# Patient Record
Sex: Male | Born: 1935
Health system: Southern US, Community
[De-identification: ages and names within clinical notes are randomized; demographics above are authoritative.]

## PROBLEM LIST (undated history)

## (undated) DIAGNOSIS — D509 Iron deficiency anemia, unspecified: Secondary | ICD-10-CM

## (undated) DIAGNOSIS — G4730 Sleep apnea, unspecified: Secondary | ICD-10-CM

## (undated) DIAGNOSIS — N2889 Other specified disorders of kidney and ureter: Secondary | ICD-10-CM

## (undated) DIAGNOSIS — K219 Gastro-esophageal reflux disease without esophagitis: Secondary | ICD-10-CM

## (undated) DIAGNOSIS — I272 Pulmonary hypertension, unspecified: Secondary | ICD-10-CM

## (undated) DIAGNOSIS — I509 Heart failure, unspecified: Secondary | ICD-10-CM

## (undated) DIAGNOSIS — I4891 Unspecified atrial fibrillation: Secondary | ICD-10-CM

## (undated) DIAGNOSIS — I10 Essential (primary) hypertension: Secondary | ICD-10-CM

## (undated) DIAGNOSIS — I340 Nonrheumatic mitral (valve) insufficiency: Secondary | ICD-10-CM

## (undated) DIAGNOSIS — I499 Cardiac arrhythmia, unspecified: Secondary | ICD-10-CM

## (undated) DIAGNOSIS — Z5189 Encounter for other specified aftercare: Secondary | ICD-10-CM

## (undated) DIAGNOSIS — H269 Unspecified cataract: Secondary | ICD-10-CM

## (undated) HISTORY — PX: CHOLECYSTECTOMY: SHX55

## (undated) HISTORY — DX: Essential (primary) hypertension: I10

## (undated) HISTORY — DX: Encounter for other specified aftercare: Z51.89

## (undated) HISTORY — DX: Pulmonary hypertension, unspecified: I27.20

## (undated) HISTORY — DX: Unspecified cataract: H26.9

## (undated) HISTORY — PX: EYE SURGERY: SHX253

## (undated) HISTORY — DX: Nonrheumatic mitral (valve) insufficiency: I34.0

## (undated) HISTORY — PX: COLONOSCOPY: SHX174

## (undated) HISTORY — DX: Iron deficiency anemia, unspecified: D50.9

## (undated) HISTORY — PX: CARDIAC CATHETERIZATION: SHX172

## (undated) HISTORY — PX: TONSILLECTOMY: SUR1361

## (undated) HISTORY — DX: Gastro-esophageal reflux disease without esophagitis: K21.9

## (undated) HISTORY — DX: Unspecified atrial fibrillation: I48.91

## (undated) HISTORY — DX: Other specified disorders of kidney and ureter: N28.89

---

## 2000-05-17 HISTORY — PX: NEPHRECTOMY: SHX65

## 2000-06-12 ENCOUNTER — Emergency Department (HOSPITAL_COMMUNITY): Admission: EM | Admit: 2000-06-12 | Discharge: 2000-06-12 | Payer: Self-pay | Admitting: Emergency Medicine

## 2000-08-09 ENCOUNTER — Encounter: Admission: RE | Admit: 2000-08-09 | Discharge: 2000-08-09 | Payer: Self-pay | Admitting: Urology

## 2000-08-09 ENCOUNTER — Encounter: Payer: Self-pay | Admitting: Urology

## 2001-06-28 ENCOUNTER — Ambulatory Visit (HOSPITAL_COMMUNITY): Admission: RE | Admit: 2001-06-28 | Discharge: 2001-06-28 | Payer: Self-pay | Admitting: Gastroenterology

## 2012-01-10 ENCOUNTER — Other Ambulatory Visit (HOSPITAL_COMMUNITY): Payer: Self-pay | Admitting: Internal Medicine

## 2012-01-10 DIAGNOSIS — R062 Wheezing: Secondary | ICD-10-CM

## 2012-01-24 ENCOUNTER — Other Ambulatory Visit (HOSPITAL_COMMUNITY): Payer: Self-pay | Admitting: Internal Medicine

## 2012-01-24 DIAGNOSIS — R062 Wheezing: Secondary | ICD-10-CM

## 2012-01-27 ENCOUNTER — Ambulatory Visit (HOSPITAL_COMMUNITY)
Admission: RE | Admit: 2012-01-27 | Discharge: 2012-01-27 | Disposition: A | Discharge status: Home / Self Care | Payer: Medicare Other | Source: Ambulatory Visit | Attending: Internal Medicine | Admitting: Internal Medicine

## 2012-01-27 DIAGNOSIS — R062 Wheezing: Secondary | ICD-10-CM | POA: Insufficient documentation

## 2012-01-27 MED ORDER — ALBUTEROL SULFATE (5 MG/ML) 0.5% IN NEBU
2.5000 mg | INHALATION_SOLUTION | Freq: Once | RESPIRATORY_TRACT | Status: AC
  Administered 2012-01-27: 2.5 mg via RESPIRATORY_TRACT

## 2012-02-01 ENCOUNTER — Encounter (HOSPITAL_COMMUNITY): Payer: Self-pay

## 2012-03-01 ENCOUNTER — Other Ambulatory Visit: Payer: Self-pay

## 2014-03-06 ENCOUNTER — Other Ambulatory Visit: Payer: Self-pay | Admitting: Dermatology

## 2014-09-30 ENCOUNTER — Ambulatory Visit
Admission: RE | Admit: 2014-09-30 | Discharge: 2014-09-30 | Disposition: A | Discharge status: Home / Self Care | Payer: Medicare Other | Source: Ambulatory Visit | Attending: Family Medicine | Admitting: Family Medicine

## 2014-09-30 ENCOUNTER — Ambulatory Visit (INDEPENDENT_AMBULATORY_CARE_PROVIDER_SITE_OTHER): Payer: Medicare Other | Admitting: Family Medicine

## 2014-09-30 ENCOUNTER — Other Ambulatory Visit: Payer: Self-pay | Admitting: Family Medicine

## 2014-09-30 ENCOUNTER — Encounter: Payer: Self-pay | Admitting: Family Medicine

## 2014-09-30 DIAGNOSIS — M25562 Pain in left knee: Secondary | ICD-10-CM | POA: Diagnosis not present

## 2014-09-30 DIAGNOSIS — M25561 Pain in right knee: Secondary | ICD-10-CM

## 2014-09-30 MED ORDER — METHYLPREDNISOLONE ACETATE 40 MG/ML IJ SUSP
40.0000 mg | Freq: Once | INTRAMUSCULAR | Status: AC
Start: 2014-09-30 — End: 2014-09-30
  Administered 2014-09-30: 40 mg via INTRA_ARTICULAR

## 2014-09-30 MED ORDER — METHYLPREDNISOLONE ACETATE 40 MG/ML IJ SUSP
40.0000 mg | Freq: Once | INTRAMUSCULAR | Status: AC
  Administered 2014-09-30: 40 mg via INTRA_ARTICULAR

## 2014-09-30 NOTE — Progress Notes (Signed)
Patient ID: John Holder, male   DOB: 1931/08/27, 81 y.o.   MRN: [id number]  John Holder - 81 y.o. male MRN [id number]  Date of birth: [birth year]    SUBJECTIVE:     Right knee pain for the last week. Was playing tennis, turn to the right for an overhead and felt a sharp pain in his right knee. Was able to finish the game which was about another 10 minutes. Since then he's not been able to play tennis again secondary to pain. He is also not able to walk his dog. His knee feels stiff, aching, hurts on them medial side. He's had some chronic left knee pain for about a year, sort of a central aching. Does not keep him up at night. He plays tennis 3 or 4 times a week. Nose he cannot play 2 days in a row. Over the last year he feels like his knees are "getting out a little bit" ROS:     No swelling or erythema noted of his knees, no calf pain, fever, sweats, chills, unusual weight change. He has no other chronic arthralgias.  PERTINENT  PMH / PSH FH / / SH:  Past Medical, Surgical, Social, and Family History Reviewed & Updated in the EMR.  Pertinent findings include:  History of left patellar fracture with subsequent surgery about 40 years ago No personal history diabetes mellitus   OBJECTIVE: BP 149/79 mmHg  Pulse 56  Ht 6' (1.829 m)  Wt 175 lb (79.379 kg)  BMI 23.73 kg/m2  Physical Exam:  Vital signs are reviewed. GEN.: Well-developed male no acute distress KNEES: Left knee has a slightly deformed sharp superior border to the kneecap with a well-healed anterior scar. Neither knee has any effusion, no erythema, no warmth. He has full range of motion in flexion extension. He is ligaments intact to varus and valgus stress. Mild crepitus on extension bilaterally right greater than left. Bilaterally the calf is soft. VASCULAR: Dorsalis pedis and posterior tibialis pulses 2+ bilaterally equal NEURO: Intact sensation to soft touch bilateral lower extremities  Ultrasound: Small amount fluid in the  suprapatellar pouch and there is also a small amount of debris noted here. The lateral meniscus appears benign. The medial meniscus is slightly protruding and appears to have a midline tear. The quadricep and patellar tendons are intact.  INJECTION: Patient was given informed consent, signed copy in the chart. Appropriate time out was taken. Area prepped and draped in usual sterile fashion. 1 cc of methylprednisolone 40 mg/ml plus  4 cc of 1% lidocaine without epinephrine was injected into the bilateral knees using a(n) anterior medial approach. The patient tolerated the procedure well. There were no complications. Post procedure instructions were given.   ASSESSMENT & PLAN:  See problem based charting & AVS for pt instructions.

## 2014-09-30 NOTE — Assessment & Plan Note (Signed)
Sounds like he has some chronic OA in the left knee and potentially has meniscal tear or meniscal injury in the right knee. We discussed options today. He's not had any imaging. Given that he still playing tennis multiple times a week and I think he's probably not anywhere near the knee for any type of surgical intervention but I would like to know where we stand so he agreed to get some imaging studies. He also wanted to go ahead and get corticosteroid injections into both knees today. I will send him a note about his x-rays and he'll call week or so and let he know how he is doing from a pain standpoint. We also discussed serial corticosteroid injections into knees no more often than every 3 months.

## 2014-10-04 ENCOUNTER — Encounter: Payer: Self-pay | Admitting: Family Medicine

## 2014-10-09 ENCOUNTER — Telehealth: Payer: Self-pay | Admitting: *Deleted

## 2014-10-09 NOTE — Telephone Encounter (Signed)
Call and read the letter Dr Nori Riis mailed to him and pt was fine with the results of her plan

## 2015-12-08 NOTE — Progress Notes (Signed)
Cancelling order as lab not collected, no need to re-order 

## 2015-12-19 ENCOUNTER — Other Ambulatory Visit: Payer: Self-pay | Admitting: Internal Medicine

## 2015-12-19 DIAGNOSIS — N2889 Other specified disorders of kidney and ureter: Secondary | ICD-10-CM

## 2015-12-19 DIAGNOSIS — K7689 Other specified diseases of liver: Secondary | ICD-10-CM

## 2015-12-22 ENCOUNTER — Ambulatory Visit
Admission: RE | Admit: 2015-12-22 | Discharge: 2015-12-22 | Disposition: A | Discharge status: Home / Self Care | Payer: Medicare Other | Source: Ambulatory Visit | Attending: Internal Medicine | Admitting: Internal Medicine

## 2015-12-22 ENCOUNTER — Other Ambulatory Visit: Payer: Self-pay | Admitting: Internal Medicine

## 2015-12-22 ENCOUNTER — Inpatient Hospital Stay
Admission: RE | Admit: 2015-12-22 | Discharge: 2015-12-22 | Disposition: A | Discharge status: Home / Self Care | Payer: Self-pay | Source: Ambulatory Visit | Attending: Internal Medicine | Admitting: Internal Medicine

## 2015-12-22 DIAGNOSIS — K7689 Other specified diseases of liver: Secondary | ICD-10-CM

## 2015-12-22 DIAGNOSIS — N2889 Other specified disorders of kidney and ureter: Secondary | ICD-10-CM

## 2016-01-07 ENCOUNTER — Ambulatory Visit (HOSPITAL_COMMUNITY)
Admission: RE | Admit: 2016-01-07 | Discharge: 2016-01-07 | Disposition: A | Discharge status: Home / Self Care | Payer: Medicare Other | Source: Ambulatory Visit | Attending: Urology | Admitting: Urology

## 2016-01-07 ENCOUNTER — Other Ambulatory Visit: Payer: Self-pay | Admitting: Urology

## 2016-01-07 DIAGNOSIS — I700 Atherosclerosis of aorta: Secondary | ICD-10-CM | POA: Diagnosis not present

## 2016-01-07 DIAGNOSIS — I517 Cardiomegaly: Secondary | ICD-10-CM | POA: Insufficient documentation

## 2016-01-07 DIAGNOSIS — D49511 Neoplasm of unspecified behavior of right kidney: Secondary | ICD-10-CM

## 2016-01-07 DIAGNOSIS — R938 Abnormal findings on diagnostic imaging of other specified body structures: Secondary | ICD-10-CM | POA: Insufficient documentation

## 2016-01-22 ENCOUNTER — Ambulatory Visit (INDEPENDENT_AMBULATORY_CARE_PROVIDER_SITE_OTHER): Payer: Medicare Other | Admitting: Cardiovascular Disease

## 2016-01-22 ENCOUNTER — Encounter: Payer: Self-pay | Admitting: Cardiovascular Disease

## 2016-01-22 VITALS — BP 128/86 | HR 87 | Ht 72.0 in | Wt 168.0 lb

## 2016-01-22 DIAGNOSIS — I481 Persistent atrial fibrillation: Secondary | ICD-10-CM | POA: Diagnosis not present

## 2016-01-22 DIAGNOSIS — I4819 Other persistent atrial fibrillation: Secondary | ICD-10-CM

## 2016-01-22 NOTE — Progress Notes (Signed)
Cardiology Office Note   Date:  01/22/2016   ID:  John Holder, DOB 04-16-1932, MRN [id number]  PCP:  Geoffery Lyons, MD  Cardiologist:   Mertie Moores, MD   Chief Complaint  Patient presents with  . Atrial Fibrillation   Problem List 1. Atrial fib 2 hyperlipidemia 3. Right abdominal  mass    History of Present Illness: John Holder is a 82 y.o. male who presents for evaluation of his atrial fib. He  was diagnosed with atrial fib at his routine physical  He was found to have atrial fib.  Was started on eliquis and metoprolol XL. He feels much better now that his HR is better . He has been tired for the past 4 months ( has also been caring for his sick wife who has needed around the clock care for months )  Still plays tennis competitively .[name]    TSH was normal Still playing tennis .   former pipe smoker until age 82.    Past Medical History:  Diagnosis Date  . Atrial fibrillation ([place])     No past surgical history on file.   Current Outpatient Prescriptions  Medication Sig Dispense Refill  . ALPRAZolam (XANAX) 0.25 MG tablet Take 0.25 mg by mouth every 6 (six) hours as needed.  1  . ELIQUIS 5 MG TABS tablet Take 5 mg by mouth daily.    [name] glucosamine-chondroitin 500-400 MG tablet Take 1 tablet by mouth 3 (three) times daily.    . metoprolol succinate (TOPROL-XL) 50 MG 24 hr tablet Take 50 mg by mouth daily.    . Multiple Vitamin (MULTIVITAMIN) capsule Take 1 capsule by mouth daily.    [name] omega-3 acid ethyl esters (LOVAZA) 1 G capsule Take 1 g by mouth 2 (two) times daily.    [name] omeprazole (PRILOSEC) 20 MG capsule Take 20 mg by mouth daily.  2  . saw palmetto 160 MG capsule Take 160 mg by mouth 2 (two) times daily.     No current facility-administered medications for this visit.     Allergies:   Review of patient's allergies indicates no known allergies.    Social History:  The patient  reports that he has never smoked. He does not have any smokeless tobacco  history on file. He reports that he has current or past drug history.   Family History:  The patient's family history is not on file.    ROS:  Please see the history of present illness.    Review of Systems: Constitutional:  denies fever, chills, diaphoresis, appetite change and fatigue.  HEENT: denies photophobia, eye pain, redness, hearing loss, ear pain, congestion, sore throat, rhinorrhea, sneezing, neck pain, neck stiffness and tinnitus.  Respiratory: denies SOB, DOE, cough, chest tightness, and wheezing.  Cardiovascular: denies chest pain, palpitations and leg swelling.  Gastrointestinal: denies nausea, vomiting, abdominal pain, diarrhea, constipation, blood in stool.  Genitourinary: denies dysuria, urgency, frequency, hematuria, flank pain and difficulty urinating.  Musculoskeletal: denies  myalgias, back pain, joint swelling, arthralgias and gait problem.   Skin: denies pallor, rash and wound.  Neurological: denies dizziness, seizures, syncope, weakness, light-headedness, numbness and headaches.   Hematological: denies adenopathy, easy bruising, personal or family bleeding history.  Psychiatric/ Behavioral: denies suicidal ideation, mood changes, confusion, nervousness, sleep disturbance and agitation.       All other systems are reviewed and negative.    PHYSICAL EXAM: VS:  BP 128/86 (BP Location: Left Arm, Patient Position: Sitting, Cuff Size: Normal)  Pulse 87   Ht 6' (1.829 m)   Wt 168 lb (76.2 kg)   BMI 22.78 kg/m  , BMI Body mass index is 22.78 kg/m. GEN: Well nourished, well developed, in no acute distress  HEENT: normal  Neck: no JVD, carotid bruits, or masses Cardiac: irreg. Irreg. soft murmurs, rubs, or gallops,no edema  Respiratory:  clear to auscultation bilaterally, normal work of breathing GI: soft, nontender, nondistended, + BS MS: no deformity or atrophy  Skin: warm and dry, no rash Neuro:  Strength and sensation are intact Psych: normal  EKG:   EKG is ordered today. The ekg ordered today demonstrates  Atrial fib with HR of 87.   INc. RBBB.    Recent Labs: No results found for requested labs within last 8760 hours.    Lipid Panel No results found for: CHOL, TRIG, HDL, CHOLHDL, VLDL, LDLCALC, LDLDIRECT    Wt Readings from Last 3 Encounters:  01/22/16 168 lb (76.2 kg)  09/30/14 175 lb (79.4 kg)      Other studies Reviewed: Additional studies/ records that were reviewed today include:  Records from Dr. Reynaldo Minium. Pt was found to have atrial fib diagnosed in [facility] Aug, 2017. Echo from 2012 Rollene Fare ) normal LV function EF 55%. LA was mildly dilated , mod. MR , mild TR Mild pulm. HTN with PA pressures between 30-40.    ASSESSMENT AND PLAN:  1.atrial fibrillation: John Holder presents today for further evaluation and management of his atrial fibrillation. This was recently diagnosed 1 month ago. He is basically asymptomatic. He thinks he might be a little fatigued over the past 4 months but he also has been the primary caregiver for his wife who has been quite ill.    discussed various treatment options including rate control and anticoagulation and also discussed a more aggressive approach. He is not interested in multiple antiarrhythmics with multiple cardioversions.   Current medicines are reviewed at length with the patient today.  The patient does not have concerns regarding medicines.  Labs/ tests ordered today include:  No orders of the defined types were placed in this encounter.   Disposition:   FU with me in 6 months     Mertie Moores, MD  01/22/2016 12:08 PM    [address], [place], [place]  [address] Phone: [phone]; Fax: [phone]   [facility]  [address] [place], [place]  [address] [phone]   Fax [phone]

## 2016-01-22 NOTE — Patient Instructions (Addendum)
Medication Instructions:  Your physician recommends that you continue on your current medications as directed. Please refer to the Current Medication list given to you today.   Labwork: Your physician recommends that you return for lab work in: 2 weeks on Thursday Sept. 14   Testing/Procedures: Your physician has requested that you have an echocardiogram. Echocardiography is a painless test that uses sound waves to create images of your heart. It provides your doctor with information about the size and shape of your heart and how well your heart's chambers and valves are working. This procedure takes approximately one hour. There are no restrictions for this procedure.  Your physician has recommended that you have a Cardioversion (DCCV). Electrical Cardioversion uses a jolt of electricity to your heart either through paddles or wired patches attached to your chest. This is a controlled, usually prescheduled, procedure. Defibrillation is done under light anesthesia in the hospital, and you usually go home the day of the procedure. This is done to get your heart back into a normal rhythm. You are not awake for the procedure. Please see the instruction sheet given to you today.    Follow-Up: Your physician wants you to follow-up in: 6 months with Dr. Acie Fredrickson.  You will receive a reminder letter in the mail two months in advance. If you don't receive a letter, please call our office to schedule the follow-up appointment.   If you need a refill on your cardiac medications before your next appointment, please call your pharmacy.   Thank you for choosing [facility]! Christen Bame, RN [phone]

## 2016-01-29 ENCOUNTER — Other Ambulatory Visit: Payer: Medicare Other | Admitting: *Deleted

## 2016-01-29 ENCOUNTER — Other Ambulatory Visit: Payer: Self-pay

## 2016-01-29 DIAGNOSIS — I4819 Other persistent atrial fibrillation: Secondary | ICD-10-CM

## 2016-01-29 LAB — BASIC METABOLIC PANEL
BUN: 19 mg/dL (ref 7–25)
CHLORIDE: 106 mmol/L (ref 98–110)
CO2: 25 mmol/L (ref 20–31)
Calcium: 8.8 mg/dL (ref 8.6–10.3)
Creat: 1.1 mg/dL (ref 0.70–1.11)
GLUCOSE: 119 mg/dL — AB (ref 65–99)
POTASSIUM: 4.3 mmol/L (ref 3.5–5.3)
SODIUM: 140 mmol/L (ref 135–146)

## 2016-01-29 LAB — CBC WITH DIFFERENTIAL/PLATELET
BASOS PCT: 0 %
Basophils Absolute: 0 cells/uL (ref 0–200)
EOS ABS: 93 {cells}/uL (ref 15–500)
Eosinophils Relative: 3 %
HEMATOCRIT: 42.3 % (ref 38.5–50.0)
Hemoglobin: 14.1 g/dL (ref 13.2–17.1)
LYMPHS PCT: 30 %
Lymphs Abs: 930 cells/uL (ref 850–3900)
MCH: 28.6 pg (ref 27.0–33.0)
MCHC: 33.3 g/dL (ref 32.0–36.0)
MCV: 85.8 fL (ref 80.0–100.0)
MONO ABS: 341 {cells}/uL (ref 200–950)
MPV: 9.4 fL (ref 7.5–12.5)
Monocytes Relative: 11 %
NEUTROS ABS: 1736 {cells}/uL (ref 1500–7800)
Neutrophils Relative %: 56 %
Platelets: 143 10*3/uL (ref 140–400)
RBC: 4.93 MIL/uL (ref 4.20–5.80)
RDW: 13.8 % (ref 11.0–15.0)
WBC: 3.1 10*3/uL — ABNORMAL LOW (ref 3.8–10.8)

## 2016-01-29 LAB — PROTIME-INR
INR: 1.1
Prothrombin Time: 12 s — ABNORMAL HIGH (ref 9.0–11.5)

## 2016-02-02 ENCOUNTER — Other Ambulatory Visit: Payer: Medicare Other | Admitting: *Deleted

## 2016-02-02 ENCOUNTER — Telehealth: Payer: Self-pay | Admitting: Nurse Practitioner

## 2016-02-02 ENCOUNTER — Encounter: Payer: Self-pay | Admitting: Nurse Practitioner

## 2016-02-02 DIAGNOSIS — I4819 Other persistent atrial fibrillation: Secondary | ICD-10-CM

## 2016-02-02 LAB — CBC WITH DIFFERENTIAL/PLATELET
BASOS PCT: 0 %
Basophils Absolute: 0 cells/uL (ref 0–200)
EOS ABS: 82 {cells}/uL (ref 15–500)
Eosinophils Relative: 2 %
HEMATOCRIT: 41.4 % (ref 38.5–50.0)
HEMOGLOBIN: 13.7 g/dL (ref 13.2–17.1)
Lymphocytes Relative: 27 %
Lymphs Abs: 1107 cells/uL (ref 850–3900)
MCH: 28.7 pg (ref 27.0–33.0)
MCHC: 33.1 g/dL (ref 32.0–36.0)
MCV: 86.6 fL (ref 80.0–100.0)
MONO ABS: 533 {cells}/uL (ref 200–950)
MPV: 9.8 fL (ref 7.5–12.5)
Monocytes Relative: 13 %
NEUTROS ABS: 2378 {cells}/uL (ref 1500–7800)
Neutrophils Relative %: 58 %
Platelets: 141 10*3/uL (ref 140–400)
RBC: 4.78 MIL/uL (ref 4.20–5.80)
RDW: 14.3 % (ref 11.0–15.0)
WBC: 4.1 10*3/uL (ref 3.8–10.8)

## 2016-02-02 NOTE — Telephone Encounter (Signed)
Called patient to verify date and time of DCCV with Dr. Acie Fredrickson.  Patient is scheduled for DCCV on Wed. 9/20 at 2:00 pm with Dr. Acie Fredrickson.  I reviewed pre-procedure instructions with him and advised that I will place a copy in the lab to pick up when he comes in for lab work scheduled today.  He verbalized understanding and agreement with plan of care and thanked me for the call.

## 2016-02-03 ENCOUNTER — Other Ambulatory Visit: Payer: Self-pay | Admitting: Cardiovascular Disease

## 2016-02-03 LAB — BASIC METABOLIC PANEL
BUN: 21 mg/dL (ref 7–25)
CHLORIDE: 106 mmol/L (ref 98–110)
CO2: 24 mmol/L (ref 20–31)
CREATININE: 1.01 mg/dL (ref 0.70–1.11)
Calcium: 8.8 mg/dL (ref 8.6–10.3)
GLUCOSE: 96 mg/dL (ref 65–99)
Potassium: 4.5 mmol/L (ref 3.5–5.3)
Sodium: 140 mmol/L (ref 135–146)

## 2016-02-03 LAB — PROTIME-INR
INR: 1.1
Prothrombin Time: 12 s — ABNORMAL HIGH (ref 9.0–11.5)

## 2016-02-04 ENCOUNTER — Ambulatory Visit (HOSPITAL_COMMUNITY)
Admission: RE | Admit: 2016-02-04 | Discharge: 2016-02-04 | Disposition: A | Discharge status: Home / Self Care | Payer: Medicare Other | Source: Ambulatory Visit | Attending: Cardiovascular Disease | Admitting: Cardiovascular Disease

## 2016-02-04 ENCOUNTER — Ambulatory Visit (HOSPITAL_COMMUNITY): Payer: Medicare Other | Admitting: Certified Registered Nurse Anesthetist

## 2016-02-04 ENCOUNTER — Encounter (HOSPITAL_COMMUNITY)
Admission: RE | Disposition: A | Discharge status: Home / Self Care | Payer: Self-pay | Source: Ambulatory Visit | Attending: Cardiovascular Disease

## 2016-02-04 ENCOUNTER — Encounter: Payer: Self-pay | Admitting: Cardiovascular Disease

## 2016-02-04 DIAGNOSIS — Z87891 Personal history of nicotine dependence: Secondary | ICD-10-CM | POA: Insufficient documentation

## 2016-02-04 DIAGNOSIS — E785 Hyperlipidemia, unspecified: Secondary | ICD-10-CM | POA: Diagnosis not present

## 2016-02-04 DIAGNOSIS — Z7901 Long term (current) use of anticoagulants: Secondary | ICD-10-CM | POA: Diagnosis not present

## 2016-02-04 DIAGNOSIS — I4891 Unspecified atrial fibrillation: Secondary | ICD-10-CM | POA: Diagnosis not present

## 2016-02-04 HISTORY — PX: CARDIOVERSION: SHX1299

## 2016-02-04 SURGERY — CARDIOVERSION
Anesthesia: Monitor Anesthesia Care

## 2016-02-04 MED ORDER — PROPOFOL 10 MG/ML IV BOLUS
INTRAVENOUS | Status: DC | PRN
  Administered 2016-02-04: 80 mg via INTRAVENOUS

## 2016-02-04 MED ORDER — SODIUM CHLORIDE 0.9 % IV SOLN
INTRAVENOUS | Status: DC
  Administered 2016-02-04 (×2): via INTRAVENOUS

## 2016-02-04 MED ORDER — LIDOCAINE HCL (CARDIAC) 20 MG/ML IV SOLN
INTRAVENOUS | Status: DC | PRN
  Administered 2016-02-04: 40 mg via INTRATRACHEAL

## 2016-02-04 NOTE — CV Procedure (Signed)
    Cardioversion Note  John Holder [id number] [birth year]  Procedure: DC Cardioversion Indications: Atrial fib   Procedure Details Consent: Obtained Time Out: Verified patient identification, verified procedure, site/side was marked, verified correct patient position, special equipment/implants available, Radiology Safety Procedures followed,  medications/allergies/relevent history reviewed, required imaging and test results available.  Performed  The patient has been on adequate anticoagulation.  The patient received IV Lidocaine 40 mg followed by Propofol 80 mg IV  for sedation.  Synchronous cardioversion was performed at 120,200, 200  joules.  The cardioversion was unsuccessful     Complications: No apparent complications Patient did tolerate procedure well.   Thayer Headings, Brooke Bonito., MD, [facility] 02/04/2016, 2:02 PM

## 2016-02-04 NOTE — Transfer of Care (Signed)
Immediate Anesthesia Transfer of Care Note  Patient: John Holder  Procedure(s) Performed: Procedure(s): CARDIOVERSION (N/A)  Patient Location: Endoscopy Unit  Anesthesia Type:MAC  Level of Consciousness: awake, alert  and oriented  Airway & Oxygen Therapy: Patient Spontanous Breathing and Patient connected to nasal cannula oxygen  Post-op Assessment: Report given to RN, Post -op Vital signs reviewed and stable and Patient moving all extremities  Post vital signs: Reviewed and stable  Last Vitals:  Vitals:   02/04/16 1327  BP: (!) 140/97  Pulse: 78  Resp: 17  Temp: 36.8 C    Last Pain:  Vitals:   02/04/16 1327  TempSrc: Oral         Complications: No apparent anesthesia complications

## 2016-02-04 NOTE — Anesthesia Postprocedure Evaluation (Signed)
Anesthesia Post Note  Patient: John Holder  Procedure(s) Performed: Procedure(s) (LRB): CARDIOVERSION (N/A)  Patient location during evaluation: PACU Anesthesia Type: General Level of consciousness: awake and alert Pain management: pain level controlled Vital Signs Assessment: post-procedure vital signs reviewed and stable Respiratory status: spontaneous breathing, nonlabored ventilation, respiratory function stable and patient connected to nasal cannula oxygen Cardiovascular status: blood pressure returned to baseline and stable Postop Assessment: no signs of nausea or vomiting Anesthetic complications: no    Last Vitals:  Vitals:   02/04/16 1415 02/04/16 1420  BP: 126/74 (!) 142/80  Pulse: (!) 49   Resp: 16   Temp:      Last Pain:  Vitals:   02/04/16 1420  TempSrc:   PainSc: (P) 5                  Tiajuana Amass

## 2016-02-04 NOTE — Anesthesia Preprocedure Evaluation (Signed)
Anesthesia Evaluation  Patient identified by MRN, date of birth, ID band Patient awake    Reviewed: Allergy & Precautions, NPO status , Patient's Chart, lab work & pertinent test results  Airway Mallampati: I  TM Distance: >3 FB Neck ROM: Full    Dental no notable dental hx.    Pulmonary    Pulmonary exam normal        Cardiovascular + dysrhythmias Atrial Fibrillation  Rhythm:Irregular     Neuro/Psych    GI/Hepatic   Endo/Other    Renal/GU      Musculoskeletal   Abdominal Normal abdominal exam  (+)   Peds  Hematology   Anesthesia Other Findings   Reproductive/Obstetrics                             Anesthesia Physical Anesthesia Plan  ASA: III  Anesthesia Plan: MAC   Post-op Pain Management:    Induction: Intravenous  Airway Management Planned: Simple Face Mask  Additional Equipment:   Intra-op Plan:   Post-operative Plan:   Informed Consent: I have reviewed the patients History and Physical, chart, labs and discussed the procedure including the risks, benefits and alternatives for the proposed anesthesia with the patient or authorized representative who has indicated his/her understanding and acceptance.   Dental advisory given  Plan Discussed with: Anesthesiologist and CRNA  Anesthesia Plan Comments:         Anesthesia Quick Evaluation

## 2016-02-04 NOTE — Discharge Instructions (Signed)
Monitored Anesthesia Care °Monitored anesthesia care is an anesthesia service for a medical procedure. Anesthesia is the loss of the ability to feel pain. It is produced by medicines called anesthetics. It may affect a small area of your body (local anesthesia), a large area of your body (regional anesthesia), or your entire body (general anesthesia). The need for monitored anesthesia care depends your procedure, your condition, and the potential need for regional or general anesthesia. It is often provided during procedures where:  °· General anesthesia may be needed if there are complications. This is because you need special care when you are under general anesthesia.   °· You will be under local or regional anesthesia. This is so that you are able to have higher levels of anesthesia if needed.   °· You will receive calming medicines (sedatives). This is especially the case if sedatives are given to put you in a semi-conscious state of relaxation (deep sedation). This is because the amount of sedative needed to produce this state can be hard to predict. Too much of a sedative can produce general anesthesia. °Monitored anesthesia care is performed by one or more health care providers who have special training in all types of anesthesia. You will need to meet with these health care providers before your procedure. During this meeting, they will ask you about your medical history. They will also give you instructions to follow. (For example, you will need to stop eating and drinking before your procedure. You may also need to stop or change medicines you are taking.) During your procedure, your health care providers will stay with you. They will:  °· Watch your condition. This includes watching your blood pressure, breathing, and level of pain.   °· Diagnose and treat problems that occur.   °· Give medicines if they are needed. These may include calming medicines (sedatives) and anesthetics.   °· Make sure you are  comfortable.   °Having monitored anesthesia care does not necessarily mean that you will be under anesthesia. It does mean that your health care providers will be able to manage anesthesia if you need it or if it occurs. It also means that you will be able to have a different type of anesthesia than you are having if you need it. When your procedure is complete, your health care providers will continue to watch your condition. They will make sure any medicines wear off before you are allowed to go home.  °  °This information is not intended to replace advice given to you by your health care provider. Make sure you discuss any questions you have with your health care provider. °  °Document Released: 01/27/2005 Document Revised: 05/24/2014 Document Reviewed: 06/14/2012 °Elsevier Interactive Patient Education ©2016 [employer] °Electrical Cardioversion, Care After °Refer to this sheet in the next few weeks. These instructions provide you with information on caring for yourself after your procedure. Your health care provider may also give you more specific instructions. Your treatment has been planned according to current medical practices, but problems sometimes occur. Call your health care provider if you have any problems or questions after your procedure. °WHAT TO EXPECT AFTER THE PROCEDURE °After your procedure, it is typical to have the following sensations: °· Some redness on the skin where the shocks were delivered. If this is tender, a sunburn lotion or hydrocortisone cream may help. °· Possible return of an abnormal heart rhythm within hours or days after the procedure. °HOME CARE INSTRUCTIONS °· Take medicines only as directed by your health care provider.   Be sure you understand how and when to take your medicine. °· Learn how to feel your pulse and check it often. °· Limit your activity for 48 hours after the procedure or as directed by your health care provider. °· Avoid or minimize caffeine and other  stimulants as directed by your health care provider. °SEEK MEDICAL CARE IF: °· You feel like your heart is beating too fast or your pulse is not regular. °· You have any questions about your medicines. °· You have bleeding that will not stop. °SEEK IMMEDIATE MEDICAL CARE IF: °· You are dizzy or feel faint. °· It is hard to breathe or you feel short of breath. °· There is a change in discomfort in your chest. °· Your speech is slurred or you have trouble moving an arm or leg on one side of your body. °· You get a serious muscle cramp that does not go away. °· Your fingers or toes turn cold or blue. °  °This information is not intended to replace advice given to you by your health care provider. Make sure you discuss any questions you have with your health care provider. °  °Document Released: 02/21/2013 Document Revised: 05/24/2014 Document Reviewed: 02/21/2013 °Elsevier Interactive Patient Education ©2016 [employer] ° °

## 2016-02-04 NOTE — Anesthesia Procedure Notes (Signed)
Procedure Name: MAC Date/Time: 02/04/2016 1:46 PM Performed by: Trixie Deis A Pre-anesthesia Checklist: Patient identified, Emergency Drugs available, Suction available, Patient being monitored and Timeout performed Patient Re-evaluated:Patient Re-evaluated prior to inductionOxygen Delivery Method: Ambu bag Preoxygenation: Pre-oxygenation with 100% oxygen

## 2016-02-05 ENCOUNTER — Other Ambulatory Visit (HOSPITAL_COMMUNITY): Payer: Self-pay

## 2016-02-05 NOTE — H&P (View-Only) (Signed)
Cardiology Office Note   Date:  01/22/2016   ID:  John Holder, DOB Jul 11, 1931, MRN [id number]  PCP:  Geoffery Lyons, MD  Cardiologist:   Mertie Moores, MD   Chief Complaint  Patient presents with  . Atrial Fibrillation   Problem List 1. Atrial fib 2 hyperlipidemia 3. Right abdominal  mass    History of Present Illness: John Holder is a 82 y.o. male who presents for evaluation of his atrial fib. He  was diagnosed with atrial fib at his routine physical  He was found to have atrial fib.  Was started on eliquis and metoprolol XL. He feels much better now that his HR is better . He has been tired for the past 4 months ( has also been caring for his sick wife who has needed around the clock care for months )  Still plays tennis competitively .[name]    TSH was normal Still playing tennis .   former pipe smoker until age 31.    Past Medical History:  Diagnosis Date  . Atrial fibrillation ([place])     No past surgical history on file.   Current Outpatient Prescriptions  Medication Sig Dispense Refill  . ALPRAZolam (XANAX) 0.25 MG tablet Take 0.25 mg by mouth every 6 (six) hours as needed.  1  . ELIQUIS 5 MG TABS tablet Take 5 mg by mouth daily.    [name] glucosamine-chondroitin 500-400 MG tablet Take 1 tablet by mouth 3 (three) times daily.    . metoprolol succinate (TOPROL-XL) 50 MG 24 hr tablet Take 50 mg by mouth daily.    . Multiple Vitamin (MULTIVITAMIN) capsule Take 1 capsule by mouth daily.    [name] omega-3 acid ethyl esters (LOVAZA) 1 G capsule Take 1 g by mouth 2 (two) times daily.    [name] omeprazole (PRILOSEC) 20 MG capsule Take 20 mg by mouth daily.  2  . saw palmetto 160 MG capsule Take 160 mg by mouth 2 (two) times daily.     No current facility-administered medications for this visit.     Allergies:   Review of patient's allergies indicates no known allergies.    Social History:  The patient  reports that he has never smoked. He does not have any smokeless tobacco  history on file. He reports that he has current or past drug history.   Family History:  The patient's family history is not on file.    ROS:  Please see the history of present illness.    Review of Systems: Constitutional:  denies fever, chills, diaphoresis, appetite change and fatigue.  HEENT: denies photophobia, eye pain, redness, hearing loss, ear pain, congestion, sore throat, rhinorrhea, sneezing, neck pain, neck stiffness and tinnitus.  Respiratory: denies SOB, DOE, cough, chest tightness, and wheezing.  Cardiovascular: denies chest pain, palpitations and leg swelling.  Gastrointestinal: denies nausea, vomiting, abdominal pain, diarrhea, constipation, blood in stool.  Genitourinary: denies dysuria, urgency, frequency, hematuria, flank pain and difficulty urinating.  Musculoskeletal: denies  myalgias, back pain, joint swelling, arthralgias and gait problem.   Skin: denies pallor, rash and wound.  Neurological: denies dizziness, seizures, syncope, weakness, light-headedness, numbness and headaches.   Hematological: denies adenopathy, easy bruising, personal or family bleeding history.  Psychiatric/ Behavioral: denies suicidal ideation, mood changes, confusion, nervousness, sleep disturbance and agitation.       All other systems are reviewed and negative.    PHYSICAL EXAM: VS:  BP 128/86 (BP Location: Left Arm, Patient Position: Sitting, Cuff Size: Normal)  Pulse 87   Ht 6' (1.829 m)   Wt 168 lb (76.2 kg)   BMI 22.78 kg/m  , BMI Body mass index is 22.78 kg/m. GEN: Well nourished, well developed, in no acute distress  HEENT: normal  Neck: no JVD, carotid bruits, or masses Cardiac: irreg. Irreg. soft murmurs, rubs, or gallops,no edema  Respiratory:  clear to auscultation bilaterally, normal work of breathing GI: soft, nontender, nondistended, + BS MS: no deformity or atrophy  Skin: warm and dry, no rash Neuro:  Strength and sensation are intact Psych: normal  EKG:   EKG is ordered today. The ekg ordered today demonstrates  Atrial fib with HR of 87.   INc. RBBB.    Recent Labs: No results found for requested labs within last 8760 hours.    Lipid Panel No results found for: CHOL, TRIG, HDL, CHOLHDL, VLDL, LDLCALC, LDLDIRECT    Wt Readings from Last 3 Encounters:  01/22/16 168 lb (76.2 kg)  09/30/14 175 lb (79.4 kg)      Other studies Reviewed: Additional studies/ records that were reviewed today include:  Records from Dr. Reynaldo Minium. Pt was found to have atrial fib diagnosed in [facility] Aug, 2017. Echo from 2012 Rollene Fare ) normal LV function EF 55%. LA was mildly dilated , mod. MR , mild TR Mild pulm. HTN with PA pressures between 30-40.    ASSESSMENT AND PLAN:  1.atrial fibrillation: John Holder presents today for further evaluation and management of his atrial fibrillation. This was recently diagnosed 1 month ago. He is basically asymptomatic. He thinks he might be a little fatigued over the past 4 months but he also has been the primary caregiver for his wife who has been quite ill.    discussed various treatment options including rate control and anticoagulation and also discussed a more aggressive approach. He is not interested in multiple antiarrhythmics with multiple cardioversions.   Current medicines are reviewed at length with the patient today.  The patient does not have concerns regarding medicines.  Labs/ tests ordered today include:  No orders of the defined types were placed in this encounter.   Disposition:   FU with me in 6 months     Mertie Moores, MD  01/22/2016 12:08 PM    [address], [place], [place]  [address] Phone: [phone]; Fax: [phone]   [facility]  [address] [place], [place]  [address] [phone]   Fax [phone]

## 2016-02-05 NOTE — Interval H&P Note (Signed)
History and Physical Interval Note:  02/05/2016 5:55 PM  John Holder  has presented today for surgery, with the diagnosis of afib  The various methods of treatment have been discussed with the patient and family. After consideration of risks, benefits and other options for treatment, the patient has consented to  Procedure(s): CARDIOVERSION (N/A) as a surgical intervention .  The patient's history has been reviewed, patient examined, no change in status, stable for surgery.  I have reviewed the patient's chart and labs.  Questions were answered to the patient's satisfaction.     Mertie Moores

## 2016-02-18 ENCOUNTER — Ambulatory Visit: Payer: Self-pay | Admitting: Cardiovascular Disease

## 2016-02-18 ENCOUNTER — Other Ambulatory Visit: Payer: Self-pay

## 2016-02-18 ENCOUNTER — Ambulatory Visit (HOSPITAL_COMMUNITY): Payer: Medicare Other | Attending: Cardiovascular Disease

## 2016-02-18 DIAGNOSIS — I4891 Unspecified atrial fibrillation: Secondary | ICD-10-CM | POA: Diagnosis present

## 2016-02-18 DIAGNOSIS — I071 Rheumatic tricuspid insufficiency: Secondary | ICD-10-CM | POA: Insufficient documentation

## 2016-02-18 DIAGNOSIS — E785 Hyperlipidemia, unspecified: Secondary | ICD-10-CM | POA: Diagnosis not present

## 2016-02-18 DIAGNOSIS — Z87891 Personal history of nicotine dependence: Secondary | ICD-10-CM | POA: Insufficient documentation

## 2016-02-18 DIAGNOSIS — I4819 Other persistent atrial fibrillation: Secondary | ICD-10-CM

## 2016-02-18 DIAGNOSIS — I481 Persistent atrial fibrillation: Secondary | ICD-10-CM | POA: Diagnosis not present

## 2016-02-18 DIAGNOSIS — I340 Nonrheumatic mitral (valve) insufficiency: Secondary | ICD-10-CM | POA: Diagnosis not present

## 2016-02-18 DIAGNOSIS — I517 Cardiomegaly: Secondary | ICD-10-CM | POA: Diagnosis not present

## 2016-02-19 ENCOUNTER — Telehealth: Payer: Self-pay | Admitting: Cardiovascular Disease

## 2016-02-19 NOTE — Telephone Encounter (Signed)
Follow Up:; ° ° °Returning your call. °

## 2016-02-19 NOTE — Telephone Encounter (Signed)
Results of echocardiogram reviewed with patient who verbalized understanding and thanked me for the call

## 2016-03-08 ENCOUNTER — Other Ambulatory Visit (HOSPITAL_COMMUNITY): Payer: Self-pay | Admitting: Urology

## 2016-03-08 DIAGNOSIS — D4101 Neoplasm of uncertain behavior of right kidney: Secondary | ICD-10-CM

## 2016-03-08 DIAGNOSIS — D49511 Neoplasm of unspecified behavior of right kidney: Secondary | ICD-10-CM

## 2016-03-29 ENCOUNTER — Ambulatory Visit (HOSPITAL_COMMUNITY)
Admission: RE | Admit: 2016-03-29 | Discharge: 2016-03-29 | Disposition: A | Discharge status: Home / Self Care | Payer: Medicare Other | Source: Ambulatory Visit | Attending: Urology | Admitting: Urology

## 2016-03-29 DIAGNOSIS — K769 Liver disease, unspecified: Secondary | ICD-10-CM | POA: Insufficient documentation

## 2016-03-29 DIAGNOSIS — D49511 Neoplasm of unspecified behavior of right kidney: Secondary | ICD-10-CM | POA: Diagnosis present

## 2016-03-29 DIAGNOSIS — D4101 Neoplasm of uncertain behavior of right kidney: Secondary | ICD-10-CM

## 2016-03-29 LAB — POCT I-STAT CREATININE: Creatinine, Ser: 1.2 mg/dL (ref 0.61–1.24)

## 2016-03-29 MED ORDER — GADOBENATE DIMEGLUMINE 529 MG/ML IV SOLN
15.0000 mL | Freq: Once | INTRAVENOUS | Status: AC | PRN
  Administered 2016-03-29: 15 mL via INTRAVENOUS

## 2016-04-19 ENCOUNTER — Telehealth: Payer: Self-pay | Admitting: Cardiovascular Disease

## 2016-04-19 NOTE — Telephone Encounter (Signed)
John Holder is calling because he has a small blood vessel burst in his  eye and is on Eliquis 5mg  twice a day and is wanting to know should he stop taking the medication , because of this bleeding in his eye . Please call   Thanks

## 2016-04-19 NOTE — Telephone Encounter (Signed)
I think his eye will heal up even if he stays on the Eliquis. He may hold it 2 days and see if that helps

## 2016-04-19 NOTE — Telephone Encounter (Signed)
Left detailed message on patient's personal voice mail with Dr. Elmarie Shiley advice and advised him to call back with any additional questions or concerns.

## 2016-04-19 NOTE — Telephone Encounter (Signed)
Spoke with patient who states a couple of times per year he will have capillaries in his eye to burst.  He has seen an optometrist for this concern in the past and was told that this would dissipate on its own.  He states this is the first occurrence since starting Eliquis 5 mg bid.  He states it is not affecting his vision but wanted to ask if he should d/c eliquis for a while until bleeding dissipates.  I advised I will forward to Dr. Acie Fredrickson for advice and call him back.  He verbalized understanding and agreement.

## 2016-07-16 ENCOUNTER — Encounter: Payer: Self-pay | Admitting: Cardiovascular Disease

## 2016-07-25 NOTE — Progress Notes (Deleted)
Cardiology Office Note   Date:  07/25/2016   ID:  John Holder, DOB 1932-05-01, MRN [id number]  PCP:  Geoffery Lyons, MD  Cardiologist:   Mertie Moores, MD   No chief complaint on file.  Problem List 1. Atrial fib 2 hyperlipidemia 3. Right abdominal  mass    History of Present Illness: John Holder is a 83 y.o. male who presents for evaluation of his atrial fib. He  was diagnosed with atrial fib at his routine physical  He was found to have atrial fib.  Was started on eliquis and metoprolol XL. He feels much better now that his HR is better . He has been tired for the past 4 months ( has also been caring for his sick wife who has needed around the clock care for months )  Still plays tennis competitively .[name]    TSH was normal Still playing tennis .   former pipe smoker until age 56.   July 26, 2016: John Holder is seen today for follow up visit  For his atrial fib.  His wife John Holder passed away recently .    Past Medical History:  Diagnosis Date  . Atrial fibrillation [facility])     Past Surgical History:  Procedure Laterality Date  . CARDIOVERSION N/A 02/04/2016   Procedure: CARDIOVERSION;  Surgeon: Thayer Headings, MD;  Location: [place];  Service: Cardiovascular;  Laterality: N/A;     Current Outpatient Prescriptions  Medication Sig Dispense Refill  . ALPRAZolam (XANAX) 0.25 MG tablet Take 0.25 mg by mouth every 6 (six) hours as needed.  1  . ELIQUIS 5 MG TABS tablet Take 5 mg by mouth daily.    [name] glucosamine-chondroitin 500-400 MG tablet Take 1 tablet by mouth 3 (three) times daily.    . metoprolol succinate (TOPROL-XL) 50 MG 24 hr tablet Take 50 mg by mouth daily.    . Multiple Vitamin (MULTIVITAMIN) capsule Take 1 capsule by mouth daily.    [name] omega-3 acid ethyl esters (LOVAZA) 1 G capsule Take 1 g by mouth 2 (two) times daily.    [name] omeprazole (PRILOSEC) 20 MG capsule Take 20 mg by mouth daily.  2  . saw palmetto 160 MG capsule Take 160 mg by mouth 2 (two)  times daily.     No current facility-administered medications for this visit.     Allergies:   Patient has no known allergies.    Social History:  The patient  reports that he has never smoked. He has never used smokeless tobacco. He reports that he does not drink alcohol or use drugs.   Family History:  The patient's family history is not on file.    ROS:  Please see the history of present illness.    Review of Systems: Constitutional:  denies fever, chills, diaphoresis, appetite change and fatigue.  HEENT: denies photophobia, eye pain, redness, hearing loss, ear pain, congestion, sore throat, rhinorrhea, sneezing, neck pain, neck stiffness and tinnitus.  Respiratory: denies SOB, DOE, cough, chest tightness, and wheezing.  Cardiovascular: denies chest pain, palpitations and leg swelling.  Gastrointestinal: denies nausea, vomiting, abdominal pain, diarrhea, constipation, blood in stool.  Genitourinary: denies dysuria, urgency, frequency, hematuria, flank pain and difficulty urinating.  Musculoskeletal: denies  myalgias, back pain, joint swelling, arthralgias and gait problem.   Skin: denies pallor, rash and wound.  Neurological: denies dizziness, seizures, syncope, weakness, light-headedness, numbness and headaches.   Hematological: denies adenopathy, easy bruising, personal or family bleeding history.  Psychiatric/ Behavioral: denies  suicidal ideation, mood changes, confusion, nervousness, sleep disturbance and agitation.       All other systems are reviewed and negative.    PHYSICAL EXAM: VS:  There were no vitals taken for this visit. , BMI There is no height or weight on file to calculate BMI. GEN: Well nourished, well developed, in no acute distress  HEENT: normal  Neck: no JVD, carotid bruits, or masses Cardiac: irreg. Irreg. soft murmurs, rubs, or gallops,no edema  Respiratory:  clear to auscultation bilaterally, normal work of breathing GI: soft, nontender,  nondistended, + BS MS: no deformity or atrophy  Skin: warm and dry, no rash Neuro:  Strength and sensation are intact Psych: normal  EKG:  EKG is ordered today. The ekg ordered today demonstrates  Atrial fib with HR of 87.   INc. RBBB.    Recent Labs: 02/02/2016: BUN 21; Hemoglobin 13.7; Platelets 141; Potassium 4.5; Sodium 140 03/29/2016: Creatinine, Ser 1.20    Lipid Panel No results found for: CHOL, TRIG, HDL, CHOLHDL, VLDL, LDLCALC, LDLDIRECT    Wt Readings from Last 3 Encounters:  02/04/16 168 lb (76.2 kg)  01/22/16 168 lb (76.2 kg)  09/30/14 175 lb (79.4 kg)      Other studies Reviewed: Additional studies/ records that were reviewed today include:  Records from Dr. Reynaldo Minium. Pt was found to have atrial fib diagnosed in [facility] Aug, 2017. Echo from 2012 Rollene Fare ) normal LV function EF 55%. LA was mildly dilated , mod. MR , mild TR Mild pulm. HTN with PA pressures between 30-40.    ASSESSMENT AND PLAN:  1.atrial fibrillation: John Holder presents today for further evaluation and management of his atrial fibrillation. This was recently diagnosed 1 month ago. He is basically asymptomatic. He thinks he might be a little fatigued over the past 4 months but he also has been the primary caregiver for his wife who has been quite ill.    discussed various treatment options including rate control and anticoagulation and also discussed a more aggressive approach. He is not interested in multiple antiarrhythmics with multiple cardioversions.   Current medicines are reviewed at length with the patient today.  The patient does not have concerns regarding medicines.  Labs/ tests ordered today include:  No orders of the defined types were placed in this encounter.   Disposition:   FU with me in 6 months     Mertie Moores, MD  07/25/2016 6:50 PM    [facility] [address], [place], [place]  [address] Phone: [phone]; Fax: [phone]

## 2016-07-26 ENCOUNTER — Ambulatory Visit: Payer: Medicare Other | Admitting: Cardiovascular Disease

## 2016-07-28 ENCOUNTER — Emergency Department (HOSPITAL_COMMUNITY): Payer: Medicare Other

## 2016-07-28 ENCOUNTER — Encounter (HOSPITAL_COMMUNITY): Payer: Self-pay | Admitting: Emergency Medicine

## 2016-07-28 ENCOUNTER — Emergency Department (HOSPITAL_COMMUNITY)
Admission: EM | Admit: 2016-07-28 | Discharge: 2016-07-28 | Disposition: A | Discharge status: Home / Self Care | Payer: Medicare Other | Attending: Emergency Medicine | Admitting: Emergency Medicine

## 2016-07-28 DIAGNOSIS — Z79899 Other long term (current) drug therapy: Secondary | ICD-10-CM | POA: Diagnosis not present

## 2016-07-28 DIAGNOSIS — M545 Low back pain: Secondary | ICD-10-CM | POA: Diagnosis not present

## 2016-07-28 DIAGNOSIS — Z7901 Long term (current) use of anticoagulants: Secondary | ICD-10-CM | POA: Insufficient documentation

## 2016-07-28 DIAGNOSIS — W050XXA Fall from non-moving wheelchair, initial encounter: Secondary | ICD-10-CM | POA: Diagnosis not present

## 2016-07-28 DIAGNOSIS — Y999 Unspecified external cause status: Secondary | ICD-10-CM | POA: Diagnosis not present

## 2016-07-28 DIAGNOSIS — R0781 Pleurodynia: Secondary | ICD-10-CM | POA: Diagnosis not present

## 2016-07-28 DIAGNOSIS — Y929 Unspecified place or not applicable: Secondary | ICD-10-CM | POA: Diagnosis not present

## 2016-07-28 DIAGNOSIS — Y939 Activity, unspecified: Secondary | ICD-10-CM | POA: Diagnosis not present

## 2016-07-28 MED ORDER — HYDROCODONE-ACETAMINOPHEN 5-325 MG PO TABS
1.0000 | ORAL_TABLET | Freq: Once | ORAL | Status: AC
  Administered 2016-07-28: 1 via ORAL
  Filled 2016-07-28: qty 1

## 2016-07-28 MED ORDER — HYDROCODONE-ACETAMINOPHEN 5-325 MG PO TABS: 1.0000 | ORAL_TABLET | Freq: Four times a day (QID) | ORAL | 0 refills | Status: DC | PRN

## 2016-07-28 NOTE — ED Notes (Signed)
Pt assisted in calling cab

## 2016-07-28 NOTE — ED Triage Notes (Addendum)
Pt BIB EMS from home; pt slipped this yesterday AM on wet wooden ramp and fell backwards, hitting head and back; no LOC, no swelling noted to head; pt on Eliquis; no symptoms following fall; then about 45 minutes ago, pt twisted to the left in bed and felt a pop in his back; now c/o left, lower back pain that pt describes as feeling like a muscle spasm

## 2016-07-28 NOTE — ED Notes (Signed)
Bed: AY04 Expected date:  Expected time:  Means of arrival:  Comments: Fall, head injury, back pain

## 2016-07-28 NOTE — ED Notes (Signed)
MD at bedside. 

## 2016-07-28 NOTE — ED Provider Notes (Signed)
[facility] DEPT Provider Note: Georgena Spurling, MD, FACEP  CSN: [id number] MRN: [id number] ARRIVAL: 07/28/16 at [place]: [id number]  By signing my name below, I, Collene Leyden, attest that this documentation has been prepared under the direction and in the presence of Shanon Rosser, MD. Electronically Signed: Collene Leyden, Scribe. 07/28/16. 2:00 AM.  CHIEF COMPLAINT  Back Pain   HISTORY OF PRESENT ILLNESS  John Holder is a 83 y.o. male who presents to the emergency department by ambulance, complaining of left para-lumbar pain that began 45 minutes prior to arrival. Patient states he slipped on a wet wooden ramp and fell backwards yesterday, hitting his head and back. No symptoms following fall. Patient reports twisting his back to the left 45 minutes ago, in which he felt a "pop" in his back, when his back pain suddenly began. Pain is worse with coughing or movement. Pain is sharp and moderate to severe. It is located in his left posterolateral lower back. Patient denies loss of consciousness, loss of bowel/bladder control, lower extremity numbness or weakness or neck pain.   Past Medical History:  Diagnosis Date  . Atrial fibrillation [facility])     Past Surgical History:  Procedure Laterality Date  . CARDIOVERSION N/A 02/04/2016   Procedure: CARDIOVERSION;  Surgeon: Thayer Headings, MD;  Location: [facility] ENDOSCOPY;  Service: Cardiovascular;  Laterality: N/A;    No family history on file.  Social History  Substance Use Topics  . Smoking status: Never Smoker  . Smokeless tobacco: Never Used  . Alcohol use 0.0 oz/week     Comment: occasional    Prior to Admission medications   Medication Sig Start Date End Date Taking? Authorizing Provider  ALPRAZolam (XANAX) 0.25 MG tablet Take 0.25 mg by mouth every 6 (six) hours as needed. 07/22/14   Historical Provider, MD  ELIQUIS 5 MG TABS tablet Take 5 mg by mouth daily. 12/29/15   Historical Provider, MD  glucosamine-chondroitin 500-400 MG  tablet Take 1 tablet by mouth 3 (three) times daily.    Historical Provider, MD  metoprolol succinate (TOPROL-XL) 50 MG 24 hr tablet Take 50 mg by mouth daily. 12/29/15   Historical Provider, MD  Multiple Vitamin (MULTIVITAMIN) capsule Take 1 capsule by mouth daily.    Historical Provider, MD  omega-3 acid ethyl esters (LOVAZA) 1 G capsule Take 1 g by mouth 2 (two) times daily.    Historical Provider, MD  omeprazole (PRILOSEC) 20 MG capsule Take 20 mg by mouth daily. 07/27/14   Historical Provider, MD  saw palmetto 160 MG capsule Take 160 mg by mouth 2 (two) times daily.    Historical Provider, MD    Allergies Patient has no known allergies.   REVIEW OF SYSTEMS  Negative except as noted here or in the History of Present Illness.   PHYSICAL EXAMINATION  Initial Vital Signs Blood pressure (!) 157/114, pulse 61, temperature 97.9 F (36.6 C), temperature source Oral, resp. rate 16, SpO2 96 %.  Examination General: Well-developed, well-nourished male in no acute distress; appearance consistent with age of record HENT: normocephalic; atraumatic Eyes: pupils equal, round and reactive to light;  extraocular muscles intact; lens implants Neck: supple Heart: regular rate and rhythm Lungs: clear to auscultation bilaterally Chest: left posterolateral lower rib tenderness without deformity or crepitus Abdomen: soft; nondistended; nontender; no masses or hepatosplenomegaly; bowel sounds present Extremities: ; pulses normal Neurologic: Awake, alert and oriented; motor function intact in all extremities and symmetric; no facial droop Skin: Warm and dry Psychiatric:  Normal mood and affect   RESULTS  Summary of this visit's results, reviewed by myself:   EKG Interpretation  Date/Time:    Ventricular Rate:    PR Interval:    QRS Duration:   QT Interval:    QTC Calculation:   R Axis:     Text Interpretation:        Laboratory Studies: No results found for this or any previous visit  (from the past 24 hour(s)). Imaging Studies: Dg Ribs Unilateral W/chest Left  Result Date: 07/28/2016 CLINICAL DATA:  [place] from wheelchair ramp March 12, LEFT mid posterior rib pain. EXAM: LEFT RIBS AND CHEST - 3+ VIEW COMPARISON:  Chest radiograph January 07, 2016 FINDINGS: The cardiac silhouette is mild to moderately enlarged, similar. Mildly calcified aortic knob. Strandy densities LEFT lung base. No pleural effusion or focal consolidation. LEFT apical granuloma. No pneumothorax. Osteopenia. No rib fracture deformity. No acute rib fracture deformity. IMPRESSION: Severe similar cardiomegaly with LEFT lung base atelectasis/ scarring. Electronically Signed   By: Elon Alas M.D.   On: 07/28/2016 03:18   Dg Lumbar Spine Complete  Result Date: 07/28/2016 CLINICAL DATA:  Left side and low back pain radiating down the left leg after a fall. EXAM: LUMBAR SPINE - COMPLETE 4+ VIEW COMPARISON:  CT abdomen and pelvis 12/10/2015 FINDINGS: Normal alignment of the lumbar spine. Chronic anterior compression of L1 vertebra, unchanged since prior study. Diffuse degenerative changes throughout the lumbar spine with narrowed interspaces and endplate hypertrophic changes. Degenerative changes in the lumbar facet joints. No focal bone lesion or bone destruction. Bone cortex appears intact. Diffuse bone demineralization. Surgical clips in the abdomen. Visualized sacrum appears intact. IMPRESSION: No acute bony abnormalities. Degenerative changes throughout the lumbar spine. Chronic compression of L1 is unchanged. Electronically Signed   By: Lucienne Capers M.D.   On: 07/28/2016 02:10    ED COURSE  Nursing notes and initial vitals signs, including pulse oximetry, reviewed.  Vitals:   07/28/16 0128 07/28/16 0200 07/28/16 0300 07/28/16 0354  BP: (!) 157/114 (!) 156/109 146/100 (!) 140/101  Pulse: 61 (!) 30 83 69  Resp: 16   20  Temp: 97.9 F (36.6 C)     TempSrc: Oral     SpO2: 96% 97% 97% 93%   4:09  AM Patient advised of reassuring radiographs. I suspect he may have an occult rib fracture. We will treat with analgesics and have him follow-up with his primary care physician. I do not believe his rib pain is directly related to his fall.  PROCEDURES    ED DIAGNOSES     ICD-9-CM ICD-10-CM   1. Rib pain on left side 786.50 R07.81    I personally performed the services described in this documentation, which was scribed in my presence. The recorded information has been reviewed and is accurate.     Shanon Rosser, MD 07/28/16 [phone]

## 2016-07-28 NOTE — ED Notes (Signed)
Pt transported to XRay 

## 2016-08-09 ENCOUNTER — Ambulatory Visit (INDEPENDENT_AMBULATORY_CARE_PROVIDER_SITE_OTHER): Payer: Medicare Other | Admitting: Cardiovascular Disease

## 2016-08-09 ENCOUNTER — Encounter: Payer: Self-pay | Admitting: Cardiovascular Disease

## 2016-08-09 VITALS — BP 162/90 | HR 84 | Ht 72.0 in | Wt 176.8 lb

## 2016-08-09 DIAGNOSIS — I482 Chronic atrial fibrillation, unspecified: Secondary | ICD-10-CM

## 2016-08-09 DIAGNOSIS — I10 Essential (primary) hypertension: Secondary | ICD-10-CM | POA: Diagnosis not present

## 2016-08-09 MED ORDER — LOSARTAN POTASSIUM 50 MG PO TABS: 50.0000 mg | ORAL_TABLET | Freq: Every day | ORAL | 11 refills | Status: DC

## 2016-08-09 NOTE — Patient Instructions (Signed)
Medication Instructions:  START Losartan (Cozaar) 50 mg once daily   Labwork: Your physician recommends that you return for lab work in: 3 weeks for basic metabolic panel   Testing/Procedures: None Ordered   Follow-Up: Your physician recommends that you schedule a follow-up appointment in: 3 months with Dr. Acie Fredrickson   If you need a refill on your cardiac medications before your next appointment, please call your pharmacy.   Thank you for choosing [facility]! Christen Bame, RN [phone]
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
## 2016-08-30 ENCOUNTER — Other Ambulatory Visit: Payer: Medicare Other | Admitting: *Deleted

## 2016-08-30 DIAGNOSIS — I482 Chronic atrial fibrillation, unspecified: Secondary | ICD-10-CM

## 2016-08-30 DIAGNOSIS — I10 Essential (primary) hypertension: Secondary | ICD-10-CM

## 2016-08-30 LAB — BASIC METABOLIC PANEL
BUN / CREAT RATIO: 15 (ref 10–24)
BUN: 19 mg/dL (ref 8–27)
CALCIUM: 9.3 mg/dL (ref 8.6–10.2)
CO2: 23 mmol/L (ref 18–29)
Chloride: 100 mmol/L (ref 96–106)
Creatinine, Ser: 1.24 mg/dL (ref 0.76–1.27)
GFR calc Af Amer: 62 mL/min/{1.73_m2} (ref >59)
GFR calc non Af Amer: 54 mL/min/{1.73_m2} — ABNORMAL LOW (ref >59)
GLUCOSE: 90 mg/dL (ref 65–99)
Potassium: 4.4 mmol/L (ref 3.5–5.2)
Sodium: 139 mmol/L (ref 134–144)

## 2016-09-13 ENCOUNTER — Other Ambulatory Visit: Payer: Self-pay | Admitting: Urology

## 2016-09-13 DIAGNOSIS — D49511 Neoplasm of unspecified behavior of right kidney: Secondary | ICD-10-CM

## 2016-09-17 ENCOUNTER — Ambulatory Visit (HOSPITAL_COMMUNITY): Payer: Medicare Other

## 2016-09-17 ENCOUNTER — Ambulatory Visit (HOSPITAL_COMMUNITY)
Admission: RE | Admit: 2016-09-17 | Discharge: 2016-09-17 | Disposition: A | Discharge status: Home / Self Care | Payer: Medicare Other | Source: Ambulatory Visit | Attending: Urology | Admitting: Urology

## 2016-09-20 ENCOUNTER — Ambulatory Visit (HOSPITAL_COMMUNITY)
Admission: RE | Admit: 2016-09-20 | Discharge: 2016-09-20 | Disposition: A | Discharge status: Home / Self Care | Payer: Medicare Other | Source: Ambulatory Visit | Attending: Urology | Admitting: Urology

## 2016-09-20 DIAGNOSIS — K862 Cyst of pancreas: Secondary | ICD-10-CM | POA: Diagnosis not present

## 2016-09-20 DIAGNOSIS — I700 Atherosclerosis of aorta: Secondary | ICD-10-CM | POA: Insufficient documentation

## 2016-09-20 DIAGNOSIS — D49511 Neoplasm of unspecified behavior of right kidney: Secondary | ICD-10-CM | POA: Insufficient documentation

## 2016-09-20 MED ORDER — GADOBENATE DIMEGLUMINE 529 MG/ML IV SOLN
15.0000 mL | Freq: Once | INTRAVENOUS | Status: AC
  Administered 2016-09-20: 15 mL via INTRAVENOUS

## 2016-10-21 ENCOUNTER — Encounter: Payer: Self-pay | Admitting: Cardiovascular Disease

## 2016-10-21 ENCOUNTER — Ambulatory Visit (INDEPENDENT_AMBULATORY_CARE_PROVIDER_SITE_OTHER): Payer: Medicare Other | Admitting: Cardiovascular Disease

## 2016-10-21 VITALS — BP 128/78 | HR 98 | Ht 71.0 in | Wt 178.5 lb

## 2016-10-21 DIAGNOSIS — I4819 Other persistent atrial fibrillation: Secondary | ICD-10-CM

## 2016-10-21 DIAGNOSIS — I481 Persistent atrial fibrillation: Secondary | ICD-10-CM

## 2016-10-21 DIAGNOSIS — I10 Essential (primary) hypertension: Secondary | ICD-10-CM
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
## 2016-10-21 NOTE — Patient Instructions (Signed)
Medication Instructions:  Your physician recommends that you continue on your current medications as directed. Please refer to the Current Medication list given to you today.   Labwork: TODAY - basic metabolic panel   Testing/Procedures: None Ordered   Follow-Up: Your physician wants you to follow-up in: 6 months with Dr. Nahser. You will receive a reminder letter in the mail two months in advance. If you don't receive a letter, please call our office to schedule the follow-up appointment.   If you need a refill on your cardiac medications before your next appointment, please call your pharmacy.   Thank you for choosing [facility]! Maryl Blalock, RN [phone]    

## 2016-10-22 LAB — BASIC METABOLIC PANEL
BUN / CREAT RATIO: 12 (ref 10–24)
BUN: 13 mg/dL (ref 8–27)
CALCIUM: 9.1 mg/dL (ref 8.6–10.2)
CHLORIDE: 100 mmol/L (ref 96–106)
CO2: 24 mmol/L (ref 18–29)
Creatinine, Ser: 1.12 mg/dL (ref 0.76–1.27)
GFR calc Af Amer: 70 mL/min/{1.73_m2} (ref >59)
GFR calc non Af Amer: 61 mL/min/{1.73_m2} (ref >59)
Glucose: 90 mg/dL (ref 65–99)
POTASSIUM: 4.9 mmol/L (ref 3.5–5.2)
Sodium: 138 mmol/L (ref 134–144)

## 2017-02-24 ENCOUNTER — Other Ambulatory Visit: Payer: Self-pay | Admitting: Urology

## 2017-02-24 DIAGNOSIS — D4101 Neoplasm of uncertain behavior of right kidney: Secondary | ICD-10-CM

## 2017-02-24 DIAGNOSIS — D49511 Neoplasm of unspecified behavior of right kidney: Secondary | ICD-10-CM

## 2017-03-10 ENCOUNTER — Telehealth: Payer: Self-pay | Admitting: Cardiovascular Disease

## 2017-03-10 NOTE — Telephone Encounter (Signed)
°  New Prob  Patient c/o Palpitations:  High priority if patient c/o lightheadedness, shortness of breath, or chest pain  1) How long have you had palpitations/irregular HR/ Afib? Are you having the symptoms now? Pt states this intermittent but has been noticing it more in the last few weeks.  2) Are you currently experiencing lightheadedness, SOB or CP? No  3) Do you have a history of afib (atrial fibrillation) or irregular heart rhythm? Yes, hx of A-Fib.  4) Have you checked your BP or HR? (document readings if available): Yes, states HR readings have been "all over the place". Has not physically documented them.  5) Are you experiencing any other symptoms? Somewhat lethargic

## 2017-03-10 NOTE — Telephone Encounter (Signed)
Spoke with patient who states his HR has been varying greatly and he wants reassurance. He states he thought his medications were supposed to have his HR under control. He states he has a pulse oximeter and BP cuff that he uses for measurement. He states pulse rate varies from 50 bpm to >100 bpm and BP has been mostly 741'O mmHg systolic and 87'O mmHg diastolic but it was lower the last time he went to a doctor's office. He uses a BP machine at the pharmacy that is linked to his phone and he will bring those readings. He states he has been a little more fatigued lately and wonders if these variations are the cause. He would like to come in for evaluation. I advised that Dr. Acie Fredrickson will be rounding in the hospital all next week and scheduled him to see Truitt Merle, NP on Monday 10/29. He verbalized understanding and agreement and thanked me for the call.

## 2017-03-14 ENCOUNTER — Ambulatory Visit (INDEPENDENT_AMBULATORY_CARE_PROVIDER_SITE_OTHER): Payer: Medicare Other | Admitting: Nurse Practitioner

## 2017-03-14 ENCOUNTER — Encounter: Payer: Self-pay | Admitting: Nurse Practitioner

## 2017-03-14 VITALS — BP 148/96 | HR 71 | Ht 71.0 in | Wt 178.0 lb

## 2017-03-14 DIAGNOSIS — I481 Persistent atrial fibrillation: Secondary | ICD-10-CM

## 2017-03-14 DIAGNOSIS — I482 Chronic atrial fibrillation, unspecified: Secondary | ICD-10-CM

## 2017-03-14 DIAGNOSIS — I4819 Other persistent atrial fibrillation: Secondary | ICD-10-CM

## 2017-03-14 DIAGNOSIS — I10 Essential (primary) hypertension: Secondary | ICD-10-CM

## 2017-03-14 NOTE — Progress Notes (Signed)
CARDIOLOGY OFFICE NOTE  Date:  03/14/2017    John Holder Date of Birth: 1931-05-26 Medical Record #[id number]  PCP:  Burnard Bunting, MD  Cardiologist:  Nahser   Chief Complaint  Patient presents with  . Atrial Fibrillation    Work in visit - seen for Dr. Acie Fredrickson    History of Present Illness: John Holder is a 83 y.o. male who presents today for a work in visit. Seen for Dr. Acie Fredrickson.   He has a history of persistent AF, HLD, prior compression fracture and abdominal mass/right renal neoplasm with prior left nephrectomy.   Last seen here back in June - felt to be doing ok.   Phone call last week - "Spoke with patient who states his HR has been varying greatly and he wants reassurance. He states he thought his medications were supposed to have his HR under control. He states he has a pulse oximeter and BP cuff that he uses for measurement. He states pulse rate varies from 50 bpm to >100 bpm and BP has been mostly 614'E mmHg systolic and 31'V mmHg diastolic but it was lower the last time he went to a doctor's office. He uses a BP machine at the pharmacy that is linked to his phone and he will bring those readings. He states he has been a little more fatigued lately and wonders if these variations are the cause. He would like to come in for evaluation. I advised that Dr. Acie Fredrickson will be rounding in the hospital all next week and scheduled him to see Truitt Merle, NP on Monday 10/29. He verbalized understanding and agreement and thanked me for the call."  Thus added to my schedule for today.    Comes in today. Here alone. He notes several concerns. He is using a pulse ox - had bought for his wife when she was real sick last year - he has started using the pulse ox to check his HR - very erratic with lots of lability. He feels fine. No chest pain. Breathing is good. Not dizzy or lightheaded. No syncope. No sensation of palpitations. BP diary from home is ok for the most part.   Past  Medical History:  Diagnosis Date  . Atrial fibrillation [facility])     Past Surgical History:  Procedure Laterality Date  . CARDIOVERSION N/A 02/04/2016   Procedure: CARDIOVERSION;  Surgeon: Thayer Headings, MD;  [facility]: [facility] ENDOSCOPY;  Service: Cardiovascular;  Laterality: N/A;     Medications: Current Meds  Medication Sig  . ALPRAZolam (XANAX) 0.25 MG tablet Take 0.25 mg by mouth every 6 (six) hours as needed.  [name] ELIQUIS 5 MG TABS tablet Take 5 mg by mouth 2 (two) times daily.   [name] glucosamine-chondroitin 500-400 MG tablet Take 1 tablet by mouth 2 (two) times daily.   . metoprolol succinate (TOPROL-XL) 50 MG 24 hr tablet Take 50 mg by mouth daily.  . Multiple Vitamin (MULTIVITAMIN) capsule Take 1 capsule by mouth daily.  [name] omega-3 acid ethyl esters (LOVAZA) 1 G capsule Take 1 g by mouth 2 (two) times daily.  [name] omeprazole (PRILOSEC) 20 MG capsule Take 20 mg by mouth daily.  . saw palmetto 160 MG capsule Take 160 mg by mouth 2 (two) times daily.     Allergies: No Known Allergies  Social History: The patient  reports that he has never smoked. He has never used smokeless tobacco. He reports that he drinks alcohol. He reports that he does not  use drugs.   Family History: The patient's family history is not on file.   Review of Systems: Please see the history of present illness.   Otherwise, the review of systems is positive for none.   All other systems are reviewed and negative.   Physical Exam: VS:  BP (!) 148/96   Pulse 71   Ht 5\' 11"  (1.803 m)   Wt 178 lb (80.7 kg)   BMI 24.83 kg/m  .  BMI Body mass index is 24.83 kg/m.  Wt Readings from Last 3 Encounters:  03/14/17 178 lb (80.7 kg)  10/21/16 178 lb 8 oz (81 kg)  08/09/16 176 lb 12.8 oz (80.2 kg)   Repeat BP by me is 130/80. His HR is 72 by my count.   General: Pleasant. Well developed, well nourished and in no acute distress.   HEENT: Normal.  Neck: Supple, no JVD, carotid bruits, or masses noted.  Cardiac:  Irregular irregular rhythm. Rate is ok. HR is 72 by my count.  No murmurs, rubs, or gallops. No edema. His pulse ox reads 40's to 70's to 100's - all within just a minute or so.  Respiratory:  Lungs are clear to auscultation bilaterally with normal work of breathing.  GI: Soft and nontender.  MS: No deformity or atrophy. Gait and ROM intact.  Skin: Warm and dry. Color is normal.  Neuro:  Strength and sensation are intact and no gross focal deficits noted.  Psych: Alert, appropriate and with normal affect.   LABORATORY DATA:  EKG:  EKG is ordered today. This demonstrates AF with a controlled VR of 71 today.  Lab Results  Component Value Date   WBC 4.1 02/02/2016   HGB 13.7 02/02/2016   HCT 41.4 02/02/2016   PLT 141 02/02/2016   GLUCOSE 90 10/21/2016   NA 138 10/21/2016   K 4.9 10/21/2016   CL 100 10/21/2016   CREATININE 1.12 10/21/2016   BUN 13 10/21/2016   CO2 24 10/21/2016   INR 1.1 02/02/2016     BNP (last 3 results) No results for input(s): BNP in the last 8760 hours.  ProBNP (last 3 results) No results for input(s): PROBNP in the last 8760 hours.   Other Studies Reviewed Today:  Echo Study Conclusions 02/2016  - Left ventricle: The cavity size was normal. There was mild   concentric hypertrophy. Systolic function was normal. The   estimated ejection fraction was in the range of 55% to 60%. - Mitral valve: There was mild to moderate regurgitation directed   centrally. - Left atrium: The atrium was moderately dilated. - Right atrium: The atrium was moderately to severely dilated. - Atrial septum: No defect or patent foramen ovale was identified. - Tricuspid valve: There was moderate regurgitation. - Pulmonary arteries: Systolic pressure was mildly increased. PA   peak pressure: 41 mm Hg (S).  Assessment/Plan:  1. Persistent AF - I think his rate is fine and well controlled. I think the device has trouble counting actual HR due to his AF. It does not correlate  to my readings at all. I offered him a holter - he declined. I advised to be on the lookout for symptoms.   2. HTN - recheck by me is fine - no changes made today.   3. Chronic anticoagulation - no problems noted. I do not know if he has had follow up CBC - needs repeat on his return visit.   4. Right renal mass - followed by GU - not  addressed.   Current medicines are reviewed with the patient today.  The patient does not have concerns regarding medicines other than what has been noted above.  The following changes have been made:  See above.  Labs/ tests ordered today include:    Orders Placed This Encounter  Procedures  . EKG 12-Lead     Disposition:   See Dr. Acie Fredrickson as planned in January.   Patient is agreeable to this plan and will call if any problems develop in the interim.   SignedTruitt Merle, NP  03/14/2017 10:39 AM  [facility] [address] [place], [place]  [address] Phone: [phone] Fax: [phone]

## 2017-03-14 NOTE — Patient Instructions (Addendum)
We will be checking the following labs today - NONE   Medication Instructions:    Continue with your current medicines.     Testing/Procedures To Be Arranged:  N/A  Follow-Up:   See Dr. Acie Fredrickson in January as planned.     Other Special Instructions:   Let us know if you have any dizzy spells, passing out, fast heart beating, etc.     If you need a refill on your cardiac medications before your next appointment, please call your pharmacy.   Call the [place] office at [phone] if you have any questions, problems or concerns.

## 2017-03-28 ENCOUNTER — Ambulatory Visit: Payer: Medicare Other | Admitting: Physician Assistant

## 2017-03-29 ENCOUNTER — Ambulatory Visit: Payer: Medicare Other | Admitting: Physician Assistant

## 2017-04-27 ENCOUNTER — Ambulatory Visit (HOSPITAL_COMMUNITY)
Admission: RE | Admit: 2017-04-27 | Discharge: 2017-04-27 | Disposition: A | Discharge status: Home / Self Care | Payer: Medicare Other | Source: Ambulatory Visit | Attending: Urology | Admitting: Urology

## 2017-04-27 DIAGNOSIS — D4101 Neoplasm of uncertain behavior of right kidney: Secondary | ICD-10-CM

## 2017-04-27 DIAGNOSIS — M5136 Other intervertebral disc degeneration, lumbar region: Secondary | ICD-10-CM | POA: Insufficient documentation

## 2017-04-27 DIAGNOSIS — K429 Umbilical hernia without obstruction or gangrene: Secondary | ICD-10-CM | POA: Diagnosis not present

## 2017-04-27 DIAGNOSIS — D49511 Neoplasm of unspecified behavior of right kidney: Secondary | ICD-10-CM | POA: Diagnosis not present

## 2017-04-27 DIAGNOSIS — M47896 Other spondylosis, lumbar region: Secondary | ICD-10-CM | POA: Diagnosis not present

## 2017-04-27 DIAGNOSIS — I517 Cardiomegaly: Secondary | ICD-10-CM | POA: Diagnosis not present

## 2017-04-27 LAB — POCT I-STAT CREATININE: CREATININE: 1.2 mg/dL (ref 0.61–1.24)

## 2017-04-27 MED ORDER — GADOBENATE DIMEGLUMINE 529 MG/ML IV SOLN
20.0000 mL | Freq: Once | INTRAVENOUS | Status: AC | PRN
  Administered 2017-04-27: 20 mL via INTRAVENOUS

## 2017-05-26 ENCOUNTER — Encounter: Payer: Self-pay | Admitting: Cardiovascular Disease

## 2017-05-26 ENCOUNTER — Ambulatory Visit: Payer: Medicare Other | Admitting: Cardiovascular Disease

## 2017-05-26 VITALS — BP 122/82 | HR 77 | Ht 71.0 in | Wt 178.0 lb

## 2017-05-26 DIAGNOSIS — I482 Chronic atrial fibrillation, unspecified: Secondary | ICD-10-CM

## 2017-05-26 DIAGNOSIS — I10 Essential (primary) hypertension: Secondary | ICD-10-CM

## 2017-05-26 LAB — CBC
HEMOGLOBIN: 15.1 g/dL (ref 13.0–17.7)
Hematocrit: 43.7 % (ref 37.5–51.0)
MCH: 30.4 pg (ref 26.6–33.0)
MCHC: 34.6 g/dL (ref 31.5–35.7)
MCV: 88 fL (ref 79–97)
PLATELETS: 136 10*3/uL — AB (ref 150–379)
RBC: 4.97 x10E6/uL (ref 4.14–5.80)
RDW: 14.1 % (ref 12.3–15.4)
WBC: 3.6 10*3/uL (ref 3.4–10.8)

## 2017-05-26 LAB — BASIC METABOLIC PANEL
BUN/Creatinine Ratio: 11 (ref 10–24)
BUN: 14 mg/dL (ref 8–27)
CHLORIDE: 101 mmol/L (ref 96–106)
CO2: 26 mmol/L (ref 20–29)
CREATININE: 1.25 mg/dL (ref 0.76–1.27)
Calcium: 9.3 mg/dL (ref 8.6–10.2)
GFR calc Af Amer: 61 mL/min/{1.73_m2} (ref >59)
GFR calc non Af Amer: 53 mL/min/{1.73_m2} — ABNORMAL LOW (ref >59)
GLUCOSE: 59 mg/dL — AB (ref 65–99)
Potassium: 4.8 mmol/L (ref 3.5–5.2)
Sodium: 138 mmol/L (ref 134–144)

## 2017-05-26 NOTE — Patient Instructions (Signed)
Medication Instructions:  Your physician recommends that you continue on your current medications as directed. Please refer to the Current Medication list given to you today.   Labwork: TODAY - CBC, BMET   Testing/Procedures: None Ordered   Follow-Up: Your physician wants you to follow-up in: 6 months with Dr. Nahser.  You will receive a reminder letter in the mail two months in advance. If you don't receive a letter, please call our office to schedule the follow-up appointment.   If you need a refill on your cardiac medications before your next appointment, please call your pharmacy.   Thank you for choosing [facility]! Ripley Bogosian, RN [phone]    
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
## 2017-07-26 ENCOUNTER — Other Ambulatory Visit: Payer: Self-pay | Admitting: Cardiovascular Disease

## 2017-08-29 ENCOUNTER — Other Ambulatory Visit: Payer: Self-pay | Admitting: Cardiovascular Disease

## 2017-08-30 ENCOUNTER — Other Ambulatory Visit: Payer: Self-pay | Admitting: Cardiovascular Disease

## 2017-08-30 NOTE — Telephone Encounter (Signed)
Outpatient Medication Detail    Disp Refills Start End   losartan (COZAAR) 50 MG tablet 30 tablet 9 07/27/2017    Sig: TAKE 1 TABLET BY MOUTH ONCE DAILY   Sent to pharmacy as: losartan (COZAAR) 50 MG tablet   E-Prescribing Status: Receipt confirmed by pharmacy (07/27/2017 11:14 AM EDT)   Pharmacy   [facility] [place], [place]

## 2018-04-15 ENCOUNTER — Other Ambulatory Visit (HOSPITAL_BASED_OUTPATIENT_CLINIC_OR_DEPARTMENT_OTHER): Payer: Self-pay | Admitting: Nurse Practitioner

## 2018-04-15 ENCOUNTER — Ambulatory Visit (HOSPITAL_BASED_OUTPATIENT_CLINIC_OR_DEPARTMENT_OTHER)
Admission: RE | Admit: 2018-04-15 | Discharge: 2018-04-15 | Disposition: A | Discharge status: Home / Self Care | Payer: Medicare Other | Source: Ambulatory Visit | Attending: Nurse Practitioner | Admitting: Nurse Practitioner

## 2018-04-15 DIAGNOSIS — M7989 Other specified soft tissue disorders: Secondary | ICD-10-CM

## 2018-08-04 ENCOUNTER — Telehealth: Payer: Self-pay | Admitting: Cardiovascular Disease

## 2018-08-04 ENCOUNTER — Encounter (HOSPITAL_COMMUNITY): Payer: Self-pay

## 2018-08-04 NOTE — Telephone Encounter (Signed)
Pt c/o Shortness Of Breath: STAT if SOB developed within the last 24 hours or pt is noticeably SOB on the phone  1. Are you currently SOB (can you hear that pt is SOB on the phone)? No 2. How long have you been experiencing SOB? A couple of weeks 3. Are you SOB when sitting or when up moving around?when moving around 4.  Are you currently experiencing any other symptoms? Heart racing, last night it was 130 and oxygen level was in the high 80's-pt wants to be seen today

## 2018-08-04 NOTE — Telephone Encounter (Signed)
Called pt back regarding SOB Pt c/o SOB with exertion No SOB when resting Pt reported last nights HR 130 and O2sat high 80's Pt has not experienced any weight gain Minimal swelling in ankles that is controlled with compression socks  PT has tele visit with Afib clinic on Monday 3/23  Asked Dr. Curt Bears DOD for advisement MD recommended pt go to the ER with those symptoms  Advised pt of [place] recommendation to visit ER and pt was disagreeable and said he would not be going to the ER

## 2018-08-07 ENCOUNTER — Ambulatory Visit (HOSPITAL_COMMUNITY): Admission: RE | Admit: 2018-08-07 | Payer: Medicare Other | Source: Ambulatory Visit | Admitting: Physician Assistant

## 2018-08-07 ENCOUNTER — Ambulatory Visit (HOSPITAL_COMMUNITY)
Admission: RE | Admit: 2018-08-07 | Discharge: 2018-08-07 | Disposition: A | Discharge status: Home / Self Care | Payer: Medicare Other | Source: Ambulatory Visit | Attending: Nurse Practitioner | Admitting: Nurse Practitioner

## 2018-08-07 ENCOUNTER — Other Ambulatory Visit: Payer: Self-pay

## 2018-08-07 ENCOUNTER — Encounter (HOSPITAL_COMMUNITY): Payer: Self-pay

## 2018-08-07 VITALS — BP 130/70 | HR 78

## 2018-08-07 DIAGNOSIS — I4819 Other persistent atrial fibrillation: Secondary | ICD-10-CM

## 2018-08-08 ENCOUNTER — Encounter (HOSPITAL_COMMUNITY): Payer: Self-pay | Admitting: Physician Assistant

## 2018-08-08 NOTE — Progress Notes (Signed)
Electrophysiology TeleHealth Note   Due to national recommendations of social distancing due to [place] 19, Audio/video telehealth visit is felt to be most appropriate for this patient at this time.  See MyChart message from today for patient consent regarding telehealth for the Atrial Fibrillation Clinic.    Date:  08/08/2018   ID:  John Holder, DOB 29-Dec-1931, MRN [id number]  Location: home  Provider location: [address] [place], [place] [address] Evaluation Performed: New patient consult  PCP:  Burnard Bunting, MD  Primary Cardiologist:  Dr Acie Fredrickson   CC: Consult for atrial fibrillaiton   History of Present Illness: John Holder is a 85 y.o. male who presents via audio/video conferencing for a telehealth visit today.   The patient is referred for new consultation regarding atrial fibrillation by Dr Reynaldo Minium. Patient reports that he has had atrial fibrillation for several years and has been asymptomatic for the most part. He has still been able to be very active by playing tennis. This past month, he reports that he feels much more fatigued with even mild activity such as walking up a small incline or going up stairs. Noted in the past he has opted for rate control only. He denies snoring or significant alcohol use.  Today, he denies symptoms of palpitations, chest pain, orthopnea, PND, lower extremity edema, claudication, dizziness, presyncope, syncope, bleeding, or neurologic sequela. The patient is tolerating medications without difficulties and is otherwise without complaint today.   he denies symptoms of cough, fevers, chills, or new SOB worrisome for COVID 19.     Atrial Fibrillation Risk Factors:  he does not have symptoms or diagnosis of sleep apnea. he does not have a history of rheumatic fever. he does not have a history of alcohol use. The patient does not have a history of early familial atrial fibrillation or other arrhythmias.  he has a BMI of There is no height or  weight on file to calculate BMI..  BP 130/70 Pulse 78  Past Medical History:  Diagnosis Date  . Atrial fibrillation [facility])    Past Surgical History:  Procedure Laterality Date  . CARDIOVERSION N/A 02/04/2016   Procedure: CARDIOVERSION;  Surgeon: Thayer Headings, MD;  Location: [facility] ENDOSCOPY;  Service: Cardiovascular;  Laterality: N/A;     Current Outpatient Medications  Medication Sig Dispense Refill  . ALPRAZolam (XANAX) 0.25 MG tablet Take 0.25 mg by mouth every 6 (six) hours as needed for anxiety.   1  . ELIQUIS 5 MG TABS tablet Take 5 mg by mouth 2 (two) times daily.     [name] glucosamine-chondroitin 500-400 MG tablet Take 1 tablet by mouth 2 (two) times daily.     [name] losartan (COZAAR) 50 MG tablet TAKE 1 TABLET BY MOUTH ONCE DAILY 30 tablet 9  . metoprolol succinate (TOPROL-XL) 50 MG 24 hr tablet Take 50 mg by mouth daily.    . Multiple Vitamin (MULTIVITAMIN) capsule Take 1 capsule by mouth daily.    [name] omega-3 acid ethyl esters (LOVAZA) 1 G capsule Take 1 g by mouth 2 (two) times daily.    [name] omeprazole (PRILOSEC) 20 MG capsule Take 20 mg by mouth daily.  2  . saw palmetto 160 MG capsule Take 160 mg by mouth 2 (two) times daily.     No current facility-administered medications for this encounter.     Allergies:   Patient has no known allergies.   Social History:  The patient  reports that he has never  smoked. He has never used smokeless tobacco. He reports current alcohol use. He reports that he does not use drugs.   Family History:  The patient's family history is not on file.    ROS:  Please see the history of present illness.   All other systems are personally reviewed and negative.   Exam: Well appearing, alert and conversant, regular work of breathing,  good skin color  Recent Labs: No results found for requested labs within last 8760 hours.  personally reviewed    Other studies personally reviewed: Additional studies/ records that were reviewed today include: Epic  notes. Echocardiogram. Review of the above records today demonstrates:   Echo 02/2016 Left ventricle: The cavity size was normal. There was mild   concentric hypertrophy. Systolic function was normal. The   estimated ejection fraction was in the range of 55% to 60%. - Mitral valve: There was mild to moderate regurgitation directed   centrally. - Left atrium: The atrium was moderately dilated. - Right atrium: The atrium was moderately to severely dilated. - Atrial septum: No defect or patent foramen ovale was identified. - Tricuspid valve: There was moderate regurgitation. - Pulmonary arteries: Systolic pressure was mildly increased. PA   peak pressure: 41 mm Hg (S).   ASSESSMENT AND PLAN:  1.  Persistent atrial fibrillation Patient has had asymptomatic persistent atrial fibrillation. He now reports symptoms of SOB and fatigue with activity.  We will order Zio patch to evaluate his heart rates to be sure he is adequately rate controlled. Note, biatrial enlargement in 2017, likely worse now with persistent afib.  Continue Eliquis 5 mg BID Continue Toprol 50 mg daily Encouraged lifestyle modifications including regular physical activity.  This patients CHA2DS2-VASc Score and unadjusted Ischemic Stroke Rate (% per year) is equal to 3.2 % stroke rate/year from a score of 3  Above score calculated as 1 point each if present [CHF, HTN, DM, Vascular=MI/PAD/Aortic Plaque, Age if 65-74, or Male] Above score calculated as 2 points each if present [Age > 75, or Stroke/TIA/TE]  2. HTN Stable, no changes today.  COVID screen The patient does not have any symptoms that suggest any further testing/ screening at this time.  Social distancing reinforced today.    Follow-up: With afib clinic in 3-4 weeks once Zio patch results are back.  Current medicines are reviewed at length with the patient today.   The patient does not have concerns regarding his medicines.  The following changes were  made today:  none  Labs/ tests ordered today include:  No orders of the defined types were placed in this encounter.   Patient Risk:  after full review of this patients clinical status, I feel that they are at moderate risk at this time.   Today, I have spent 22 minutes with the patient with telehealth technology discussing atrial fibrillation, stroke risk, and lifestyle modifications.    Gwenlyn Perking PA-C 08/08/2018 9:35 AM  Afib [facility] [address] [place], [place] [address] [phone]

## 2018-08-11 ENCOUNTER — Other Ambulatory Visit: Payer: Self-pay | Admitting: Cardiovascular Disease

## 2018-08-11 MED ORDER — LOSARTAN POTASSIUM 50 MG PO TABS: 50.0000 mg | ORAL_TABLET | Freq: Every day | ORAL | 1 refills | Status: DC

## 2018-08-17 ENCOUNTER — Other Ambulatory Visit: Payer: Self-pay

## 2018-08-17 ENCOUNTER — Ambulatory Visit (HOSPITAL_COMMUNITY)
Admission: RE | Admit: 2018-08-17 | Discharge: 2018-08-17 | Disposition: A | Discharge status: Home / Self Care | Payer: Medicare Other | Source: Ambulatory Visit | Attending: Physician Assistant | Admitting: Physician Assistant

## 2018-08-17 ENCOUNTER — Other Ambulatory Visit (HOSPITAL_COMMUNITY): Payer: Self-pay | Admitting: *Deleted

## 2018-08-17 DIAGNOSIS — I482 Chronic atrial fibrillation, unspecified: Secondary | ICD-10-CM

## 2018-08-31 ENCOUNTER — Telehealth: Payer: Self-pay | Admitting: *Deleted

## 2018-08-31 NOTE — Telephone Encounter (Signed)
SPOKE WITH PT ABOUT TELEHEALTH VISIT OPTIONS PT HAS HAD AN VISIT ALREADY .  PT SAY HE HAD [employer] AND INSTRUCTIONS WAS SENT THROUGH  MYCHART PT AND I DISCUSSED USING THE MYCHART OPTION OR DOXIMITY   PT WILL GET A CALL BACK TOMORROW FOR SURETY OF STEPS  OF [place]

## 2018-09-01 ENCOUNTER — Other Ambulatory Visit: Payer: Self-pay

## 2018-09-01 ENCOUNTER — Encounter (HOSPITAL_COMMUNITY): Payer: Self-pay | Admitting: Physician Assistant

## 2018-09-01 ENCOUNTER — Ambulatory Visit (HOSPITAL_COMMUNITY)
Admission: RE | Admit: 2018-09-01 | Discharge: 2018-09-01 | Disposition: A | Discharge status: Home / Self Care | Payer: Medicare Other | Source: Ambulatory Visit | Attending: Physician Assistant | Admitting: Physician Assistant

## 2018-09-01 ENCOUNTER — Encounter (HOSPITAL_COMMUNITY): Payer: Self-pay

## 2018-09-01 ENCOUNTER — Other Ambulatory Visit (HOSPITAL_COMMUNITY): Payer: Self-pay | Admitting: *Deleted

## 2018-09-01 VITALS — BP 127/77 | HR 68

## 2018-09-01 DIAGNOSIS — I4819 Other persistent atrial fibrillation: Secondary | ICD-10-CM

## 2018-09-01 MED ORDER — METOPROLOL SUCCINATE ER 50 MG PO TB24: ORAL_TABLET | ORAL | 6 refills | Status: DC

## 2018-09-01 NOTE — Progress Notes (Signed)
Electrophysiology TeleHealth Note   Due to national recommendations of social distancing due to [place] 19, Audio/video telehealth visit is felt to be most appropriate for this patient at this time.  See MyChart message from today for patient consent regarding telehealth for the Atrial Fibrillation Clinic.    Date:  09/01/2018   ID:  John Holder, DOB 1931-08-17, MRN [id number]  Location: home  Provider location: [address] [place], [place] [address] Evaluation Performed: New patient consult  PCP:  Burnard Bunting, MD  Primary Cardiologist:  Dr Acie Fredrickson   CC: Follow up for atrial fibrillaiton   History of Present Illness: John Holder is a 85 y.o. male who presents via audio/video conferencing for a telehealth visit today. Patient reports that his fatigue and SOB have improved since his last visit. Denies any chest discomfort. Zio patch results reviewed at it seems that his fatigue correlated with elevated heart rates during exertion. His overall rate was controlled. He continues to be very active. He denies snoring or significant alcohol use.  Today, he denies symptoms of palpitations, chest pain, orthopnea, PND, lower extremity edema, claudication, dizziness, presyncope, syncope, bleeding, or neurologic sequela. The patient is tolerating medications without difficulties and is otherwise without complaint today.   he denies symptoms of cough, fevers, chills, or new SOB worrisome for COVID 19.     Atrial Fibrillation Risk Factors:  he does not have symptoms or diagnosis of sleep apnea. he does not have a history of rheumatic fever. he does not have a history of alcohol use. The patient does not have a history of early familial atrial fibrillation or other arrhythmias.  he has a BMI of There is no height or weight on file to calculate BMI..  BP 127/77 Pulse 68  Past Medical History:  Diagnosis Date  . Atrial fibrillation [facility])    Past Surgical History:  Procedure  Laterality Date  . CARDIOVERSION N/A 02/04/2016   Procedure: CARDIOVERSION;  Surgeon: Thayer Headings, MD;  Location: [facility] ENDOSCOPY;  Service: Cardiovascular;  Laterality: N/A;     Current Outpatient Medications  Medication Sig Dispense Refill  . ALPRAZolam (XANAX) 0.25 MG tablet Take 0.25 mg by mouth every 6 (six) hours as needed for anxiety.   1  . ELIQUIS 5 MG TABS tablet Take 5 mg by mouth 2 (two) times daily.     [name] glucosamine-chondroitin 500-400 MG tablet Take 1 tablet by mouth 2 (two) times daily.     [name] losartan (COZAAR) 50 MG tablet Take 1 tablet (50 mg total) by mouth daily. 90 tablet 1  . metoprolol succinate (TOPROL-XL) 50 MG 24 hr tablet Take 50 mg by mouth daily.    . Multiple Vitamin (MULTIVITAMIN) capsule Take 1 capsule by mouth daily.    [name] omega-3 acid ethyl esters (LOVAZA) 1 G capsule Take 1 g by mouth 2 (two) times daily.    [name] omeprazole (PRILOSEC) 20 MG capsule Take 20 mg by mouth daily.  2  . saw palmetto 160 MG capsule Take 160 mg by mouth 2 (two) times daily.     No current facility-administered medications for this encounter.     Allergies:   Patient has no known allergies.   Social History:  The patient  reports that he has never smoked. He has never used smokeless tobacco. He reports current alcohol use. He reports that he does not use drugs.   Family History:  The patient's family history is not on file.  ROS:  Please see the history of present illness.   All other systems are personally reviewed and negative.   Exam: Well appearing, alert and conversant, regular work of breathing, good skin color.   Recent Labs: No results found for requested labs within last 8760 hours.   Other studies personally reviewed: Additional studies/ records that were reviewed today include: Epic notes. Echocardiogram. Review of the above records today demonstrates:   Echo 02/2016 Left ventricle: The cavity size was normal. There was mild   concentric hypertrophy.  Systolic function was normal. The   estimated ejection fraction was in the range of 55% to 60%. - Mitral valve: There was mild to moderate regurgitation directed   centrally. - Left atrium: The atrium was moderately dilated. - Right atrium: The atrium was moderately to severely dilated. - Atrial septum: No defect or patent foramen ovale was identified. - Tricuspid valve: There was moderate regurgitation. - Pulmonary arteries: Systolic pressure was mildly increased. PA   peak pressure: 41 mm Hg (S).   ASSESSMENT AND PLAN:  1.  Persistent atrial fibrillation Patient has persistent atrial fibrillation.  Note, biatrial enlargement in 2017, likely worse now with persistent afib.  Likely his symptoms of fatigue, although improved, are 2/2 elevated heart rate.  Increase Toprol to 25 mg AM and 50 mg PM for better rate control. Could also consider changing to diltiazem if symptoms persist or worsen. Continue Eliquis 5 mg BID Encouraged to continue lifestyle modifications including regular physical activity.  This patients CHA2DS2-VASc Score and unadjusted Ischemic Stroke Rate (% per year) is equal to 3.2 % stroke rate/year from a score of 3  Above score calculated as 1 point each if present [CHF, HTN, DM, Vascular=MI/PAD/Aortic Plaque, Age if 65-74, or Male] Above score calculated as 2 points each if present [Age > 75, or Stroke/TIA/TE]  2. HTN Stable, med changes as above.   COVID screen The patient does not have any symptoms that suggest any further testing/ screening at this time.  Social distancing reinforced today.    Follow-up with Richardson Dopp and Dr Acie Fredrickson as scheduled. AF clinic in 6 months.  Current medicines are reviewed at length with the patient today.   The patient does not have concerns regarding his medicines.  The following changes were made today: increase metoprolol  Labs/ tests ordered today include:  No orders of the defined types were placed in this encounter.    Patient Risk:  after full review of this patients clinical status, I feel that they are at moderate risk at this time.   Today, I have spent 24 minutes with the patient with telehealth technology discussing atrial fibrillation, Zio patch results, lifestyle modifications, and COVID-19 precautions.   Gwenlyn Perking PA-C 09/01/2018 1:54 PM  Afib [facility] [address] [place], [place] [address] [phone]

## 2018-09-05 NOTE — Progress Notes (Signed)
Virtual Visit via Video Note   This visit type was conducted due to national recommendations for restrictions regarding the COVID-19 Pandemic (e.g. social distancing) in an effort to limit this patient's exposure and mitigate transmission in our community.  Due to his co-morbid illnesses, this patient is at least at moderate risk for complications without adequate follow up.  This format is felt to be most appropriate for this patient at this time.  All issues noted in this document were discussed and addressed.  A limited physical exam was performed with this format.  Please refer to the patient's chart for his consent to telehealth for [facility].   Evaluation Performed:  Follow-up visit  Date:  09/06/2018   ID:  John Holder, John Holder 1931-08-11, MRN [id number]  Patient Location: Home Provider Location: Home  PCP:  Burnard Bunting, MD  Cardiologist:  Mertie Moores, MD   Electrophysiologist:  None   Chief Complaint:  Shortness of breath   History of Present Illness:    John Holder is a 85 y.o. male with atrial fibrillation, hypertension, prior L nephrectomy due to renal mass.  He was last seen by Dr. Acie Fredrickson in 05/2017.  He was recently evaluated in the [facility] Clinic due to fatigue.  A LT-Cardiac monitor demonstrated periodic uncontrolled HRs and his beta-blocker was adjusted.    He notes that he has had difficulty with shortness of breath with exertion over the past several weeks.  He typically plays tennis twice a week.  He has had to stop because he gets winded easily.  His teammates notice he is pale when he feels out of breath.  He sometimes gets out of breath just getting up from a chair.  He has not had chest discomfort, arm discomfort, jaw discomfort.  He has not had orthopnea or paroxysmal nocturnal dyspnea.  He has had some mild pedal edema.  He denies syncope.  His primary care provider thought he saw some pulmonary edema on chest x-ray.  The official read was apparently normal.   However, the patient was placed on Lasix 40 mg daily.  He felt like this was decreasing his energy and had not really noticed much of a difference in his breathing.  Therefore, he stopped taking this yesterday.  Since increasing his metoprolol, he is not sure if his symptoms have improved.  He was out of town this past weekend and walking up some hills without difficulty.  However, over the past couple of days, he has noted dyspnea with exertion again.   The patient does not have symptoms concerning for COVID-19 infection (fever, chills, cough, or new shortness of breath).    Past Medical History:  Diagnosis Date   Atrial fibrillation [facility])    Past Surgical History:  Procedure Laterality Date   CARDIOVERSION N/A 02/04/2016   Procedure: CARDIOVERSION;  Surgeon: Thayer Headings, MD;  Location: [facility] ENDOSCOPY;  Service: Cardiovascular;  Laterality: N/A;     Holder Meds  Medication Sig   ALPRAZolam (XANAX) 0.25 MG tablet Take 0.25 mg by mouth every 6 (six) hours as needed for anxiety.    Barberry-Oreg Grape-Goldenseal (BERBERINE COMPLEX PO) Take 600 mg by mouth 2 (two) times a day.   ELIQUIS 5 MG TABS tablet Take 5 mg by mouth 2 (two) times daily.    glucosamine-chondroitin 500-400 MG tablet Take 1 tablet by mouth 2 (two) times daily.    losartan (COZAAR) 50 MG tablet Take 1 tablet (50 mg total) by mouth daily.  Multiple Vitamin (MULTIVITAMIN) capsule Take 1 capsule by mouth daily.   omega-3 acid ethyl esters (LOVAZA) 1 G capsule Take 1 g by mouth 2 (two) times daily.   omeprazole (PRILOSEC) 20 MG capsule Take 20 mg by mouth daily.   saw palmetto 160 MG capsule Take 160 mg by mouth 2 (two) times daily.   [DISCONTINUED] losartan (COZAAR) 25 MG tablet TK 2 TS PO QD   [DISCONTINUED] metoprolol succinate (TOPROL-XL) 50 MG 24 hr tablet Take 1/2 tablet (25mg ) in the AM and 1 tablet (50mg ) in the PM     Allergies:   Patient has no known allergies.   Social History   Tobacco Use    Smoking status: Never Smoker   Smokeless tobacco: Never Used  Substance Use Topics   Alcohol use: Yes    Alcohol/week: 0.0 standard drinks    Comment: occasional   Drug use: No     Family Hx: The patient's family history is not on file.  ROS:   Please see the history of present illness.    He has not had any bleeding issues, vomiting, diarrhea, fever. All other systems reviewed and are negative.   Prior CV studies:   The following studies were reviewed today:  Echo 02/18/16 Mild conc LVH, EF 55-60, mild to mod MR, mod LAE, mod to severe RAE, mod TR, PASP 41   Labs/Other Tests and Data Reviewed:    EKG:  No ECG reviewed.  Recent Labs: No results found for requested labs within last 8760 hours.   Recent Lipid Panel No results found for: CHOL, TRIG, HDL, CHOLHDL, LDLCALC, LDLDIRECT  From [employer] Tool:    Wt Readings from Last 3 Encounters:  09/06/18 165 lb (74.8 kg)  05/26/17 178 lb (80.7 kg)  03/14/17 178 lb (80.7 kg)     Objective:    Vital Signs:  BP 133/73    Pulse 81    Ht 5\' 11"  (1.803 m)    Wt 165 lb (74.8 kg)    SpO2 99%    BMI 23.01 kg/m    VITAL SIGNS:  reviewed GEN:  no acute distress RESPIRATORY:  normal respiratory effort, symmetric expansion CARDIOVASCULAR:  lower extremity edema noted NEURO:  alert and oriented x 3, no obvious focal deficit PSYCH:  normal affect  ASSESSMENT & PLAN:    Shortness of breath  Etiology not entirely clear.  It is difficult to form a differential dx given the limitations of audio/video technology.  In any event, his symptoms may be related to significantly elevated HRs.  His Zio monitor did show HRs in the 200s at times.  However, his average HR was in the 90s. He may have developed CHF.  However, Lasix did not improve his symptoms.  This may be an anginal equivalent or his mitral regurgitation (noted on prior echo) may have gotten worse.  As he is on chronic anticoagulation, we should rule out anemia as well.  I think  he should have an echocardiogram to better evaluate his symptoms.  I discussed this with Dr. Acie Fredrickson who agreed.    -Arrange 2D echo  -Arrange BMET, CBC, BNP  -FU in 2 weeks.   Persistent atrial fibrillation Question if his HR elevations are still contributing to his shortness of breath.  Increase Toprol to 50 mg twice daily.  Continue Apixaban.    Essential hypertension The patient's blood pressure is controlled on his Holder regimen.  Continue Holder therapy.    Mitral valve insufficiency, unspecified etiology Mild  to mod by echo in 2017.  Obtain echo as noted.    Time:   Today, I have spent 19 minutes with the patient with telehealth technology discussing the above problems.     Medication Adjustments/Labs and Tests Ordered: Holder medicines are reviewed at length with the patient today.  Concerns regarding medicines are outlined above.   Tests Ordered: Orders Placed This Encounter  Procedures   Basic metabolic panel   CBC with Differential/Platelet   Pro b natriuretic peptide (BNP)   ECHOCARDIOGRAM COMPLETE    Medication Changes: Meds ordered this encounter  Medications   metoprolol succinate (TOPROL-XL) 50 MG 24 hr tablet    Sig: Take 1 tablet (50 mg total) by mouth 2 (two) times a day. Take with or immediately following a meal.    Dispense:  180 tablet    Refill:  3    Dose increase.  Patient will call for refill.    Order Specific Question:   Supervising Provider    Answer:   Lelon Perla [[id number]]    Disposition:  Follow up in 2 week(s)  Signed, Richardson Dopp, PA-C  09/06/2018 5:54 PM    [place]

## 2018-09-06 ENCOUNTER — Other Ambulatory Visit: Payer: Self-pay | Admitting: *Deleted

## 2018-09-06 ENCOUNTER — Telehealth (INDEPENDENT_AMBULATORY_CARE_PROVIDER_SITE_OTHER): Payer: Medicare Other | Admitting: Physician Assistant

## 2018-09-06 ENCOUNTER — Telehealth: Payer: Self-pay | Admitting: Physician Assistant

## 2018-09-06 ENCOUNTER — Encounter: Payer: Self-pay | Admitting: Physician Assistant

## 2018-09-06 ENCOUNTER — Telehealth: Payer: Self-pay | Admitting: *Deleted

## 2018-09-06 VITALS — BP 133/73 | HR 81 | Ht 71.0 in | Wt 165.0 lb

## 2018-09-06 DIAGNOSIS — I4819 Other persistent atrial fibrillation: Secondary | ICD-10-CM | POA: Diagnosis not present

## 2018-09-06 DIAGNOSIS — R0602 Shortness of breath: Secondary | ICD-10-CM

## 2018-09-06 DIAGNOSIS — I10 Essential (primary) hypertension: Secondary | ICD-10-CM | POA: Diagnosis not present

## 2018-09-06 DIAGNOSIS — I340 Nonrheumatic mitral (valve) insufficiency: Secondary | ICD-10-CM

## 2018-09-06 MED ORDER — METOPROLOL SUCCINATE ER 50 MG PO TB24: 50.0000 mg | ORAL_TABLET | Freq: Two times a day (BID) | ORAL | 3 refills | Status: DC

## 2018-09-06 NOTE — Patient Instructions (Addendum)
Medication Instructions:  Increase Toprol (Metoprolol Succinate) to 50 mg twice daily  A new prescription was sent to your pharmacy  If you need a refill on your cardiac medications before your next appointment, please call your pharmacy.   Lab work: This week - BMET, CBC, BNP  If you have labs (blood work) drawn today and your tests are completely normal, you will receive your results only by: [name] MyChart Message (if you have MyChart) OR . A paper copy in the mail If you have any lab test that is abnormal or we need to change your treatment, we will call you to review the results.  Testing/Procedures: This week - We will try to schedule an Echocardiogram.  Your physician has requested that you have an echocardiogram. Echocardiography is a painless test that uses sound waves to create images of your heart. It provides your doctor with information about the size and shape of your heart and how well your heart's chambers and valves are working. This procedure takes approximately one hour. There are no restrictions for this procedure.  Follow-Up: At [facility], you and your health needs are our priority.  As part of our continuing mission to provide you with exceptional heart care, we have created designated Provider Care Teams.  These Care Teams include your primary Cardiologist (physician) and Advanced Practice Providers (APPs -  Physician Assistants and Nurse Practitioners) who all work together to provide you with the care you need, when you need it. . Dr. Acie Fredrickson or Richardson Dopp, PA-C in the next 2 weeks  Any Other Special Instructions Will Be Listed Below (If Applicable). Weigh daily If your weight goes up 3 lbs or more in 1 day or you notice your legs are more swollen, take a dose of Lasix 40 mg Call if you feel your shortness of breath is worsening

## 2018-09-06 NOTE — Telephone Encounter (Signed)
SPOKE WITH PT AND PT AWARE OF LAB WORK AND EKG   PT EXPLAINED ECHOCARDIOGRAM TO BE DONE ALSO ON 4-27  STILL HAS TO BE SCHEDULED  AND WILL RECEIVE AN PHONE CALL ABOUT THE SCHEDULING OF THAT.  PT R/S -5-6- 20 FOR 2 WEEK  FOLLOW UP

## 2018-09-06 NOTE — Telephone Encounter (Signed)
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

## 2018-09-08 ENCOUNTER — Telehealth: Payer: Self-pay | Admitting: *Deleted

## 2018-09-08 ENCOUNTER — Encounter (HOSPITAL_COMMUNITY): Payer: Self-pay | Admitting: Emergency Medicine

## 2018-09-08 ENCOUNTER — Other Ambulatory Visit: Payer: Self-pay

## 2018-09-08 ENCOUNTER — Emergency Department (HOSPITAL_COMMUNITY): Payer: Medicare Other

## 2018-09-08 ENCOUNTER — Inpatient Hospital Stay (HOSPITAL_COMMUNITY)
Admission: EM | Admit: 2018-09-08 | Discharge: 2018-09-12 | DRG: 812 | Disposition: A | Discharge status: Home / Self Care | Payer: Medicare Other | Attending: Internal Medicine | Admitting: Internal Medicine

## 2018-09-08 ENCOUNTER — Telehealth: Payer: Self-pay | Admitting: Physician Assistant

## 2018-09-08 DIAGNOSIS — D61818 Other pancytopenia: Secondary | ICD-10-CM | POA: Diagnosis present

## 2018-09-08 DIAGNOSIS — M25562 Pain in left knee: Secondary | ICD-10-CM

## 2018-09-08 DIAGNOSIS — E785 Hyperlipidemia, unspecified: Secondary | ICD-10-CM | POA: Diagnosis present

## 2018-09-08 DIAGNOSIS — D508 Other iron deficiency anemias: Secondary | ICD-10-CM | POA: Diagnosis not present

## 2018-09-08 DIAGNOSIS — R0902 Hypoxemia: Secondary | ICD-10-CM | POA: Diagnosis present

## 2018-09-08 DIAGNOSIS — I482 Chronic atrial fibrillation, unspecified: Secondary | ICD-10-CM | POA: Diagnosis present

## 2018-09-08 DIAGNOSIS — Z20828 Contact with and (suspected) exposure to other viral communicable diseases: Secondary | ICD-10-CM | POA: Diagnosis present

## 2018-09-08 DIAGNOSIS — I10 Essential (primary) hypertension: Secondary | ICD-10-CM | POA: Diagnosis present

## 2018-09-08 DIAGNOSIS — J9811 Atelectasis: Secondary | ICD-10-CM | POA: Diagnosis present

## 2018-09-08 DIAGNOSIS — Z905 Acquired absence of kidney: Secondary | ICD-10-CM

## 2018-09-08 DIAGNOSIS — D509 Iron deficiency anemia, unspecified: Secondary | ICD-10-CM | POA: Diagnosis present

## 2018-09-08 DIAGNOSIS — M25561 Pain in right knee: Secondary | ICD-10-CM

## 2018-09-08 DIAGNOSIS — Z7901 Long term (current) use of anticoagulants: Secondary | ICD-10-CM

## 2018-09-08 DIAGNOSIS — D649 Anemia, unspecified: Secondary | ICD-10-CM | POA: Diagnosis present

## 2018-09-08 DIAGNOSIS — Z79899 Other long term (current) drug therapy: Secondary | ICD-10-CM | POA: Diagnosis not present

## 2018-09-08 DIAGNOSIS — K219 Gastro-esophageal reflux disease without esophagitis: Secondary | ICD-10-CM | POA: Diagnosis present

## 2018-09-08 DIAGNOSIS — D500 Iron deficiency anemia secondary to blood loss (chronic): Principal | ICD-10-CM | POA: Diagnosis present

## 2018-09-08 DIAGNOSIS — I119 Hypertensive heart disease without heart failure: Secondary | ICD-10-CM | POA: Diagnosis present

## 2018-09-08 DIAGNOSIS — Z8249 Family history of ischemic heart disease and other diseases of the circulatory system: Secondary | ICD-10-CM | POA: Diagnosis not present

## 2018-09-08 DIAGNOSIS — Z87891 Personal history of nicotine dependence: Secondary | ICD-10-CM | POA: Diagnosis not present

## 2018-09-08 LAB — COMPREHENSIVE METABOLIC PANEL
ALT: 18 U/L (ref 0–44)
AST: 29 U/L (ref 15–41)
Albumin: 4.1 g/dL (ref 3.5–5.0)
Alkaline Phosphatase: 62 U/L (ref 38–126)
Anion gap: 6 (ref 5–15)
BUN: 32 mg/dL — ABNORMAL HIGH (ref 8–23)
CO2: 23 mmol/L (ref 22–32)
Calcium: 8.5 mg/dL — ABNORMAL LOW (ref 8.9–10.3)
Chloride: 109 mmol/L (ref 98–111)
Creatinine, Ser: 1.31 mg/dL — ABNORMAL HIGH (ref 0.61–1.24)
GFR calc Af Amer: 58 mL/min — ABNORMAL LOW (ref >60)
GFR calc non Af Amer: 50 mL/min — ABNORMAL LOW (ref >60)
Glucose, Bld: 106 mg/dL — ABNORMAL HIGH (ref 70–99)
Potassium: 4.2 mmol/L (ref 3.5–5.1)
Sodium: 138 mmol/L (ref 135–145)
Total Bilirubin: 1.1 mg/dL (ref 0.3–1.2)
Total Protein: 6.3 g/dL — ABNORMAL LOW (ref 6.5–8.1)

## 2018-09-08 LAB — CBC WITH DIFFERENTIAL/PLATELET
Abs Immature Granulocytes: 0.01 10*3/uL (ref 0.00–0.07)
Basophils Absolute: 0 10*3/uL (ref 0.0–0.1)
Basophils Relative: 1 %
Eosinophils Absolute: 0.1 10*3/uL (ref 0.0–0.5)
Eosinophils Relative: 2 %
HCT: 22 % — ABNORMAL LOW (ref 39.0–52.0)
Hemoglobin: 6 g/dL — CL (ref 13.0–17.0)
Immature Granulocytes: 0 %
Lymphocytes Relative: 27 %
Lymphs Abs: 0.7 10*3/uL (ref 0.7–4.0)
MCH: 20.9 pg — ABNORMAL LOW (ref 26.0–34.0)
MCHC: 27.3 g/dL — ABNORMAL LOW (ref 30.0–36.0)
MCV: 76.7 fL — ABNORMAL LOW (ref 80.0–100.0)
Monocytes Absolute: 0.6 10*3/uL (ref 0.1–1.0)
Monocytes Relative: 23 %
Neutro Abs: 1.2 10*3/uL — ABNORMAL LOW (ref 1.7–7.7)
Neutrophils Relative %: 47 %
Platelets: 156 10*3/uL (ref 150–400)
RBC: 2.87 MIL/uL — ABNORMAL LOW (ref 4.22–5.81)
RDW: 21.7 % — ABNORMAL HIGH (ref 11.5–15.5)
WBC: 2.6 10*3/uL — ABNORMAL LOW (ref 4.0–10.5)
nRBC: 0 % (ref 0.0–0.2)

## 2018-09-08 LAB — TRIGLYCERIDES: Triglycerides: 31 mg/dL (ref <150)

## 2018-09-08 LAB — PREPARE RBC (CROSSMATCH)

## 2018-09-08 LAB — VITAMIN B12: Vitamin B-12: 325 pg/mL (ref 180–914)

## 2018-09-08 LAB — C-REACTIVE PROTEIN: CRP: <0.8 mg/dL (ref <1.0)

## 2018-09-08 LAB — IRON AND TIBC
Iron: 7 ug/dL — ABNORMAL LOW (ref 45–182)
Saturation Ratios: 2 % — ABNORMAL LOW (ref 17.9–39.5)
TIBC: 420 ug/dL (ref 250–450)
UIBC: 413 ug/dL

## 2018-09-08 LAB — FIBRINOGEN: Fibrinogen: 262 mg/dL (ref 210–475)

## 2018-09-08 LAB — PROCALCITONIN: Procalcitonin: <0.1 ng/mL

## 2018-09-08 LAB — LACTATE DEHYDROGENASE: LDH: 165 U/L (ref 98–192)

## 2018-09-08 LAB — FERRITIN: Ferritin: 7 ng/mL — ABNORMAL LOW (ref 24–336)

## 2018-09-08 LAB — SARS CORONAVIRUS 2 BY RT PCR (HOSPITAL ORDER, PERFORMED IN ~~LOC~~ HOSPITAL LAB): SARS Coronavirus 2: NEGATIVE

## 2018-09-08 LAB — ABO/RH: ABO/RH(D): O POS

## 2018-09-08 LAB — DIRECT ANTIGLOBULIN TEST (NOT AT ARMC)
DAT, IgG: NEGATIVE
DAT, complement: NEGATIVE

## 2018-09-08 LAB — POC OCCULT BLOOD, ED: Fecal Occult Bld: NEGATIVE

## 2018-09-08 LAB — TROPONIN I: Troponin I: <0.03 ng/mL (ref <0.03)

## 2018-09-08 LAB — LACTIC ACID, PLASMA: Lactic Acid, Venous: 1.5 mmol/L (ref 0.5–1.9)

## 2018-09-08 LAB — D-DIMER, QUANTITATIVE (NOT AT ARMC): D-Dimer, Quant: 2.71 ug/mL-FEU — ABNORMAL HIGH (ref 0.00–0.50)

## 2018-09-08 LAB — BRAIN NATRIURETIC PEPTIDE: B Natriuretic Peptide: 511.2 pg/mL — ABNORMAL HIGH (ref 0.0–100.0)

## 2018-09-08 MED ORDER — METOPROLOL SUCCINATE ER 50 MG PO TB24
50.0000 mg | ORAL_TABLET | Freq: Two times a day (BID) | ORAL | Status: DC
  Administered 2018-09-08 – 2018-09-12 (×8): 50 mg via ORAL
  Filled 2018-09-08 (×8): qty 1

## 2018-09-08 MED ORDER — SODIUM CHLORIDE 0.9% FLUSH
3.0000 mL | Freq: Two times a day (BID) | INTRAVENOUS | Status: DC
  Administered 2018-09-10 – 2018-09-11 (×2): 3 mL via INTRAVENOUS

## 2018-09-08 MED ORDER — ACETAMINOPHEN 650 MG RE SUPP: 650.0000 mg | Freq: Four times a day (QID) | RECTAL | Status: DC | PRN

## 2018-09-08 MED ORDER — SODIUM CHLORIDE 0.9 % IV SOLN
1.0000 g | Freq: Once | INTRAVENOUS | Status: AC
  Administered 2018-09-08: 1 g via INTRAVENOUS
  Filled 2018-09-08: qty 10

## 2018-09-08 MED ORDER — SODIUM CHLORIDE 0.9% FLUSH: 3.0000 mL | INTRAVENOUS | Status: DC | PRN

## 2018-09-08 MED ORDER — AZITHROMYCIN 250 MG PO TABS
500.0000 mg | ORAL_TABLET | Freq: Once | ORAL | Status: AC
  Administered 2018-09-08: 500 mg via ORAL
  Filled 2018-09-08: qty 2

## 2018-09-08 MED ORDER — ONDANSETRON HCL 4 MG PO TABS: 4.0000 mg | ORAL_TABLET | Freq: Four times a day (QID) | ORAL | Status: DC | PRN

## 2018-09-08 MED ORDER — ACETAMINOPHEN 325 MG PO TABS: 650.0000 mg | ORAL_TABLET | Freq: Four times a day (QID) | ORAL | Status: DC | PRN

## 2018-09-08 MED ORDER — IOHEXOL 350 MG/ML SOLN
100.0000 mL | Freq: Once | INTRAVENOUS | Status: AC | PRN
  Administered 2018-09-08: 100 mL via INTRAVENOUS

## 2018-09-08 MED ORDER — SODIUM CHLORIDE 0.9% IV SOLUTION: Freq: Once | INTRAVENOUS | Status: DC

## 2018-09-08 MED ORDER — HYDROCODONE-ACETAMINOPHEN 5-325 MG PO TABS: 1.0000 | ORAL_TABLET | ORAL | Status: DC | PRN

## 2018-09-08 MED ORDER — ONDANSETRON HCL 4 MG/2ML IJ SOLN: 4.0000 mg | Freq: Four times a day (QID) | INTRAMUSCULAR | Status: DC | PRN

## 2018-09-08 MED ORDER — ALPRAZOLAM 0.25 MG PO TABS: 0.2500 mg | ORAL_TABLET | Freq: Four times a day (QID) | ORAL | Status: DC | PRN

## 2018-09-08 MED ORDER — SODIUM CHLORIDE (PF) 0.9 % IJ SOLN
INTRAMUSCULAR | Status: AC
  Filled 2018-09-08: qty 50

## 2018-09-08 MED ORDER — PANTOPRAZOLE SODIUM 40 MG PO TBEC
40.0000 mg | DELAYED_RELEASE_TABLET | Freq: Every day | ORAL | Status: DC
  Administered 2018-09-09 – 2018-09-12 (×4): 40 mg via ORAL
  Filled 2018-09-08 (×4): qty 1

## 2018-09-08 NOTE — Telephone Encounter (Signed)
LMOVM TO CALL CLINIC BACK IN REFERENCE TO RESCHEDULE APPT

## 2018-09-08 NOTE — ED Provider Notes (Addendum)
[facility] DEPT Provider Note   CSN: [id number] Arrival date & time: 09/08/18  1507    History   Chief Complaint Chief Complaint  Patient presents with  . Shortness of Breath    HPI John Holder is a 85 y.o. male.     The history is provided by the patient and medical records. No language interpreter was used.  Shortness of Breath     85 year old male with history of atrial fibrillation currently on Eliquis, presenting complaining of shortness of breath.  Patient report for the past month he has had intermittent shortness of breath.  He first noticed it after he was playing tennis and realized that he was more out of breath than usual.  Onset was acute and sudden.  He mention throughout the day when he is not exerting himself, he felt fine however whenever he walks his dog or performing any strenuous activities he endorsed shortness of breath.  He recently bought an oximeter to check his oxygen today noticed that his oxygen was at 60% while he was walking his dog.  He did report feeling short of breath at that time.  Patient did mention having some bilateral ankle swelling approximately a month ago that his doctor is aware and he was placed on a fluid pill as well as using compression stocking.  He mentioned the stocking has helped with his swelling and he is no longer taking fluid pill.  He denies any associated fever chills, no productive cough, lightheadedness, dizziness, chest pain, flulike symptoms, loss of sense of taste or smell, nausea vomiting diarrhea, change in bowel movement, bloody bowel movement or any abnormal bleeding.  He denies any prior history of PE or DVT.  He denies any heart palpitation.  No prior history of CHF.   Past Medical History:  Diagnosis Date  . Atrial fibrillation [facility])     Patient Active Problem List   Diagnosis Date Noted  . Knee pain, bilateral 09/30/2014    Past Surgical History:  Procedure Laterality Date  .  CARDIOVERSION N/A 02/04/2016   Procedure: CARDIOVERSION;  Surgeon: Thayer Headings, MD;  Location: [facility] ENDOSCOPY;  Service: Cardiovascular;  Laterality: N/A;        Home Medications    Prior to Admission medications   Medication Sig Start Date End Date Taking? Authorizing Provider  ALPRAZolam (XANAX) 0.25 MG tablet Take 0.25 mg by mouth every 6 (six) hours as needed for anxiety.  07/22/14   [provider]  Barberry-Oreg Grape-Goldenseal (BERBERINE COMPLEX PO) Take 600 mg by mouth 2 (two) times a day.    [provider]  ELIQUIS 5 MG TABS tablet Take 5 mg by mouth 2 (two) times daily.  12/29/15   [provider]  glucosamine-chondroitin 500-400 MG tablet Take 1 tablet by mouth 2 (two) times daily.     [provider]  losartan (COZAAR) 50 MG tablet Take 1 tablet (50 mg total) by mouth daily. 08/11/18   Nahser, Wonda Cheng, MD  metoprolol succinate (TOPROL-XL) 50 MG 24 hr tablet Take 1 tablet (50 mg total) by mouth 2 (two) times a day. Take with or immediately following a meal. 09/06/18 09/06/19  Richardson Dopp T, PA-C  Multiple Vitamin (MULTIVITAMIN) capsule Take 1 capsule by mouth daily.    [provider]  omega-3 acid ethyl esters (LOVAZA) 1 G capsule Take 1 g by mouth 2 (two) times daily.    [provider]  omeprazole (PRILOSEC) 20 MG capsule Take  20 mg by mouth daily. 07/27/14   [provider]  saw palmetto 160 MG capsule Take 160 mg by mouth 2 (two) times daily.    [provider]    Family History No family history on file.  Social History Social History   Tobacco Use  . Smoking status: Never Smoker  . Smokeless tobacco: Never Used  Substance Use Topics  . Alcohol use: Yes    Alcohol/week: 0.0 standard drinks    Comment: occasional  . Drug use: No     Allergies   Patient has no known allergies.   Review of Systems Review of Systems  Respiratory: Positive for shortness of breath.   All other systems  reviewed and are negative.    Physical Exam Updated Vital Signs BP 132/79 (BP Location: Left Arm)   Pulse 85   Temp 98.1 F (36.7 C) (Oral)   Resp 18   SpO2 (!) 84%   Physical Exam Vitals signs and nursing note reviewed.  Constitutional:      General: He is not in acute distress.    Appearance: He is well-developed.     Comments: Elderly male resting comfortably in no acute discomfort.  HENT:     Head: Atraumatic.  Eyes:     Conjunctiva/sclera: Conjunctivae normal.  Neck:     Musculoskeletal: Neck supple.  Cardiovascular:     Rate and Rhythm: Rhythm irregular.     Heart sounds: No murmur.  Pulmonary:     Effort: Pulmonary effort is normal.     Breath sounds: Normal breath sounds. No decreased breath sounds, wheezing, rhonchi or rales.  Chest:     Chest wall: No tenderness.  Genitourinary:    Rectum: Guaiac result negative.     Comments: Brown color stool, fecal occult blood test negative. Musculoskeletal:     Right lower leg: Edema (Trace pitting edema to lower extremity bilaterally) present.  Skin:    Capillary Refill: Capillary refill takes less than 2 seconds.     Findings: No rash.  Neurological:     Mental Status: He is alert and oriented to person, place, and time.  Psychiatric:        Mood and Affect: Mood normal.      ED Treatments / Results  Labs (all labs ordered are listed, but only abnormal results are displayed) Labs Reviewed  BRAIN NATRIURETIC PEPTIDE - Abnormal; Notable for the following components:      Result Value   B Natriuretic Peptide 511.2 (*)    All other components within normal limits  CBC WITH DIFFERENTIAL/PLATELET - Abnormal; Notable for the following components:   WBC 2.6 (*)    RBC 2.87 (*)    Hemoglobin 6.0 (*)    HCT 22.0 (*)    MCV 76.7 (*)    MCH 20.9 (*)    MCHC 27.3 (*)    RDW 21.7 (*)    Neutro Abs 1.2 (*)    All other components within normal limits  D-DIMER, QUANTITATIVE (NOT AT [facility]) - Abnormal; Notable for the  following components:   D-Dimer, Quant 2.71 (*)    All other components within normal limits  COMPREHENSIVE METABOLIC PANEL - Abnormal; Notable for the following components:   Glucose, Bld 106 (*)    BUN 32 (*)    Creatinine, Ser 1.31 (*)    Calcium 8.5 (*)    Total Protein 6.3 (*)    GFR calc non Af Amer 50 (*)    GFR calc Af  Amer 58 (*)    All other components within normal limits  FERRITIN - Abnormal; Notable for the following components:   Ferritin 7 (*)    All other components within normal limits  SARS CORONAVIRUS 2 (HOSPITAL ORDER, PERFORMED IN [place] LAB)  CULTURE, BLOOD (ROUTINE X 2)  CULTURE, BLOOD (ROUTINE X 2)  TROPONIN I  LACTIC ACID, PLASMA  PROCALCITONIN  LACTATE DEHYDROGENASE  TRIGLYCERIDES  FIBRINOGEN  C-REACTIVE PROTEIN  LACTIC ACID, PLASMA  CBC  POC OCCULT BLOOD, ED  TYPE AND SCREEN  PREPARE RBC (CROSSMATCH)  ABO/RH    EKG EKG Interpretation  Date/Time:  Friday September 08 2018 15:22:11 EDT Ventricular Rate:  85 PR Interval:    QRS Duration: 121 QT Interval:  388 QTC Calculation: 462 R Axis:   66 Text Interpretation:  Atrial fibrillation IVCD, consider atypical RBBB Non-specific ST-t changes Confirmed by Virgel Manifold [phone]) on 09/08/2018 4:32:26 PM   Radiology Ct Angio Chest Pe W And/or Wo Contrast  Result Date: 09/08/2018 CLINICAL DATA:  Shortness of breath EXAM: CT ANGIOGRAPHY CHEST WITH CONTRAST TECHNIQUE: Multidetector CT imaging of the chest was performed using the standard protocol during bolus administration of intravenous contrast. Multiplanar CT image reconstructions and MIPs were obtained to evaluate the vascular anatomy. CONTRAST:  193mL OMNIPAQUE IOHEXOL 350 MG/ML SOLN COMPARISON:  Chest x-ray earlier today. FINDINGS: Cardiovascular: Cardiomegaly. Aortic atherosclerosis. No filling defects in the pulmonary arteries to suggest pulmonary emboli. Mediastinum/Nodes: No enlarged mediastinal, hilar, or axillary lymph nodes.  Thyroid gland, trachea, and esophagus demonstrate no significant findings. Lungs/Pleura: Dependent bibasilar atelectasis. No confluent opacities otherwise. No effusions. Upper Abdomen: Imaging into the upper abdomen shows no acute findings. Musculoskeletal: Chest wall soft tissues are unremarkable. No acute bony abnormality. Review of the MIP images confirms the above findings. IMPRESSION: No evidence of pulmonary embolus. Cardiomegaly. Dependent/bibasilar atelectasis. Aortic Atherosclerosis (ICD10-I70.0). Electronically Signed   By: Rolm Baptise M.D.   On: 09/08/2018 19:27   Dg Chest Port 1 View  Result Date: 09/08/2018 CLINICAL DATA:  Shortness of breath EXAM: PORTABLE CHEST 1 VIEW COMPARISON:  07/28/2016 FINDINGS: Cardiomegaly with small left pleural effusion, central vascular congestion and mild edema. Patchy atelectasis or minimal infiltrate at the left base. No pneumothorax. IMPRESSION: 1. Cardiomegaly with vascular congestion, mild interstitial edema and small left effusion 2. Patchy atelectasis or minimal infiltrate at the left base Electronically Signed   By: Donavan Foil M.D.   On: 09/08/2018 16:13    Procedures .Critical Care Performed by: Domenic Moras, PA-C Authorized by: Domenic Moras, PA-C   Critical care provider statement:    Critical care time (minutes):  45   Critical care was time spent personally by me on the following activities:  Discussions with consultants, evaluation of patient's response to treatment, examination of patient, ordering and performing treatments and interventions, ordering and review of laboratory studies, ordering and review of radiographic studies, pulse oximetry, re-evaluation of patient's condition, obtaining history from patient or surrogate and review of old charts   (including critical care time)  Medications Ordered in ED Medications  0.9 %  sodium chloride infusion (Manually program via Guardrails IV Fluids) (has no administration in time range)   sodium chloride (PF) 0.9 % injection (has no administration in time range)  cefTRIAXone (ROCEPHIN) 1 g in sodium chloride 0.9 % 100 mL IVPB (0 g Intravenous Stopped 09/08/18 1811)  azithromycin (ZITHROMAX) tablet 500 mg (500 mg Oral Given 09/08/18 1702)  iohexol (OMNIPAQUE) 350 MG/ML injection 100 mL (100 mLs Intravenous  Contrast Given 09/08/18 1904)     Initial Impression / Assessment and Plan / ED Course  I have reviewed the triage vital signs and the nursing notes.  Pertinent labs & imaging results that were available during my care of the patient were reviewed by me and considered in my medical decision making (see chart for details).        BP 132/79 (BP Location: Left Arm)   Pulse 85   Temp 98.1 F (36.7 C) (Oral)   Resp 18   SpO2 (!) 84%    Final Clinical Impressions(s) / ED Diagnoses   Final diagnoses:  Symptomatic anemia  Hypoxia    ED Discharge Orders    None     3:43 PM Patient report acute onset of exertional dyspnea and shortness of breath ongoing for the past month.  No COVID-19-like symptom however pt does not have any prior significant lung disease to explain his hypoxia.  He did have a documented oxygen of 84% on room air on initial arrival, O2 sats improved to 98% with 2 L.  Work up initiated, will initiate COVID-19 screening.   5:23 PM Labs remarkable for you hemoglobin of 6.0 which is a significant drop from prior.  He is currently on Eliquis therefore we will check a Hemoccult to assess for occult bleeding.  He also found to have an elevated d-dimer of 2.71.  His chest x-ray shows left infiltrate/atelectasis.  Patient placed on Rocephin and Zithromax to cover for CAP.  May consider Kcentra as anticoagulation reversal if Hemoccult is positive.  Care discussed with Dr. Wilson Singer.   6:11 PM BNP is elevated at 511, no prior value for comparison.  His CBC is suggestive of potential pancytopenia.  Fortunately his fecal occult blood test is negative.  Mild impaired  kidney function with BUN 13, creatinine 1.31.  Inflammatory markers for COVID-19 has been unremarkable thus far.  7:50 PM COVID-19 test is negative.  Chest CTA without evidence of PE.  NO focal infiltrates on CT as well.  WIll consult for admission.    8:34 PM Appreciate consultation from [facility] Dr. Roel Cluck who agrees to see and admit pt for further management of his condition. Pt is currently receiving blood transfusion.   9:10 PM Pt's son, Richardson Landry [phone]), request to be kept informed of his father's condition.  I have spoken with him via the phone tonight and assured admitting staff will also reach out to give update when appropriate.    Richardson Landry also request for [place] to help assigning him as Power of Attorney in the event that Mr. Mccaskey needs it.  I will relay this message to DR. Alberteen Spindle, PA-C 09/08/18 2142    Virgel Manifold, MD 09/08/18 2257

## 2018-09-08 NOTE — ED Notes (Signed)
Date and time results received: 09/08/18 5:17 PM  (use smartphrase ".now" to insert current time)  Test: hgb Critical Value: 6.0  Name of Provider Notified: Abbie RN   Orders Received? Or Actions Taken?:

## 2018-09-08 NOTE — ED Triage Notes (Signed)
Pt states that he has had continuing SOB x 1 month, worse on exertion. Hx of afib. Denies sick contacts. Alert and oriented. Some swelling noted in bilateral legs.

## 2018-09-08 NOTE — ED Notes (Signed)
ED TO INPATIENT HANDOFF REPORT  ED Nurse Name and Phone #: [phone]   S Name/Age/Gender John Holder 85 y.o. male Room/Bed: WA05/WA05  Code Status   Code Status: Not on file  Home/SNF/Other Home Patient oriented to: place Is this baseline? Yes   Triage Complete: Triage complete  Chief Complaint low O2-dyspnea upon exertion  Triage Note Pt states that he has had continuing SOB x 1 month, worse on exertion. Hx of afib. Denies sick contacts. Alert and oriented. Some swelling noted in bilateral legs.    Allergies No Known Allergies  Level of Care/Admitting Diagnosis ED Disposition    ED Disposition Condition Comment   Admit  Hospital Area: [place] [[id number]]  Level of Care: Telemetry [5]  Admit to tele based on following criteria: Monitor for Ischemic changes  Covid Evaluation: N/A  Diagnosis: Symptomatic anemia [[id number]]  Admitting Physician: Toy Baker [[id number]]  Attending Physician: Toy Baker [[id number]]  Estimated length of stay: 3 - 4 days  Certification:: I certify this patient will need inpatient services for at least 2 midnights  PT Class (Do Not Modify): Inpatient [101]  PT Acc Code (Do Not Modify): Private [1]       B Medical/Surgery History Past Medical History:  Diagnosis Date  . Atrial fibrillation [facility])    Past Surgical History:  Procedure Laterality Date  . CARDIOVERSION N/A 02/04/2016   Procedure: CARDIOVERSION;  Surgeon: Thayer Headings, MD;  Location: [place];  Service: Cardiovascular;  Laterality: N/A;     A IV Location/Drains/Wounds Patient Lines/Drains/Airways Status   Active Line/Drains/Airways    Name:   Placement date:   Placement time:   Site:   Days:   Peripheral IV 09/08/18 Left Forearm   09/08/18    1630    Forearm   less than 1          Intake/Output Last 24 hours  Intake/Output Summary (Last 24 hours) at 09/08/2018 2323 Last data filed at 09/08/2018 2205 Gross per 24 hour  Intake 680  ml  Output -  Net 680 ml    Labs/Imaging Results for orders placed or performed during the hospital encounter of 09/08/18 (from the past 48 hour(s))  SARS Coronavirus 2 [facility] order, Performed in [place] hospital lab)     Status: None   Collection Time: 09/08/18  3:47 PM  Result Value Ref Range   SARS Coronavirus 2 NEGATIVE NEGATIVE    Comment: (NOTE) If result is NEGATIVE SARS-CoV-2 target nucleic acids are NOT DETECTED. The SARS-CoV-2 RNA is generally detectable in upper and lower  respiratory specimens during the acute phase of infection. The lowest  concentration of SARS-CoV-2 viral copies this assay can detect is 250  copies / mL. A negative result does not preclude SARS-CoV-2 infection  and should not be used as the sole basis for treatment or other  patient management decisions.  A negative result may occur with  improper specimen collection / handling, submission of specimen other  than nasopharyngeal swab, presence of viral mutation(s) within the  areas targeted by this assay, and inadequate number of viral copies  (<250 copies / mL). A negative result must be combined with clinical  observations, patient history, and epidemiological information. If result is POSITIVE SARS-CoV-2 target nucleic acids are DETECTED. The SARS-CoV-2 RNA is generally detectable in upper and lower  respiratory specimens dur ing the acute phase of infection.  Positive  results are indicative of active infection with SARS-CoV-2.  Clinical  correlation with  patient history and other diagnostic information is  necessary to determine patient infection status.  Positive results do  not rule out bacterial infection or co-infection with other viruses. If result is PRESUMPTIVE POSTIVE SARS-CoV-2 nucleic acids MAY BE PRESENT.   A presumptive positive result was obtained on the submitted specimen  and confirmed on repeat testing.  While 2019 novel coronavirus  (SARS-CoV-2) nucleic acids may be  present in the submitted sample  additional confirmatory testing may be necessary for epidemiological  and / or clinical management purposes  to differentiate between  SARS-CoV-2 and other Sarbecovirus currently known to infect humans.  If clinically indicated additional testing with an alternate test  methodology [phone]) is advised. The SARS-CoV-2 RNA is generally  detectable in upper and lower respiratory sp ecimens during the acute  phase of infection. The expected result is Negative. Fact Sheet for Patients:  StrictlyIdeas.no Fact Sheet for Healthcare Providers: BankingDealers.co.za This test is not yet approved or cleared by the [place] FDA and has been authorized for detection and/or diagnosis of SARS-CoV-2 by FDA under an Emergency Use Authorization (EUA).  This EUA will remain in effect (meaning this test can be used) for the duration of the COVID-19 declaration under Section 564(b)(1) of the Act, 21 U.S.C. section 360bbb-3(b)(1), unless the authorization is terminated or revoked sooner. Performed at [facility], [place] [address], [place], [place] [address]   Lactic acid, plasma     Status: None   Collection Time: 09/08/18  3:47 PM  Result Value Ref Range   Lactic Acid, Venous 1.5 0.5 - 1.9 mmol/L    Comment: Performed at [facility], [place] [address], [place], [place] [address]  Blood Culture (routine x 2)     Status: None (Preliminary result)   Collection Time: 09/08/18  3:47 PM  Result Value Ref Range   Specimen Description      BLOOD LEFT FOREARM Performed at [facility], [place] [address], [place], [place] [address]    Special Requests      BOTTLES DRAWN AEROBIC AND ANAEROBIC Blood Culture adequate volume Performed at [place] [address] [place], [place] [address]    Culture PENDING    Report Status PENDING   Brain natriuretic peptide      Status: Abnormal   Collection Time: 09/08/18  3:56 PM  Result Value Ref Range   B Natriuretic Peptide 511.2 (H) 0.0 - 100.0 pg/mL    Comment: Performed at [facility], [place] [address], [place], [place] [address]  CBC with Differential/Platelet     Status: Abnormal   Collection Time: 09/08/18  3:56 PM  Result Value Ref Range   WBC 2.6 (L) 4.0 - 10.5 K/uL   RBC 2.87 (L) 4.22 - 5.81 MIL/uL   Hemoglobin 6.0 (LL) 13.0 - 17.0 g/dL    Comment: REPEATED TO VERIFY Reticulocyte Hemoglobin testing may be clinically indicated, consider ordering this additional test [id number] THIS CRITICAL RESULT HAS VERIFIED AND BEEN CALLED TO C.HALL BY NATHAN THOMPSON ON 04 24 2020 AT 1716, AND HAS BEEN READ BACK. CRITICAL RESULT VERIFIED    HCT 22.0 (L) 39.0 - 52.0 %   MCV 76.7 (L) 80.0 - 100.0 fL   MCH 20.9 (L) 26.0 - 34.0 pg   MCHC 27.3 (L) 30.0 - 36.0 g/dL   RDW 21.7 (H) 11.5 - 15.5 %   Platelets 156 150 - 400 K/uL   nRBC 0.0 0.0 - 0.2 %  Neutrophils Relative % 47 %   Neutro Abs 1.2 (L) 1.7 - 7.7 K/uL   Lymphocytes Relative 27 %   Lymphs Abs 0.7 0.7 - 4.0 K/uL   Monocytes Relative 23 %   Monocytes Absolute 0.6 0.1 - 1.0 K/uL   Eosinophils Relative 2 %   Eosinophils Absolute 0.1 0.0 - 0.5 K/uL   Basophils Relative 1 %   Basophils Absolute 0.0 0.0 - 0.1 K/uL   Immature Granulocytes 0 %   Abs Immature Granulocytes 0.01 0.00 - 0.07 K/uL   Schistocytes PRESENT    Tear Drop Cells PRESENT    Polychromasia PRESENT    Ovalocytes PRESENT     Comment: Performed at [facility], [place] [address], [place], [place] [address]  Troponin I - ONCE - STAT     Status: None   Collection Time: 09/08/18  3:56 PM  Result Value Ref Range   Troponin I <0.03 <0.03 ng/mL    Comment: Performed at [facility], [place] [address] [place], [place] [address]  D-dimer, quantitative (not at [facility])     Status: Abnormal   Collection Time: 09/08/18  3:56 PM  Result Value Ref  Range   D-Dimer, Quant 2.71 (H) 0.00 - 0.50 ug/mL-FEU    Comment: (NOTE) At the manufacturer cut-off of 0.50 ug/mL FEU, this assay has been documented to exclude PE with a sensitivity and negative predictive value of 97 to 99%.  At this time, this assay has not been approved by the FDA to exclude DVT/VTE. Results should be correlated with clinical presentation. Performed at [facility], [place] [address], [place], [place] [address]   Comprehensive metabolic panel     Status: Abnormal   Collection Time: 09/08/18  3:56 PM  Result Value Ref Range   Sodium 138 135 - 145 mmol/L   Potassium 4.2 3.5 - 5.1 mmol/L   Chloride 109 98 - 111 mmol/L   CO2 23 22 - 32 mmol/L   Glucose, Bld 106 (H) 70 - 99 mg/dL   BUN 32 (H) 8 - 23 mg/dL   Creatinine, Ser 1.31 (H) 0.61 - 1.24 mg/dL   Calcium 8.5 (L) 8.9 - 10.3 mg/dL   Total Protein 6.3 (L) 6.5 - 8.1 g/dL   Albumin 4.1 3.5 - 5.0 g/dL   AST 29 15 - 41 U/L   ALT 18 0 - 44 U/L   Alkaline Phosphatase 62 38 - 126 U/L   Total Bilirubin 1.1 0.3 - 1.2 mg/dL   GFR calc non Af Amer 50 (L) >60 mL/min   GFR calc Af Amer 58 (L) >60 mL/min   Anion gap 6 5 - 15    Comment: Performed at [facility], [place] [address], [place], [place] [address]  Procalcitonin     Status: None   Collection Time: 09/08/18  3:56 PM  Result Value Ref Range   Procalcitonin <0.10 ng/mL    Comment:        Interpretation: PCT (Procalcitonin) <= 0.5 ng/mL: Systemic infection (sepsis) is not likely. Local bacterial infection is possible. (NOTE)       Sepsis PCT Algorithm           Lower Respiratory Tract                                      Infection PCT Algorithm    ----------------------------     ----------------------------  PCT < 0.25 ng/mL                PCT < 0.10 ng/mL         Strongly encourage             Strongly discourage   discontinuation of antibiotics    initiation of antibiotics    ----------------------------      -----------------------------       PCT 0.25 - 0.50 ng/mL            PCT 0.10 - 0.25 ng/mL               OR       >80% decrease in PCT            Discourage initiation of                                            antibiotics      Encourage discontinuation           of antibiotics    ----------------------------     -----------------------------         PCT >= 0.50 ng/mL              PCT 0.26 - 0.50 ng/mL               AND        <80% decrease in PCT             Encourage initiation of                                             antibiotics       Encourage continuation           of antibiotics    ----------------------------     -----------------------------        PCT >= 0.50 ng/mL                  PCT > 0.50 ng/mL               AND         increase in PCT                  Strongly encourage                                      initiation of antibiotics    Strongly encourage escalation           of antibiotics                                     -----------------------------                                           PCT <= 0.25 ng/mL  OR                                        > 80% decrease in PCT                                     Discontinue / Do not initiate                                             antibiotics Performed at [place] [address], [place], [place] [address]   Lactate dehydrogenase     Status: None   Collection Time: 09/08/18  3:56 PM  Result Value Ref Range   LDH 165 98 - 192 U/L    Comment: Performed at [facility], [place] [address] [place], [place] [address]  Ferritin     Status: Abnormal   Collection Time: 09/08/18  3:56 PM  Result Value Ref Range   Ferritin 7 (L) 24 - 336 ng/mL    Comment: Performed at [facility], [place] [address] [place], [place] [address]  Triglycerides     Status: None   Collection Time: 09/08/18  3:56 PM  Result  Value Ref Range   Triglycerides 31 <150 mg/dL    Comment: Performed at [facility], [place] [address], [place], [place] [address]  Fibrinogen     Status: None   Collection Time: 09/08/18  3:56 PM  Result Value Ref Range   Fibrinogen 262 210 - 475 mg/dL    Comment: Performed at [facility], [place] [address] [place], [place] [address]  C-reactive protein     Status: None   Collection Time: 09/08/18  3:56 PM  Result Value Ref Range   CRP <0.8 <1.0 mg/dL    Comment: Performed at [facility], [place] [address], [place], [place] [address]  Vitamin B12     Status: None   Collection Time: 09/08/18  3:56 PM  Result Value Ref Range   Vitamin B-12 325 180 - 914 pg/mL    Comment: (NOTE) This assay is not validated for testing neonatal or myeloproliferative syndrome specimens for Vitamin B12 levels. Performed at [facility], [place] [address], [place], [place] [address]   Iron and TIBC     Status: Abnormal   Collection Time: 09/08/18  3:56 PM  Result Value Ref Range   Iron 7 (L) 45 - 182 ug/dL   TIBC 420 250 - 450 ug/dL   Saturation Ratios 2 (L) 17.9 - 39.5 %   UIBC 413 ug/dL    Comment: Performed at [facility], [place] [address] [place], [place] [address]  Type and screen     Status: None (Preliminary result)   Collection Time: 09/08/18  5:23 PM  Result Value Ref Range   ABO/RH(D) O POS    Antibody Screen NEG    Sample Expiration 09/11/2018    Unit Number [id number]    Blood Component Type RED CELLS,LR    Unit division 00    Status of Unit ISSUED    Transfusion Status OK TO TRANSFUSE    Crossmatch Result Compatible    Unit  Number [id number]    Blood Component Type RED CELLS,LR    Unit division 00    Status of Unit ISSUED    Transfusion Status OK TO TRANSFUSE    Crossmatch Result      Compatible Performed at [place] [address] [place], [place] [address]    Prepare RBC     Status: None   Collection Time: 09/08/18  5:23 PM  Result Value Ref Range   Order Confirmation      ORDER PROCESSED BY BLOOD BANK Performed at [facility], [place] [address], [place], [place] [address]   Direct antiglobulin test (not at [facility])     Status: None   Collection Time: 09/08/18  5:23 PM  Result Value Ref Range   DAT, complement NEG    DAT, IgG      NEG Performed at [facility], [place] [address] [place], [place] [address]   POC occult blood, ED RN will collect     Status: None   Collection Time: 09/08/18  5:50 PM  Result Value Ref Range   Fecal Occult Bld NEGATIVE NEGATIVE  ABO/Rh     Status: None   Collection Time: 09/08/18  5:55 PM  Result Value Ref Range   ABO/RH(D)      O POS Performed at [facility], [place] [address] [place], [place] [address]    Ct Angio Chest Pe W And/or Wo Contrast  Result Date: 09/08/2018 CLINICAL DATA:  Shortness of breath EXAM: CT ANGIOGRAPHY CHEST WITH CONTRAST TECHNIQUE: Multidetector CT imaging of the chest was performed using the standard protocol during bolus administration of intravenous contrast. Multiplanar CT image reconstructions and MIPs were obtained to evaluate the vascular anatomy. CONTRAST:  133mL OMNIPAQUE IOHEXOL 350 MG/ML SOLN COMPARISON:  Chest x-ray earlier today. FINDINGS: Cardiovascular: Cardiomegaly. Aortic atherosclerosis. No filling defects in the pulmonary arteries to suggest pulmonary emboli. Mediastinum/Nodes: No enlarged mediastinal, hilar, or axillary lymph nodes. Thyroid gland, trachea, and esophagus demonstrate no significant findings. Lungs/Pleura: Dependent bibasilar atelectasis. No confluent opacities otherwise. No effusions. Upper Abdomen: Imaging into the upper abdomen shows no acute findings. Musculoskeletal: Chest wall soft tissues are unremarkable. No acute bony abnormality. Review of the MIP images confirms the above findings. IMPRESSION:  No evidence of pulmonary embolus. Cardiomegaly. Dependent/bibasilar atelectasis. Aortic Atherosclerosis (ICD10-I70.0). Electronically Signed   By: Rolm Baptise M.D.   On: 09/08/2018 19:27   Dg Chest Port 1 View  Result Date: 09/08/2018 CLINICAL DATA:  Shortness of breath EXAM: PORTABLE CHEST 1 VIEW COMPARISON:  07/28/2016 FINDINGS: Cardiomegaly with small left pleural effusion, central vascular congestion and mild edema. Patchy atelectasis or minimal infiltrate at the left base. No pneumothorax. IMPRESSION: 1. Cardiomegaly with vascular congestion, mild interstitial edema and small left effusion 2. Patchy atelectasis or minimal infiltrate at the left base Electronically Signed   By: Donavan Foil M.D.   On: 09/08/2018 16:13    Pending Labs Unresulted Labs (From admission, onward)    Start     Ordered   09/09/18 0500  Protein electrophoresis, serum  Tomorrow morning,   R     09/08/18 2154   09/09/18 0500  CBC  Once,   R     09/09/18 0500   09/09/18 0500  Reticulocytes  Once,   R     09/09/18 0500   09/09/18 0400  Save Smear  Once,   R     09/08/18 2147   09/08/18 2154  Urinalysis, Routine  w reflex microscopic  Once,   R     09/08/18 2153   09/08/18 2031  Folate  (Anemia Panel (PNL))  ONCE - STAT,   STAT     09/08/18 2030   09/08/18 1547  Blood Culture (routine x 2)  BLOOD CULTURE X 2,   STAT     09/08/18 1547   Signed and Held  Magnesium  Tomorrow morning,   R    Comments:  Call MD if <1.5    Signed and Held   Signed and Held  Phosphorus  Tomorrow morning,   R     Signed and Held   Signed and Held  TSH  Once,   R    Comments:  Cancel if already done within 1 month and notify MD    Signed and Held   Signed and Held  Comprehensive metabolic panel  Once,   R    Comments:  Cal MD for K<3.5 or >5.0    Signed and Held   Signed and Held  CBC WITH DIFFERENTIAL  Tomorrow morning,   R     Signed and Held   Signed and Held  APTT  Tomorrow morning,   R     Signed and Held           Vitals/Pain Today's Vitals   09/08/18 2205 09/08/18 2230 09/08/18 2238 09/08/18 2303  BP: 137/88 (!) 154/102 (!) 154/102 (!) 147/95  Pulse: 88 (!) 45 94 96  Resp: 18 (!) 27 18 18   Temp: 98.7 F (37.1 C)  98.7 F (37.1 C) 98.8 F (37.1 C)  TempSrc: Oral  Oral Oral  SpO2:  93% 100%   PainSc:        Isolation Precautions No active isolations  Medications Medications  0.9 %  sodium chloride infusion (Manually program via Guardrails IV Fluids) (has no administration in time range)  sodium chloride (PF) 0.9 % injection (has no administration in time range)  cefTRIAXone (ROCEPHIN) 1 g in sodium chloride 0.9 % 100 mL IVPB (0 g Intravenous Stopped 09/08/18 1811)  azithromycin (ZITHROMAX) tablet 500 mg (500 mg Oral Given 09/08/18 1702)  iohexol (OMNIPAQUE) 350 MG/ML injection 100 mL (100 mLs Intravenous Contrast Given 09/08/18 1904)    Mobility walks Low fall risk   Focused Assessments Pulmonary Assessment Handoff:  Lung sounds:   O2 Device: Room Air O2 Flow Rate (L/min): 2 L/min      R Recommendations: See Admitting Provider Note  Report given to:   Additional Notes: none ED TO INPATIENT HANDOFF REPORT  ED Nurse Name and Phone #: Mortimer Fries [phone]  S Name/Age/Gender John Holder 85 y.o. male Room/Bed: WA05/WA05  Code Status   Code Status: Not on file  Home/SNF/Other Home Patient oriented to: self Is this baseline? Yes   Triage Complete: Triage complete  Chief Complaint low O2-dyspnea upon exertion  Triage Note Pt states that he has had continuing SOB x 1 month, worse on exertion. Hx of afib. Denies sick contacts. Alert and oriented. Some swelling noted in bilateral legs.    Allergies No Known Allergies  Level of Care/Admitting Diagnosis ED Disposition    ED Disposition Condition Comment   Admit  Hospital Area: [place] [[id number]]  Level of Care: Telemetry [5]  Admit to tele based on following criteria: Monitor for Ischemic  changes  Covid Evaluation: N/A  Diagnosis: Symptomatic anemia [[id number]]  Admitting Physician: Toy Baker [[id number]]  Attending Physician: Toy Baker [[id number]]  Estimated length of  stay: 3 - 4 days  Certification:: I certify this patient will need inpatient services for at least 2 midnights  PT Class (Do Not Modify): Inpatient [101]  PT Acc Code (Do Not Modify): Private [1]       B Medical/Surgery History Past Medical History:  Diagnosis Date  . Atrial fibrillation [facility])    Past Surgical History:  Procedure Laterality Date  . CARDIOVERSION N/A 02/04/2016   Procedure: CARDIOVERSION;  Surgeon: Thayer Headings, MD;  Location: [place];  Service: Cardiovascular;  Laterality: N/A;     A IV Location/Drains/Wounds Patient Lines/Drains/Airways Status   Active Line/Drains/Airways    Name:   Placement date:   Placement time:   Site:   Days:   Peripheral IV 09/08/18 Left Forearm   09/08/18    1630    Forearm   less than 1          Intake/Output Last 24 hours  Intake/Output Summary (Last 24 hours) at 09/08/2018 2323 Last data filed at 09/08/2018 2205 Gross per 24 hour  Intake 680 ml  Output -  Net 680 ml    Labs/Imaging Results for orders placed or performed during the hospital encounter of 09/08/18 (from the past 48 hour(s))  SARS Coronavirus 2 [facility] order, Performed in [place] hospital lab)     Status: None   Collection Time: 09/08/18  3:47 PM  Result Value Ref Range   SARS Coronavirus 2 NEGATIVE NEGATIVE    Comment: (NOTE) If result is NEGATIVE SARS-CoV-2 target nucleic acids are NOT DETECTED. The SARS-CoV-2 RNA is generally detectable in upper and lower  respiratory specimens during the acute phase of infection. The lowest  concentration of SARS-CoV-2 viral copies this assay can detect is 250  copies / mL. A negative result does not preclude SARS-CoV-2 infection  and should not be used as the sole basis for treatment or other  patient management  decisions.  A negative result may occur with  improper specimen collection / handling, submission of specimen other  than nasopharyngeal swab, presence of viral mutation(s) within the  areas targeted by this assay, and inadequate number of viral copies  (<250 copies / mL). A negative result must be combined with clinical  observations, patient history, and epidemiological information. If result is POSITIVE SARS-CoV-2 target nucleic acids are DETECTED. The SARS-CoV-2 RNA is generally detectable in upper and lower  respiratory specimens dur ing the acute phase of infection.  Positive  results are indicative of active infection with SARS-CoV-2.  Clinical  correlation with patient history and other diagnostic information is  necessary to determine patient infection status.  Positive results do  not rule out bacterial infection or co-infection with other viruses. If result is PRESUMPTIVE POSTIVE SARS-CoV-2 nucleic acids MAY BE PRESENT.   A presumptive positive result was obtained on the submitted specimen  and confirmed on repeat testing.  While 2019 novel coronavirus  (SARS-CoV-2) nucleic acids may be present in the submitted sample  additional confirmatory testing may be necessary for epidemiological  and / or clinical management purposes  to differentiate between  SARS-CoV-2 and other Sarbecovirus currently known to infect humans.  If clinically indicated additional testing with an alternate test  methodology [phone]) is advised. The SARS-CoV-2 RNA is generally  detectable in upper and lower respiratory sp ecimens during the acute  phase of infection. The expected result is Negative. Fact Sheet for Patients:  StrictlyIdeas.no Fact Sheet for Healthcare Providers: BankingDealers.co.za This test is not yet approved or cleared by  the [employer] and has been authorized for detection and/or diagnosis of SARS-CoV-2 by FDA under an  Emergency Use Authorization (EUA).  This EUA will remain in effect (meaning this test can be used) for the duration of the COVID-19 declaration under Section 564(b)(1) of the Act, 21 U.S.C. section 360bbb-3(b)(1), unless the authorization is terminated or revoked sooner. Performed at [facility], [place] [address] [place], [place] [address]   Lactic acid, plasma     Status: None   Collection Time: 09/08/18  3:47 PM  Result Value Ref Range   Lactic Acid, Venous 1.5 0.5 - 1.9 mmol/L    Comment: Performed at [facility], [place] [address] [place], [place] [address]  Blood Culture (routine x 2)     Status: None (Preliminary result)   Collection Time: 09/08/18  3:47 PM  Result Value Ref Range   Specimen Description      BLOOD LEFT FOREARM Performed at [facility], [place] [address] [place], [place] [address]    Special Requests      BOTTLES DRAWN AEROBIC AND ANAEROBIC Blood Culture adequate volume Performed at [place] [address], [place], [place] [address]    Culture PENDING    Report Status PENDING   Brain natriuretic peptide     Status: Abnormal   Collection Time: 09/08/18  3:56 PM  Result Value Ref Range   B Natriuretic Peptide 511.2 (H) 0.0 - 100.0 pg/mL    Comment: Performed at [facility], [place], [place] [address], [place], [place] [address]  CBC with Differential/Platelet     Status: Abnormal   Collection Time: 09/08/18  3:56 PM  Result Value Ref Range   WBC 2.6 (L) 4.0 - 10.5 K/uL   RBC 2.87 (L) 4.22 - 5.81 MIL/uL   Hemoglobin 6.0 (LL) 13.0 - 17.0 g/dL    Comment: REPEATED TO VERIFY Reticulocyte Hemoglobin testing may be clinically indicated, consider ordering this additional test [id number] THIS CRITICAL RESULT HAS VERIFIED AND BEEN CALLED TO C.HALL BY NATHAN THOMPSON ON 04 24 2020 AT 1716, AND HAS BEEN READ BACK. CRITICAL RESULT VERIFIED    HCT 22.0 (L) 39.0 - 52.0 %   MCV 76.7 (L) 80.0 -  100.0 fL   MCH 20.9 (L) 26.0 - 34.0 pg   MCHC 27.3 (L) 30.0 - 36.0 g/dL   RDW 21.7 (H) 11.5 - 15.5 %   Platelets 156 150 - 400 K/uL   nRBC 0.0 0.0 - 0.2 %   Neutrophils Relative % 47 %   Neutro Abs 1.2 (L) 1.7 - 7.7 K/uL   Lymphocytes Relative 27 %   Lymphs Abs 0.7 0.7 - 4.0 K/uL   Monocytes Relative 23 %   Monocytes Absolute 0.6 0.1 - 1.0 K/uL   Eosinophils Relative 2 %   Eosinophils Absolute 0.1 0.0 - 0.5 K/uL   Basophils Relative 1 %   Basophils Absolute 0.0 0.0 - 0.1 K/uL   Immature Granulocytes 0 %   Abs Immature Granulocytes 0.01 0.00 - 0.07 K/uL   Schistocytes PRESENT    Tear Drop Cells PRESENT    Polychromasia PRESENT    Ovalocytes PRESENT     Comment: Performed at [facility], [place] [address], [place], [place] [address]  Troponin I - ONCE - STAT     Status: None   Collection Time: 09/08/18  3:56 PM  Result Value Ref Range   Troponin I <0.03 <0.03 ng/mL  Comment: Performed at [facility], [place] [address], [place], [place] [address]  D-dimer, quantitative (not at [facility])     Status: Abnormal   Collection Time: 09/08/18  3:56 PM  Result Value Ref Range   D-Dimer, Quant 2.71 (H) 0.00 - 0.50 ug/mL-FEU    Comment: (NOTE) At the manufacturer cut-off of 0.50 ug/mL FEU, this assay has been documented to exclude PE with a sensitivity and negative predictive value of 97 to 99%.  At this time, this assay has not been approved by the FDA to exclude DVT/VTE. Results should be correlated with clinical presentation. Performed at [facility], [place] [address], [place], [place] [address]   Comprehensive metabolic panel     Status: Abnormal   Collection Time: 09/08/18  3:56 PM  Result Value Ref Range   Sodium 138 135 - 145 mmol/L   Potassium 4.2 3.5 - 5.1 mmol/L   Chloride 109 98 - 111 mmol/L   CO2 23 22 - 32 mmol/L   Glucose, Bld 106 (H) 70 - 99 mg/dL   BUN 32 (H) 8 - 23 mg/dL   Creatinine, Ser 1.31 (H) 0.61 - 1.24 mg/dL    Calcium 8.5 (L) 8.9 - 10.3 mg/dL   Total Protein 6.3 (L) 6.5 - 8.1 g/dL   Albumin 4.1 3.5 - 5.0 g/dL   AST 29 15 - 41 U/L   ALT 18 0 - 44 U/L   Alkaline Phosphatase 62 38 - 126 U/L   Total Bilirubin 1.1 0.3 - 1.2 mg/dL   GFR calc non Af Amer 50 (L) >60 mL/min   GFR calc Af Amer 58 (L) >60 mL/min   Anion gap 6 5 - 15    Comment: Performed at [facility], [place] [address], [place], [place] [address]  Procalcitonin     Status: None   Collection Time: 09/08/18  3:56 PM  Result Value Ref Range   Procalcitonin <0.10 ng/mL    Comment:        Interpretation: PCT (Procalcitonin) <= 0.5 ng/mL: Systemic infection (sepsis) is not likely. Local bacterial infection is possible. (NOTE)       Sepsis PCT Algorithm           Lower Respiratory Tract                                      Infection PCT Algorithm    ----------------------------     ----------------------------         PCT < 0.25 ng/mL                PCT < 0.10 ng/mL         Strongly encourage             Strongly discourage   discontinuation of antibiotics    initiation of antibiotics    ----------------------------     -----------------------------       PCT 0.25 - 0.50 ng/mL            PCT 0.10 - 0.25 ng/mL               OR       >80% decrease in PCT            Discourage initiation of  antibiotics      Encourage discontinuation           of antibiotics    ----------------------------     -----------------------------         PCT >= 0.50 ng/mL              PCT 0.26 - 0.50 ng/mL               AND        <80% decrease in PCT             Encourage initiation of                                             antibiotics       Encourage continuation           of antibiotics    ----------------------------     -----------------------------        PCT >= 0.50 ng/mL                  PCT > 0.50 ng/mL               AND         increase in PCT                  Strongly  encourage                                      initiation of antibiotics    Strongly encourage escalation           of antibiotics                                     -----------------------------                                           PCT <= 0.25 ng/mL                                                 OR                                        > 80% decrease in PCT                                     Discontinue / Do not initiate                                             antibiotics Performed at [place] [address], [place], [place] [address]   Lactate dehydrogenase     Status: None   Collection Time: 09/08/18  3:56 PM  Result Value Ref Range   LDH 165 98 - 192 U/L    Comment: Performed at [facility], [place] [address] [place], [place] [address]  Ferritin     Status: Abnormal   Collection Time: 09/08/18  3:56 PM  Result Value Ref Range   Ferritin 7 (L) 24 - 336 ng/mL    Comment: Performed at [facility], [place] [address] [place], [place] [address]  Triglycerides     Status: None   Collection Time: 09/08/18  3:56 PM  Result Value Ref Range   Triglycerides 31 <150 mg/dL    Comment: Performed at [facility], [place] [address] [place], [place] [address]  Fibrinogen     Status: None   Collection Time: 09/08/18  3:56 PM  Result Value Ref Range   Fibrinogen 262 210 - 475 mg/dL    Comment: Performed at [facility], [place] [address] [place], [place] [address]  C-reactive protein     Status: None   Collection Time: 09/08/18  3:56 PM  Result Value Ref Range   CRP <0.8 <1.0 mg/dL    Comment: Performed at [facility], [place] [address] [place], [place] [address]  Vitamin B12     Status: None   Collection Time: 09/08/18  3:56 PM  Result Value Ref Range   Vitamin B-12 325 180 - 914 pg/mL    Comment: (NOTE) This assay is not validated for testing neonatal  or myeloproliferative syndrome specimens for Vitamin B12 levels. Performed at [facility], [place] [address], [place], [place] [address]   Iron and TIBC     Status: Abnormal   Collection Time: 09/08/18  3:56 PM  Result Value Ref Range   Iron 7 (L) 45 - 182 ug/dL   TIBC 420 250 - 450 ug/dL   Saturation Ratios 2 (L) 17.9 - 39.5 %   UIBC 413 ug/dL    Comment: Performed at [facility], [place] [address], [place], [place] [address]  Type and screen     Status: None (Preliminary result)   Collection Time: 09/08/18  5:23 PM  Result Value Ref Range   ABO/RH(D) O POS    Antibody Screen NEG    Sample Expiration 09/11/2018    Unit Number [id number]    Blood Component Type RED CELLS,LR    Unit division 00    Status of Unit ISSUED    Transfusion Status OK TO TRANSFUSE    Crossmatch Result Compatible    Unit Number [id number]    Blood Component Type RED CELLS,LR    Unit division 00    Status of Unit ISSUED    Transfusion Status OK TO TRANSFUSE    Crossmatch Result      Compatible Performed at [facility], [place] [address] [place], [place] [address]   Prepare RBC     Status: None   Collection Time: 09/08/18  5:23 PM  Result Value Ref Range   Order Confirmation      ORDER PROCESSED BY BLOOD BANK Performed at [facility], [place] [address], [place], [place] [address]   Direct antiglobulin test (not at [facility])     Status: None   Collection Time: 09/08/18  5:23 PM  Result Value Ref Range   DAT, complement NEG    DAT, IgG      NEG Performed at [facility], [place] [address] [place], [place]  [address]   POC occult blood, ED RN will collect     Status: None   Collection Time: 09/08/18  5:50 PM  Result Value Ref Range   Fecal Occult Bld NEGATIVE NEGATIVE  ABO/Rh     Status: None   Collection Time: 09/08/18  5:55 PM  Result Value Ref Range   ABO/RH(D)      O POS Performed at [facility], [place] [address] [place], [place] [address]    Ct Angio Chest Pe W And/or Wo Contrast  Result Date: 09/08/2018 CLINICAL DATA:  Shortness of breath EXAM: CT ANGIOGRAPHY CHEST WITH CONTRAST TECHNIQUE: Multidetector CT imaging of the chest was performed using the standard protocol during bolus administration of intravenous contrast. Multiplanar CT image reconstructions and MIPs were obtained to evaluate the vascular anatomy. CONTRAST:  180mL OMNIPAQUE IOHEXOL 350 MG/ML SOLN COMPARISON:  Chest x-ray earlier today. FINDINGS: Cardiovascular: Cardiomegaly. Aortic atherosclerosis. No filling defects in the pulmonary arteries to suggest pulmonary emboli. Mediastinum/Nodes: No enlarged mediastinal, hilar, or axillary lymph nodes. Thyroid gland, trachea, and esophagus demonstrate no significant findings. Lungs/Pleura: Dependent bibasilar atelectasis. No confluent opacities otherwise. No effusions. Upper Abdomen: Imaging into the upper abdomen shows no acute findings. Musculoskeletal: Chest wall soft tissues are unremarkable. No acute bony abnormality. Review of the MIP images confirms the above findings. IMPRESSION: No evidence of pulmonary embolus. Cardiomegaly. Dependent/bibasilar atelectasis. Aortic Atherosclerosis (ICD10-I70.0). Electronically Signed   By: Rolm Baptise M.D.   On: 09/08/2018 19:27   Dg Chest Port 1 View  Result Date: 09/08/2018 CLINICAL DATA:  Shortness of breath EXAM: PORTABLE CHEST 1 VIEW COMPARISON:  07/28/2016 FINDINGS: Cardiomegaly with small left pleural effusion, central vascular congestion and mild edema. Patchy atelectasis or minimal infiltrate at the left base. No pneumothorax. IMPRESSION: 1. Cardiomegaly with vascular congestion, mild interstitial edema and small left effusion 2. Patchy atelectasis or minimal infiltrate at the left base Electronically Signed   By: Donavan Foil M.D.   On: 09/08/2018 16:13    Pending Labs Unresulted Labs (From admission, onward)     Start     Ordered   09/09/18 0500  Protein electrophoresis, serum  Tomorrow morning,   R     09/08/18 2154   09/09/18 0500  CBC  Once,   R     09/09/18 0500   09/09/18 0500  Reticulocytes  Once,   R     09/09/18 0500   09/09/18 0400  Save Smear  Once,   R     09/08/18 2147   09/08/18 2154  Urinalysis, Routine w reflex microscopic  Once,   R     09/08/18 2153   09/08/18 2031  Folate  (Anemia Panel (PNL))  ONCE - STAT,   STAT     09/08/18 2030   09/08/18 1547  Blood Culture (routine x 2)  BLOOD CULTURE X 2,   STAT     09/08/18 1547   Signed and Held  Magnesium  Tomorrow morning,   R    Comments:  Call MD if <1.5    Signed and Held   Signed and Held  Phosphorus  Tomorrow morning,   R     Signed and Held   Signed and Held  TSH  Once,   R    Comments:  Cancel if already done within 1 month and notify MD    Signed and Held   Signed and Held  Comprehensive metabolic panel  Once,   R    Comments:  Cal MD for K<3.5 or >5.0    Signed and Held   Signed and Held  CBC WITH DIFFERENTIAL  Tomorrow morning,   R     Signed and Held   Signed and Held  APTT  Tomorrow morning,   R     Signed and Held          Vitals/Pain Today's Vitals   09/08/18 2205 09/08/18 2230 09/08/18 2238 09/08/18 2303  BP: 137/88 (!) 154/102 (!) 154/102 (!) 147/95  Pulse: 88 (!) 45 94 96  Resp: 18 (!) 27 18 18   Temp: 98.7 F (37.1 C)  98.7 F (37.1 C) 98.8 F (37.1 C)  TempSrc: Oral  Oral Oral  SpO2:  93% 100%   PainSc:        Isolation Precautions No active isolations  Medications Medications  0.9 %  sodium chloride infusion (Manually program via Guardrails IV Fluids) (has no administration in time range)  sodium chloride (PF) 0.9 % injection (has no administration in time range)  cefTRIAXone (ROCEPHIN) 1 g in sodium chloride 0.9 % 100 mL IVPB (0 g Intravenous Stopped 09/08/18 1811)  azithromycin (ZITHROMAX) tablet 500 mg (500 mg Oral Given 09/08/18 1702)  iohexol (OMNIPAQUE) 350 MG/ML injection 100  mL (100 mLs Intravenous Contrast Given 09/08/18 1904)    Mobility walks Low fall risk   Focused Assessments Pulmonary Assessment Handoff:  Lung sounds:   O2 Device: Room Air O2 Flow Rate (L/min): 2 L/min      R Recommendations: See Admitting Provider Note  Report given to:   Additional Notes: pt stable

## 2018-09-08 NOTE — Telephone Encounter (Signed)
Patient called at home. See separate phone note.

## 2018-09-08 NOTE — Telephone Encounter (Signed)
Patient sent MyChart message due to low O2 levels. I called him at home. He notes O2 sats in the 60s with ambulation.  He has recorded O2 sats in the 90s at rest. He feels short of breath with any activity.  He has not had shortness of breath at rest. He has not had orthopnea, paroxysmal nocturnal dyspnea, edema, chest pain. He has not had fever, cough. He notes fatigue. He has not missed any doses of Eliquis.  He has not had syncope.  I reviewed his symptoms with Dr. Acie Fredrickson. We both feel he needs more urgent evaluation. We were trying to get him set up for an echo and labs on Monday of next week. However, with O2 sats dropping into the 60s, we feel evaluation in the ED is better. He does not have all the symptoms of COVID-19, but his low O2 sats are concerning.  He should probably have this ruled out if there are no other clear causes. CHF is unlikely to cause low O2 sats like this unless he had florid pulmonary edema.  He does not seem to have symptoms that would suggest this.  PLAN:  1. Patient has been advised to go to the ED at [facility].  He is hesitant but agrees. 2. I have notified Triage at [facility] of his symptoms.  Richardson Dopp, PA-C    09/08/2018 2:33 PM

## 2018-09-08 NOTE — H&P (Signed)
John Holder KJZ:[id number] DOB: 02-25-1932 DOA: 09/08/2018     PCP: Burnard Bunting, MD   Outpatient Specialists:   CARDS:   Dr. Acie Fredrickson   GI   [facility] ) Urology Dr. Amalia Hailey, Laban Emperor, MD at [facility] now retired Dr. Jeffie Pollock  Patient arrived to ER on 09/08/18 at 1507  Patient coming from: home Lives alone,        Chief Complaint:  Chief Complaint  Patient presents with  . Shortness of Breath    HPI: John Holder is a 85 y.o. male with medical history significant of hypertension and atrial fibrillation hyperlipidemia, history of nephrectomy for renal mass in the past.    Presented with 1 month hx  of worsening shortness of breath he could no longer play his tennis because he got more short of breath than his baseline.  And his teammates noticed that he looks more pale.  No chest pain no arm pain no jaw pain no leg swelling no passing out. He had a chest x-ray done which was read normal. His primary care provider started him on Lasix 40 mg a day just in case. He is energy has been decreasing Had tele-visit with cardiologist on 16 April and his metoprolol was increased to 57m in the morning and 50 at night.  After that was found on cardiac monitor that he has a occasional uncontrollable heart rates. Patient at baseline used to plays tennis competitively but no longer able to secondary to shortness of breath  He denies any fevers or chills mild cough for the past 2-3 wks. Reports weight loss Reports a bit on anlke swelling wears compression hoses   Cardiology have seen him on telemetry visit on 22nd for of April and arranged for echogram repeat lab work and increased his Toprol to 50 mg twice a day  Last hemoglobin was noted to be 13.8 in December 2019  Patient called in in the office stating that his oxygen saturation went down to 60s when he tried to ambulate and was back to 90s at rest he was told to present to emergency department  He got his labs done showed hemoglobin  of 6.0 which is down from his baseline of 13.8 He actually stopped his Lasix.  Denies any flulike illness no bloody bowel movements or abnormal bleeding no prior history of blood clots or heart failure. Denies melena no blood in stools  Last colonoscopy has been  Along time ago  Annual hemoccult has been negative NO pica He has been eating very healthy He has not been eating a lot of red meat   Infectious risk factors: Reports dry cough, fatigue [place] Den[place] fever, chest pain, Sore throat,URI symptoms, anosmia/change in taste, N/V/Diarrhea/abdominal pain,  Body aches, severe  sick contacts, known COVID 19 exposure, Travel to high risk area, Exposure to travelers from high risk area       Regarding pertinent Chronic problems:   History of atrial fibrillation for which he is on Eliquis and metoprolol XL History of hypertension for which she takes losartan Last echogram in 2017 showed preserved EF mild to moderate MR Last hemoglobin A1c was 5.1 in February 2020  While in ER: On arrival to ER noted to be hypoxic down to 84% on room air BNP slightly elevated 511 Noted to have hemoglobin down to 6.0 BUN 32 with creatinine at baseline 1.3 With ferritin only 7  Also was found to have left lower lobe  infiltrate and was started on Rocephin and Zithromax for pneumonia CBC worrisome for pancytopenia COVID19 NEGATIVE   The following Work up has been ordered so far:  Orders Placed This Encounter  Procedures  . SARS Coronavirus 2 [facility] order, Performed in [facility] hospital lab)  . Blood Culture (routine x 2)  . DG Chest Port 1 View  . CT Angio Chest PE W and/or Wo Contrast  . Brain natriuretic peptide  . CBC with Differential/Platelet  . Troponin I - ONCE - STAT  . D-dimer, quantitative (not at [facility])  . Lactic acid, plasma  . Comprehensive metabolic panel  . Procalcitonin  . Lactate dehydrogenase  . Ferritin  . Triglycerides  . Fibrinogen  .  C-reactive protein  . CBC  . Diet NPO time specified  . Cardiac monitoring  . Initiate Carrier Fluid Protocol  . Place surgical mask on patient  . Patient to wear surgical mask during transportation  . Assess patient for ability to self-prone. If able (can move self in bed, ambulate) and stable (SpO2 and oxygen requirement):  [name] Notify EDP if new oxygen requirements escalates > 4L per minute [place]  . RN to draw the following extra tubes:  . Place on droplet/contact if:  [name] Place on airborne/contact if:  . RN/NT - Document specific oxygen requirements in CHL  . Practitioner attestation of consent  . Complete patient signature process for consent form  . Consult to hospitalist  . Pulse oximetry (single)  . POC occult blood, ED RN will collect  . ED EKG  . EKG 12-Lead  . Type and screen  . Prepare RBC  . ABO/Rh  . Insert peripheral IV     Following Medications were ordered in ER: Medications  0.9 %  sodium chloride infusion (Manually program via Guardrails IV Fluids) (has no administration in time range)  sodium chloride (PF) 0.9 % injection (has no administration in time range)  cefTRIAXone (ROCEPHIN) 1 g in sodium chloride 0.9 % 100 mL IVPB (0 g Intravenous Stopped 09/08/18 1811)  azithromycin (ZITHROMAX) tablet 500 mg (500 mg Oral Given 09/08/18 1702)  iohexol (OMNIPAQUE) 350 MG/ML injection 100 mL (100 mLs Intravenous Contrast Given 09/08/18 1904)        Consult Orders  (From admission, onward)         Start     Ordered   09/08/18 1948  Consult to hospitalist  Once    Provider:  Toy Baker, MD  Question Answer Comment  Place call to: [facility]   Reason for Consult Admit      09/08/18 1947            Significant initial  Findings: Abnormal Labs Reviewed  BRAIN NATRIURETIC PEPTIDE - Abnormal; Notable for the following components:      Result Value   B Natriuretic Peptide 511.2 (*)    All other components within normal limits  CBC WITH  DIFFERENTIAL/PLATELET - Abnormal; Notable for the following components:   WBC 2.6 (*)    RBC 2.87 (*)    Hemoglobin 6.0 (*)    HCT 22.0 (*)    MCV 76.7 (*)    MCH 20.9 (*)    MCHC 27.3 (*)    RDW 21.7 (*)    Neutro Abs 1.2 (*)    All other components within normal limits  D-DIMER, QUANTITATIVE (NOT AT [facility]) - Abnormal; Notable for the following components:   D-Dimer, Quant 2.71 (*)    All other components within normal limits  COMPREHENSIVE METABOLIC PANEL - Abnormal; Notable for the following components:   Glucose, Bld 106 (*)    BUN 32 (*)    Creatinine, Ser 1.31 (*)    Calcium 8.5 (*)    Total Protein 6.3 (*)    GFR calc non Af Amer 50 (*)    GFR calc Af Amer 58 (*)    All other components within normal limits  FERRITIN - Abnormal; Notable for the following components:   Ferritin 7 (*)    All other components within normal limits      Otherwise labs showing:    Recent Labs  Lab 09/08/18 1556  NA 138  K 4.2  CO2 23  GLUCOSE 106*  BUN 32*  CREATININE 1.31*  CALCIUM 8.5*    Cr   Up from baseline see below Lab Results  Component Value Date   CREATININE 1.31 (H) 09/08/2018   CREATININE 1.25 05/26/2017   CREATININE 1.20 04/27/2017    Recent Labs  Lab 09/08/18 1556  AST 29  ALT 18  ALKPHOS 62  BILITOT 1.1  PROT 6.3*  ALBUMIN 4.1     WBC       Component Value Date/Time   WBC 2.6 (L) 09/08/2018 1556    Lactic Acid, Venous    Component Value Date/Time   LATICACIDVEN 1.5 09/08/2018 1547    Procalcitonin <0.1 No components found for: PRO-CALCITONIN    Component Value Date/Time   LDH 165 09/08/2018 1556     HG/HCT  Down  from baseline see below    Component Value Date/Time   HGB 6.0 (LL) 09/08/2018 1556   HGB 15.1 05/26/2017 1042   HCT 22.0 (L) 09/08/2018 1556   HCT 43.7 05/26/2017 1042    Troponin <0.03   BNP (last 3 results) Recent Labs    09/08/18 1556  BNP 511.2*      UA ordered   Urine analysis: No results found for:  COLORURINE, APPEARANCEUR, LABSPEC, PHURINE, GLUCOSEU, HGBUR, BILIRUBINUR, KETONESUR, PROTEINUR, UROBILINOGEN, NITRITE, LEUKOCYTESUR      CXR -left lower lobe infiltrate  CTA chest -  nonacute, no PE,  no evidence of infiltrate   ECG:  Personally reviewed by me showing: HR : 85  Rhythm:    A.fib.  , RBBB,   nonspecific changes,   QTC 462     ED Triage Vitals [09/08/18 1523]  Enc Vitals Group     BP 132/79     Pulse Rate 85     Resp 18     Temp 98.1 F (36.7 C)     Temp Source Oral     SpO2 (!) 84 %     Weight      Height      Head Circumference      Peak Flow      Pain Score      Pain Loc      Pain Edu?      Excl. in [place]?   ZRAQ(76)@       Latest  Blood pressure 136/84, pulse 82, temperature 97.9 F (36.6 C), temperature source Oral, resp. rate (!) 21, SpO2 100 %.    Hospitalist was called for admission for symptomatic anemia   Review of Systems:    Pertinent positives include: shortness of breath at rest.  dyspnea on exertion, fatigue, weight loss non-productive cough   Constitutional:  No weight loss,, Fevers, chills, HEENT:  No headaches, Difficulty swallowing,Tooth/dental problems,Sore throat,  No sneezing, itching, ear ache, nasal congestion, post  nasal drip,  Cardio-vascular:  No chest pain, Orthopnea, PND, anasarca, dizziness, palpitations.no Bilateral lower extremity swelling  GI:  No heartburn, indigestion, abdominal pain, nausea, vomiting, diarrhea, change in bowel habits, loss of appetite, melena, blood in stool, hematemesis Resp:    No excess mucus, no productive cough, No , No coughing up of blood.No change in color of mucus.No wheezing. Skin:  no rash or lesions. No jaundice GU:  no dysuria, change in color of urine, no urgency or frequency. No straining to urinate.  No flank pain.  Musculoskeletal:  No joint pain or no joint swelling. No decreased range of motion. No back pain.  Psych:  No change in mood or affect. No depression or anxiety.  No memory loss.  Neuro: no localizing neurological complaints, no tingling, no weakness, no double vision, no gait abnormality, no slurred speech, no confusion  All systems reviewed and apart from [place] all are negative  Past Medical History:   Past Medical History:  Diagnosis Date  . Atrial fibrillation [facility])       Past Surgical History:  Procedure Laterality Date  . CARDIOVERSION N/A 02/04/2016   Procedure: CARDIOVERSION;  Surgeon: Thayer Headings, MD;  Location: [facility] ENDOSCOPY;  Service: Cardiovascular;  Laterality: N/A;    Social History:  Ambulatory   independently      reports that he has never smoked. He has never used smokeless tobacco. He reports current alcohol use. He reports that he does not use drugs.    Former pipe smoker, quit at 85 yo  Family History:   Family History  Problem Relation Age of Onset  . Breast cancer Mother   . Hypertension Father   . Diabetes Neg Hx   . Stroke Neg Hx   . CAD Neg Hx     Allergies: No Known Allergies   Prior to Admission medications   Medication Sig Start Date End Date Taking? Authorizing Provider  ALPRAZolam (XANAX) 0.25 MG tablet Take 0.25 mg by mouth every 6 (six) hours as needed for anxiety.  07/22/14   [provider]  Barberry-Oreg Grape-Goldenseal (BERBERINE COMPLEX PO) Take 600 mg by mouth 2 (two) times a day.    [provider]  ELIQUIS 5 MG TABS tablet Take 5 mg by mouth 2 (two) times daily.  12/29/15   [provider]  glucosamine-chondroitin 500-400 MG tablet Take 1 tablet by mouth 2 (two) times daily.     [provider]  losartan (COZAAR) 50 MG tablet Take 1 tablet (50 mg total) by mouth daily. 08/11/18   Nahser, Wonda Cheng, MD  metoprolol succinate (TOPROL-XL) 50 MG 24 hr tablet Take 1 tablet (50 mg total) by mouth 2 (two) times a day. Take with or immediately following a meal. 09/06/18 09/06/19  Richardson Dopp T, PA-C  Multiple Vitamin (MULTIVITAMIN) capsule Take 1 capsule by mouth  daily.    [provider]  omega-3 acid ethyl esters (LOVAZA) 1 G capsule Take 1 g by mouth 2 (two) times daily.    [provider]  omeprazole (PRILOSEC) 20 MG capsule Take 20 mg by mouth daily. 07/27/14   [provider]  saw palmetto 160 MG capsule Take 160 mg by mouth 2 (two) times daily.    [provider]   Physical Exam: Blood pressure 136/84, pulse 82, temperature 97.9 F (36.6 C), temperature source Oral, resp. rate (!) 21, SpO2 100 %. 1. General:  in No  Acute distress    Chronically ill -appearing 2. Psychological:  Alert and  Oriented 3. Head/ENT:   Moist   Mucous Membranes                          Head Non traumatic, neck supple                           Poor Dentition 4. SKIN:  decreased Skin turgor,  Skin clean Dry and intact no rash 5. Heart: Regular rate and rhythm no  Murmur, no Rub or gallop 6. Lungs:  Clear to auscultation bilaterally, no wheezes or crackles   7. Abdomen: Soft,  non-tender, Non distended  bowel sounds present 8. Lower extremities: no clubbing, cyanosis, no  edema 9. Neurologically Grossly intact, moving all 4 extremities equally  10. MSK: Normal range of motion   All other LABS:     Recent Labs  Lab 09/08/18 1556  WBC 2.6*  NEUTROABS 1.2*  HGB 6.0*  HCT 22.0*  MCV 76.7*  PLT 156     Recent Labs  Lab 09/08/18 1556  NA 138  K 4.2  CL 109  CO2 23  GLUCOSE 106*  BUN 32*  CREATININE 1.31*  CALCIUM 8.5*     Recent Labs  Lab 09/08/18 1556  AST 29  ALT 18  ALKPHOS 62  BILITOT 1.1  PROT 6.3*  ALBUMIN 4.1       Cultures:    Component Value Date/Time   SDES  09/08/2018 1547    BLOOD LEFT FOREARM Performed at [facility], [place] [address] [place], [place] [address]    [place]  09/08/2018 1547    BOTTLES DRAWN AEROBIC AND ANAEROBIC Blood Culture adequate volume Performed at [facility] [address] [place], [place] [address]    CULT PENDING 09/08/2018  1547   REPTSTATUS PENDING 09/08/2018 1547     Radiological Exams on Admission: Ct Angio Chest Pe W And/or Wo Contrast  Result Date: 09/08/2018 CLINICAL DATA:  Shortness of breath EXAM: CT ANGIOGRAPHY CHEST WITH CONTRAST TECHNIQUE: Multidetector CT imaging of the chest was performed using the standard protocol during bolus administration of intravenous contrast. Multiplanar CT image reconstructions and MIPs were obtained to evaluate the vascular anatomy. CONTRAST:  168m OMNIPAQUE IOHEXOL 350 MG/ML SOLN COMPARISON:  Chest x-ray earlier today. FINDINGS: Cardiovascular: Cardiomegaly. Aortic atherosclerosis. No filling defects in the pulmonary arteries to suggest pulmonary emboli. Mediastinum/Nodes: No enlarged mediastinal, hilar, or axillary lymph nodes. Thyroid gland, trachea, and esophagus demonstrate no significant findings. Lungs/Pleura: Dependent bibasilar atelectasis. No confluent opacities otherwise. No effusions. Upper Abdomen: Imaging into the upper abdomen shows no acute findings. Musculoskeletal: Chest wall soft tissues are unremarkable. No acute bony abnormality. Review of the MIP images confirms the above findings. IMPRESSION: No evidence of pulmonary embolus. Cardiomegaly. Dependent/bibasilar atelectasis. Aortic Atherosclerosis (ICD10-I70.0). Electronically Signed   By: KRolm BaptiseM.D.   On: 09/08/2018 19:27   Dg Chest Port 1 View  Result Date: 09/08/2018 CLINICAL DATA:  Shortness of breath EXAM: PORTABLE CHEST 1 VIEW COMPARISON:  07/28/2016 FINDINGS: Cardiomegaly with small left pleural effusion, central vascular congestion and mild edema. Patchy atelectasis or minimal infiltrate at the left base. No pneumothorax. IMPRESSION: 1. Cardiomegaly with vascular congestion, mild interstitial edema and small left effusion 2. Patchy atelectasis or minimal infiltrate at the left base Electronically Signed   By: KDonavan FoilM.D.   On: 09/08/2018 16:13    Chart has been reviewed  Assessment/Plan   85 y.o. male with medical history significant of hypertension and atrial fibrillation hyperlipidemia, history of nephrectomy for renal mass in the past. Admitted for acute hypoxia and symptomatic anemia  Present on Admission:  . Pancytopenia ([place]) cussed with hematology oncology will see patient in the morning obtain blood smear for review may need bone marrow biopsy. Suspect myelodysplastic disorder At this point.  Oncology no indication for transfer to [facility] test, anemia panel, blood smear added, ER Staff notified lab on the phone and order placed. Per oncology TTP unlikely . Hypoxia with a history of significant anemia CTA negative for PE provide oxygen as needed COVID-19 negative no infiltrates in the chest noted . Symptomatic anemia- patient was written for 2 units of blood in emergency department will continue and repeat CBC once transfused  Chronic a.fib -          - CHA2DS2 vas score 3: hold Xarelto as he will likely  need bone marrow aspiration done        -  Rate control:  Currently controlled with   Toprolol,        Dyspnea -likely due to severe anemia.  Not improved with Lasix patient have outpatient echogram scheduled Other plan as per orders.  DVT prophylaxis:  SCD      Code Status:  FULL CODE    as per patient   I had personally discussed CODE STATUS with patient    Family Communication:   Family not at  Bedside Called son Richardson Landry spoke to him on the phone with permission of the patient  Disposition Plan:       To home once workup is complete and patient is stable                                    Consults called: Oncology    Admission status:  ED Disposition    None         inpatient     Expect 2 midnight stay secondary to severity of patient's current illness including   hemodynamic instability despite optimal treatment (tachycardia  hypoxia,  )   Severe lab/radiological/exam abnormalities including: pancytopenia    and  extensive comorbidities including:       malignancy, .   . Chronic anticoagulation  That are currently affecting medical management.   I expect  patient to be hospitalized for 2 midnights requiring inpatient medical care.  Patient is at high risk for adverse outcome (such as loss of life or disability) if not treated.  Indication for inpatient stay as follows:   Hemodynamic instability despite maximal medical therapy,    Need for operative/procedural  intervention New or worsening hypoxia  Need for IV antibiotics, IV fluids      Level of care     tele  For  24H     Precautions: NONE   No active isolations  PPE: Used by the provider:   P100  eye Goggles,  Gloves      Pearlene Teat 09/08/2018, 9:01 PM    [facility]     after 2 AM please page floor coverage PA If 7AM-7PM, please contact the day team taking care of the patient using [email or web address]

## 2018-09-08 NOTE — ED Notes (Signed)
ED Provider at bedside. 

## 2018-09-08 NOTE — ED Notes (Signed)
Per Cardiology PA, states history of AFIB-states tele visit done-states increased SOB, elevated HR, dyspnea upon exertion-sending to ED for eval

## 2018-09-09 ENCOUNTER — Other Ambulatory Visit: Payer: Self-pay

## 2018-09-09 DIAGNOSIS — I482 Chronic atrial fibrillation, unspecified: Secondary | ICD-10-CM | POA: Diagnosis present

## 2018-09-09 DIAGNOSIS — I10 Essential (primary) hypertension: Secondary | ICD-10-CM | POA: Diagnosis present

## 2018-09-09 DIAGNOSIS — D509 Iron deficiency anemia, unspecified: Secondary | ICD-10-CM | POA: Diagnosis present

## 2018-09-09 DIAGNOSIS — D508 Other iron deficiency anemias: Secondary | ICD-10-CM

## 2018-09-09 LAB — CBC WITH DIFFERENTIAL/PLATELET
Abs Immature Granulocytes: 0.01 10*3/uL (ref 0.00–0.07)
Basophils Absolute: 0 10*3/uL (ref 0.0–0.1)
Basophils Relative: 1 %
Eosinophils Absolute: 0.1 10*3/uL (ref 0.0–0.5)
Eosinophils Relative: 3 %
HCT: 25.2 % — ABNORMAL LOW (ref 39.0–52.0)
Hemoglobin: 7.4 g/dL — ABNORMAL LOW (ref 13.0–17.0)
Immature Granulocytes: 0 %
Lymphocytes Relative: 27 %
Lymphs Abs: 0.8 10*3/uL (ref 0.7–4.0)
MCH: 23 pg — ABNORMAL LOW (ref 26.0–34.0)
MCHC: 29.4 g/dL — ABNORMAL LOW (ref 30.0–36.0)
MCV: 78.3 fL — ABNORMAL LOW (ref 80.0–100.0)
Monocytes Absolute: 0.6 10*3/uL (ref 0.1–1.0)
Monocytes Relative: 21 %
Neutro Abs: 1.4 10*3/uL — ABNORMAL LOW (ref 1.7–7.7)
Neutrophils Relative %: 48 %
Platelets: 138 10*3/uL — ABNORMAL LOW (ref 150–400)
RBC: 3.22 MIL/uL — ABNORMAL LOW (ref 4.22–5.81)
RDW: 20.7 % — ABNORMAL HIGH (ref 11.5–15.5)
WBC: 2.8 10*3/uL — ABNORMAL LOW (ref 4.0–10.5)
nRBC: 0 % (ref 0.0–0.2)

## 2018-09-09 LAB — BPAM RBC
Blood Product Expiration Date: 202005012359
Blood Product Expiration Date: 202005012359
ISSUE DATE / TIME: 202004241941
ISSUE DATE / TIME: 202004242212
Unit Type and Rh: 5100
Unit Type and Rh: 5100

## 2018-09-09 LAB — TYPE AND SCREEN
ABO/RH(D): O POS
Antibody Screen: NEGATIVE
Unit division: 0
Unit division: 0

## 2018-09-09 LAB — COMPREHENSIVE METABOLIC PANEL
ALT: 16 U/L (ref 0–44)
AST: 24 U/L (ref 15–41)
Albumin: 3.7 g/dL (ref 3.5–5.0)
Alkaline Phosphatase: 57 U/L (ref 38–126)
Anion gap: 6 (ref 5–15)
BUN: 30 mg/dL — ABNORMAL HIGH (ref 8–23)
CO2: 23 mmol/L (ref 22–32)
Calcium: 8.5 mg/dL — ABNORMAL LOW (ref 8.9–10.3)
Chloride: 109 mmol/L (ref 98–111)
Creatinine, Ser: 1.12 mg/dL (ref 0.61–1.24)
GFR calc Af Amer: >60 mL/min (ref >60)
GFR calc non Af Amer: 60 mL/min — ABNORMAL LOW (ref >60)
Glucose, Bld: 112 mg/dL — ABNORMAL HIGH (ref 70–99)
Potassium: 4.1 mmol/L (ref 3.5–5.1)
Sodium: 138 mmol/L (ref 135–145)
Total Bilirubin: 1.4 mg/dL — ABNORMAL HIGH (ref 0.3–1.2)
Total Protein: 5.7 g/dL — ABNORMAL LOW (ref 6.5–8.1)

## 2018-09-09 LAB — TSH: TSH: 2.531 u[IU]/mL (ref 0.350–4.500)

## 2018-09-09 LAB — FOLATE: Folate: 29.8 ng/mL (ref >5.9)

## 2018-09-09 LAB — PHOSPHORUS: Phosphorus: 4 mg/dL (ref 2.5–4.6)

## 2018-09-09 LAB — MAGNESIUM: Magnesium: 2.4 mg/dL (ref 1.7–2.4)

## 2018-09-09 LAB — RETICULOCYTES
Immature Retic Fract: 5.3 % (ref 2.3–15.9)
RBC.: 3.22 MIL/uL — ABNORMAL LOW (ref 4.22–5.81)
Retic Count, Absolute: 40.5 10*3/uL (ref 19.0–186.0)
Retic Ct Pct: 1.2 % (ref 0.4–3.1)

## 2018-09-09 LAB — URINALYSIS, ROUTINE W REFLEX MICROSCOPIC
Bilirubin Urine: NEGATIVE
Glucose, UA: NEGATIVE mg/dL
Hgb urine dipstick: NEGATIVE
Ketones, ur: NEGATIVE mg/dL
Leukocytes,Ua: NEGATIVE
Nitrite: NEGATIVE
Protein, ur: NEGATIVE mg/dL
Specific Gravity, Urine: 1.036 — ABNORMAL HIGH (ref 1.005–1.030)
pH: 5 (ref 5.0–8.0)

## 2018-09-09 LAB — SAVE SMEAR(SSMR), FOR PROVIDER SLIDE REVIEW

## 2018-09-09 LAB — APTT: aPTT: 35 seconds (ref 24–36)

## 2018-09-09 MED ORDER — SODIUM CHLORIDE 0.9 % IV SOLN
510.0000 mg | Freq: Once | INTRAVENOUS | Status: AC
  Administered 2018-09-09: 510 mg via INTRAVENOUS
  Filled 2018-09-09: qty 17

## 2018-09-09 MED ORDER — PEG 3350-KCL-NA BICARB-NACL 420 G PO SOLR
4000.0000 mL | Freq: Once | ORAL | Status: AC
  Administered 2018-09-09: 4000 mL via ORAL

## 2018-09-09 NOTE — Consult Note (Signed)
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 He does have cardiac issues and brought this to the attention of his cardiologist Liam Rogers and his PCP Burnard Bunting. The patient was started on lasix empirically, w/o improvement.  Yesterday he presented to the ED with SOB symptoms and was found to have a Hb of 6.o with an MCV of 76.7. WBC was 2.6, platelets 156K. Note on 05/26/2018 Hb was 15.1 with a normal MCV.  Admission labs showed retics 40.5, normal B-12 and folate, normal CRP, normal LDH and negative DAT. Ferritin however was 7 and iron saturation 2%  He was transfused and admitted for further evaluation . INTERVAL HISTORY: I met with John Holder in his hospital room this AM.  REVIEW OF SYSTEMS: He is not SOB at restand is not aware of any worsening of his heart rhythm issues. He reclls some "blackish" BMs about a month ago, but not more recently. Denies change in bowel habits otherwise. No epistaxis, hemoptysis, bruising or other overt bleeding. Weight loss 6 lgs over past several months. A detailed ROS was otherwise unremarkable  PAST MEDICAL HISTORY: Past Medical History:  Diagnosis Date  . Atrial fibrillation ([place])     PAST SURGICAL HISTORY: Past Surgical History:  Procedure Laterality Date  . CARDIOVERSION N/A 02/04/2016   Procedure: CARDIOVERSION;  Surgeon: Thayer Headings, MD;  Location: [facility] ENDOSCOPY;   Service: Cardiovascular;  Laterality: N/A;    FAMILY HISTORY Family History  Problem Relation Age of Onset  . Breast cancer Mother   . Hypertension Father   . Diabetes Neg Hx   . Stroke Neg Hx   . CAD Neg Hx    SOCIAL HISTORY:  Retired judge, currently sharing a home with his significant other, John Holder. Recently moved, with "lots of exercise carrying boxes."    ADVANCED DIRECTIVES: not in place; plans to name John Holder as his HCPOA with his son John Holder as secondary; Remo Lipps lives in [place] and works as a Geophysicist/field seismologist   HEALTH MAINTENANCE: Social History   Tobacco Use  . Smoking status: Never Smoker  . Smokeless tobacco: Never Used  Substance Use Topics  . Alcohol use: Yes    Alcohol/week: 0.0 standard drinks    Comment: occasional  . Drug use: No     Colonoscopy: [facility], "about 2013;" does not recall MD's name  Bone density:   No Known Allergies  Current Facility-Administered Medications  Medication Dose Route Frequency Provider Last Rate Last Dose  . 0.9 %  sodium chloride infusion (Manually program via Guardrails IV Fluids)   Intravenous Once Toy Baker, MD      . acetaminophen (TYLENOL) tablet 650 mg  650 mg Oral Q6H PRN Doutova, Anastassia, MD       Or  . acetaminophen (TYLENOL) suppository 650 mg  650 mg Rectal Q6H PRN Doutova, Anastassia, MD      . ALPRAZolam Duanne Moron) tablet 0.25 mg  0.25  mg Oral Q6H PRN Toy Baker, MD      . HYDROcodone-acetaminophen (NORCO/VICODIN) 5-325 MG per tablet 1-2 tablet  1-2 tablet Oral Q4H PRN Doutova, Anastassia, MD      . metoprolol succinate (TOPROL-XL) 24 hr tablet 50 mg  50 mg Oral BID Toy Baker, MD   50 mg at 09/08/18 2353  . ondansetron (ZOFRAN) tablet 4 mg  4 mg Oral Q6H PRN Toy Baker, MD       Or  . ondansetron (ZOFRAN) injection 4 mg  4 mg Intravenous Q6H PRN Doutova, Anastassia, MD      . pantoprazole (PROTONIX) EC tablet 40 mg  40 mg Oral Daily Doutova, Anastassia, MD       . sodium chloride flush (NS) 0.9 % injection 3 mL  3 mL Intravenous Q12H Doutova, Anastassia, MD      . sodium chloride flush (NS) 0.9 % injection 3 mL  3 mL Intravenous PRN Toy Baker, MD        OBJECTIVE: older White man examined in bed  Vitals:   09/09/18 0101 09/09/18 0543  BP: 124/85 120/87  Pulse: 60 (!) 57  Resp: 18 17  Temp: 98.5 F (36.9 C) 98.6 F (37 C)  SpO2: 100% 96%     There is no height or weight on file to calculate BMI.   Wt Readings from Last 3 Encounters:  09/06/18 165 lb (74.8 kg)  05/26/17 178 lb (80.7 kg)  03/14/17 178 lb (80.7 kg)      ECOG FS:2 - Symptomatic, <50% confined to bed  Pandemic distance preserved  LAB RESULTS:  CMP     Component Value Date/Time   NA 138 09/09/2018 0532   NA 138 05/26/2017 1042   K 4.1 09/09/2018 0532   CL 109 09/09/2018 0532   CO2 23 09/09/2018 0532   GLUCOSE 112 (H) 09/09/2018 0532   BUN 30 (H) 09/09/2018 0532   BUN 14 05/26/2017 1042   CREATININE 1.12 09/09/2018 0532   CREATININE 1.01 02/02/2016 1542   CALCIUM 8.5 (L) 09/09/2018 0532   PROT 5.7 (L) 09/09/2018 0532   ALBUMIN 3.7 09/09/2018 0532   AST 24 09/09/2018 0532   ALT 16 09/09/2018 0532   ALKPHOS 57 09/09/2018 0532   BILITOT 1.4 (H) 09/09/2018 0532   GFRNONAA 60 (L) 09/09/2018 0532   GFRAA >60 09/09/2018 0532    No results found for: TOTALPROTELP, ALBUMINELP, A1GS, A2GS, BETS, BETA2SER, GAMS, MSPIKE, SPEI  No results found for: KPAFRELGTCHN, LAMBDASER, KAPLAMBRATIO  Lab Results  Component Value Date   WBC 2.8 (L) 09/09/2018   NEUTROABS 1.4 (L) 09/09/2018   HGB 7.4 (L) 09/09/2018   HCT 25.2 (L) 09/09/2018   MCV 78.3 (L) 09/09/2018   PLT 138 (L) 09/09/2018    _0 @  No results found for: LABCA2  No components found for: JOACZY606  No results for input(s): INR in the last 168 hours.  No results found for: LABCA2  No results found for: TKZ601  No results found for: UXN235  No results found for: TDD220  No  results found for: CA2729  No components found for: HGQUANT  No results found for: CEA1 / No results found for: CEA1   No results found for: AFPTUMOR  No results found for: CHROMOGRNA  No results found for: PSA1  Admission on 09/08/2018  Component Date Value Ref Range Status  . B Natriuretic Peptide 09/08/2018 511.2* 0.0 - 100.0 pg/mL Final   Performed at [facility]  [address], [place], [place] [address]  . WBC 09/08/2018 2.6* 4.0 - 10.5 K/uL Final  . RBC 09/08/2018 2.87* 4.22 - 5.81 MIL/uL Final  . Hemoglobin 09/08/2018 6.0* 13.0 - 17.0 g/dL Final   Comment: REPEATED TO VERIFY Reticulocyte Hemoglobin testing may be clinically indicated, consider ordering this additional test [id number] THIS CRITICAL RESULT HAS VERIFIED AND BEEN CALLED TO C.HALL BY NATHAN THOMPSON ON 04 24 2020 AT 1716, AND HAS BEEN READ BACK. CRITICAL RESULT VERIFIED   . HCT 09/08/2018 22.0* 39.0 - 52.0 % Final  . MCV 09/08/2018 76.7* 80.0 - 100.0 fL Final  . MCH 09/08/2018 20.9* 26.0 - 34.0 pg Final  . MCHC 09/08/2018 27.3* 30.0 - 36.0 g/dL Final  . RDW 09/08/2018 21.7* 11.5 - 15.5 % Final  . Platelets 09/08/2018 156  150 - 400 K/uL Final  . nRBC 09/08/2018 0.0  0.0 - 0.2 % Final  . Neutrophils Relative % 09/08/2018 47  % Final  . Neutro Abs 09/08/2018 1.2* 1.7 - 7.7 K/uL Final  . Lymphocytes Relative 09/08/2018 27  % Final  . Lymphs Abs 09/08/2018 0.7  0.7 - 4.0 K/uL Final  . Monocytes Relative 09/08/2018 23  % Final  . Monocytes Absolute 09/08/2018 0.6  0.1 - 1.0 K/uL Final  . Eosinophils Relative 09/08/2018 2  % Final  . Eosinophils Absolute 09/08/2018 0.1  0.0 - 0.5 K/uL Final  . Basophils Relative 09/08/2018 1  % Final  . Basophils Absolute 09/08/2018 0.0  0.0 - 0.1 K/uL Final  . Immature Granulocytes 09/08/2018 0  % Final  . Abs Immature Granulocytes 09/08/2018 0.01  0.00 - 0.07 K/uL Final  . Schistocytes 09/08/2018 PRESENT   Final  . Tear Drop Cells 09/08/2018 PRESENT    Final  . Polychromasia 09/08/2018 PRESENT   Final  . Ovalocytes 09/08/2018 PRESENT   Final   Performed at [facility], [place] [address], [place], [place] [address]  . Troponin I 09/08/2018 <0.03  <0.03 ng/mL Final   Performed at [facility], [place] [address], [place], [place] [address]  . D-Dimer, Quant 09/08/2018 2.71* 0.00 - 0.50 ug/mL-FEU Final   Comment: (NOTE) At the manufacturer cut-off of 0.50 ug/mL FEU, this assay has been documented to exclude PE with a sensitivity and negative predictive value of 97 to 99%.  At this time, this assay has not been approved by the FDA to exclude DVT/VTE. Results should be correlated with clinical presentation. Performed at [facility], [place] [address] [place], [place] [address]   . SARS Coronavirus 2 09/08/2018 NEGATIVE  NEGATIVE Final   Comment: (NOTE) If result is NEGATIVE SARS-CoV-2 target nucleic acids are NOT DETECTED. The SARS-CoV-2 RNA is generally detectable in upper and lower  respiratory specimens during the acute phase of infection. The lowest  concentration of SARS-CoV-2 viral copies this assay can detect is 250  copies / mL. A negative result does not preclude SARS-CoV-2 infection  and should not be used as the sole basis for treatment or other  patient management decisions.  A negative result may occur with  improper specimen collection / handling, submission of specimen other  than nasopharyngeal swab, presence of viral mutation(s) within the  areas targeted by this assay, and inadequate number of viral copies  (<250 copies / mL). A negative result must be combined with clinical  observations, patient history, and epidemiological information. If result is POSITIVE SARS-CoV-2 target nucleic acids are DETECTED. The SARS-CoV-2 RNA is generally detectable in upper and  lower  respiratory specimens dur                          ing the acute phase of infection.  Positive   results are indicative of active infection with SARS-CoV-2.  Clinical  correlation with patient history and other diagnostic information is  necessary to determine patient infection status.  Positive results do  not rule out bacterial infection or co-infection with other viruses. If result is PRESUMPTIVE POSTIVE SARS-CoV-2 nucleic acids MAY BE PRESENT.   A presumptive positive result was obtained on the submitted specimen  and confirmed on repeat testing.  While 2019 novel coronavirus  (SARS-CoV-2) nucleic acids may be present in the submitted sample  additional confirmatory testing may be necessary for epidemiological  and / or clinical management purposes  to differentiate between  SARS-CoV-2 and other Sarbecovirus currently known to infect humans.  If clinically indicated additional testing with an alternate test  methodology [phone]) is advised. The SARS-CoV-2 RNA is generally  detectable in upper and lower respiratory sp                          ecimens during the acute  phase of infection. The expected result is Negative. Fact Sheet for Patients:  StrictlyIdeas.no Fact Sheet for Healthcare Providers: BankingDealers.co.za This test is not yet approved or cleared by the [place] FDA and has been authorized for detection and/or diagnosis of SARS-CoV-2 by FDA under an Emergency Use Authorization (EUA).  This EUA will remain in effect (meaning this test can be used) for the duration of the COVID-19 declaration under Section 564(b)(1) of the Act, 21 U.S.C. section 360bbb-3(b)(1), unless the authorization is terminated or revoked sooner. Performed at [facility], [place] [address], [place], [place] [address]   . Lactic Acid, Venous 09/08/2018 1.5  0.5 - 1.9 mmol/L Final   Performed at [facility] [address] [place], [place] [address]  . Specimen Description 09/08/2018    Final                    Value:BLOOD LEFT FOREARM Performed at [facility], [place] [address], [place], [place] [address]   . Special Requests 09/08/2018    Final                   Value:BOTTLES DRAWN AEROBIC AND ANAEROBIC Blood Culture adequate volume Performed at [facility] [address] [place], [place] [address]   . Culture 09/08/2018 PENDING   Incomplete  . Report Status 09/08/2018 PENDING   Incomplete  . Sodium 09/08/2018 138  135 - 145 mmol/L Final  . Potassium 09/08/2018 4.2  3.5 - 5.1 mmol/L Final  . Chloride 09/08/2018 109  98 - 111 mmol/L Final  . CO2 09/08/2018 23  22 - 32 mmol/L Final  . Glucose, Bld 09/08/2018 106* 70 - 99 mg/dL Final  . BUN 09/08/2018 32* 8 - 23 mg/dL Final  . Creatinine, Ser 09/08/2018 1.31* 0.61 - 1.24 mg/dL Final  . Calcium 09/08/2018 8.5* 8.9 - 10.3 mg/dL Final  . Total Protein 09/08/2018 6.3* 6.5 - 8.1 g/dL Final  . Albumin 09/08/2018 4.1  3.5 - 5.0 g/dL Final  . AST 09/08/2018 29  15 - 41 U/L Final  . ALT 09/08/2018 18  0 - 44 U/L Final  . Alkaline Phosphatase 09/08/2018 62  38 - 126 U/L Final  .  Total Bilirubin 09/08/2018 1.1  0.3 - 1.2 mg/dL Final  . GFR calc non Af Amer 09/08/2018 50* >60 mL/min Final  . GFR calc Af Amer 09/08/2018 58* >60 mL/min Final  . Anion gap 09/08/2018 6  5 - 15 Final   Performed at [facility], [place] [address], [place], [place] [address]  . Procalcitonin 09/08/2018 <0.10  ng/mL Final   Comment:        Interpretation: PCT (Procalcitonin) <= 0.5 ng/mL: Systemic infection (sepsis) is not likely. Local bacterial infection is possible. (NOTE)       Sepsis PCT Algorithm           Lower Respiratory Tract                                      Infection PCT Algorithm    ----------------------------     ----------------------------         PCT < 0.25 ng/mL                PCT < 0.10 ng/mL         Strongly encourage             Strongly discourage   discontinuation of antibiotics    initiation of  antibiotics    ----------------------------     -----------------------------       PCT 0.25 - 0.50 ng/mL            PCT 0.10 - 0.25 ng/mL               OR       >80% decrease in PCT            Discourage initiation of                                            antibiotics      Encourage discontinuation           of antibiotics    ----------------------------     -----------------------------         PCT >= 0.50 ng/mL              PCT 0.26 - 0.50 ng/mL               AND                                 <80% decrease in PCT             Encourage initiation of                                             antibiotics       Encourage continuation           of antibiotics    ----------------------------     -----------------------------        PCT >= 0.50 ng/mL                  PCT > 0.50 ng/mL               AND  increase in PCT                  Strongly encourage                                      initiation of antibiotics    Strongly encourage escalation           of antibiotics                                     -----------------------------                                           PCT <= 0.25 ng/mL                                                 OR                                        > 80% decrease in PCT                                     Discontinue / Do not initiate                                             antibiotics Performed at [facility] [address], [place], [place] [address]   . LDH 09/08/2018 165  98 - 192 U/L Final   Performed at [facility] [address], [place], [place] [address]  . Ferritin 09/08/2018 7* 24 - 336 ng/mL Final   Performed at [facility] [address] [place], [place] [address]  . Triglycerides 09/08/2018 31  <150 mg/dL Final   Performed at [facility] [address] [place], [place] [address]  . Fibrinogen 09/08/2018 262  210 - 475 mg/dL Final    Performed at [facility] [address] [place], [place] [address]  . CRP 09/08/2018 <0.8  <1.0 mg/dL Final   Performed at [facility] [address], [place], [place] [address]  . Fecal Occult Bld 09/08/2018 NEGATIVE  NEGATIVE Final  . ABO/RH(D) 09/08/2018 O POS   Final  . Antibody Screen 09/08/2018 NEG   Final  . Sample Expiration 09/08/2018 09/11/2018   Final  . Unit Number 09/08/2018 [id number]   Final  . Blood Component Type 09/08/2018 RED CELLS,LR   Final  . Unit division 09/08/2018 00   Final  . Status of Unit 09/08/2018 ISSUED,FINAL   Final  . Transfusion Status 09/08/2018 OK TO TRANSFUSE   Final  . Crossmatch Result 09/08/2018    Final                   Value:Compatible Performed at [facility]  [place] [address], [place], [place] [address]   . Unit Number 09/08/2018 [id number]   Final  . Blood Component Type 09/08/2018 RED CELLS,LR   Final  . Unit division 09/08/2018 00   Final  . Status of Unit 09/08/2018 ISSUED,FINAL   Final  . Transfusion Status 09/08/2018 OK TO TRANSFUSE   Final  . Crossmatch Result 09/08/2018 Compatible   Final  . Order Confirmation 09/08/2018    Final                   Value:ORDER PROCESSED BY BLOOD BANK Performed at [facility], [place] [address], [place], [place] [address]   . ABO/RH(D) 09/08/2018    Final                   Value:O POS Performed at [facility], [place] [address] [place], [place] [address]   . ISSUE DATE / TIME 09/08/2018 [id number]   Final  . Blood Product Unit Number 09/08/2018 [id number]   Final  . PRODUCT CODE 09/08/2018 [id number]   Final  . Unit Type and Rh 09/08/2018 5100   Final  . Blood Product Expiration Date 09/08/2018 [id number]   Final  . ISSUE DATE / TIME 09/08/2018 [id number]   Final  . Blood Product Unit Number 09/08/2018 [id number]   Final  . PRODUCT CODE 09/08/2018 [id number]   Final  . Unit Type and  Rh 09/08/2018 5100   Final  . Blood Product Expiration Date 09/08/2018 [id number]   Final  . Vitamin B-12 09/08/2018 325  180 - 914 pg/mL Final   Comment: (NOTE) This assay is not validated for testing neonatal or myeloproliferative syndrome specimens for Vitamin B12 levels. Performed at [facility], [place] [address], [place], [place] [address]   . Folate 09/08/2018 29.8  >5.9 ng/mL Final   Comment: RESULTS CONFIRMED BY MANUAL DILUTION Performed at [facility] [address] [place], [place] [address]   . Iron 09/08/2018 7* 45 - 182 ug/dL Final  . TIBC 09/08/2018 420  250 - 450 ug/dL Final  . Saturation Ratios 09/08/2018 2* 17.9 - 39.5 % Final  . UIBC 09/08/2018 413  ug/dL Final   Performed at [facility], [place] [address], [place], [place] [address]  . DAT, complement 09/08/2018 NEG   Final  . DAT, IgG 09/08/2018    Final                   Value:NEG Performed at [facility], [place] [address], [place], [place] [address]   . Smear Review 09/09/2018 SMEAR STAINED AND AVAILABLE FOR REVIEW   Final   Performed at [facility] [address], [place], [place] [address]  . Retic Ct Pct 09/09/2018 1.2  0.4 - 3.1 % Final   RESULT CONFIRMED BY MANUAL DILUTION  . RBC. 09/09/2018 3.22* 4.22 - 5.81 MIL/uL Final  . Retic Count, Absolute 09/09/2018 40.5  19.0 - 186.0 K/uL Final  . Immature Retic Fract 09/09/2018 5.3  2.3 - 15.9 % Final   Performed at [facility], [place] [address] [place], [place] [address]  . Magnesium 09/09/2018 2.4  1.7 - 2.4 mg/dL Final   Performed at [facility] [address] [place] [address], [place], [place] [address]  . Phosphorus 09/09/2018 4.0  2.5 - 4.6 mg/dL Final   Performed at [facility] [address], [place],  [address]  .  TSH 09/09/2018 2.531  0.350 - 4.500 uIU/mL Final   Comment: Performed by a 3rd Generation  assay with a functional sensitivity of <=0.01 uIU/mL. Performed at [facility], [place] [address] [place], [place] [address]   . Sodium 09/09/2018 138  135 - 145 mmol/L Final  . Potassium 09/09/2018 4.1  3.5 - 5.1 mmol/L Final  . Chloride 09/09/2018 109  98 - 111 mmol/L Final  . CO2 09/09/2018 23  22 - 32 mmol/L Final  . Glucose, Bld 09/09/2018 112* 70 - 99 mg/dL Final  . BUN 09/09/2018 30* 8 - 23 mg/dL Final  . Creatinine, Ser 09/09/2018 1.12  0.61 - 1.24 mg/dL Final  . Calcium 09/09/2018 8.5* 8.9 - 10.3 mg/dL Final  . Total Protein 09/09/2018 5.7* 6.5 - 8.1 g/dL Final  . Albumin 09/09/2018 3.7  3.5 - 5.0 g/dL Final  . AST 09/09/2018 24  15 - 41 U/L Final  . ALT 09/09/2018 16  0 - 44 U/L Final  . Alkaline Phosphatase 09/09/2018 57  38 - 126 U/L Final  . Total Bilirubin 09/09/2018 1.4* 0.3 - 1.2 mg/dL Final  . GFR calc non Af Amer 09/09/2018 60* >60 mL/min Final  . GFR calc Af Amer 09/09/2018 >60  >60 mL/min Final  . Anion gap 09/09/2018 6  5 - 15 Final   Performed at [facility], [place] [address] [place], [place] [address]  . WBC 09/09/2018 2.8* 4.0 - 10.5 K/uL Final  . RBC 09/09/2018 3.22* 4.22 - 5.81 MIL/uL Final  . Hemoglobin 09/09/2018 7.4* 13.0 - 17.0 g/dL Final  . HCT 09/09/2018 25.2* 39.0 - 52.0 % Final  . MCV 09/09/2018 78.3* 80.0 - 100.0 fL Final  . MCH 09/09/2018 23.0* 26.0 - 34.0 pg Final  . MCHC 09/09/2018 29.4* 30.0 - 36.0 g/dL Final  . RDW 09/09/2018 20.7* 11.5 - 15.5 % Final  . Platelets 09/09/2018 138* 150 - 400 K/uL Final  . nRBC 09/09/2018 0.0  0.0 - 0.2 % Final  . Neutrophils Relative % 09/09/2018 48  % Final  . Neutro Abs 09/09/2018 1.4* 1.7 - 7.7 K/uL Final  . Lymphocytes Relative 09/09/2018 27  % Final  . Lymphs Abs 09/09/2018 0.8  0.7 - 4.0 K/uL Final  . Monocytes Relative 09/09/2018 21  % Final  . Monocytes Absolute 09/09/2018 0.6  0.1 - 1.0 K/uL Final  . Eosinophils Relative 09/09/2018 3  % Final  . Eosinophils  Absolute 09/09/2018 0.1  0.0 - 0.5 K/uL Final  . Basophils Relative 09/09/2018 1  % Final  . Basophils Absolute 09/09/2018 0.0  0.0 - 0.1 K/uL Final  . Immature Granulocytes 09/09/2018 0  % Final  . Abs Immature Granulocytes 09/09/2018 0.01  0.00 - 0.07 K/uL Final   Performed at [facility], [place] [address], [place],  [address]  . aPTT 09/09/2018 35  24 - 36 seconds Final   Performed at [facility], [place] [address], [place],  [address]    (this displays the last labs from the last 3 days)  No results found for: TOTALPROTELP, ALBUMINELP, A1GS, A2GS, BETS, BETA2SER, GAMS, MSPIKE, SPEI (this displays SPEP labs)  No results found for: KPAFRELGTCHN, LAMBDASER, KAPLAMBRATIO (kappa/lambda light chains)  No results found for: HGBA, HGBA2QUANT, HGBFQUANT, HGBSQUAN (Hemoglobinopathy evaluation)   Lab Results  Component Value Date   LDH 165 09/08/2018    Lab Results  Component Value Date   IRON 7 (L) 09/08/2018   TIBC 420 09/08/2018  IRONPCTSAT 2 (L) 09/08/2018   (Iron and TIBC)  Lab Results  Component Value Date   FERRITIN 7 (L) 09/08/2018    Urinalysis No results found for: COLORURINE, APPEARANCEUR, LABSPEC, PHURINE, GLUCOSEU, HGBUR, BILIRUBINUR, KETONESUR, PROTEINUR, UROBILINOGEN, NITRITE, LEUKOCYTESUR   STUDIES: Ct Angio Chest Pe W And/or Wo Contrast  Result Date: 09/08/2018 CLINICAL DATA:  Shortness of breath EXAM: CT ANGIOGRAPHY CHEST WITH CONTRAST TECHNIQUE: Multidetector CT imaging of the chest was performed using the standard protocol during bolus administration of intravenous contrast. Multiplanar CT image reconstructions and MIPs were obtained to evaluate the vascular anatomy. CONTRAST:  147m OMNIPAQUE IOHEXOL 350 MG/ML SOLN COMPARISON:  Chest x-ray earlier today. FINDINGS: Cardiovascular: Cardiomegaly. Aortic atherosclerosis. No filling defects in the pulmonary arteries to suggest pulmonary emboli. Mediastinum/Nodes:  No enlarged mediastinal, hilar, or axillary lymph nodes. Thyroid gland, trachea, and esophagus demonstrate no significant findings. Lungs/Pleura: Dependent bibasilar atelectasis. No confluent opacities otherwise. No effusions. Upper Abdomen: Imaging into the upper abdomen shows no acute findings. Musculoskeletal: Chest wall soft tissues are unremarkable. No acute bony abnormality. Review of the MIP images confirms the above findings. IMPRESSION: No evidence of pulmonary embolus. Cardiomegaly. Dependent/bibasilar atelectasis. Aortic Atherosclerosis (ICD10-I70.0). Electronically Signed   By: KRolm BaptiseM.D.   On: 09/08/2018 19:27   Dg Chest Port 1 View  Result Date: 09/08/2018 CLINICAL DATA:  Shortness of breath EXAM: PORTABLE CHEST 1 VIEW COMPARISON:  07/28/2016 FINDINGS: Cardiomegaly with small left pleural effusion, central vascular congestion and mild edema. Patchy atelectasis or minimal infiltrate at the left base. No pneumothorax. IMPRESSION: 1. Cardiomegaly with vascular congestion, mild interstitial edema and small left effusion 2. Patchy atelectasis or minimal infiltrate at the left base Electronically Signed   By: KDonavan FoilM.D.   On: 09/08/2018 16:13    REVIEW OF PERIPHERAL BLOOD FILM: Pretransfusion smear not located; smear from 09/09/2018 CBC shows a mixture of RBCs, many severely hypochromic, significant anisocytosis but no schistocytes, no tailed poikylocytes, no NRBCs; mild rouleaux; white cell series shows no immaturity or dysplasia; platelets WNL  ASSESSMENT: 85y.o. [place] man admitted 09/08/2018 with severe iron deficiency anemia  PLAN: I discussed the findings to date with the patient. His SOB and moderate fatigue are attributable to his severe anemia. The anemia itself is due to severe iron deficiency: there is no evidence of hemolysis, no leukoerythorblastic or leukemic findings on smear, no vitamin deficiency, no coagulopathy and no significant renal insufficiency.   Accordingly there is no need for bone marrow biopsy or further hematologic workup. He does need a full GI evaluation--this can be done on an outpatient basis if the patient is stable or inpatient if not.   He also needs iron replacement. I will write for feraheme to be given today. If patient is stable tomorrow he could be discharged and I will give him a second feraheme dose next week at the cancer center. In that case I would also contact [facility] next week for a full GI workup  Appreciate consulting with you on this case!       GChauncey Cruel MD   09/09/2018 8:56 AM [facility] StreetADonalsonville [place] [address]Tel. [phone]   Fax. [phone]

## 2018-09-09 NOTE — Progress Notes (Signed)
SATURATION QUALIFICATIONS: (This note is used to comply with regulatory documentation for home oxygen)  Patient Saturations on Room Air at Rest = 99%  Patient Saturations on Room Air while Ambulating = 85%  Patient Saturations on -- Liters of oxygen while Ambulating = TBD  Please briefly explain why patient needs home oxygen: to maintain appropriate SaO2 levels.   Blondell Reveal Kistler PT 09/09/2018  Acute Rehabilitation Services Pager [phone] Office [phone]

## 2018-09-09 NOTE — Progress Notes (Signed)
[facility]                                                                              Patient Demographics  John Holder, is a 85 y.o. male, DOB - January 16, 1932, FXT:[id number]  Admit date - 09/08/2018   Admitting Physician John Baker, MD  Outpatient Primary MD for the patient is John Bunting, MD  Outpatient specialists:   LOS - 1  days   Medical records reviewed and are as summarized below:    Chief Complaint  Patient presents with   Shortness of Breath       Brief summary   Patient is a 86 year old male with hypertension, atrial fibrillation on eliquis, hyperlipidemia, history of nephrectomy for renal mass in the past.  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  At baseline he is very functionally active (used to play competitive tennis).  He noticed fatigue and generalized weakness.  Patient had a tele-visit with his cardiologist on 4/16 and metoprolol was increased.  Denied any fevers or chills, reported weight loss and swelling in his ankles.  Patient had another tele-visit on 4/22, he had labs done which showed hemoglobin of 6.0.  Patient otherwise denied any frank bleeding, no hematemesis or hematochezia or melena.   Assessment & Plan    Principal Problem: Acute microcytic symptomatic anemia secondary to iron deficiency -Patient presented with hemoglobin of 6.0, last hemoglobin 13.8 in 04/2018 -PT negative, initially suspected hematological disorder hence hematology oncology was consulted, patient was seen by Dr. Jana Holder. -Anemia panel showed severe iron deficiency anemia, per hematology, no evidence of hemolysis, leuko-erythroblastic or leukemic findings.  No coagulopathy or significant renal insufficiency, does not need any bone marrow biopsy or hematological work-up. -FOBT is negative, prior colonoscopy in June/2011 was  negative.  Discussed with gastroenterology, Dr. Cristina Holder, patient had 2 surveillance colonoscopies, in 2006 and 2003 were negative except for benign polyps.   -Received 1 unit of packed RBCs, hemoglobin 7.4 today, receiving Feraheme x1 -Patient is also on Eliquis for atrial fibrillation.  Discussed with GI, patient could have chronic blood loss, recommended inpatient endoscopy before putting back on anticoagulation. -Continue PPI  Active Problems:   Hypoxia: Likely due to severe symptomatic anemia -CTA negative for PE, COVID negative -Chest x-ray had shown patchy atelectasis or minimal infiltrates at the left base, patient was placed on Zithromax and Rocephin    Atrial fibrillation, chronic -Continue beta-blocker for rate control, -Hold Eliquis until GI work-up    HTN (hypertension) BP currently stable, continue Toprol-XL  Code Status: Full code DVT Prophylaxis:  SCD's Eliquis on hold Family Communication: Discussed in detail with the patient, all imaging results, lab results explained to the patient    Disposition Plan: Will await GI recommendations  Time Spent in minutes   35 minutes  Procedures:  CT angiogram of chest  Consultants:   Hematology oncology GI  Antimicrobials:   Anti-infectives (From admission, onward)   Start     Dose/Rate Route Frequency Ordered Stop   09/08/18 1645  cefTRIAXone (  ROCEPHIN) 1 g in sodium chloride 0.9 % 100 mL IVPB     1 g 200 mL/hr over 30 Minutes Intravenous  Once 09/08/18 1633 09/08/18 1811   09/08/18 1645  azithromycin (ZITHROMAX) tablet 500 mg     500 mg Oral  Once 09/08/18 1633 09/08/18 1702         Medications  Scheduled Meds:  sodium chloride   Intravenous Once   metoprolol succinate  50 mg Oral BID   pantoprazole  40 mg Oral Daily   sodium chloride flush  3 mL Intravenous Q12H   Continuous Infusions:  PRN Meds:.acetaminophen **OR** acetaminophen, ALPRAZolam, HYDROcodone-acetaminophen, ondansetron **OR** ondansetron  (ZOFRAN) IV, sodium chloride flush      Subjective:   John Holder was seen and examined today.  Feeling better after the blood transfusion, O2 sats 96% on room air.  Has not gotten up from the bed yet.  Weakness is improving.  Patient denies chest pain, shortness of breath, abdominal pain, N/V/D/C,  numbess, tingling.   Objective:   Vitals:   09/08/18 2331 09/09/18 0101 09/09/18 0543 09/09/18 0858  BP: (!) 151/85 124/85 120/87   Pulse: 77 60 (!) 57 70  Resp: (!) '21 18 17   ' Temp: 98.2 F (36.8 C) 98.5 F (36.9 C) 98.6 F (37 C)   TempSrc:  Oral Oral   SpO2: 94% 100% 96%     Intake/Output Summary (Last 24 hours) at 09/09/2018 1112 Last data filed at 09/09/2018 0840 Gross per 24 hour  Intake 1260.83 ml  Output --  Net 1260.83 ml     Wt Readings from Last 3 Encounters:  09/06/18 74.8 kg  05/26/17 80.7 kg  03/14/17 80.7 kg     Exam  General: Alert and oriented x 3, NAD  Eyes: Pale conjunctiva  HEENT:  Atraumatic, normocephalic, normal oropharynx  Cardiovascular: S1 S2 auscultated,  Regular rate and rhythm.  Respiratory: Clear to auscultation bilaterally, no wheezing, rales or rhonchi  Gastrointestinal: Soft, nontender, nondistended, + bowel sounds  Ext: no pedal edema bilaterally  Neuro: No new deficit  Musculoskeletal: No digital cyanosis, clubbing  Skin: No rashes  Psych: Normal affect and demeanor, alert and oriented x3    Data Reviewed:  I have personally reviewed following labs and imaging studies  Micro Results Recent Results (from the past 240 hour(s))  SARS Coronavirus 2 [facility] order, Performed in [place] hospital lab)     Status: None   Collection Time: 09/08/18  3:47 PM  Result Value Ref Range Status   SARS Coronavirus 2 NEGATIVE NEGATIVE Final    Comment: (NOTE) If result is NEGATIVE SARS-CoV-2 target nucleic acids are NOT DETECTED. The SARS-CoV-2 RNA is generally detectable in upper and lower  respiratory specimens during the  acute phase of infection. The lowest  concentration of SARS-CoV-2 viral copies this assay can detect is 250  copies / mL. A negative result does not preclude SARS-CoV-2 infection  and should not be used as the sole basis for treatment or other  patient management decisions.  A negative result may occur with  improper specimen collection / handling, submission of specimen other  than nasopharyngeal swab, presence of viral mutation(s) within the  areas targeted by this assay, and inadequate number of viral copies  (<250 copies / mL). A negative result must be combined with clinical  observations, patient history, and epidemiological information. If result is POSITIVE SARS-CoV-2 target nucleic acids are DETECTED. The SARS-CoV-2 RNA is generally detectable in upper and lower  respiratory specimens dur ing the acute phase of infection.  Positive  results are indicative of active infection with SARS-CoV-2.  Clinical  correlation with patient history and other diagnostic information is  necessary to determine patient infection status.  Positive results do  not rule out bacterial infection or co-infection with other viruses. If result is PRESUMPTIVE POSTIVE SARS-CoV-2 nucleic acids MAY BE PRESENT.   A presumptive positive result was obtained on the submitted specimen  and confirmed on repeat testing.  While 2019 novel coronavirus  (SARS-CoV-2) nucleic acids may be present in the submitted sample  additional confirmatory testing may be necessary for epidemiological  and / or clinical management purposes  to differentiate between  SARS-CoV-2 and other Sarbecovirus currently known to infect humans.  If clinically indicated additional testing with an alternate test  methodology [phone]) is advised. The SARS-CoV-2 RNA is generally  detectable in upper and lower respiratory sp ecimens during the acute  phase of infection. The expected result is Negative. Fact Sheet for Patients:   StrictlyIdeas.no Fact Sheet for Healthcare Providers: BankingDealers.co.za This test is not yet approved or cleared by the [place] FDA and has been authorized for detection and/or diagnosis of SARS-CoV-2 by FDA under an Emergency Use Authorization (EUA).  This EUA will remain in effect (meaning this test can be used) for the duration of the COVID-19 declaration under Section 564(b)(1) of the Act, 21 U.S.C. section 360bbb-3(b)(1), unless the authorization is terminated or revoked sooner. Performed at [facility], [place] [address] [place], [place] [address]   Blood Culture (routine x 2)     Status: None (Preliminary result)   Collection Time: 09/08/18  3:47 PM  Result Value Ref Range Status   Specimen Description   Final    BLOOD LEFT FOREARM Performed at [facility], [place] [address], [place], [place] [address]    Special Requests   Final    BOTTLES DRAWN AEROBIC AND ANAEROBIC Blood Culture adequate volume Performed at [place] [address] [place], [place] [address]    Culture PENDING  Incomplete   Report Status PENDING  Incomplete    Radiology Reports Ct Angio Chest Pe W And/or Wo Contrast  Result Date: 09/08/2018 CLINICAL DATA:  Shortness of breath EXAM: CT ANGIOGRAPHY CHEST WITH CONTRAST TECHNIQUE: Multidetector CT imaging of the chest was performed using the standard protocol during bolus administration of intravenous contrast. Multiplanar CT image reconstructions and MIPs were obtained to evaluate the vascular anatomy. CONTRAST:  143m OMNIPAQUE IOHEXOL 350 MG/ML SOLN COMPARISON:  Chest x-ray earlier today. FINDINGS: Cardiovascular: Cardiomegaly. Aortic atherosclerosis. No filling defects in the pulmonary arteries to suggest pulmonary emboli. Mediastinum/Nodes: No enlarged mediastinal, hilar, or axillary lymph nodes. Thyroid gland, trachea, and esophagus demonstrate no  significant findings. Lungs/Pleura: Dependent bibasilar atelectasis. No confluent opacities otherwise. No effusions. Upper Abdomen: Imaging into the upper abdomen shows no acute findings. Musculoskeletal: Chest wall soft tissues are unremarkable. No acute bony abnormality. Review of the MIP images confirms the above findings. IMPRESSION: No evidence of pulmonary embolus. Cardiomegaly. Dependent/bibasilar atelectasis. Aortic Atherosclerosis (ICD10-I70.0). Electronically Signed   By: KRolm BaptiseM.D.   On: 09/08/2018 19:27   Dg Chest Port 1 View  Result Date: 09/08/2018 CLINICAL DATA:  Shortness of breath EXAM: PORTABLE CHEST 1 VIEW COMPARISON:  07/28/2016 FINDINGS: Cardiomegaly with small left pleural effusion, central vascular congestion and mild edema. Patchy atelectasis or minimal infiltrate at the left base. No pneumothorax. IMPRESSION: 1. Cardiomegaly with vascular congestion, mild  interstitial edema and small left effusion 2. Patchy atelectasis or minimal infiltrate at the left base Electronically Signed   By: Donavan Foil M.D.   On: 09/08/2018 16:13    Lab Data:  CBC: Recent Labs  Lab 09/08/18 1556 09/09/18 0532  WBC 2.6* 2.8*  NEUTROABS 1.2* 1.4*  HGB 6.0* 7.4*  HCT 22.0* 25.2*  MCV 76.7* 78.3*  PLT 156 270*   Basic Metabolic Panel: Recent Labs  Lab 09/08/18 1556 09/09/18 0532  NA 138 138  K 4.2 4.1  CL 109 109  CO2 23 23  GLUCOSE 106* 112*  BUN 32* 30*  CREATININE 1.31* 1.12  CALCIUM 8.5* 8.5*  MG  --  2.4  PHOS  --  4.0   GFR: Estimated Creatinine Clearance: 51.9 mL/min (by C-G formula based on SCr of 1.12 mg/dL). Liver Function Tests: Recent Labs  Lab 09/08/18 1556 09/09/18 0532  AST 29 24  ALT 18 16  ALKPHOS 62 57  BILITOT 1.1 1.4*  PROT 6.3* 5.7*  ALBUMIN 4.1 3.7   No results for input(s): LIPASE, AMYLASE in the last 168 hours. No results for input(s): AMMONIA in the last 168 hours. Coagulation Profile: No results for input(s): INR, PROTIME in the  last 168 hours. Cardiac Enzymes: Recent Labs  Lab 09/08/18 1556  TROPONINI <0.03   BNP (last 3 results) No results for input(s): PROBNP in the last 8760 hours. HbA1C: No results for input(s): HGBA1C in the last 72 hours. CBG: No results for input(s): GLUCAP in the last 168 hours. Lipid Profile: Recent Labs    09/08/18 1556  TRIG 31   Thyroid Function Tests: Recent Labs    09/09/18 0532  TSH 2.531   Anemia Panel: Recent Labs    09/08/18 1556 09/09/18 0532  VITAMINB12 325  --   FOLATE 29.8  --   FERRITIN 7*  --   TIBC 420  --   IRON 7*  --   RETICCTPCT  --  1.2   Urine analysis:    Component Value Date/Time   COLORURINE YELLOW 09/09/2018 1050   APPEARANCEUR CLEAR 09/09/2018 1050   LABSPEC 1.036 (H) 09/09/2018 1050   PHURINE 5.0 09/09/2018 1050   GLUCOSEU NEGATIVE 09/09/2018 1050   HGBUR NEGATIVE 09/09/2018 1050   BILIRUBINUR NEGATIVE 09/09/2018 1050   KETONESUR NEGATIVE 09/09/2018 1050   PROTEINUR NEGATIVE 09/09/2018 1050   NITRITE NEGATIVE 09/09/2018 1050   LEUKOCYTESUR NEGATIVE 09/09/2018 1050     Savva Beamer M.D. [facility] 09/09/2018, 11:12 AM  Pager: [phone] Between 7am to 7pm - call Pager - [phone]  After 7pm go to [email or web address] - password [email or web address]  Call night coverage person covering after 7pm

## 2018-09-09 NOTE — Progress Notes (Signed)
Patient is refusing tele monitor. MD notified.

## 2018-09-09 NOTE — Evaluation (Signed)
Physical Therapy Evaluation Patient Details Name: John Holder MRN: [id number] DOB: June 01, 1931 Today's Date: 09/09/2018   History of Present Illness  85 year old male with hypertension, atrial fibrillation on eliquis, hyperlipidemia, history of nephrectomy for renal mass in the past.  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  At baseline he is very functionally active (used to play competitive tennis).  He noticed fatigue and generalized weakness.  Dx of anemia.   Clinical Impression  Pt admitted with above diagnosis. Pt currently with functional limitations due to the deficits listed below (see PT Problem List). Pt ambulated 90' without an assistive device, no loss of balance, SaO2 dropped to 85% with walking on room air, with standing rest, pt recovered to 99% on room air within 1 minute. Pt denied dyspnea with ambulation.  Pt will benefit from skilled PT to increase their independence and safety with mobility to allow discharge to the venue listed below.       Follow Up Recommendations No PT follow up    Equipment Recommendations  None recommended by PT    Recommendations for Other Services       Precautions / Restrictions Precautions Precautions: Other (comment) Precaution Comments: monitor O2; denies falls in past 1 year Restrictions Weight Bearing Restrictions: No      Mobility  Bed Mobility Overal bed mobility: Independent                Transfers Overall transfer level: Needs assistance   Transfers: Sit to/from Stand Sit to Stand: Supervision         General transfer comment: mild dizziness upon standing, resolved within 1 minute  Ambulation/Gait Ambulation/Gait assistance: Supervision Gait Distance (Feet): 90 Feet Assistive device: None Gait Pattern/deviations: WFL(Within Functional Limits) Gait velocity: WFL   General Gait  Details: steady without AD, SaO2 85% on room air walking, quickly rebounds to 99% on room air within 1 minute of rest  Stairs            Wheelchair Mobility    Modified Rankin (Stroke Patients Only)       Balance Overall balance assessment: Independent                                           Pertinent Vitals/Pain Pain Assessment: No/denies pain    Home Living Family/patient expects to be discharged to:: Private residence Living Arrangements: Spouse/significant other Available Help at Discharge: Family;Available 24 hours/day   Home Access: Stairs to enter   Entrance Stairs-Number of Steps: 4 Home Layout: One level Home Equipment: None      Prior Function Level of Independence: Independent               Hand Dominance        Extremity/Trunk Assessment   Upper Extremity Assessment Upper Extremity Assessment: Overall WFL for tasks assessed    Lower Extremity Assessment Lower Extremity Assessment: Overall WFL for tasks assessed    Cervical / Trunk Assessment Cervical / Trunk Assessment: Normal  Communication   Communication: No difficulties  Cognition Arousal/Alertness: Awake/alert Behavior During Therapy: WFL for tasks assessed/performed Overall Cognitive Status: Within Functional Limits for tasks assessed  General Comments      Exercises     Assessment/Plan    PT Assessment Patient needs continued PT services  PT Problem List Decreased activity tolerance       PT Treatment Interventions Gait training;Therapeutic exercise    PT Goals (Current goals can be found in the Care Plan section)  Acute Rehab PT Goals Patient Stated Goal: to be able to play tennis PT Goal Formulation: With patient Time For Goal Achievement: 09/16/18 Potential to Achieve Goals: Good    Frequency Min 3X/week   Barriers to discharge        Co-evaluation                AM-PAC PT "6 Clicks" Mobility  Outcome Measure Help needed turning from your back to your side while in a flat bed without using bedrails?: None Help needed moving from lying on your back to sitting on the side of a flat bed without using bedrails?: None Help needed moving to and from a bed to a chair (including a wheelchair)?: None Help needed standing up from a chair using your arms (e.g., wheelchair or bedside chair)?: None Help needed to walk in hospital room?: A Little Help needed climbing 3-5 steps with a railing? : None 6 Click Score: 23    End of Session Equipment Utilized During Treatment: Gait belt Activity Tolerance: Patient tolerated treatment well Patient left: in chair;with call bell/phone within reach Nurse Communication: Mobility status;Other (comment)(hypoxia with walking) PT Visit Diagnosis: Difficulty in walking, not elsewhere classified (R26.2)    Time: 8588-5027 PT Time Calculation (min) (ACUTE ONLY): 22 min   Charges:   PT Evaluation $PT Eval Low Complexity: 1 Low        Blondell Reveal Kistler PT 09/09/2018  Acute Rehabilitation Services Pager [phone] Office [phone]

## 2018-09-09 NOTE — Consult Note (Signed)
Referring Provider:  Dr. Tyler Pita Primary Care Physician:  Burnard Bunting, MD Primary Gastroenterologist:  [facility] (former patient of Dr. Teena Irani)  Reason for Consultation: Iron deficiency anemia  HPI: John Holder is a 85 y.o. male admitted to the hospital through the emergency room yesterday evening with a roughly 1 month history of progressive fatigue and dyspnea on exertion, uncharacteristic for this 85 year old who is very physically active and typically plays a couple of matches of tennis every week.  In the emergency room, he was found to have profound anemia with hemoglobin of 6.0, MCV 77, and iron deficiency with ferritin of 7 and iron saturation of 2%.  Interestingly, his stool was Hemoccult negative.  This represents a 9 gm drop from baseline just 3 months ago.  The patient does not have any significant ongoing GI tract symptoms, such as anorexia, weight loss, abdominal pain, nausea, reflux, dysphagia, constipation, diarrhea, melena, or hematochezia.  He is not on any ulcerogenic medication to speak of, just rare Aleve, perhaps twice a month.  He is, however, on Eliquis for atrial fibrillation.  The patient has a prior history of a solitary diminutive adenoma having been removed, with 2 subsequent surveillance colonoscopies (most recently 9 years ago) both negative for adenomas or other sources of chronic GI tract blood loss.  He also underwent upper endoscopy with esophageal dilatation 9 years ago.  No significant hiatal hernia noted, nor any other source of anemia.  The patient is maintained on omeprazole 20 mg daily as an outpatient.   Past Medical History:  Diagnosis Date  . Atrial fibrillation [facility])     Past Surgical History:  Procedure Laterality Date  . CARDIOVERSION N/A 02/04/2016   Procedure: CARDIOVERSION;  Surgeon: Thayer Headings, MD;  Location[facility] ENDOSCOPY;  Service: Cardiovascular;  Laterality: N/A;    Prior to Admission medications   Medication Sig Start  Date End Date Taking? Authorizing Provider  ALPRAZolam (XANAX) 0.25 MG tablet Take 0.25 mg by mouth every 6 (six) hours as needed for anxiety.  07/22/14  Yes [provider]  Barberry-Oreg Grape-Goldenseal (BERBERINE COMPLEX PO) Take 600 mg by mouth 2 (two) times a day.   Yes [provider]  ELIQUIS 5 MG TABS tablet Take 5 mg by mouth 2 (two) times daily.  12/29/15  Yes [provider]  glucosamine-chondroitin 500-400 MG tablet Take 1 tablet by mouth 2 (two) times daily.    Yes [provider]  losartan (COZAAR) 50 MG tablet Take 1 tablet (50 mg total) by mouth daily. 08/11/18  Yes Nahser, Wonda Cheng, MD  metoprolol succinate (TOPROL-XL) 50 MG 24 hr tablet Take 1 tablet (50 mg total) by mouth 2 (two) times a day. Take with or immediately following a meal. 09/06/18 09/06/19 Yes Weaver, Scott T, PA-C  Multiple Vitamin (MULTIVITAMIN) capsule Take 1 capsule by mouth daily.   Yes [provider]  omega-3 acid ethyl esters (LOVAZA) 1 G capsule Take 1 g by mouth daily.    Yes [provider]  omeprazole (PRILOSEC) 20 MG capsule Take 20 mg by mouth daily. 07/27/14  Yes [provider]    Current Facility-Administered Medications  Medication Dose Route Frequency Provider Last Rate Last Dose  . 0.9 %  sodium chloride infusion (Manually program via Guardrails IV Fluids)   Intravenous Once Doutova, Nyoka Lint, MD      . acetaminophen (TYLENOL) tablet 650 mg  650 mg Oral Q6H PRN Toy Baker, MD  Or  . acetaminophen (TYLENOL) suppository 650 mg  650 mg Rectal Q6H PRN Doutova, Anastassia, MD      . ALPRAZolam Duanne Moron) tablet 0.25 mg  0.25 mg Oral Q6H PRN Doutova, Anastassia, MD      . HYDROcodone-acetaminophen (NORCO/VICODIN) 5-325 MG per tablet 1-2 tablet  1-2 tablet Oral Q4H PRN Doutova, Anastassia, MD      . metoprolol succinate (TOPROL-XL) 24 hr tablet 50 mg  50 mg Oral BID Toy Baker, MD   50 mg at 09/09/18 0858  . ondansetron  (ZOFRAN) tablet 4 mg  4 mg Oral Q6H PRN Doutova, Anastassia, MD       Or  . ondansetron (ZOFRAN) injection 4 mg  4 mg Intravenous Q6H PRN Doutova, Anastassia, MD      . pantoprazole (PROTONIX) EC tablet 40 mg  40 mg Oral Daily Doutova, Anastassia, MD   40 mg at 09/09/18 0905  . sodium chloride flush (NS) 0.9 % injection 3 mL  3 mL Intravenous Q12H Doutova, Anastassia, MD      . sodium chloride flush (NS) 0.9 % injection 3 mL  3 mL Intravenous PRN Toy Baker, MD        Allergies as of 09/08/2018  . (No Known Allergies)    Family History  Problem Relation Age of Onset  . Breast cancer Mother   . Hypertension Father   . Diabetes Neg Hx   . Stroke Neg Hx   . CAD Neg Hx     Social History   Socioeconomic History  . Marital status: Widowed    Spouse name: Not on file  . Number of children: Not on file  . Years of education: Not on file  . Highest education level: Not on file  Occupational History  . Not on file  Social Needs  . Financial resource strain: Not on file  . Food insecurity:    Worry: Not on file    Inability: Not on file  . Transportation needs:    Medical: Not on file    Non-medical: Not on file  Tobacco Use  . Smoking status: Never Smoker  . Smokeless tobacco: Never Used  Substance and Sexual Activity  . Alcohol use: Yes    Alcohol/week: 0.0 standard drinks    Comment: occasional  . Drug use: No  . Sexual activity: Not on file  Lifestyle  . Physical activity:    Days per week: Not on file    Minutes per session: Not on file  . Stress: Not on file  Relationships  . Social connections:    Talks on phone: Not on file    Gets together: Not on file    Attends religious service: Not on file    Active member of club or organization: Not on file    Attends meetings of clubs or organizations: Not on file    Relationship status: Not on file  . Intimate partner violence:    Fear of current or ex partner: Not on file    Emotionally abused: Not on  file    Physically abused: Not on file    Forced sexual activity: Not on file  Other Topics Concern  . Not on file  Social History Narrative  . Not on file    Review of Systems: Generally negative.  No headaches, stomatitis, undue anxiety or depression, chest pain, lower extremity edema, GI symptoms (see HPI) urinary symptoms, skin rashes, or arthropathy.  He does admit to the above-mentioned dyspnea in association  with his anemia, as well as a chronic longstanding intermittent cough with some throat clearing (sounding to me a little bit like laryngopharyngeal reflux) which does not sound like it has changed in association with the recent symptoms over the past month.  Physical Exam: Vital signs in last 24 hours: Temp:  [97.9 F (36.6 C)-98.8 F (37.1 C)] 98.6 F (37 C) (04/25 0543) Pulse Rate:  [45-110] 110 (04/25 1329) Resp:  [13-27] 18 (04/25 1250) BP: (120-154)/(79-102) 122/83 (04/25 1329) SpO2:  [84 %-100 %] 98 % (04/25 1329) Last BM Date: 09/08/18 General:   Alert,  Well-developed, well-nourished, pleasant and cooperative in NAD Head:  Normocephalic and atraumatic. Eyes:  Sclera clear, no icterus.   Mouth:   No ulcerations or lesions.  Oropharynx pink & moist. Lungs:  Clear throughout to auscultation.   No wheezes, crackles, or rhonchi. No evident respiratory distress. Heart:   Irregular rhythm c/w a. fib; no murmurs, clicks, rubs,  or gallops. Abdomen:  Soft, nontender, and nondistended. No masses, hepatosplenomegaly or ventral hernias noted.  Rectal:   (not performed, but stool heme negative)   Msk:   Symmetrical without gross deformities. Extremities:   Without tibial edema. Neurologic:  Alert and coherent;  grossly normal neurologically. Skin:  Intact without significant lesions or rashes. Psych:   Alert and cooperative. Normal mood and affect.  Intake/Output from previous day: 04/24 0701 - 04/25 0700 In: 1020.8 [Blood:970.8; IV Piggyback:50] Out: -  Intake/Output  this shift: Total I/O In: 480 [P.O.:480] Out: -   Lab Results: Recent Labs    09/08/18 1556 09/09/18 0532  WBC 2.6* 2.8*  HGB 6.0* 7.4*  HCT 22.0* 25.2*  PLT 156 138*   BMET Recent Labs    09/08/18 1556 09/09/18 0532  NA 138 138  K 4.2 4.1  CL 109 109  CO2 23 23  GLUCOSE 106* 112*  BUN 32* 30*  CREATININE 1.31* 1.12  CALCIUM 8.5* 8.5*   LFT Recent Labs    09/09/18 0532  PROT 5.7*  ALBUMIN 3.7  AST 24  ALT 16  ALKPHOS 57  BILITOT 1.4*   PT/INR No results for input(s): LABPROT, INR in the last 72 hours.  Studies/Results: Ct Angio Chest Pe W And/or Wo Contrast  Result Date: 09/08/2018 CLINICAL DATA:  Shortness of breath EXAM: CT ANGIOGRAPHY CHEST WITH CONTRAST TECHNIQUE: Multidetector CT imaging of the chest was performed using the standard protocol during bolus administration of intravenous contrast. Multiplanar CT image reconstructions and MIPs were obtained to evaluate the vascular anatomy. CONTRAST:  179mL OMNIPAQUE IOHEXOL 350 MG/ML SOLN COMPARISON:  Chest x-ray earlier today. FINDINGS: Cardiovascular: Cardiomegaly. Aortic atherosclerosis. No filling defects in the pulmonary arteries to suggest pulmonary emboli. Mediastinum/Nodes: No enlarged mediastinal, hilar, or axillary lymph nodes. Thyroid gland, trachea, and esophagus demonstrate no significant findings. Lungs/Pleura: Dependent bibasilar atelectasis. No confluent opacities otherwise. No effusions. Upper Abdomen: Imaging into the upper abdomen shows no acute findings. Musculoskeletal: Chest wall soft tissues are unremarkable. No acute bony abnormality. Review of the MIP images confirms the above findings. IMPRESSION: No evidence of pulmonary embolus. Cardiomegaly. Dependent/bibasilar atelectasis. Aortic Atherosclerosis (ICD10-I70.0). Electronically Signed   By: Rolm Baptise M.D.   On: 09/08/2018 19:27   Dg Chest Port 1 View  Result Date: 09/08/2018 CLINICAL DATA:  Shortness of breath EXAM: PORTABLE CHEST 1  VIEW COMPARISON:  07/28/2016 FINDINGS: Cardiomegaly with small left pleural effusion, central vascular congestion and mild edema. Patchy atelectasis or minimal infiltrate at the left base. No pneumothorax. IMPRESSION: 1.  Cardiomegaly with vascular congestion, mild interstitial edema and small left effusion 2. Patchy atelectasis or minimal infiltrate at the left base Electronically Signed   By: Donavan Foil M.D.   On: 09/08/2018 16:13    Impression: Iron deficiency anemia without obvious explanation, either in terms of occult blood in the stool or in terms of localizing GI symptoms.    What is interesting is that his severe iron deficiency suggests chronic (longstanding) blood loss, whereas his symptoms are of more "subacute" onset.  The patient is on chronic PPI therapy which can cause iron deficiency by virtue of iron malabsorption, but this would not explain the relatively precipitous decline in his hemoglobin level over the past several months.  Plan: Given this patient's need and desire for resumption of anticoagulation, I think we need an updated evaluation of the GI tract to help guide management.  The purpose and risks of upper endoscopy and colonoscopy were reviewed with the patient and he is agreeable to proceed--they have been scheduled for tomorrow.    Furthermore, if those exams are unrevealing, we will be prepared to place a video capsule camera for evaluation of the small intestine to complete his GI work-up.  The purpose and risks of that procedure were explained as well.    The patient is aware that, in view of the [place] pandemic, he will possibly be getting intubated by anesthesia for his procedures.   LOS: 1 day   Youlanda Mighty Skyley Grandmaison  09/09/2018, 3:15 PM   Pager [phone] If no answer or after 5 PM call [phone]

## 2018-09-10 ENCOUNTER — Encounter (HOSPITAL_COMMUNITY): Payer: Self-pay | Admitting: Emergency Medicine

## 2018-09-10 ENCOUNTER — Inpatient Hospital Stay (HOSPITAL_COMMUNITY): Payer: Medicare Other | Admitting: Certified Registered"

## 2018-09-10 ENCOUNTER — Encounter (HOSPITAL_COMMUNITY)
Admission: EM | Disposition: A | Discharge status: Home / Self Care | Payer: Self-pay | Source: Home / Self Care | Attending: Internal Medicine

## 2018-09-10 ENCOUNTER — Other Ambulatory Visit: Payer: Self-pay

## 2018-09-10 DIAGNOSIS — D500 Iron deficiency anemia secondary to blood loss (chronic): Principal | ICD-10-CM

## 2018-09-10 HISTORY — PX: ESOPHAGOGASTRODUODENOSCOPY (EGD) WITH PROPOFOL: SHX5813

## 2018-09-10 HISTORY — PX: GIVENS CAPSULE STUDY: SHX5432

## 2018-09-10 HISTORY — PX: COLONOSCOPY WITH PROPOFOL: SHX5780

## 2018-09-10 LAB — CBC
HCT: 24.5 % — ABNORMAL LOW (ref 39.0–52.0)
Hemoglobin: 7.1 g/dL — ABNORMAL LOW (ref 13.0–17.0)
MCH: 22.8 pg — ABNORMAL LOW (ref 26.0–34.0)
MCHC: 29 g/dL — ABNORMAL LOW (ref 30.0–36.0)
MCV: 78.5 fL — ABNORMAL LOW (ref 80.0–100.0)
Platelets: 123 10*3/uL — ABNORMAL LOW (ref 150–400)
RBC: 3.12 MIL/uL — ABNORMAL LOW (ref 4.22–5.81)
RDW: 21.3 % — ABNORMAL HIGH (ref 11.5–15.5)
WBC: 2.6 10*3/uL — ABNORMAL LOW (ref 4.0–10.5)
nRBC: 0 % (ref 0.0–0.2)

## 2018-09-10 LAB — BASIC METABOLIC PANEL
Anion gap: 7 (ref 5–15)
BUN: 22 mg/dL (ref 8–23)
CO2: 21 mmol/L — ABNORMAL LOW (ref 22–32)
Calcium: 8.4 mg/dL — ABNORMAL LOW (ref 8.9–10.3)
Chloride: 110 mmol/L (ref 98–111)
Creatinine, Ser: 1.08 mg/dL (ref 0.61–1.24)
GFR calc Af Amer: >60 mL/min (ref >60)
GFR calc non Af Amer: >60 mL/min (ref >60)
Glucose, Bld: 96 mg/dL (ref 70–99)
Potassium: 4.1 mmol/L (ref 3.5–5.1)
Sodium: 138 mmol/L (ref 135–145)

## 2018-09-10 SURGERY — ESOPHAGOGASTRODUODENOSCOPY (EGD) WITH PROPOFOL
Anesthesia: General

## 2018-09-10 MED ORDER — EPHEDRINE SULFATE-NACL 50-0.9 MG/10ML-% IV SOSY
PREFILLED_SYRINGE | INTRAVENOUS | Status: DC | PRN
  Administered 2018-09-10 (×2): 10 mg via INTRAVENOUS

## 2018-09-10 MED ORDER — PROPOFOL 10 MG/ML IV BOLUS
INTRAVENOUS | Status: DC | PRN
  Administered 2018-09-10: 140 mg via INTRAVENOUS

## 2018-09-10 MED ORDER — SODIUM CHLORIDE 0.9 % IV SOLN: 250.0000 mL | INTRAVENOUS | Status: DC | PRN

## 2018-09-10 MED ORDER — LIDOCAINE 2% (20 MG/ML) 5 ML SYRINGE
INTRAMUSCULAR | Status: DC | PRN
  Administered 2018-09-10: 40 mg via INTRAVENOUS
  Administered 2018-09-10: 60 mg via INTRAVENOUS

## 2018-09-10 MED ORDER — SODIUM CHLORIDE 0.9 % IV SOLN: INTRAVENOUS | Status: DC

## 2018-09-10 MED ORDER — ONDANSETRON HCL 4 MG/2ML IJ SOLN
INTRAMUSCULAR | Status: DC | PRN
  Administered 2018-09-10: 4 mg via INTRAVENOUS

## 2018-09-10 MED ORDER — PHENYLEPHRINE 40 MCG/ML (10ML) SYRINGE FOR IV PUSH (FOR BLOOD PRESSURE SUPPORT)
PREFILLED_SYRINGE | INTRAVENOUS | Status: DC | PRN
  Administered 2018-09-10: 80 ug via INTRAVENOUS
  Administered 2018-09-10: 120 ug via INTRAVENOUS
  Administered 2018-09-10: 80 ug via INTRAVENOUS

## 2018-09-10 MED ORDER — PROPOFOL 10 MG/ML IV BOLUS
INTRAVENOUS | Status: AC
  Filled 2018-09-10: qty 60

## 2018-09-10 MED ORDER — FENTANYL CITRATE (PF) 250 MCG/5ML IJ SOLN: INTRAMUSCULAR | Status: DC | PRN

## 2018-09-10 MED ORDER — LACTATED RINGERS IV SOLN
INTRAVENOUS | Status: DC
  Administered 2018-09-10: 10:00:00 via INTRAVENOUS

## 2018-09-10 MED ORDER — FENTANYL CITRATE (PF) 100 MCG/2ML IJ SOLN
INTRAMUSCULAR | Status: AC
  Filled 2018-09-10: qty 2

## 2018-09-10 MED ORDER — SUCCINYLCHOLINE CHLORIDE 200 MG/10ML IV SOSY
PREFILLED_SYRINGE | INTRAVENOUS | Status: DC | PRN
  Administered 2018-09-10: 120 mg via INTRAVENOUS

## 2018-09-10 MED ORDER — FENTANYL CITRATE (PF) 100 MCG/2ML IJ SOLN
INTRAMUSCULAR | Status: DC | PRN
  Administered 2018-09-10 (×2): 25 ug via INTRAVENOUS

## 2018-09-10 SURGICAL SUPPLY — 25 items
BLOCK BITE 60FR ADLT L/F BLUE (MISCELLANEOUS) ×2 IMPLANT
ELECT REM PT RETURN 9FT ADLT (ELECTROSURGICAL)
ELECTRODE REM PT RTRN 9FT ADLT (ELECTROSURGICAL) IMPLANT
FCP BXJMBJMB 240X2.8X (CUTTING FORCEPS)
FLOOR PAD 36X40 (MISCELLANEOUS) ×2
FORCEP RJ3 GP 1.8X160 W-NEEDLE (CUTTING FORCEPS) IMPLANT
FORCEPS BIOP RAD 4 LRG CAP 4 (CUTTING FORCEPS) IMPLANT
FORCEPS BIOP RJ4 240 W/NDL (CUTTING FORCEPS)
FORCEPS BXJMBJMB 240X2.8X (CUTTING FORCEPS) IMPLANT
INJECTOR/SNARE I SNARE (MISCELLANEOUS) IMPLANT
LUBRICANT JELLY 4.5OZ STERILE (MISCELLANEOUS) IMPLANT
MANIFOLD NEPTUNE II (INSTRUMENTS) IMPLANT
NEEDLE SCLEROTHERAPY 25GX240 (NEEDLE) IMPLANT
PAD FLOOR 36X40 (MISCELLANEOUS) ×1 IMPLANT
PROBE APC STR FIRE (PROBE) IMPLANT
PROBE INJECTION GOLD (MISCELLANEOUS)
PROBE INJECTION GOLD 7FR (MISCELLANEOUS) IMPLANT
SNARE ROTATE MED OVAL 20MM (MISCELLANEOUS) IMPLANT
SNARE SHORT THROW 13M SML OVAL (MISCELLANEOUS) IMPLANT
SYR 50ML LL SCALE MARK (SYRINGE) IMPLANT
TOWEL COTTON PACK 4EA (MISCELLANEOUS) ×4 IMPLANT
TRAP SPECIMEN MUCOUS 40CC (MISCELLANEOUS) IMPLANT
TUBING ENDO SMARTCAP PENTAX (MISCELLANEOUS) ×4 IMPLANT
TUBING IRRIGATION ENDOGATOR (MISCELLANEOUS) ×2 IMPLANT
WATER STERILE IRR 1000ML POUR (IV SOLUTION) IMPLANT

## 2018-09-10 NOTE — Anesthesia Procedure Notes (Signed)
Date/Time: 09/10/2018 10:30 AM Performed by: Cynda Familia, CRNA Oxygen Delivery Method: Simple face mask Airway Equipment and Method: Oral airway Placement Confirmation: breath sounds checked- equal and bilateral and positive ETCO2

## 2018-09-10 NOTE — Anesthesia Preprocedure Evaluation (Addendum)
Anesthesia Evaluation  Patient identified by MRN, date of birth, ID band Patient awake    Reviewed: Allergy & Precautions, NPO status , Patient's Chart, lab work & pertinent test results  History of Anesthesia Complications Negative for: history of anesthetic complications  Airway Mallampati: II  TM Distance: >3 FB Neck ROM: Full    Dental  (+) Dental Advisory Given, Teeth Intact   Pulmonary neg pulmonary ROS,    breath sounds clear to auscultation       Cardiovascular hypertension, Pt. on home beta blockers and Pt. on medications + DOE  + dysrhythmias Atrial Fibrillation + Valvular Problems/Murmurs  Rhythm:Irregular Rate:Normal   '17 TTE - mild concentric LVH. EF 55% to 60%. Mild-mod MR. LA was moderately dilated. RA was moderately to severely dilated. Moderate TR. PASP was mildly increased, 41 mm Hg.     Neuro/Psych negative neurological ROS  negative psych ROS   GI/Hepatic Neg liver ROS, GERD  Medicated,  Endo/Other  negative endocrine ROS  Renal/GU  S/p nephrectomy (benign tumor per patient)      Musculoskeletal negative musculoskeletal ROS (+)   Abdominal   Peds  Hematology  (+) anemia ,  Pancytopenia     Anesthesia Other Findings   Reproductive/Obstetrics                            Anesthesia Physical Anesthesia Plan  ASA: III  Anesthesia Plan: General   Post-op Pain Management:    Induction: Intravenous and Rapid sequence  PONV Risk Score and Plan: 2 and Treatment may vary due to age or medical condition, Ondansetron and Propofol infusion  Airway Management Planned: Oral ETT  Additional Equipment: None  Intra-op Plan:   Post-operative Plan: Extubation in OR  Informed Consent: I have reviewed the patients History and Physical, chart, labs and discussed the procedure including the risks, benefits and alternatives for the proposed anesthesia with the patient or  authorized representative who has indicated his/her understanding and acceptance.     Dental advisory given  Plan Discussed with: CRNA, Anesthesiologist and Surgeon  Anesthesia Plan Comments:        Anesthesia Quick Evaluation

## 2018-09-10 NOTE — Transfer of Care (Signed)
Immediate Anesthesia Transfer of Care Note  Patient: Oluwatobi Visser Wilcoxson  Procedure(s) Performed: ESOPHAGOGASTRODUODENOSCOPY (EGD) WITH PROPOFOL (N/A ) COLONOSCOPY WITH PROPOFOL (N/A ) GIVENS CAPSULE STUDY (N/A )  Patient Location: PACU and Endoscopy Unit  Anesthesia Type:General  Level of Consciousness: sedated  Airway & Oxygen Therapy: Patient Spontanous Breathing and Patient connected to face mask oxygen  Post-op Assessment: Report given to RN and Post -op Vital signs reviewed and stable  Post vital signs: Reviewed and stable  Last Vitals:  Vitals Value Taken Time  BP    Temp    Pulse    Resp    SpO2      Last Pain:  Vitals:   09/10/18 0921  TempSrc: Oral  PainSc: 0-No pain         Complications: No apparent anesthesia complications

## 2018-09-10 NOTE — Op Note (Signed)
[facility] Patient Name: John Holder Procedure Date: 09/10/2018 MRN: [id number] Attending MD: Ronald Lobo , MD Date of Birth: Oct 09, 1931 CSN: [id number] Age: 85 Admit Type: Inpatient Procedure:                Upper GI endoscopy Indications:              Iron deficiency anemia. (Patient presents with 6 gm                            drop in hgb over past 3 mos, w/ heme neg stool, on                            PPI, on Eliquis, no GI sx, no evidence of                            hemolysis). Providers:                Ronald Lobo, MD, Burtis Junes, RN, Glori Bickers,                            RN, Laverda Sorenson, Technician, Glenis Smoker, CRNA Referring MD:              Medicines:                General Anesthesia--because of Covid epidemic. Complications:            No immediate complications. Estimated Blood Loss:     Estimated blood loss: none. Procedure:                Pre-Anesthesia Assessment:                           - Prior to the procedure, a History and Physical                            was performed, and patient medications and                            allergies were reviewed. The patient's tolerance of                            previous anesthesia was also reviewed. The risks                            and benefits of the procedure and the sedation                            options and risks were discussed with the patient.                            All questions were answered, and informed consent                            was obtained. Prior Anticoagulants: The patient has  taken Eliquis (apixaban), last dose was 3 days                            prior to procedure. ASA Grade Assessment: III - A                            patient with severe systemic disease. After                            reviewing the risks and benefits, the patient was                            deemed in satisfactory condition to undergo the                     procedure.                           After obtaining informed consent, the endoscope was                            passed under direct vision. Throughout the                            procedure, the patient's blood pressure, pulse, and                            oxygen saturations were monitored continuously. The                            [id number]-[id number] ([id number] Olympus gastroscope was                            introduced through the mouth, and advanced to the                            second part of duodenum. The upper GI endoscopy was                            accomplished without difficulty. The patient                            tolerated the procedure well. Scope In: Scope Out: Findings:      The examined esophagus was normal.      There is no endoscopic evidence of Barrett's esophagus, esophagitis,       hiatal hernia or Mallory-Weiss tear in the entire esophagus.      The entire examined stomach was normal.      The cardia and gastric fundus were normal on retroflexion.      There is no endoscopic evidence of inflammation, mucosal abnormalities,       ulceration, mass, erosion or tumor in the entire examined stomach.      The examined duodenum was normal including 4-quadrant inspection of the       bulb.      All areas were reinspected without additional findings. Impression:               -  Normal esophagus.                           - Normal stomach.                           - Normal examined duodenum.                           - No specimens collected.                           - No source of anemia evident on current exam. Moderate Sedation:      This patient was sedated with general anesthesia, not moderate sedation. Recommendation:           - Continue present medications.                           - Perform a colonoscopy today. Procedure Code(s):        --- Professional ---                           [phone], Esophagogastroduodenoscopy, flexible,                             transoral; diagnostic, including collection of                            specimen(s) by brushing or washing, when performed                            (separate procedure) Diagnosis Code(s):        --- Professional ---                           D50.9, Iron deficiency anemia, unspecified CPT copyright 2019 American Medical Association. All rights reserved. The codes documented in this report are preliminary and upon coder review may  be revised to meet current compliance requirements. Ronald Lobo, MD 09/10/2018 10:45:28 AM This report has been signed electronically. Number of Addenda: 0

## 2018-09-10 NOTE — Progress Notes (Signed)
Capsule camera was removed at 1925 this evening and placed in a pt belongings bag on the chair in pt's room. Called to get it retrieved but they said endo was closed and to call in AM to get it picked up.

## 2018-09-10 NOTE — Interval H&P Note (Signed)
History and Physical Interval Note:  09/10/2018 9:30 AM  John Holder  has presented today for surgery, with the diagnosis of iron deficiency anemia.  The various methods of treatment have been discussed with the patient. After consideration of risks, benefits and other options for treatment, the patient has consented to  Procedure(s): ESOPHAGOGASTRODUODENOSCOPY (EGD) WITH PROPOFOL (N/A) COLONOSCOPY WITH PROPOFOL (N/A) GIVENS CAPSULE STUDY (N/A) as a surgical intervention.  The patient's history has been reviewed, patient examined, no change in status, stable for surgery.  I have reviewed the patient's chart and labs.  Questions were answered to the patient's satisfaction.     [name]

## 2018-09-10 NOTE — Anesthesia Procedure Notes (Signed)
Procedure Name: Intubation Date/Time: 09/10/2018 9:43 AM Performed by: Cynda Familia, CRNA Pre-anesthesia Checklist: Patient identified, Emergency Drugs available, Suction available and Patient being monitored Patient Re-evaluated:Patient Re-evaluated prior to induction Oxygen Delivery Method: Circle System Utilized Preoxygenation: Pre-oxygenation with 100% oxygen Induction Type: IV induction Ventilation: Mask ventilation without difficulty Laryngoscope Size: Miller and 2 Grade View: Grade I Tube type: Oral Tube size: 7.5 mm Number of attempts: 1 Airway Equipment and Method: Stylet and Oral airway Placement Confirmation: ETT inserted through vocal cords under direct vision,  positive ETCO2 and breath sounds checked- equal and bilateral Secured at: 22 cm Tube secured with: Tape Dental Injury: Teeth and Oropharynx as per pre-operative assessment  Comments: Smooth IV induction Brock-- intubation AM CRNA atraumatic- teeth and mouth as preop-- bilat BS Fransisco Beau

## 2018-09-10 NOTE — Op Note (Signed)
[facility] Patient Name: John Holder Procedure Date: 09/10/2018 MRN: [id number] Attending MD: Ronald Lobo , MD Date of Birth: Oct 02, 1931 CSN: [id number] Age: 85 Admit Type: Inpatient Procedure:                Colonoscopy Indications:              Iron deficiency anemia (Patient presents with 6 gm                            drop in hgb over past 3 mos, w/ heme neg stool, on                            PPI, on Eliquis, no GI sx, no evidence of                            hemolysis). EGD negative. Providers:                Ronald Lobo, MD, Burtis Junes, RN, Glori Bickers,                            RN, Laverda Sorenson, Technician, Glenis Smoker, CRNA Referring MD:              Medicines:                General Anesthesia Complications:            No immediate complications. Estimated Blood Loss:     Estimated blood loss: none. Procedure:                Pre-Anesthesia Assessment:                           - Prior to the procedure, a History and Physical                            was performed, and patient medications and                            allergies were reviewed. The patient's tolerance of                            previous anesthesia was also reviewed. The risks                            and benefits of the procedure and the sedation                            options and risks were discussed with the patient.                            All questions were answered, and informed consent                            was obtained. Prior Anticoagulants: The patient has  taken Eliquis (apixaban), last dose was 3 days                            prior to procedure. ASA Grade Assessment: III - A                            patient with severe systemic disease. After                            reviewing the risks and benefits, the patient was                            deemed in satisfactory condition to undergo the   procedure.                           After obtaining informed consent, the colonoscope                            was passed under direct vision. Throughout the                            procedure, the patient's blood pressure, pulse, and                            oxygen saturations were monitored continuously. The                            CF-HQ190L ([id number]) Olympus colonoscope was                            introduced through the anus and advanced to the the                            cecum, identified by appendiceal orifice and                            ileocecal valve. The colonoscopy was performed                            without difficulty. The patient tolerated the                            procedure well. The quality of the bowel                            preparation was excellent. Scope In: 10:00:10 AM Scope Out: 10:24:09 AM Scope Withdrawal Time: 0 hours 16 minutes 51 seconds  Total Procedure Duration: 0 hours 23 minutes 59 seconds  Findings:      The perianal and digital rectal examinations were normal. Pertinent       negatives include normal prostate (size, shape, and consistency).      A single medium-mouthed diverticulum was found in the sigmoid colon.      No other significant abnormalities were identified in a careful  examination of the remainder of the colon. The right colon was       reinspected. The scoped was nubbed into the orifice of the IC valve and       the TI mucosa was seen to a limited degree and appeared normal, but the       TI was not freely intubated.      There is no endoscopic evidence of inflammation, mass, polyps or       angiodysplasia in the entire colon.      The retroflexed view of the distal rectum and anal verge was normal and       showed no anal or rectal abnormalities. Impression:               - Minimal diverticulosis in the sigmoid colon.                           - The distal rectum and anal verge are normal on                             retroflexion view.                           - No specimens collected.                           - No source of anemia seen on current exam. Moderate Sedation:      This patient was sedated with general anesthesia, not moderate sedation. Recommendation:           - To visualize the small bowel, perform video                            capsule endoscopy today. Procedure Code(s):        --- Professional ---                           [phone], Colonoscopy, flexible; diagnostic, including                            collection of specimen(s) by brushing or washing,                            when performed (separate procedure) Diagnosis Code(s):        --- Professional ---                           D50.9, Iron deficiency anemia, unspecified CPT copyright 2019 American Medical Association. All rights reserved. The codes documented in this report are preliminary and upon coder review may  be revised to meet current compliance requirements. Ronald Lobo, MD 09/10/2018 10:50:49 AM This report has been signed electronically. Number of Addenda: 0

## 2018-09-10 NOTE — Progress Notes (Signed)
[facility]                                                                              Patient Demographics  John Holder, is a 85 y.o. male, DOB - 22-Aug-1931, RSW:[id number]  Admit date - 09/08/2018   Admitting Physician John Baker, MD  Outpatient Primary MD for the patient is John Bunting, MD  Outpatient specialists:   LOS - 2  days   Medical records reviewed and are as summarized below:    Chief Complaint  Patient presents with  . Shortness of Breath       Brief summary   Patient is a 85 year old male with hypertension, atrial fibrillation on eliquis, hyperlipidemia, history of nephrectomy for renal mass in the past.  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  At baseline he is very functionally active (used to play competitive tennis).  He noticed fatigue and generalized weakness.  Patient had a tele-visit with his cardiologist on 4/16 and metoprolol was increased.  Denied any fevers or chills, reported weight loss and swelling in his ankles.  Patient had another tele-visit on 4/22, he had labs done which showed hemoglobin of 6.0.  Patient otherwise denied any frank bleeding, no hematemesis or hematochezia or melena.   Assessment & Plan    Principal Problem: Acute microcytic symptomatic anemia secondary to iron deficiency -Patient presented with hemoglobin of 6.0, last hemoglobin 13.8 in 04/2018 -initially suspected hematological disorder hence hematology oncology was consulted, patient was seen by Dr. Jana Holder. -Anemia panel showed severe iron deficiency anemia, per hematology, no evidence of hemolysis, leuko-erythroblastic or leukemic findings.  No coagulopathy or significant renal insufficiency, does not need any bone marrow biopsy or hematological work-up. -FOBT is negative, hemoglobin 7.1 after 1 unit packed RBCs and  Feraheme x1 -Patient is also on Eliquis for atrial fibrillation.  GI consulted, underwent EGD and colonoscopy today.  EGD normal, colonoscopy showed minimal diverticulosis otherwise no source of anemia or iron deficiency identified.  - Video small bowel capsule endoscopy initiated -diet per GI  Active Problems:   Hypoxia: Likely due to severe symptomatic anemia -CTA negative for PE, COVID negative -Chest x-ray had shown patchy atelectasis or minimal infiltrates at the left base, patient was placed on Zithromax and Rocephin -No fevers or worsening cough.  Blood cultures negative so far    Atrial fibrillation, chronic -Continue beta-blocker for rate control, -Hold Eliquis until GI work-up completed and cleared by GI    HTN (hypertension) BP currently stable, continue Toprol-XL  Code Status: Full code DVT Prophylaxis:  SCD's, Eliquis on hold Family Communication: Discussed in detail with the patient, all imaging results, lab results explained to the patient    Disposition Plan: Likely DC home tomorrow if cleared by GI  Time Spent in minutes   25 minutes  Procedures:  CT angiogram of chest EGD: Normal esophagus, stomach, duodenum Colonoscopy: Minimal diverticulosis otherwise normal  Consultants:   Hematology oncology GI  Antimicrobials:   Anti-infectives (From admission, onward)   Start  Dose/Rate Route Frequency Ordered Stop   09/08/18 1645  cefTRIAXone (ROCEPHIN) 1 g in sodium chloride 0.9 % 100 mL IVPB     1 g 200 mL/hr over 30 Minutes Intravenous  Once 09/08/18 1633 09/08/18 1811   09/08/18 1645  azithromycin (ZITHROMAX) tablet 500 mg     500 mg Oral  Once 09/08/18 1633 09/08/18 1702         Medications  Scheduled Meds: . [MAR Hold] sodium chloride   Intravenous Once  . [MAR Hold] metoprolol succinate  50 mg Oral BID  . [MAR Hold] pantoprazole  40 mg Oral Daily  . [MAR Hold] sodium chloride flush  3 mL Intravenous Q12H   Continuous Infusions: . [MAR  Hold] sodium chloride    . lactated ringers 10 mL/hr at 09/10/18 0931   PRN Meds:.[MAR Hold] sodium chloride, [MAR Hold] acetaminophen **OR** [MAR Hold] acetaminophen, [MAR Hold] ALPRAZolam, [MAR Hold] HYDROcodone-acetaminophen, [MAR Hold] ondansetron **OR** [MAR Hold] ondansetron (ZOFRAN) IV, [MAR Hold] sodium chloride flush      Subjective:   John Holder was seen and examined today.  Patient seen before endoscopy, denied any nausea vomiting, hematemesis, hematochezia or melena.  No abdominal pain.  No acute issues.   Patient denies chest pain, shortness of breath, numbess, tingling.   Objective:   Vitals:   09/09/18 2101 09/10/18 0540 09/10/18 0921 09/10/18 1043  BP: (!) 153/90 125/83 (!) 147/80 130/78  Pulse: 99 74 83 (!) 107  Resp: (!) '22 18 18 10  ' Temp: 98.3 F (36.8 C) 98.3 F (36.8 C) 98.4 F (36.9 C) (!) 97.4 F (36.3 C)  TempSrc: Oral Oral Oral Axillary  SpO2: 98% 96% 96% 96%  Weight:   74.8 kg   Height:   '5\' 11"'  (1.803 m)     Intake/Output Summary (Last 24 hours) at 09/10/2018 1104 Last data filed at 09/10/2018 1036 Gross per 24 hour  Intake 1280 ml  Output 0 ml  Net 1280 ml     Wt Readings from Last 3 Encounters:  09/10/18 74.8 kg  09/06/18 74.8 kg  05/26/17 80.7 kg    Physical Exam  General: Alert and oriented x 3, NAD  Eyes: Pale conjunctiva  HEENT:    Cardiovascular: S1 S2 clear, RRR. No pedal edema b/l  Respiratory: CTAB, no wheezing, rales or rhonchi  Gastrointestinal: Soft, nontender, nondistended, NBS  Ext: no pedal edema bilaterally  Neuro: no new deficits  Musculoskeletal: No cyanosis, clubbing  Skin: No rashes  Psych: Normal affect and demeanor, alert and oriented x3    Data Reviewed:  I have personally reviewed following labs and imaging studies  Micro Results Recent Results (from the past 240 hour(s))  SARS Coronavirus 2 [facility] order, Performed in [place] hospital lab)     Status: None   Collection Time: 09/08/18   3:47 PM  Result Value Ref Range Status   SARS Coronavirus 2 NEGATIVE NEGATIVE Final    Comment: (NOTE) If result is NEGATIVE SARS-CoV-2 target nucleic acids are NOT DETECTED. The SARS-CoV-2 RNA is generally detectable in upper and lower  respiratory specimens during the acute phase of infection. The lowest  concentration of SARS-CoV-2 viral copies this assay can detect is 250  copies / mL. A negative result does not preclude SARS-CoV-2 infection  and should not be used as the sole basis for treatment or other  patient management decisions.  A negative result may occur with  improper specimen collection / handling, submission of specimen other  than nasopharyngeal swab,  presence of viral mutation(s) within the  areas targeted by this assay, and inadequate number of viral copies  (<250 copies / mL). A negative result must be combined with clinical  observations, patient history, and epidemiological information. If result is POSITIVE SARS-CoV-2 target nucleic acids are DETECTED. The SARS-CoV-2 RNA is generally detectable in upper and lower  respiratory specimens dur ing the acute phase of infection.  Positive  results are indicative of active infection with SARS-CoV-2.  Clinical  correlation with patient history and other diagnostic information is  necessary to determine patient infection status.  Positive results do  not rule out bacterial infection or co-infection with other viruses. If result is PRESUMPTIVE POSTIVE SARS-CoV-2 nucleic acids MAY BE PRESENT.   A presumptive positive result was obtained on the submitted specimen  and confirmed on repeat testing.  While 2019 novel coronavirus  (SARS-CoV-2) nucleic acids may be present in the submitted sample  additional confirmatory testing may be necessary for epidemiological  and / or clinical management purposes  to differentiate between  SARS-CoV-2 and other Sarbecovirus currently known to infect humans.  If clinically indicated  additional testing with an alternate test  methodology [phone]) is advised. The SARS-CoV-2 RNA is generally  detectable in upper and lower respiratory sp ecimens during the acute  phase of infection. The expected result is Negative. Fact Sheet for Patients:  StrictlyIdeas.no Fact Sheet for Healthcare Providers: BankingDealers.co.za This test is not yet approved or cleared by the [place] FDA and has been authorized for detection and/or diagnosis of SARS-CoV-2 by FDA under an Emergency Use Authorization (EUA).  This EUA will remain in effect (meaning this test can be used) for the duration of the COVID-19 declaration under Section 564(b)(1) of the Act, 21 U.S.C. section 360bbb-3(b)(1), unless the authorization is terminated or revoked sooner. Performed at [facility], [place] [address] [place], [place] [address]   Blood Culture (routine x 2)     Status: None (Preliminary result)   Collection Time: 09/08/18  3:47 PM  Result Value Ref Range Status   Specimen Description   Final    BLOOD LEFT FOREARM Performed at [facility], [place] [address], [place], [place] [address]    Special Requests   Final    BOTTLES DRAWN AEROBIC AND ANAEROBIC Blood Culture adequate volume Performed at [place] [address], [place], [place] [address]    Culture   Final    NO GROWTH 2 DAYS Performed at [place] [address] [place], [place] [address]    Report Status PENDING  Incomplete  Blood Culture (routine x 2)     Status: None (Preliminary result)   Collection Time: 09/08/18  3:52 PM  Result Value Ref Range Status   Specimen Description   Final    BLOOD LEFT ANTECUBITAL Performed at [place] [address] [place], [place] [address]    Special Requests   Final    BOTTLES DRAWN AEROBIC AND ANAEROBIC Blood Culture results may not be optimal due to an inadequate  volume of blood received in culture bottles Performed at [place] [address], [place], [place] [address]    Culture   Final    NO GROWTH 2 DAYS Performed at [place] [address], [place], [place] [address]    Report Status PENDING  Incomplete    Radiology Reports Ct Angio Chest Pe W And/or Wo Contrast  Result Date:  09/08/2018 CLINICAL DATA:  Shortness of breath EXAM: CT ANGIOGRAPHY CHEST WITH CONTRAST TECHNIQUE: Multidetector CT imaging of the chest was performed using the standard protocol during bolus administration of intravenous contrast. Multiplanar CT image reconstructions and MIPs were obtained to evaluate the vascular anatomy. CONTRAST:  145m OMNIPAQUE IOHEXOL 350 MG/ML SOLN COMPARISON:  Chest x-ray earlier today. FINDINGS: Cardiovascular: Cardiomegaly. Aortic atherosclerosis. No filling defects in the pulmonary arteries to suggest pulmonary emboli. Mediastinum/Nodes: No enlarged mediastinal, hilar, or axillary lymph nodes. Thyroid gland, trachea, and esophagus demonstrate no significant findings. Lungs/Pleura: Dependent bibasilar atelectasis. No confluent opacities otherwise. No effusions. Upper Abdomen: Imaging into the upper abdomen shows no acute findings. Musculoskeletal: Chest wall soft tissues are unremarkable. No acute bony abnormality. Review of the MIP images confirms the above findings. IMPRESSION: No evidence of pulmonary embolus. Cardiomegaly. Dependent/bibasilar atelectasis. Aortic Atherosclerosis (ICD10-I70.0). Electronically Signed   By: KRolm BaptiseM.D.   On: 09/08/2018 19:27   Dg Chest Port 1 View  Result Date: 09/08/2018 CLINICAL DATA:  Shortness of breath EXAM: PORTABLE CHEST 1 VIEW COMPARISON:  07/28/2016 FINDINGS: Cardiomegaly with small left pleural effusion, central vascular congestion and mild edema. Patchy atelectasis or minimal infiltrate at the left base. No pneumothorax. IMPRESSION: 1. Cardiomegaly with vascular  congestion, mild interstitial edema and small left effusion 2. Patchy atelectasis or minimal infiltrate at the left base Electronically Signed   By: KDonavan FoilM.D.   On: 09/08/2018 16:13    Lab Data:  CBC: Recent Labs  Lab 09/08/18 1556 09/09/18 0532 09/10/18 0548  WBC 2.6* 2.8* 2.6*  NEUTROABS 1.2* 1.4*  --   HGB 6.0* 7.4* 7.1*  HCT 22.0* 25.2* 24.5*  MCV 76.7* 78.3* 78.5*  PLT 156 138* 1093   Basic Metabolic Panel: Recent Labs  Lab 09/08/18 1556 09/09/18 0532 09/10/18 0548  NA 138 138 138  K 4.2 4.1 4.1  CL 109 109 110  CO2 23 23 21*  GLUCOSE 106* 112* 96  BUN 32* 30* 22  CREATININE 1.31* 1.12 1.08  CALCIUM 8.5* 8.5* 8.4*  MG  --  2.4  --   PHOS  --  4.0  --    GFR: Estimated Creatinine Clearance: 53.9 mL/min (by C-G formula based on SCr of 1.08 mg/dL). Liver Function Tests: Recent Labs  Lab 09/08/18 1556 09/09/18 0532  AST 29 24  ALT 18 16  ALKPHOS 62 57  BILITOT 1.1 1.4*  PROT 6.3* 5.7*  ALBUMIN 4.1 3.7   No results for input(s): LIPASE, AMYLASE in the last 168 hours. No results for input(s): AMMONIA in the last 168 hours. Coagulation Profile: No results for input(s): INR, PROTIME in the last 168 hours. Cardiac Enzymes: Recent Labs  Lab 09/08/18 1556  TROPONINI <0.03   BNP (last 3 results) No results for input(s): PROBNP in the last 8760 hours. HbA1C: No results for input(s): HGBA1C in the last 72 hours. CBG: No results for input(s): GLUCAP in the last 168 hours. Lipid Profile: Recent Labs    09/08/18 1556  TRIG 31   Thyroid Function Tests: Recent Labs    09/09/18 0532  TSH 2.531   Anemia Panel: Recent Labs    09/08/18 1556 09/09/18 0532  VITAMINB12 325  --   FOLATE 29.8  --   FERRITIN 7*  --   TIBC 420  --   IRON 7*  --   RETICCTPCT  --  1.2   Urine analysis:    Component Value Date/Time   COLORURINE YELLOW 09/09/2018 1050  APPEARANCEUR CLEAR 09/09/2018 1050   LABSPEC 1.036 (H) 09/09/2018 1050   PHURINE 5.0  09/09/2018 1050   GLUCOSEU NEGATIVE 09/09/2018 1050   HGBUR NEGATIVE 09/09/2018 [place] 09/09/2018 1050   KETONESUR NEGATIVE 09/09/2018 1050   PROTEINUR NEGATIVE 09/09/2018 1050   NITRITE NEGATIVE 09/09/2018 [place] 09/09/2018 [place] M.D. [facility] 09/10/2018, 11:04 AM  Pager: [phone] Between 7am to 7pm - call Pager - [phone]  After 7pm go to [email or web address] - password [email or web address]  Call night coverage person covering after 7pm

## 2018-09-10 NOTE — Progress Notes (Signed)
Endoscopy and colonoscopy unrevealing for source of anemia or iron deficiency.  Small bowel capsule endoscopy has been initiated.  Hopefully, that study can be read by tomorrow afternoon.  Cleotis Nipper, M.D. Pager [phone] If no answer or after 5 PM call [phone]

## 2018-09-10 NOTE — Progress Notes (Signed)
Patient feels well this morning.    Hemoglobin following administration of 2 units of packed cells went up from 6.0-7.1.  This rise is somewhat less than I would have anticipated but in the absence of evidence of bleeding or hemolysis, I anticipate we will not see a further drop.  We will proceed with endoscopy and colonoscopy as planned, with possible subsequent video capsule placement, if those tests are unrevealing.  Cleotis Nipper, M.D. Pager [phone] If no answer or after 5 PM call [phone]

## 2018-09-10 NOTE — Anesthesia Postprocedure Evaluation (Signed)
Anesthesia Post Note  Patient: John Holder  Procedure(s) Performed: ESOPHAGOGASTRODUODENOSCOPY (EGD) WITH PROPOFOL (N/A ) COLONOSCOPY WITH PROPOFOL (N/A ) GIVENS CAPSULE STUDY (N/A )     Patient location during evaluation: PACU Anesthesia Type: General Level of consciousness: awake and alert Pain management: pain level controlled Vital Signs Assessment: post-procedure vital signs reviewed and stable Respiratory status: spontaneous breathing, nonlabored ventilation and respiratory function stable Cardiovascular status: blood pressure returned to baseline and stable Postop Assessment: no apparent nausea or vomiting Anesthetic complications: no    Last Vitals:  Vitals:   09/10/18 0921 09/10/18 1043  BP: (!) 147/80 130/78  Pulse: 83 (!) 107  Resp: 18 10  Temp: 36.9 C (!) 36.3 C  SpO2: 96% 96%    Last Pain:  Vitals:   09/10/18 1043  TempSrc: Axillary  PainSc:                  [name]

## 2018-09-11 ENCOUNTER — Encounter (HOSPITAL_COMMUNITY): Payer: Self-pay | Admitting: Gastroenterology

## 2018-09-11 ENCOUNTER — Other Ambulatory Visit: Payer: Medicare Other

## 2018-09-11 LAB — CBC
HCT: 25.8 % — ABNORMAL LOW (ref 39.0–52.0)
Hemoglobin: 7.3 g/dL — ABNORMAL LOW (ref 13.0–17.0)
MCH: 22.9 pg — ABNORMAL LOW (ref 26.0–34.0)
MCHC: 28.3 g/dL — ABNORMAL LOW (ref 30.0–36.0)
MCV: 80.9 fL (ref 80.0–100.0)
Platelets: 123 10*3/uL — ABNORMAL LOW (ref 150–400)
RBC: 3.19 MIL/uL — ABNORMAL LOW (ref 4.22–5.81)
RDW: 21.8 % — ABNORMAL HIGH (ref 11.5–15.5)
WBC: 2.8 10*3/uL — ABNORMAL LOW (ref 4.0–10.5)
nRBC: 0.7 % — ABNORMAL HIGH (ref 0.0–0.2)

## 2018-09-11 LAB — BASIC METABOLIC PANEL
Anion gap: 7 (ref 5–15)
BUN: 21 mg/dL (ref 8–23)
CO2: 21 mmol/L — ABNORMAL LOW (ref 22–32)
Calcium: 8.4 mg/dL — ABNORMAL LOW (ref 8.9–10.3)
Chloride: 109 mmol/L (ref 98–111)
Creatinine, Ser: 1.32 mg/dL — ABNORMAL HIGH (ref 0.61–1.24)
GFR calc Af Amer: 57 mL/min — ABNORMAL LOW (ref >60)
GFR calc non Af Amer: 49 mL/min — ABNORMAL LOW (ref >60)
Glucose, Bld: 100 mg/dL — ABNORMAL HIGH (ref 70–99)
Potassium: 4.5 mmol/L (ref 3.5–5.1)
Sodium: 137 mmol/L (ref 135–145)

## 2018-09-11 LAB — PROTEIN ELECTROPHORESIS, SERUM
A/G Ratio: 1.9 — ABNORMAL HIGH (ref 0.7–1.7)
Albumin ELP: 3.4 g/dL (ref 2.9–4.4)
Alpha-1-Globulin: 0.2 g/dL (ref 0.0–0.4)
Alpha-2-Globulin: 0.5 g/dL (ref 0.4–1.0)
Beta Globulin: 0.6 g/dL — ABNORMAL LOW (ref 0.7–1.3)
Gamma Globulin: 0.5 g/dL (ref 0.4–1.8)
Globulin, Total: 1.8 g/dL — ABNORMAL LOW (ref 2.2–3.9)
Total Protein ELP: 5.2 g/dL — ABNORMAL LOW (ref 6.0–8.5)

## 2018-09-11 MED ORDER — SODIUM CHLORIDE 0.9 % IV SOLN
INTRAVENOUS | Status: DC
  Administered 2018-09-11: 11:00:00 via INTRAVENOUS

## 2018-09-11 MED ORDER — HYDROCORTISONE 1 % EX CREA
1.0000 "application " | TOPICAL_CREAM | Freq: Two times a day (BID) | CUTANEOUS | Status: DC
  Administered 2018-09-11 – 2018-09-12 (×3): 1 via TOPICAL
  Filled 2018-09-11: qty 28

## 2018-09-11 NOTE — Progress Notes (Signed)
Physical Therapy Treatment Patient Details Name: John Holder MRN: [id number] DOB: [birth year] Today's Date: 09/11/2018    History of Present Illness 85 year old male with hypertension, atrial fibrillation on eliquis, hyperlipidemia, history of nephrectomy for renal mass in the past.  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  At baseline he is very functionally active (used to play competitive tennis).  He noticed fatigue and generalized weakness.  Dx of anemia.     PT Comments    Pt OOB in recliner.  Moves well however present with decreased RA sats with activity and Mod c/o dyspnea in which pt needs to cease activity.  RA at rest 96% then decreased to 83% with gait with 2/4 dyspnea associated with mild dizziness.    Follow Up Recommendations  No PT follow up     Equipment Recommendations  None recommended by PT    Recommendations for Other Services       Precautions / Restrictions Precautions Precaution Comments: monitor O2; denies falls in past 1 year Restrictions Weight Bearing Restrictions: No    Mobility  Bed Mobility               General bed mobility comments: OOB in recliner   Transfers Overall transfer level: Needs assistance   Transfers: Sit to/from Stand Sit to Stand: Supervision         General transfer comment: good use of hands to steady self   Ambulation/Gait Ambulation/Gait assistance: Supervision Gait Distance (Feet): 185 Feet Assistive device: None Gait Pattern/deviations: Step-through pattern Gait velocity: WFL   General Gait Details: steady without AD, SaO2 83% on room air walking, mild c/o dizziness and Mod c/o dyspnea.  Standing rest break required.  quickly rebounds to 99% on room air within 1 minute of rest   Stairs             Wheelchair Mobility    Modified Rankin (Stroke Patients Only)        Balance                                            Cognition   Behavior During Therapy: WFL for tasks assessed/performed Overall Cognitive Status: Within Functional Limits for tasks assessed                                        Exercises      General Comments        Pertinent Vitals/Pain Pain Assessment: 0-10    Home Living                      Prior Function            PT Goals (current goals can now be found in the care plan section) Progress towards PT goals: Progressing toward goals    Frequency    Min 3X/week      PT Plan      Co-evaluation              AM-PAC PT "6 Clicks" Mobility   Outcome Measure  Help needed turning from your back to your side while in a flat bed without using bedrails?:  None Help needed moving from lying on your back to sitting on the side of a flat bed without using bedrails?: None Help needed moving to and from a bed to a chair (including a wheelchair)?: None Help needed standing up from a chair using your arms (e.g., wheelchair or bedside chair)?: None Help needed to walk in hospital room?: A Little Help needed climbing 3-5 steps with a railing? : A Little 6 Click Score: 22    End of Session Equipment Utilized During Treatment: Gait belt Activity Tolerance: Patient limited by fatigue;Other (comment)(dyspnea) Patient left: in chair;with call bell/phone within reach Nurse Communication: Mobility status;Other (comment) PT Visit Diagnosis: Difficulty in walking, not elsewhere classified (R26.2)     Time: 1100-1130 PT Time Calculation (min) (ACUTE ONLY): 30 min  Charges:  $Gait Training: 8-22 mins $Therapeutic Activity: 8-22 mins                     Rica Koyanagi  PTA Acute  Rehabilitation Services Pager      [phone] Office      [phone]

## 2018-09-11 NOTE — Progress Notes (Addendum)
[facility]                                                                              Patient Demographics  John Holder, is a 85 y.o. male, DOB - 01-31-1932, OZD:[id number]  Admit date - 09/08/2018   Admitting Physician Toy Baker, MD  Outpatient Primary MD for the patient is Burnard Bunting, MD  Outpatient specialists:   LOS - 3  days   Medical records reviewed and are as summarized below:    Chief Complaint  Patient presents with   Shortness of Breath       Brief summary   Patient is a 85 year old male with hypertension, atrial fibrillation on eliquis, hyperlipidemia, history of nephrectomy for renal mass in the past.  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  At baseline he is very functionally active (used to play competitive tennis).  He noticed fatigue and generalized weakness.  Patient had a tele-visit with his cardiologist on 4/16 and metoprolol was increased.  Denied any fevers or chills, reported weight loss and swelling in his ankles.  Patient had another tele-visit on 4/22, he had labs done which showed hemoglobin of 6.0.  Patient otherwise denied any frank bleeding, no hematemesis or hematochezia or melena.   Assessment & Plan    Principal Problem: Acute microcytic symptomatic anemia secondary to iron deficiency -Patient presented with hemoglobin of 6.0, last hemoglobin 13.8 in 04/2018 -initially suspected hematological disorder hence hematology oncology was consulted, patient was seen by Dr. Jana Hakim.  Patient received 1 unit of packed RBC and Feraheme x1 -Anemia panel showed severe iron deficiency anemia, per hematology, no evidence of hemolysis, leuko-erythroblastic or leukemic findings.  No coagulopathy or significant renal insufficiency, does not need any bone marrow biopsy or hematological work-up. -Patient is  also on Eliquis for atrial fibrillation, currently on hold.  GI consulted, underwent EGD and colonoscopy today.  EGD normal, colonoscopy showed minimal diverticulosis otherwise no source of anemia or iron deficiency identified.  - Video small bowel capsule endoscopy initiated on 4/26, results still pending -Hemoglobin currently stable at 7.3 -If capsule endoscopy normal, need clearance from GI regarding anticoagulation  Addendum 5;35pm Spoke with GI, Dr. Michail Sermon, capsule endoscopy positive with abnormal finding in the jejunum -Patient needs a CT enterography - Creatinine today 1.3, will place on IV fluid hydration, repeat BMET in a.m., will need contrast for the study -Updated the patient, agrees with the plan.   Active Problems:   Hypoxia: Likely due to severe symptomatic anemia -CTA negative for PE, COVID negative -Chest x-ray had shown patchy atelectasis or minimal infiltrates at the left base, patient was placed on Zithromax and Rocephin -No fevers or worsening cough.  Blood cultures negative so far, antibiotics off    Atrial fibrillation, chronic -Continue beta-blocker for rate control, -Hold Eliquis until GI work-up completed and cleared by GI    HTN (hypertension) BP currently stable, continue Toprol-XL  Code Status: Full code DVT Prophylaxis:  SCD's, Eliquis on hold Family Communication: Discussed in detail with the  patient, all imaging results, lab results explained to the patient    Disposition Plan: Awaiting GI recommendations regarding capsule endoscopy and clearance for anticoagulation.  Possible DC today  Time Spent in minutes   25 minutes  Procedures:  CT angiogram of chest EGD: Normal esophagus, stomach, duodenum Colonoscopy: Minimal diverticulosis otherwise normal  Consultants:   Hematology oncology GI  Antimicrobials:   Anti-infectives (From admission, onward)   Start     Dose/Rate Route Frequency Ordered Stop   09/08/18 1645  cefTRIAXone (ROCEPHIN) 1  g in sodium chloride 0.9 % 100 mL IVPB     1 g 200 mL/hr over 30 Minutes Intravenous  Once 09/08/18 1633 09/08/18 1811   09/08/18 1645  azithromycin (ZITHROMAX) tablet 500 mg     500 mg Oral  Once 09/08/18 1633 09/08/18 1702         Medications  Scheduled Meds:  sodium chloride   Intravenous Once   hydrocortisone cream  1 application Topical BID   metoprolol succinate  50 mg Oral BID   pantoprazole  40 mg Oral Daily   sodium chloride flush  3 mL Intravenous Q12H   Continuous Infusions:  sodium chloride     sodium chloride 75 mL/hr at 09/11/18 1034   PRN Meds:.sodium chloride, acetaminophen **OR** acetaminophen, ALPRAZolam, HYDROcodone-acetaminophen, ondansetron **OR** ondansetron (ZOFRAN) IV, sodium chloride flush      Subjective:   [name] was seen and examined today.  No complaints per patient, seen earlier this morning.  Capsule endoscopy results pending.  Denied any abdominal pain, nausea vomiting hematochezia melena or hematemesis.  Patient denies chest pain, shortness of breath, numbess, tingling.   Objective:   Vitals:   09/10/18 2034 09/11/18 0535 09/11/18 0832 09/11/18 1350  BP: 126/75 127/71  132/76  Pulse: 67 84  63  Resp: '18 19  16  ' Temp: 97.9 F (36.6 C) 98 F (36.7 C)  97.7 F (36.5 C)  TempSrc: Oral Oral    SpO2: 99% 98% 98% (!) 77%  Weight:      Height:        Intake/Output Summary (Last 24 hours) at 09/11/2018 1558 Last data filed at 09/10/2018 1700 Gross per 24 hour  Intake 480 ml  Output --  Net 480 ml     Wt Readings from Last 3 Encounters:  09/10/18 74.8 kg  09/06/18 74.8 kg  05/26/17 80.7 kg   Physical Exam  General: Alert and oriented x 3, NAD  Eyes:   HEENT:    Cardiovascular: S1 S2 clear, no murmurs, RRR. No pedal edema b/l  Respiratory: CTAB, no wheezing, rales or rhonchi  Gastrointestinal: Soft, nontender, nondistended, NBS  Ext: no pedal edema bilaterally  Neuro: no new deficits  Musculoskeletal: No  cyanosis, clubbing  Skin: No rashes  Psych: Normal affect and demeanor, alert and oriented x3      Data Reviewed:  I have personally reviewed following labs and imaging studies  Micro Results Recent Results (from the past 240 hour(s))  SARS Coronavirus 2 [facility] order, Performed in [place] hospital lab)     Status: None   Collection Time: 09/08/18  3:47 PM  Result Value Ref Range Status   SARS Coronavirus 2 NEGATIVE NEGATIVE Final    Comment: (NOTE) If result is NEGATIVE SARS-CoV-2 target nucleic acids are NOT DETECTED. The SARS-CoV-2 RNA is generally detectable in upper and lower  respiratory specimens during the acute phase of infection. The lowest  concentration of SARS-CoV-2 viral copies this assay can detect  is 250  copies / mL. A negative result does not preclude SARS-CoV-2 infection  and should not be used as the sole basis for treatment or other  patient management decisions.  A negative result may occur with  improper specimen collection / handling, submission of specimen other  than nasopharyngeal swab, presence of viral mutation(s) within the  areas targeted by this assay, and inadequate number of viral copies  (<250 copies / mL). A negative result must be combined with clinical  observations, patient history, and epidemiological information. If result is POSITIVE SARS-CoV-2 target nucleic acids are DETECTED. The SARS-CoV-2 RNA is generally detectable in upper and lower  respiratory specimens dur ing the acute phase of infection.  Positive  results are indicative of active infection with SARS-CoV-2.  Clinical  correlation with patient history and other diagnostic information is  necessary to determine patient infection status.  Positive results do  not rule out bacterial infection or co-infection with other viruses. If result is PRESUMPTIVE POSTIVE SARS-CoV-2 nucleic acids MAY BE PRESENT.   A presumptive positive result was obtained on the submitted specimen   and confirmed on repeat testing.  While 2019 novel coronavirus  (SARS-CoV-2) nucleic acids may be present in the submitted sample  additional confirmatory testing may be necessary for epidemiological  and / or clinical management purposes  to differentiate between  SARS-CoV-2 and other Sarbecovirus currently known to infect humans.  If clinically indicated additional testing with an alternate test  methodology [phone]) is advised. The SARS-CoV-2 RNA is generally  detectable in upper and lower respiratory sp ecimens during the acute  phase of infection. The expected result is Negative. Fact Sheet for Patients:  StrictlyIdeas.no Fact Sheet for Healthcare Providers: BankingDealers.co.za This test is not yet approved or cleared by the [place] FDA and has been authorized for detection and/or diagnosis of SARS-CoV-2 by FDA under an Emergency Use Authorization (EUA).  This EUA will remain in effect (meaning this test can be used) for the duration of the COVID-19 declaration under Section 564(b)(1) of the Act, 21 U.S.C. section 360bbb-3(b)(1), unless the authorization is terminated or revoked sooner. Performed at [facility], [place] [address], [place], [place] [address]   Blood Culture (routine x 2)     Status: None (Preliminary result)   Collection Time: 09/08/18  3:47 PM  Result Value Ref Range Status   Specimen Description   Final    BLOOD LEFT FOREARM Performed at [facility], [place] [address], [place], [place] [address]    Special Requests   Final    BOTTLES DRAWN AEROBIC AND ANAEROBIC Blood Culture adequate volume Performed at [place] [address], [place], [place] [address]    Culture   Final    NO GROWTH 3 DAYS Performed at [facility], [place] [address], [place], [place] [address]    Report Status PENDING  Incomplete  Blood Culture (routine x 2)     Status: None  (Preliminary result)   Collection Time: 09/08/18  3:52 PM  Result Value Ref Range Status   Specimen Description   Final    BLOOD LEFT ANTECUBITAL Performed at [place] [address], [place], [place] [address]    Special Requests   Final    BOTTLES DRAWN AEROBIC AND ANAEROBIC Blood Culture results may not be optimal due to an inadequate volume of blood received in culture bottles Performed at [place] [address] [place],  [place] [address]    Culture   Final    NO GROWTH 3 DAYS Performed at [facility], [place] [address] [place], [place] [address]    Report Status PENDING  Incomplete    Radiology Reports Ct Angio Chest Pe W And/or Wo Contrast  Result Date: 09/08/2018 CLINICAL DATA:  Shortness of breath EXAM: CT ANGIOGRAPHY CHEST WITH CONTRAST TECHNIQUE: Multidetector CT imaging of the chest was performed using the standard protocol during bolus administration of intravenous contrast. Multiplanar CT image reconstructions and MIPs were obtained to evaluate the vascular anatomy. CONTRAST:  172m OMNIPAQUE IOHEXOL 350 MG/ML SOLN COMPARISON:  Chest x-ray earlier today. FINDINGS: Cardiovascular: Cardiomegaly. Aortic atherosclerosis. No filling defects in the pulmonary arteries to suggest pulmonary emboli. Mediastinum/Nodes: No enlarged mediastinal, hilar, or axillary lymph nodes. Thyroid gland, trachea, and esophagus demonstrate no significant findings. Lungs/Pleura: Dependent bibasilar atelectasis. No confluent opacities otherwise. No effusions. Upper Abdomen: Imaging into the upper abdomen shows no acute findings. Musculoskeletal: Chest wall soft tissues are unremarkable. No acute bony abnormality. Review of the MIP images confirms the above findings. IMPRESSION: No evidence of pulmonary embolus. Cardiomegaly. Dependent/bibasilar atelectasis. Aortic Atherosclerosis (ICD10-I70.0). Electronically Signed   By: KRolm BaptiseM.D.   On: 09/08/2018  19:27   Dg Chest Port 1 View  Result Date: 09/08/2018 CLINICAL DATA:  Shortness of breath EXAM: PORTABLE CHEST 1 VIEW COMPARISON:  07/28/2016 FINDINGS: Cardiomegaly with small left pleural effusion, central vascular congestion and mild edema. Patchy atelectasis or minimal infiltrate at the left base. No pneumothorax. IMPRESSION: 1. Cardiomegaly with vascular congestion, mild interstitial edema and small left effusion 2. Patchy atelectasis or minimal infiltrate at the left base Electronically Signed   By: KDonavan FoilM.D.   On: 09/08/2018 16:13    Lab Data:  CBC: Recent Labs  Lab 09/08/18 1556 09/09/18 0532 09/10/18 0548 09/11/18 0605  WBC 2.6* 2.8* 2.6* 2.8*  NEUTROABS 1.2* 1.4*  --   --   HGB 6.0* 7.4* 7.1* 7.3*  HCT 22.0* 25.2* 24.5* 25.8*  MCV 76.7* 78.3* 78.5* 80.9  PLT 156 138* 123* 1395   Basic Metabolic Panel: Recent Labs  Lab 09/08/18 1556 09/09/18 0532 09/10/18 0548 09/11/18 0605  NA 138 138 138 137  K 4.2 4.1 4.1 4.5  CL 109 109 110 109  CO2 23 23 21* 21*  GLUCOSE 106* 112* 96 100*  BUN 32* 30* 22 21  CREATININE 1.31* 1.12 1.08 1.32*  CALCIUM 8.5* 8.5* 8.4* 8.4*  MG  --  2.4  --   --   PHOS  --  4.0  --   --    GFR: Estimated Creatinine Clearance: 44.1 mL/min (A) (by C-G formula based on SCr of 1.32 mg/dL (H)). Liver Function Tests: Recent Labs  Lab 09/08/18 1556 09/09/18 0532  AST 29 24  ALT 18 16  ALKPHOS 62 57  BILITOT 1.1 1.4*  PROT 6.3* 5.7*  ALBUMIN 4.1 3.7   No results for input(s): LIPASE, AMYLASE in the last 168 hours. No results for input(s): AMMONIA in the last 168 hours. Coagulation Profile: No results for input(s): INR, PROTIME in the last 168 hours. Cardiac Enzymes: Recent Labs  Lab 09/08/18 1556  TROPONINI <0.03   BNP (last 3 results) No results for input(s): PROBNP in the last 8760 hours. HbA1C: No results for input(s): HGBA1C in the last 72 hours. CBG: No results for input(s): GLUCAP in the last 168 hours. Lipid  Profile: No results for input(s): CHOL, HDL, LDLCALC, TRIG, CHOLHDL, LDLDIRECT  in the last 72 hours. Thyroid Function Tests: Recent Labs    09/09/18 0532  TSH 2.531   Anemia Panel: Recent Labs    09/09/18 0532  RETICCTPCT 1.2   Urine analysis:    Component Value Date/Time   COLORURINE YELLOW 09/09/2018 1050   APPEARANCEUR CLEAR 09/09/2018 1050   LABSPEC 1.036 (H) 09/09/2018 1050   PHURINE 5.0 09/09/2018 1050   GLUCOSEU NEGATIVE 09/09/2018 1050   HGBUR NEGATIVE 09/09/2018 1050   BILIRUBINUR NEGATIVE 09/09/2018 1050   KETONESUR NEGATIVE 09/09/2018 1050   PROTEINUR NEGATIVE 09/09/2018 1050   NITRITE NEGATIVE 09/09/2018 1050   LEUKOCYTESUR NEGATIVE 09/09/2018 1050     Kinley Dozier M.D. [facility] 09/11/2018, 3:58 PM  Pager: [phone] Between 7am to 7pm - call Pager - [phone]  After 7pm go to [email or web address] - password [email or web address]  Call night coverage person covering after 7pm

## 2018-09-11 NOTE — Care Management Important Message (Signed)
Important Message  Patient DetailsIM Letter given to Velva Harman RN to present to the Patient  Name: John Holder MRN: [id number] Date of Birth: [birth year]   Medicare Important Message Given:  Yes    Kerin Salen 09/11/2018, 11:53 AM

## 2018-09-11 NOTE — Progress Notes (Signed)
John Holder is feeling a bit better but his Hb only rose to 7.3 with transfusion; he was also hydrated of course. He still gets SOB with activity but is comfortable at rest.  He tolerated the feraheme yesterday w/o event and will receive a second dose later this week at the cancer center. EGD and colonoscopy unrevealing with capsule endoscopy results pending.  I am comfortable releasing him tonight (if it's reasonably early and assuming of course the capsule results are negative) despite the ongoing anemia. He should correct that within the next 2 - 8 weeks and I will follow his counts closely.  I have discussed the relative leukopenia and thrombocytopenia with him. Review of the blood filn did not suggest dysplasia but this also will need to be followed up on and if it persists once the iron deficienecy has been corrected I will set him up for additional studies.  Appreciate your help to this patient. Please let me know if I can be of further help.

## 2018-09-11 NOTE — Progress Notes (Signed)
Patient ID: John Holder, male   DOB: 1932/01/15, 85 y.o.   MRN: [id number]   Capsule endoscopy showed question of a mid-small bowel mass that needs to be evaluated further with a CT enterography.  If serum Cr better tomorrow, then do a CT enterography with contrast to further evaluate. Continue to hold anticoagulation. NPO p MN for CT. D/W with patient by phone and Dr. Tana Coast.

## 2018-09-11 NOTE — Progress Notes (Signed)
Capsule study had not been downloaded by mid-morning; hopefully it will be downloaded by this afternoon.  My partner, Dr. Michail Sermon, will be reading the capsule once it's ready.

## 2018-09-12 ENCOUNTER — Inpatient Hospital Stay (HOSPITAL_COMMUNITY): Payer: Medicare Other

## 2018-09-12 ENCOUNTER — Other Ambulatory Visit: Payer: Self-pay | Admitting: Oncology

## 2018-09-12 ENCOUNTER — Other Ambulatory Visit: Payer: Medicare Other

## 2018-09-12 ENCOUNTER — Encounter (HOSPITAL_COMMUNITY): Payer: Self-pay | Admitting: Radiology

## 2018-09-12 LAB — BASIC METABOLIC PANEL
Anion gap: 5 (ref 5–15)
BUN: 27 mg/dL — ABNORMAL HIGH (ref 8–23)
CO2: 22 mmol/L (ref 22–32)
Calcium: 8.3 mg/dL — ABNORMAL LOW (ref 8.9–10.3)
Chloride: 109 mmol/L (ref 98–111)
Creatinine, Ser: 1.28 mg/dL — ABNORMAL HIGH (ref 0.61–1.24)
GFR calc Af Amer: 59 mL/min — ABNORMAL LOW (ref >60)
GFR calc non Af Amer: 51 mL/min — ABNORMAL LOW (ref >60)
Glucose, Bld: 108 mg/dL — ABNORMAL HIGH (ref 70–99)
Potassium: 4.3 mmol/L (ref 3.5–5.1)
Sodium: 136 mmol/L (ref 135–145)

## 2018-09-12 LAB — CBC
HCT: 27.4 % — ABNORMAL LOW (ref 39.0–52.0)
Hemoglobin: 7.5 g/dL — ABNORMAL LOW (ref 13.0–17.0)
MCH: 22.5 pg — ABNORMAL LOW (ref 26.0–34.0)
MCHC: 27.4 g/dL — ABNORMAL LOW (ref 30.0–36.0)
MCV: 82.3 fL (ref 80.0–100.0)
Platelets: 129 10*3/uL — ABNORMAL LOW (ref 150–400)
RBC: 3.33 MIL/uL — ABNORMAL LOW (ref 4.22–5.81)
RDW: 22.9 % — ABNORMAL HIGH (ref 11.5–15.5)
WBC: 3.7 10*3/uL — ABNORMAL LOW (ref 4.0–10.5)
nRBC: 1.1 % — ABNORMAL HIGH (ref 0.0–0.2)

## 2018-09-12 MED ORDER — IOHEXOL 300 MG/ML  SOLN
100.0000 mL | Freq: Once | INTRAMUSCULAR | Status: AC | PRN
  Administered 2018-09-12: 80 mL via INTRAVENOUS

## 2018-09-12 MED ORDER — BARIUM SULFATE 0.1 % PO SUSP
ORAL | Status: AC
  Filled 2018-09-12: qty 3

## 2018-09-12 MED ORDER — SODIUM CHLORIDE (PF) 0.9 % IJ SOLN
INTRAMUSCULAR | Status: AC
  Filled 2018-09-12: qty 50

## 2018-09-12 MED ORDER — HYDROCORTISONE 1 % EX CREA: 1.0000 "application " | TOPICAL_CREAM | Freq: Two times a day (BID) | CUTANEOUS | 0 refills | Status: DC | PRN

## 2018-09-12 MED ORDER — OMEPRAZOLE 40 MG PO CPDR: 40.0000 mg | DELAYED_RELEASE_CAPSULE | Freq: Every day | ORAL | 3 refills | Status: DC

## 2018-09-12 MED ORDER — FERROUS GLUCONATE 324 (38 FE) MG PO TABS: 324.0000 mg | ORAL_TABLET | Freq: Two times a day (BID) | ORAL | 3 refills | Status: DC

## 2018-09-12 NOTE — Progress Notes (Signed)
John Holder   DOB:10/04/1931   MV#:[id number]   MWU#:[id number]  Subjective:  John Holder had an uneventful night. He is aware of study results. Waiting on CT enterography hopefully this AM   Objective: older White man examined in bed Vitals:   09/11/18 1951 09/12/18 0536  BP: (!) 142/85 117/89  Pulse: 87 79  Resp: 20 18  Temp:  98.2 F (36.8 C)  SpO2:  96%    Body mass index is 23.01 kg/m.  Intake/Output Summary (Last 24 hours) at 09/12/2018 0806 Last data filed at 09/12/2018 0600 Gross per 24 hour  Intake 1697.46 ml  Output -  Net 1697.46 ml     COVID distance maintained  CBG (last 3)  No results for input(s): GLUCAP in the last 72 hours.   Labs:  Lab Results  Component Value Date   WBC 3.7 (L) 09/12/2018   HGB 7.5 (L) 09/12/2018   HCT 27.4 (L) 09/12/2018   MCV 82.3 09/12/2018   PLT 129 (L) 09/12/2018   NEUTROABS 1.4 (L) 09/09/2018    @LASTCHEMISTRY @  Urine Studies No results for input(s): UHGB, CRYS in the last 72 hours.  Invalid input(s): UACOL, UAPR, USPG, UPH, UTP, UGL, UKET, UBIL, UNIT, UROB, [place], UEPI, UWBC, [place], [place], [place], [place], [place]  Basic Metabolic Panel: Recent Labs  Lab 09/08/18 1556 09/09/18 0532 09/10/18 0548 09/11/18 0605 09/12/18 0635  NA 138 138 138 137 136  K 4.2 4.1 4.1 4.5 4.3  CL 109 109 110 109 109  CO2 23 23 21* 21* 22  GLUCOSE 106* 112* 96 100* 108*  BUN 32* 30* 22 21 27*  CREATININE 1.31* 1.12 1.08 1.32* 1.28*  CALCIUM 8.5* 8.5* 8.4* 8.4* 8.3*  MG  --  2.4  --   --   --   PHOS  --  4.0  --   --   --    GFR Estimated Creatinine Clearance: 45.5 mL/min (A) (by C-G formula based on SCr of 1.28 mg/dL (H)). Liver Function Tests: Recent Labs  Lab 09/08/18 1556 09/09/18 0532  AST 29 24  ALT 18 16  ALKPHOS 62 57  BILITOT 1.1 1.4*  PROT 6.3* 5.7*  ALBUMIN 4.1 3.7   No results for input(s): LIPASE, AMYLASE in the last 168 hours. No results for input(s): AMMONIA in the last 168 hours. Coagulation profile No results for  input(s): INR, PROTIME in the last 168 hours.  CBC: Recent Labs  Lab 09/08/18 1556 09/09/18 0532 09/10/18 0548 09/11/18 0605 09/12/18 0635  WBC 2.6* 2.8* 2.6* 2.8* 3.7*  NEUTROABS 1.2* 1.4*  --   --   --   HGB 6.0* 7.4* 7.1* 7.3* 7.5*  HCT 22.0* 25.2* 24.5* 25.8* 27.4*  MCV 76.7* 78.3* 78.5* 80.9 82.3  PLT 156 138* 123* 123* 129*   Cardiac Enzymes: Recent Labs  Lab 09/08/18 1556  TROPONINI <0.03   BNP: Invalid input(s): POCBNP CBG: No results for input(s): GLUCAP in the last 168 hours. D-Dimer No results for input(s): DDIMER in the last 72 hours. Hgb A1c No results for input(s): HGBA1C in the last 72 hours. Lipid Profile No results for input(s): CHOL, HDL, LDLCALC, TRIG, CHOLHDL, LDLDIRECT in the last 72 hours. Thyroid function studies No results for input(s): TSH, T4TOTAL, T3FREE, THYROIDAB in the last 72 hours.  Invalid input(s): FREET3 Anemia work up No results for input(s): VITAMINB12, FOLATE, FERRITIN, TIBC, IRON, RETICCTPCT in the last 72 hours. Microbiology Recent Results (from the past 240 hour(s))  SARS Coronavirus 2 [facility] order, Performed  in [place] lab)     Status: None   Collection Time: 09/08/18  3:47 PM  Result Value Ref Range Status   SARS Coronavirus 2 NEGATIVE NEGATIVE Final    Comment: (NOTE) If result is NEGATIVE SARS-CoV-2 target nucleic acids are NOT DETECTED. The SARS-CoV-2 RNA is generally detectable in upper and lower  respiratory specimens during the acute phase of infection. The lowest  concentration of SARS-CoV-2 viral copies this assay can detect is 250  copies / mL. A negative result does not preclude SARS-CoV-2 infection  and should not be used as the sole basis for treatment or other  patient management decisions.  A negative result may occur with  improper specimen collection / handling, submission of specimen other  than nasopharyngeal swab, presence of viral mutation(s) within the  areas targeted by this assay,  and inadequate number of viral copies  (<250 copies / mL). A negative result must be combined with clinical  observations, patient history, and epidemiological information. If result is POSITIVE SARS-CoV-2 target nucleic acids are DETECTED. The SARS-CoV-2 RNA is generally detectable in upper and lower  respiratory specimens dur ing the acute phase of infection.  Positive  results are indicative of active infection with SARS-CoV-2.  Clinical  correlation with patient history and other diagnostic information is  necessary to determine patient infection status.  Positive results do  not rule out bacterial infection or co-infection with other viruses. If result is PRESUMPTIVE POSTIVE SARS-CoV-2 nucleic acids MAY BE PRESENT.   A presumptive positive result was obtained on the submitted specimen  and confirmed on repeat testing.  While 2019 novel coronavirus  (SARS-CoV-2) nucleic acids may be present in the submitted sample  additional confirmatory testing may be necessary for epidemiological  and / or clinical management purposes  to differentiate between  SARS-CoV-2 and other Sarbecovirus currently known to infect humans.  If clinically indicated additional testing with an alternate test  methodology [phone]) is advised. The SARS-CoV-2 RNA is generally  detectable in upper and lower respiratory sp ecimens during the acute  phase of infection. The expected result is Negative. Fact Sheet for Patients:  StrictlyIdeas.no Fact Sheet for Healthcare Providers: BankingDealers.co.za This test is not yet approved or cleared by the [place] FDA and has been authorized for detection and/or diagnosis of SARS-CoV-2 by FDA under an Emergency Use Authorization (EUA).  This EUA will remain in effect (meaning this test can be used) for the duration of the COVID-19 declaration under Section 564(b)(1) of the Act, 21 U.S.C. section 360bbb-3(b)(1), unless  the authorization is terminated or revoked sooner. Performed at [facility], [place] [address], [place], [place] [address]   Blood Culture (routine x 2)     Status: None (Preliminary result)   Collection Time: 09/08/18  3:47 PM  Result Value Ref Range Status   Specimen Description   Final    BLOOD LEFT FOREARM Performed at [facility], [place] [address] [place], [place] [address]    Special Requests   Final    BOTTLES DRAWN AEROBIC AND ANAEROBIC Blood Culture adequate volume Performed at [place] [address] [place], [place] [address]    Culture   Final    NO GROWTH 3 DAYS Performed at [facility], [place] [address], [place],  [address]    Report Status PENDING  Incomplete  Blood Culture (routine x 2)     Status: None (Preliminary result)   Collection Time: 09/08/18  3:52 PM  Result Value Ref Range Status   Specimen Description   Final    BLOOD LEFT ANTECUBITAL Performed at [place] [address] [place], [place] [address]    Special Requests   Final    BOTTLES DRAWN AEROBIC AND ANAEROBIC Blood Culture results may not be optimal due to an inadequate volume of blood received in culture bottles Performed at [facility] [address] [place], [place] [address]    Culture   Final    NO GROWTH 3 DAYS Performed at [facility], [place] [address], [place], [place] [address]    Report Status PENDING  Incomplete      Studies:  No results found.  Assessment: 85 y.o. presenting with severe iron deficiency anemia 09/08/2018 (Hb 6.0, MCV 76.7, ferritin 7, iron sat 2%), guaiac negative but GI workup so far showing a possible mid-small bowel mass  (1) iron deficiency anemia:  (a) s/p PRBC transfusion X1 on 09/09/2018  (b) s/p feraheme 09/09/2018  (c) MCV now normal, Hb rising  (2) GIB workup:  (a) CT angio chest 09/08/2018 negative  (b) EGD and colonoscopy 09/10/2018  negative  (c) capsule endoscopy 09/11/2018 suggests mid-samll bowel mass  (d) CT enterography pending    Plan:  John Holder has a good understanding of the overall situation. He understands if the SB finding is strongly suggestive of CA he would need surgery. Dr Michail Sermon mentioned possible transfer or referral to [facility] for endoscopic biopsy of the lesion as a possible alternative. Hopefully we will have results this afternoon and can decide on a definitive plan.  Please note John Holder is a personal friend and has asked me to be involved in the decision-making process. Feel free to contact me directly as appropriate at [phone]   Chauncey Cruel, MD 09/12/2018  8:06 AM [facility] [address] [place], [place] [address] Tel. [phone]    Fax. [phone]

## 2018-09-12 NOTE — Progress Notes (Signed)
Patient ID: John Holder, male   DOB: 1932-05-04, 85 y.o.   MRN: [id number]  CT enterography did NOT show a small bowel mass and no definite wall thickening was seen, which is reassuring following the capsule endoscopy. Other findings noted. Stable kidney lesions followed by Urology. Question of possible cirrhosis. Question AVMs as source of bleeding but no bleeding or blood products seen during capsule study. Ok to resume Eliquis. Needs close f/u of H/H as outpt by his primary doctor. F/U with GI prn. CT findings and plan d/w John Holder. D/W Dr. Tana Coast. Will sign off. Call if questions.

## 2018-09-12 NOTE — Discharge Instructions (Signed)
Anemia  Anemia is a condition in which you do not have enough red blood cells or hemoglobin. Hemoglobin is a substance in red blood cells that carries oxygen. When you do not have enough red blood cells or hemoglobin (are anemic), your body cannot get enough oxygen and your organs may not work properly. As a result, you may feel very tired or have other problems. What are the causes? Common causes of anemia include:  Excessive bleeding. Anemia can be caused by excessive bleeding inside or outside the body, including bleeding from the intestine or from periods in women.  Poor nutrition.  Long-lasting (chronic) kidney, thyroid, and liver disease.  Bone marrow disorders.  Cancer and treatments for cancer.  HIV (human immunodeficiency virus) and AIDS (acquired immunodeficiency syndrome).  Treatments for HIV and AIDS.  Spleen problems.  Blood disorders.  Infections, medicines, and autoimmune disorders that destroy red blood cells. What are the signs or symptoms? Symptoms of this condition include:  Minor weakness.  Dizziness.  Headache.  Feeling heartbeats that are irregular or faster than normal (palpitations).  Shortness of breath, especially with exercise.  Paleness.  Cold sensitivity.  Indigestion.  Nausea.  Difficulty sleeping.  Difficulty concentrating. Symptoms may occur suddenly or develop slowly. If your anemia is mild, you may not have symptoms. How is this diagnosed? This condition is diagnosed based on:  Blood tests.  Your medical history.  A physical exam.  Bone marrow biopsy. Your health care provider may also check your stool (feces) for blood and may do additional testing to look for the cause of your bleeding. You may also have other tests, including:  Imaging tests, such as a CT scan or MRI.  Endoscopy.  Colonoscopy. How is this treated? Treatment for this condition depends on the cause. If you continue to lose a lot of blood, you may  need to be treated at a hospital. Treatment may include:  Taking supplements of iron, vitamin M08, or folic acid.  Taking a hormone medicine (erythropoietin) that can help to stimulate red blood cell growth.  Having a blood transfusion. This may be needed if you lose a lot of blood.  Making changes to your diet.  Having surgery to remove your spleen. Follow these instructions at home:  Take over-the-counter and prescription medicines only as told by your health care provider.  Take supplements only as told by your health care provider.  Follow any diet instructions that you were given.  Keep all follow-up visits as told by your health care provider. This is important. Contact a health care provider if:  You develop new bleeding anywhere in the body. Get help right away if:  You are very weak.  You are short of breath.  You have pain in your abdomen or chest.  You are dizzy or feel faint.  You have trouble concentrating.  You have bloody or black, tarry stools.  You vomit repeatedly or you vomit up blood. Summary  Anemia is a condition in which you do not have enough red blood cells or enough of a substance in your red blood cells that carries oxygen (hemoglobin).  Symptoms may occur suddenly or develop slowly.  If your anemia is mild, you may not have symptoms.  This condition is diagnosed with blood tests as well as a medical history and physical exam. Other tests may be needed.  Treatment for this condition depends on the cause of the anemia. This information is not intended to replace advice given to you by  your health care provider. Make sure you discuss any questions you have with your health care provider. Document Released: 06/10/2004 Document Revised: 06/04/2016 Document Reviewed: 06/04/2016 Elsevier Interactive Patient Education  2019 [place].  Hydrocortisone skin cream, ointment, lotion, or solution What is this medicine? HYDROCORTISONE (hye droe  KOR ti sone) is a corticosteroid. It is used on the skin to reduce swelling, redness, itching, and allergic reactions. This medicine may be used for other purposes; ask your health care provider or pharmacist if you have questions. COMMON BRAND NAME(S): Ala-Cort, Ala-Scalp, Anusol HC, Aqua Glycolic HC, Balneol for Her, Caldecort, Cetacort, Cortaid, Cortaid Advanced, Cortaid Intensive Therapy, Cortaid Sensitive Skin, CortAlo, Corticaine, Corticool, Cortizone, Cortizone-10, Cortizone-10 Cooling Relief, Cortizone-10 Intensive Healing, Cortizone-10 Plus, Dermarest Dricort, Dermarest Eczema, DERMASORB HC Complete, Gly-Cort, Hycort, Hydro Skin, Hydrocortisone in Absorbase, Hydroskin, Hytone, Instacort, Lacticare HC, Locoid, Locoid Lipocream, MiCort-HC, Monistat Complete Care Instant Itch Relief Cream, Neosporin Eczema, NuCort, Nutracort, NuZon, Pandel, Pediaderm HC, Penecort, Preparation H Hydrocortisone, Procto-Kit, Procto-Med HC, Procto-Pak, Proctocort, Proctocream-HC, Proctosol-HC, Proctozone-HC, Rederm, Sarnol-HC, Nurse, adult, Engineer, site, [place], Tucks HC, Vagisil Anti-Itch, [facility] Intensive Healing, Human resources officer What should I tell my health care provider before I take this medicine? They need to know if you have any of these conditions: -any active infection -diabetes -large areas of burned or damaged skin -skin wasting or thinning -an unusual or allergic reaction to hydrocortisone, corticosteroids, sulfites, other medicines, foods, dyes, or preservatives -pregnant or trying to get pregnant -breast-feeding How should I use this medicine? This medicine is for external use only. Do not take by mouth. Follow the directions on the prescription label. Wash your hands before and after use. Apply a thin film of medicine to the affected area. Do not cover with a bandage or dressing unless your doctor or health care professional tells you to. Do not use on healthy skin or over large areas of skin. Do not  get this medicine in your eyes. If you do, rinse out with plenty of cool tap water. Do not to use more medicine than prescribed. Do not use your medicine more often than directed or for more than 14 days. Talk to your pediatrician regarding the use of this medicine in children. Special care may be needed. While this drug may be prescribed for children as young as 23 years of age for selected conditions, precautions do apply. Do not use this medicine for the treatment of diaper rash unless directed to do so by your doctor or health care professional. If applying this medicine to the diaper area of a child, do not cover with tight-fitting diapers or plastic pants. This may increase the amount of medicine that passes through the skin and increase the risk of serious side effects. Elderly patients are more likely to have damaged skin through aging, and this may increase side effects. This medicine should only be used for brief periods and infrequently in older patients. Overdosage: If you think you have taken too much of this medicine contact a poison control center or emergency room at once. NOTE: This medicine is only for you. Do not share this medicine with others. What if I miss a dose? If you miss a dose, use it as soon as you can. If it is almost time for your next dose, use only that dose. Do not use double or extra doses. What may interact with this medicine? Interactions are not expected. Do not use any other skin products on the affected area without asking your doctor or health care  professional. This list may not describe all possible interactions. Give your health care provider a list of all the medicines, herbs, non-prescription drugs, or dietary supplements you use. Also tell them if you smoke, drink alcohol, or use illegal drugs. Some items may interact with your medicine. What should I watch for while using this medicine? Tell your doctor or health care professional if your symptoms do not  start to get better within 7 days or if they get worse. Tell your doctor or health care professional if you are exposed to anyone with measles or chickenpox, or if you develop sores or blisters that do not heal properly. What side effects may I notice from receiving this medicine? Side effects that you should report to your doctor or health care professional as soon as possible: -allergic reactions like skin rash, itching or hives, swelling of the face, lips, or tongue -burning feeling on the skin -dark red spots on the skin -infection -lack of healing of skin condition -painful, red, pus filled blisters in hair follicles -thinning of the skin Side effects that usually do not require medical attention (report to your doctor or health care professional if they continue or are bothersome): -dry skin, irritation -unusual increased growth of hair on the face or body This list may not describe all possible side effects. Call your doctor for medical advice about side effects. You may report side effects to FDA at 1-800-FDA-1088. Where should I keep my medicine? Keep out of the reach of children. Store at room temperature between 15 and 30 degrees C (59 and 86 degrees F). Do not freeze. Throw away any unused medicine after the expiration date. NOTE: This sheet is a summary. It may not cover all possible information. If you have questions about this medicine, talk to your doctor, pharmacist, or health care provider.  2019 Elsevier/Gold Standard (2007-09-15 16:08:48)  Iron tablets, capsules, extended-release tablets What is this medicine? IRON (AHY ern) replaces iron that is essential to healthy red blood cells. Iron is used to treat iron deficiency anemia. Anemia may cause problems like tiredness, shortness of breath, or slowed growth in children. Only take iron if your doctor has told you to. Do not treat yourself with iron if you are feeling tired. Most healthy people get enough iron in their  diets, particularly if they eat cereals, meat, poultry, and fish. This medicine may be used for other purposes; ask your health care provider or pharmacist if you have questions. COMMON BRAND NAME(S): Cheri Kearns, [place], [place], Feosol Complete, [employer], [place], [place], [place], [place], [place], Barrister's clerk, Ferrocite, Hemocyte, Nephro-Fer, NovaFerrum, ProFe, Proferrin ES, Slow Fe, Slow Iron, Tandem What should I tell my health care provider before I take this medicine? They need to know if you have any of these conditions: -frequently drink alcohol -bowel disease -hemolytic anemia -iron overload (hemochromatosis, hemosiderosis) -liver disease -problems with swallowing -stomach ulcer or other stomach problems -an unusual or allergic reaction to iron, other medicines, foods, dyes, or preservatives -pregnant or trying to get pregnant -breast-feeding How should I use this medicine? Take this medicine by mouth with a glass of water or fruit juice. Follow the directions on the prescription label. Swallow whole. Do not crush or chew. Take this medicine in an upright or sitting position. Try to take any bedtime doses at least 10 minutes before lying down. You may take this medicine with food. Take your medicine at regular intervals. Do not take your medicine more often than directed. Do not stop  taking except on your doctor's advice. Talk to your pediatrician regarding the use of this medicine in children. While this drug may be prescribed for selected conditions, precautions do apply. Overdosage: If you think you have taken too much of this medicine contact a poison control center or emergency room at once. NOTE: This medicine is only for you. Do not share this medicine with others. What if I miss a dose? If you miss a dose, take it as soon as you can. If it is almost time for your next dose, take only that dose. Do not take double or extra doses. What may interact with this  medicine? If you are taking this iron product, you should not take iron in any other medicine or dietary supplement. This medicine may also interact with the following medications: -alendronate -antacids -cefdinir -chloramphenicol -cholestyramine -deferoxamine -dimercaprol -etidronate -medicines for stomach ulcers or other stomach problems -pancreatic enzymes -quinolone antibiotics (examples: Cipro, Floxin, Levaquin, Tequin and others) -risedronate -tetracycline antibiotics (examples: doxycycline, tetracycline, minocycline, and others) -thyroid hormones This list may not describe all possible interactions. Give your health care provider a list of all the medicines, herbs, non-prescription drugs, or dietary supplements you use. Also tell them if you smoke, drink alcohol, or use illegal drugs. Some items may interact with your medicine. What should I watch for while using this medicine? Use iron supplements only as directed by your health care professional. Dennis Bast will need important blood work while you are taking this medicine. It may take 3 to 6 months of therapy to treat low iron levels. Pregnant women should follow the dose and length of iron treatment as directed by their doctors. Do not use iron longer than prescribed, and do not take a higher dose than recommended. Long-term use may cause excess iron to build-up in the body. Do not take iron with antacids. If you need to take an antacid, take it 2 hours after a dose of iron. What side effects may I notice from receiving this medicine? Side effects that you should report to your doctor or health care professional as soon as possible: -allergic reactions like skin rash, itching or hives, swelling of the face, lips, or tongue -blue lips, nails, or palms -dark colored stools (this may be due to the iron, but can indicate a more serious condition) -drowsiness -pain with or difficulty swallowing -pale or clammy skin -seizures -stomach  pain -unusually weak or tired -vomiting -weak, fast, or irregular heartbeat Side effects that usually do not require medical attention (report to your doctor or health care professional if they continue or are bothersome): -constipation -indigestion -nausea or stomach upset This list may not describe all possible side effects. Call your doctor for medical advice about side effects. You may report side effects to FDA at 1-800-FDA-1088. Where should I keep my medicine? Keep out of the reach of children. Even small amounts of iron can be harmful to a child. Store at room temperature between 15 and 30 degrees C (59 and 86 degrees F). Keep container tightly closed. Throw away any unused medicine after the expiration date. NOTE: This sheet is a summary. It may not cover all possible information. If you have questions about this medicine, talk to your doctor, pharmacist, or health care provider.  2019 Elsevier/Gold Standard (2007-09-19 17:03:41)

## 2018-09-12 NOTE — Discharge Summary (Addendum)
Physician Discharge Summary   Patient ID: John Holder MRN: [id number] DOB/AGE: [birth year] 85 y.o.  Admit date: 09/08/2018 Discharge date: 09/12/2018  Primary Care Physician:  Burnard Bunting, MD   Recommendations for Outpatient Follow-up:  1. Patient has appointment for labs and iron infusion on 09/14/2018, follow-up with Dr. Jana Hakim  Home Health: None, ambulating without any difficulty Equipment/Devices:   Discharge Condition: stable  CODE STATUS: FULL  Diet recommendation: Regular diet   Discharge Diagnoses:    . Hypoxia likely due to severe symptomatic anemia . Symptomatic anemia . Acute on chronic microcytic anemia secondary to iron deficiency anemia . Atrial fibrillation, chronic . HTN (hypertension)   Consults:   Gastroenterology, Dr. Cristina Gong Hematology, Dr. Jana Hakim   Allergies:  No Known Allergies   DISCHARGE MEDICATIONS: Allergies as of 09/12/2018   No Known Allergies     Medication List    TAKE these medications   ALPRAZolam 0.25 MG tablet Commonly known as:  XANAX Take 0.25 mg by mouth every 6 (six) hours as needed for anxiety.   BERBERINE COMPLEX PO Take 600 mg by mouth 2 (two) times a day.   Eliquis 5 MG Tabs tablet Generic drug:  apixaban Take 5 mg by mouth 2 (two) times daily.   ferrous gluconate 324 MG tablet Commonly known as:  FERGON Take 1 tablet (324 mg total) by mouth 2 (two) times daily with a meal.   glucosamine-chondroitin 500-400 MG tablet Take 1 tablet by mouth 2 (two) times daily.   hydrocortisone cream 1 % Apply 1 application topically 2 (two) times daily as needed for itching. Apply to rash on the back until resolved   losartan 50 MG tablet Commonly known as:  COZAAR Take 1 tablet (50 mg total) by mouth daily.   metoprolol succinate 50 MG 24 hr tablet Commonly known as:  TOPROL-XL Take 1 tablet (50 mg total) by mouth 2 (two) times a day. Take with or immediately following a meal.   multivitamin capsule Take 1  capsule by mouth daily.   omega-3 acid ethyl esters 1 g capsule Commonly known as:  LOVAZA Take 1 g by mouth daily.   omeprazole 40 MG capsule Commonly known as:  PRILOSEC Take 1 capsule (40 mg total) by mouth daily. What changed:    medication strength  how much to take        Brief H and P: For complete details please refer to admission H and P, but in brief Patient is a 85 year old male with hypertension, atrial fibrillation on eliquis, hyperlipidemia, history of nephrectomy for renal mass in the past.  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  At baseline he is very functionally active (used to play competitive tennis).  He noticed fatigue and generalized weakness.  Patient had a tele-visit with his cardiologist on 4/16 and metoprolol was increased.  Denied any fevers or chills, reported weight loss and swelling in his ankles.  Patient had another tele-visit on 4/22, he had labs done which showed hemoglobin of 6.0.  Patient otherwise denied any frank bleeding, no hematemesis or hematochezia or melena.   Hospital Course:  Acute microcytic symptomatic anemia secondary to iron deficiency -Patient presented with hemoglobin of 6.0, last hemoglobin 13.8 in 04/2018 -initially suspected hematological disorder hence hematology oncology was consulted, patient was seen by Dr. Jana Hakim.  Patient received 1 unit of packed RBC and Feraheme x1 -  Anemia panel showed severe iron deficiency anemia, per hematology, no evidence of hemolysis, leuko-erythroblastic or leukemic findings.  No coagulopathy or significant renal insufficiency, does not need any bone marrow biopsy or hematological work-up. -Patient is also on Eliquis for atrial fibrillation, was placed on hold - GI was consulted, underwent EGD and colonoscopy today.  EGD normal, colonoscopy showed minimal diverticulosis  otherwise no source of anemia or iron deficiency identified.  - Video small bowel capsule endoscopy initiated on 4/26, there was a question of mid small bowel mass hence patient underwent CT enterography -CT enterography did not show a small bowel mass and no definite wall thickening.  Patient has stable kidney lesions and followed by urology. -Per GI, patient was cleared to restart Eliquis.  He has follow-up appointment on 09/13/2020 recheck labs and iron infusion. Hemoglobin 7.5 at the time of discharge.     Hypoxia: Likely due to severe symptomatic anemia -CTA negative for PE, COVID negative -Chest x-ray had shown patchy atelectasis or minimal infiltrates at the left base, patient was initially placed on Zithromax and Rocephin -No fevers or worsening cough.  Blood cultures negative so far, antibiotics off.  Pneumonia ruled out.    Atrial fibrillation, chronic -Continue beta-blocker for rate control, -Okay to restart Eliquis per GI    HTN (hypertension) BP currently stable, continue Toprol-XL and losartan   Day of Discharge S: No acute complaints, feeling overall better, wants to go home  BP (!) 148/91 (BP Location: Right Arm)   Pulse 89   Temp (!) 97.5 F (36.4 C) (Oral)   Resp 16   Ht _0  (1.803 m)   Wt 74.8 kg   SpO2 95%   BMI 23.01 kg/m   Physical Exam: General: Alert and awake oriented x3 not in any acute distress. HEENT: anicteric sclera, pupils reactive to light and accommodation CVS: S1-S2 clear no murmur rubs or gallops Chest: clear to auscultation bilaterally, no wheezing rales or rhonchi Abdomen: soft nontender, nondistended, normal bowel sounds Extremities: no cyanosis, clubbing or edema noted bilaterally Neuro: Cranial nerves II-XII intact, no focal neurological deficits   The results of significant diagnostics from this hospitalization (including imaging, microbiology, ancillary and laboratory) are listed below for reference.       Procedures/Studies:  Ct Angio Chest Pe W And/or Wo Contrast  Result Date: 09/08/2018 CLINICAL DATA:  Shortness of breath EXAM: CT ANGIOGRAPHY CHEST WITH CONTRAST TECHNIQUE: Multidetector CT imaging of the chest was performed using the standard protocol during bolus administration of intravenous contrast. Multiplanar CT image reconstructions and MIPs were obtained to evaluate the vascular anatomy. CONTRAST:  155m OMNIPAQUE IOHEXOL 350 MG/ML SOLN COMPARISON:  Chest x-ray earlier today. FINDINGS: Cardiovascular: Cardiomegaly. Aortic atherosclerosis. No filling defects in the pulmonary arteries to suggest pulmonary emboli. Mediastinum/Nodes: No enlarged mediastinal, hilar, or axillary lymph nodes. Thyroid gland, trachea, and esophagus demonstrate no significant findings. Lungs/Pleura: Dependent bibasilar atelectasis. No confluent opacities otherwise. No effusions. Upper Abdomen: Imaging into the upper abdomen shows no acute findings. Musculoskeletal: Chest wall soft tissues are unremarkable. No acute bony abnormality. Review of the MIP images confirms the above findings. IMPRESSION: No evidence of pulmonary embolus. Cardiomegaly. Dependent/bibasilar atelectasis. Aortic Atherosclerosis (ICD10-I70.0). Electronically Signed   By: KRolm BaptiseM.D.   On: 09/08/2018 19:27   Ct Entero Abd/pelvis W Contast  Result Date: 09/12/2018 CLINICAL DATA:  Iron deficiency anemia with GI bleeding. Capsule endoscopy suggests mid small bowel mass. EXAM: CT ABDOMEN AND PELVIS WITH CONTRAST (ENTEROGRAPHY) TECHNIQUE: Multidetector CT of the abdomen  and pelvis during bolus administration of intravenous contrast. Negative oral contrast was given. CONTRAST:  57m OMNIPAQUE IOHEXOL 300 MG/ML  SOLN COMPARISON:  None. FINDINGS: Lower chest: Heart is enlarged. Tiny pleural effusions noted bilaterally. Calcified granuloma right middle lobe. Lower lobe atelectasis evident bilaterally. Hepatobiliary: Irregular liver contour is  associated with prominence of the lateral segment left liver and caudate lobe, raising the question of cirrhosis. Multiple well-defined low-density lesions of varying size measure up to about 2.9 cm maximum diameter. The more dominant of these lesions approach water attenuation and are likely cysts, as seen on previous MRI of 04/27/2017. Gallbladder is surgically absent. No intrahepatic or extrahepatic biliary dilation. Pancreas: No focal mass lesion. No dilatation of the main duct. No intraparenchymal cyst. No peripancreatic edema. Previous MRI documented a tiny cystic lesion in the uncinate process of the pancreas, not visible today. Spleen: No splenomegaly. No focal mass lesion. Adrenals/Urinary Tract: 2 cm exophytic lesion anterior upper pole right kidney is stable since previous MRI and was characterized as solid lesion at that time period features today again suggests enhancement within the lesion. The second tiny enhancing lesion identified on previous MRI is stable, measuring 10 mm today (25/8 lower pole right kidney) compared to 11 mm previously. Left kidney surgically absent. Mild fullness noted in the right ureter. The urinary bladder appears normal for the degree of distention. Stomach/Bowel: Stomach is unremarkable. No gastric wall thickening. No evidence of outlet obstruction. Duodenum is normally positioned as is the ligament of Treitz. No abnormal enhancement identified in small bowel loops. No small bowel dilatation. No small bowel mass lesion evident. The terminal ileum is normal. The appendix is not visualized, but there is no edema or inflammation in the region of the cecum. Capsule endoscope noted in the sigmoid colon. Vascular/Lymphatic: There is abdominal aortic atherosclerosis without aneurysm. Small mesenteric and retroperitoneal lymph nodes are seen in the abdomen. 12 mm short axis pericaval node (49/2) was 9 mm on previous MRI. 13 mm short axis right common iliac node (56/2) was not  included in the field of view for previous MRI. No pelvic sidewall lymphadenopathy. Reproductive: Prostate gland is enlarged. Other: Small to moderate volume ascites evident. There is diffuse mesenteric, retroperitoneal, presacral, and subcutaneous edema. Musculoskeletal: Small umbilical hernia contains a knuckle of small bowel without complicating features. Bony mineralization is diffusely heterogeneous. Mild compression deformity noted at L1. IMPRESSION: 1. No findings to suggest small bowel mass. No definite small bowel wall thickening. 2. Small to moderate volume ascites with diffuse mesenteric and body wall edema. 3. Irregularity of liver contour with prominent lateral segment left liver and caudate lobe raises the question of but is not diagnostic for cirrhosis. 4. Small enhancing lesions in the right kidney, suspicious for renal cell carcinoma, but stable since MRI of 04/27/2017. 5. Mild lymphadenopathy along the inferior IVC and right common iliac chain, and area not included on the previous MRI. As metastatic disease cannot be excluded, follow-up may be warranted to ensure stability. 6. Left nephrectomy. 7. Tiny bilateral pleural effusions. 8.  Aortic Atherosclerois (ICD10-170.0) Electronically Signed   By: EMisty StanleyM.D.   On: 09/12/2018 11:18   Dg Chest Port 1 View  Result Date: 09/08/2018 CLINICAL DATA:  Shortness of breath EXAM: PORTABLE CHEST 1 VIEW COMPARISON:  07/28/2016 FINDINGS: Cardiomegaly with small left pleural effusion, central vascular congestion and mild edema. Patchy atelectasis or minimal infiltrate at the left base. No pneumothorax. IMPRESSION: 1. Cardiomegaly with vascular congestion, mild interstitial edema and small left  effusion 2. Patchy atelectasis or minimal infiltrate at the left base Electronically Signed   By: Donavan Foil M.D.   On: 09/08/2018 16:13      LAB RESULTS: Basic Metabolic Panel: Recent Labs  Lab 09/09/18 0532  09/11/18 0605 09/12/18 0635  NA 138    < > 137 136  K 4.1   < > 4.5 4.3  CL 109   < > 109 109  CO2 23   < > 21* 22  GLUCOSE 112*   < > 100* 108*  BUN 30*   < > 21 27*  CREATININE 1.12   < > 1.32* 1.28*  CALCIUM 8.5*   < > 8.4* 8.3*  MG 2.4  --   --   --   PHOS 4.0  --   --   --    < > = values in this interval not displayed.   Liver Function Tests: Recent Labs  Lab 09/08/18 1556 09/09/18 0532  AST 29 24  ALT 18 16  ALKPHOS 62 57  BILITOT 1.1 1.4*  PROT 6.3* 5.7*  ALBUMIN 4.1 3.7   No results for input(s): LIPASE, AMYLASE in the last 168 hours. No results for input(s): AMMONIA in the last 168 hours. CBC: Recent Labs  Lab 09/09/18 0532  09/11/18 0605 09/12/18 0635  WBC 2.8*   < > 2.8* 3.7*  NEUTROABS 1.4*  --   --   --   HGB 7.4*   < > 7.3* 7.5*  HCT 25.2*   < > 25.8* 27.4*  MCV 78.3*   < > 80.9 82.3  PLT 138*   < > 123* 129*   < > = values in this interval not displayed.   Cardiac Enzymes: Recent Labs  Lab 09/08/18 1556  TROPONINI <0.03   BNP: Invalid input(s): POCBNP CBG: No results for input(s): GLUCAP in the last 168 hours.    Disposition and Follow-up: Discharge Instructions    Diet general   Complete by:  As directed    Discharge instructions   Complete by:  As directed    Please follow-up with Dr. Jana Hakim to get your CBC checked in 1 week.  You may need another transfusion.   Increase activity slowly   Complete by:  As directed        DISPOSITION: Home   DISCHARGE FOLLOW-UP Follow-up Information    Magrinat, Virgie Dad, MD Follow up on 09/14/2018.   Specialty:  Oncology Why:  at 7:30am for labs, CBC Contact information: [place] [address] [phone]        Burnard Bunting, MD. Schedule an appointment as soon as possible for a visit in 2 week(s).   Specialty:  Internal Medicine Contact information: [place] [address] [phone]        Nahser, Wonda Cheng, MD .   Specialty:  Cardiology Contact information: [address] [address] [phone]            Time coordinating discharge:  35 minutes  Signed:   Estill Cotta M.D. [facility] 09/12/2018, 12:58 PM

## 2018-09-13 ENCOUNTER — Other Ambulatory Visit: Payer: Self-pay | Admitting: Oncology

## 2018-09-13 ENCOUNTER — Telehealth: Payer: Self-pay | Admitting: Oncology

## 2018-09-13 DIAGNOSIS — D508 Other iron deficiency anemias: Secondary | ICD-10-CM

## 2018-09-13 LAB — CULTURE, BLOOD (ROUTINE X 2)
Culture: NO GROWTH
Culture: NO GROWTH
Special Requests: ADEQUATE

## 2018-09-13 NOTE — Telephone Encounter (Signed)
Scheduled appts per 4/25 sch message - per RN val - Dr. Jana Hakim spoke with patient and they are aware of appts on 4/30

## 2018-09-14 ENCOUNTER — Inpatient Hospital Stay: Payer: Medicare Other | Attending: Oncology

## 2018-09-14 ENCOUNTER — Other Ambulatory Visit: Payer: Self-pay

## 2018-09-14 ENCOUNTER — Other Ambulatory Visit: Payer: Self-pay | Admitting: Oncology

## 2018-09-14 ENCOUNTER — Inpatient Hospital Stay: Payer: Medicare Other

## 2018-09-14 ENCOUNTER — Telehealth: Payer: Self-pay | Admitting: Oncology

## 2018-09-14 VITALS — BP 115/77 | HR 61 | Temp 97.7°F | Resp 20

## 2018-09-14 DIAGNOSIS — D508 Other iron deficiency anemias: Secondary | ICD-10-CM

## 2018-09-14 DIAGNOSIS — D509 Iron deficiency anemia, unspecified: Secondary | ICD-10-CM | POA: Insufficient documentation

## 2018-09-14 DIAGNOSIS — D649 Anemia, unspecified: Secondary | ICD-10-CM

## 2018-09-14 LAB — CBC WITH DIFFERENTIAL/PLATELET
Abs Immature Granulocytes: 0.02 10*3/uL (ref 0.00–0.07)
Basophils Absolute: 0 10*3/uL (ref 0.0–0.1)
Basophils Relative: 1 %
Eosinophils Absolute: 0.1 10*3/uL (ref 0.0–0.5)
Eosinophils Relative: 4 %
HCT: 30.5 % — ABNORMAL LOW (ref 39.0–52.0)
Hemoglobin: 8.7 g/dL — ABNORMAL LOW (ref 13.0–17.0)
Immature Granulocytes: 1 %
Lymphocytes Relative: 21 %
Lymphs Abs: 0.7 10*3/uL (ref 0.7–4.0)
MCH: 23.4 pg — ABNORMAL LOW (ref 26.0–34.0)
MCHC: 28.5 g/dL — ABNORMAL LOW (ref 30.0–36.0)
MCV: 82 fL (ref 80.0–100.0)
Monocytes Absolute: 0.4 10*3/uL (ref 0.1–1.0)
Monocytes Relative: 12 %
Neutro Abs: 2.3 10*3/uL (ref 1.7–7.7)
Neutrophils Relative %: 61 %
Platelets: 130 10*3/uL — ABNORMAL LOW (ref 150–400)
RBC: 3.72 MIL/uL — ABNORMAL LOW (ref 4.22–5.81)
RDW: 26.1 % — ABNORMAL HIGH (ref 11.5–15.5)
WBC: 3.6 10*3/uL — ABNORMAL LOW (ref 4.0–10.5)
nRBC: 0 % (ref 0.0–0.2)

## 2018-09-14 LAB — IRON AND TIBC
Iron: 29 ug/dL — ABNORMAL LOW (ref 42–163)
Saturation Ratios: 8 % — ABNORMAL LOW (ref 20–55)
TIBC: 353 ug/dL (ref 202–409)
UIBC: 323 ug/dL (ref 117–376)

## 2018-09-14 LAB — FERRITIN: Ferritin: 109 ng/mL (ref 24–336)

## 2018-09-14 MED ORDER — SODIUM CHLORIDE 0.9 % IV SOLN
Freq: Once | INTRAVENOUS | Status: AC
  Administered 2018-09-14: 09:00:00 via INTRAVENOUS
  Filled 2018-09-14: qty 250

## 2018-09-14 MED ORDER — SODIUM CHLORIDE 0.9 % IV SOLN
510.0000 mg | Freq: Once | INTRAVENOUS | Status: AC
  Administered 2018-09-14: 510 mg via INTRAVENOUS
  Filled 2018-09-14: qty 17

## 2018-09-14 NOTE — Progress Notes (Signed)
John Holder came in today to receive the second Feraheme.  His hemoglobin is already rising nicely and his MCV has normalized.  His white cell count is also normalizing.  He is back on Eliquis, with no bleeding.  He feels bloated and his weight has gone up almost 20 pounds from the time of admission.  This is partly due to hydration and possibly due to low albumin.  He was thinking he might benefit from a diuretic but I think it safe for for him to simply stay away from salt for a while and then slowly resume his normal activities  Once he can walk without feeling short of breath he is welcome to walk as far as he can.  I anticipate he will be taking good long hikes by the middle of May and be back playing tennis in June.  We are going to repeat a CBC in 2 months and then every 3 months for the next year I will see him a year from now.  He knows to call for any other issues that may develop before then.

## 2018-09-14 NOTE — Telephone Encounter (Signed)
Scheduled labs and f/u per sch msg

## 2018-09-14 NOTE — Patient Instructions (Signed)

## 2018-09-20 ENCOUNTER — Encounter: Payer: Self-pay | Admitting: Physician Assistant

## 2018-09-20 ENCOUNTER — Telehealth: Payer: Self-pay | Admitting: Physician Assistant

## 2018-09-20 ENCOUNTER — Telehealth (INDEPENDENT_AMBULATORY_CARE_PROVIDER_SITE_OTHER): Payer: Medicare Other | Admitting: Physician Assistant

## 2018-09-20 VITALS — BP 139/78 | HR 77 | Ht 71.0 in | Wt 180.0 lb

## 2018-09-20 DIAGNOSIS — I4819 Other persistent atrial fibrillation: Secondary | ICD-10-CM

## 2018-09-20 DIAGNOSIS — E8770 Fluid overload, unspecified: Secondary | ICD-10-CM | POA: Diagnosis not present

## 2018-09-20 DIAGNOSIS — D508 Other iron deficiency anemias: Secondary | ICD-10-CM

## 2018-09-20 DIAGNOSIS — I10 Essential (primary) hypertension: Secondary | ICD-10-CM

## 2018-09-20 DIAGNOSIS — I340 Nonrheumatic mitral (valve) insufficiency: Secondary | ICD-10-CM

## 2018-09-20 MED ORDER — FUROSEMIDE 40 MG PO TABS: ORAL_TABLET | ORAL | 3 refills | Status: DC

## 2018-09-20 NOTE — Telephone Encounter (Signed)
New message   Patient is trying to get setup on virtual visit. Please call. Patient is having trouble.

## 2018-09-20 NOTE — Progress Notes (Signed)
Virtual Visit via Video Note   This visit type was conducted due to national recommendations for restrictions regarding the COVID-19 Pandemic (e.g. social distancing) in an effort to limit this patient's exposure and mitigate transmission in our community.  Due to his co-morbid illnesses, this patient is at least at moderate risk for complications without adequate follow up.  This format is felt to be most appropriate for this patient at this time.  All issues noted in this document were discussed and addressed.  A limited physical exam was performed with this format.  Please refer to the patient's chart for his consent to telehealth for [facility].   Date:  09/20/2018   ID:  John Holder, DOB 1931-09-29, MRN [id number]  Patient Location: Home Provider Location: Home  PCP:  Burnard Bunting, MD  Cardiologist:  Mertie Moores, MD   Electrophysiologist:  None  Hematologist:  Dr. Jana Hakim Gastroenterologist:  Dr. Amedeo Plenty  Evaluation Performed:  Follow-Up Visit  Chief Complaint: Follow-up on atrial fibrillation and dyspnea; recent admission for symptomatic anemia  History of Present Illness:    John Holder is a 85 y.o. male with atrial fibrillation, hypertension, prior L nephrectomy due to renal mass.  He was recently evaluated in the AF Clinic due to fatigue.  A LT-Cardiac monitor demonstrated periodic uncontrolled HRs and his beta-blocker was adjusted.    I evaluated him on 09/06/2018 and he continued to complain of dyspnea with minimal activity.  I adjusted his beta-blocker and initially planned to pursue an echocardiogram.  However, the patient contacted the office with evidence of hypoxia on his own pulse oximeter.  He was directed to be emergency room where it was noted his hemoglobin was 6.  He was evaluated by hematology (Dr. Jana Hakim).  He was transfused with PRBCs and given Feraheme.  He was also seen by gastroenterology.  EGD was normal and colonoscopy showed no source of anemia.   CT enterography was negative for a small bowel mass.  CT was negative for pulmonary embolism.  COVID test was negative.  He was covered for pneumonia with antibiotics due to possible minimal infiltrate at the left base on chest x-ray.  Eliquis was initially held but was resumed per direction by gastroenterology prior to discharge.  Since DC, he has had another Feraheme injection per Dr. Jana Hakim.    Today, he notes he is doing much better.  His breathing has improved.  He is really not having any more issues with shortness of breath.  He has not had chest pain, paroxysmal internal dyspnea.  However, he does note significant weight gain, abdominal bloating and lower extremity swelling since he was in the hospital.  He did receive a lot of IV fluids as well as 2 units of blood and contrast for CT scan.  He has Lasix 40 mg but is not currently taking this.  The patient does not have symptoms concerning for COVID-19 infection (fever, chills, cough, or new shortness of breath).    Past Medical History:  Diagnosis Date  . Hypertension   . Iron deficiency anemia    amdx 08/2018 >> Tx with PRBCs and Feraheme started; no GI source per workup; followed by Heme (Dr. Jana Hakim) and GI (Dr. Cristina Gong)   . Left renal mass    s/p L nephrectomy  . Mitral regurgitation    Echo 02/2016:  Mild conc LVH, EF 55-60, mild to mod MR, mod LAE, mod to severe RAE, mod TR, PASP 41   . Persistent Atrial  Fibrillation    Past Surgical History:  Procedure Laterality Date  . CARDIOVERSION N/A 02/04/2016   Procedure: CARDIOVERSION;  Surgeon: Thayer Headings, MD;  Location: [place];  Service: Cardiovascular;  Laterality: N/A;  . COLONOSCOPY WITH PROPOFOL N/A 09/10/2018   Procedure: COLONOSCOPY WITH PROPOFOL;  Surgeon: Ronald Lobo, MD;  Location: [facility];  Service: Endoscopy;  Laterality: N/A;  . ESOPHAGOGASTRODUODENOSCOPY (EGD) WITH PROPOFOL N/A 09/10/2018   Procedure: ESOPHAGOGASTRODUODENOSCOPY (EGD) WITH PROPOFOL;   Surgeon: Ronald Lobo, MD;  Location: [facility];  Service: Endoscopy;  Laterality: N/A;  . GIVENS CAPSULE STUDY N/A 09/10/2018   Procedure: GIVENS CAPSULE STUDY;  Surgeon: Ronald Lobo, MD;  Location: [facility];  Service: Endoscopy;  Laterality: N/A;     Current Meds  Medication Sig  . ALPRAZolam (XANAX) 0.25 MG tablet Take 0.25 mg by mouth every 6 (six) hours as needed for anxiety.   Jolyne Loa Grape-Goldenseal (BERBERINE COMPLEX PO) Take 600 mg by mouth 2 (two) times a day.  [name] ELIQUIS 5 MG TABS tablet Take 5 mg by mouth 2 (two) times daily.   . ferrous gluconate (FERGON) 324 MG tablet Take 1 tablet (324 mg total) by mouth 2 (two) times daily with a meal.  . glucosamine-chondroitin 500-400 MG tablet Take 1 tablet by mouth 2 (two) times daily.   . hydrocortisone cream 1 % Apply 1 application topically 2 (two) times daily as needed for itching. Apply to rash on the back until resolved  . losartan (COZAAR) 50 MG tablet Take 1 tablet (50 mg total) by mouth daily.  . metoprolol succinate (TOPROL-XL) 50 MG 24 hr tablet Take 1 tablet (50 mg total) by mouth 2 (two) times a day. Take with or immediately following a meal.  . Multiple Vitamin (MULTIVITAMIN) capsule Take 1 capsule by mouth daily.  [name] omeprazole (PRILOSEC) 40 MG capsule Take 1 capsule (40 mg total) by mouth daily.     Allergies:   Patient has no known allergies.   Social History   Tobacco Use  . Smoking status: Never Smoker  . Smokeless tobacco: Never Used  Substance Use Topics  . Alcohol use: Yes    Alcohol/week: 0.0 standard drinks    Comment: occasional  . Drug use: No     Family Hx: The patient's family history includes Breast cancer in his mother; Hypertension in his father. There is no history of Diabetes, Stroke, or CAD.  ROS:   Please see the history of present illness.    All other systems reviewed and are negative.   Prior CV studies:   The following studies were reviewed today:   Echo  02/18/16 Mild conc LVH, EF 55-60, mild to mod MR, mod LAE, mod to severe RAE, mod TR, PASP 41   Labs/Other Tests and Data Reviewed:    Chest CTA 09/08/2018 IMPRESSION: No evidence of pulmonary embolus. Cardiomegaly Dependent/bibasilar atelectasis. Aortic Atherosclerosis (ICD10-I70.0).  Abd/Pelvic CT Enterography 09/12/2018 IMPRESSION: 1. No findings to suggest small bowel mass. No definite small bowel wall thickening. 2. Small to moderate volume ascites with diffuse mesenteric and body wall edema. 3. Irregularity of liver contour with prominent lateral segment left liver and caudate lobe raises the question of but is not diagnostic for cirrhosis. 4. Small enhancing lesions in the right kidney, suspicious for renal cell carcinoma, but stable since MRI of 04/27/2017. 5. Mild lymphadenopathy along the inferior IVC and right common iliac chain, and area not included on the previous MRI. As metastatic disease cannot be excluded, follow-up may  be warranted to ensure stability. 6. Left nephrectomy. 7. Tiny bilateral pleural effusions. 8.  Aortic Atherosclerois (ICD10-170.0)   EKG:  No ECG reviewed.  Recent Labs: 09/08/2018: B Natriuretic Peptide 511.2 09/09/2018: ALT 16; Magnesium 2.4; TSH 2.531 09/12/2018: BUN 27; Creatinine, Ser 1.28; Potassium 4.3; Sodium 136 09/14/2018: Hemoglobin 8.7; Platelets 130   Lab Results  Component Value Date   HGB 8.7 (L) 09/14/2018   HGB 7.5 (L) 09/12/2018   HGB 7.3 (L) 09/11/2018   HGB 7.1 (L) 09/10/2018   HGB 7.4 (L) 09/09/2018   HGB 6.0 (LL) 09/08/2018    Lab Results  Component Value Date   [place] NEGATIVE 09/08/2018     Wt Readings from Last 3 Encounters:  09/20/18 180 lb (81.6 kg)  09/10/18 165 lb (74.8 kg)  09/06/18 165 lb (74.8 kg)     Objective:    Vital Signs:  BP 139/78   Pulse 77   Ht 5\' 11"  (1.803 m)   Wt 180 lb (81.6 kg)   BMI 25.10 kg/m    VITAL SIGNS:  reviewed GEN:  no acute distress EYES:  sclerae  anicteric, EOMI - Extraocular Movements Intact RESPIRATORY:  Normal respiratory effort NEURO:  alert and oriented x 3, no obvious focal deficit PSYCH:  normal affect  ASSESSMENT & PLAN:    Persistent atrial fibrillation Overall, he seems to be stable.  He was cleared by gastroenterology to resume anticoagulation.  He remains on Apixaban 5 mg twice daily.  His rate seems to be well controlled.  Hypervolemia, unspecified hypervolemia type His BNP was somewhat elevated in the hospital in the 500 range.  I suspect that this was likely related to his anemia.  He also received significant amounts of fluid in the hospital as well as several CT studies and EGD/colonoscopy.  His weight has gone up 19 pounds and he notes abdominal bloating and swelling.  Therefore, I have asked him to go ahead and take Lasix 40 mg daily for the next 3 days.  He should take it as needed after that.  If his symptoms or not improving, he should contact our office.  Other iron deficiency anemia Hemoglobin has improved.  He has follow-up later this month with hematology.  He is currently on iron and has received transfusion with PRBCs as well as Feraheme.  Essential hypertension Fair control.  Continue current therapy.  Mitral valve insufficiency, unspecified etiology Mild to moderate by echocardiogram in October 2017.  I do think at some point we should obtain a follow-up echocardiogram as originally planned to reassess his LV function and mitral regurgitation.  He notes a nonproductive cough which may be related to volume excess.  I will try to arrange an echocardiogram in the next 4 weeks.   Time:   Today, I have spent 19 minutes with the patient with telehealth technology discussing the above problems.     Medication Adjustments/Labs and Tests Ordered: Current medicines are reviewed at length with the patient today.  Concerns regarding medicines are outlined above.   Tests Ordered: No orders of the defined types  were placed in this encounter.   Medication Changes: Meds ordered this encounter  Medications  . furosemide (LASIX) 40 MG tablet    Sig: Take 40 mg once daily for 3 days, then as needed for swelling    Dispense:  90 tablet    Refill:  3    Order Specific Question:   Supervising Provider    Answer:   Lelon Perla [[id number]]  Disposition:  Follow up 6-8 weeks  Signed, John Dopp, PA-C  09/20/2018 3:03 PM    [facility]

## 2018-09-20 NOTE — Patient Instructions (Signed)
Medication Instructions:  Take Lasix 40 mg daily for 3 days, then take as needed only If you do not have any improvement in your swelling, call our office  If you need a refill on your cardiac medications before your next appointment, please call your pharmacy.   Lab work: None  If you have labs (blood work) drawn today and your tests are completely normal, you will receive your results only by: [name] MyChart Message (if you have MyChart) OR . A paper copy in the mail If you have any lab test that is abnormal or we need to change your treatment, we will call you to review the results.  Testing/Procedures: In 4 weeks -we will try to arrange an echocardiogram  Follow-Up: At [facility], you and your health needs are our priority.  As part of our continuing mission to provide you with exceptional heart care, we have created designated Provider Care Teams.  These Care Teams include your primary Cardiologist (physician) and Advanced Practice Providers (APPs -  Physician Assistants and Nurse Practitioners) who all work together to provide you with the care you need, when you need it. You will need a follow up appointment in: 6-8 weeks.  Please call our office 2 months in advance to schedule this appointment.  You may see Mertie Moores, MD or Richardson Dopp, PA-C   Any Other Special Instructions Will Be Listed Below (If Applicable).  If you do not have improved swelling or reduced weight with taking Lasix for 3 days, call our office.

## 2018-10-05 ENCOUNTER — Other Ambulatory Visit: Payer: Self-pay

## 2018-10-05 ENCOUNTER — Inpatient Hospital Stay: Payer: Medicare Other | Attending: Oncology

## 2018-10-05 DIAGNOSIS — D509 Iron deficiency anemia, unspecified: Secondary | ICD-10-CM | POA: Diagnosis present

## 2018-10-05 DIAGNOSIS — D508 Other iron deficiency anemias: Secondary | ICD-10-CM

## 2018-10-05 LAB — CBC WITH DIFFERENTIAL/PLATELET
Abs Immature Granulocytes: 0 10*3/uL (ref 0.00–0.07)
Basophils Absolute: 0 10*3/uL (ref 0.0–0.1)
Basophils Relative: 1 %
Eosinophils Absolute: 0.1 10*3/uL (ref 0.0–0.5)
Eosinophils Relative: 3 %
HCT: 40.7 % (ref 39.0–52.0)
Hemoglobin: 11.9 g/dL — ABNORMAL LOW (ref 13.0–17.0)
Immature Granulocytes: 0 %
Lymphocytes Relative: 26 %
Lymphs Abs: 0.7 10*3/uL (ref 0.7–4.0)
MCH: 26.6 pg (ref 26.0–34.0)
MCHC: 29.2 g/dL — ABNORMAL LOW (ref 30.0–36.0)
MCV: 91.1 fL (ref 80.0–100.0)
Monocytes Absolute: 0.4 10*3/uL (ref 0.1–1.0)
Monocytes Relative: 15 %
Neutro Abs: 1.5 10*3/uL — ABNORMAL LOW (ref 1.7–7.7)
Neutrophils Relative %: 55 %
Platelets: 122 10*3/uL — ABNORMAL LOW (ref 150–400)
RBC: 4.47 MIL/uL (ref 4.22–5.81)
RDW: 28.9 % — ABNORMAL HIGH (ref 11.5–15.5)
WBC: 2.7 10*3/uL — ABNORMAL LOW (ref 4.0–10.5)
nRBC: 0 % (ref 0.0–0.2)

## 2018-10-05 LAB — IRON AND TIBC
Iron: 48 ug/dL (ref 42–163)
Saturation Ratios: 16 % — ABNORMAL LOW (ref 20–55)
TIBC: 299 ug/dL (ref 202–409)
UIBC: 251 ug/dL (ref 117–376)

## 2018-10-05 LAB — FERRITIN: Ferritin: 71 ng/mL (ref 24–336)

## 2018-10-13 ENCOUNTER — Telehealth (HOSPITAL_COMMUNITY): Payer: Self-pay | Admitting: Cardiology

## 2018-10-13 NOTE — Telephone Encounter (Signed)

## 2018-10-16 ENCOUNTER — Ambulatory Visit (HOSPITAL_COMMUNITY): Payer: Medicare Other | Attending: Cardiology

## 2018-10-16 ENCOUNTER — Other Ambulatory Visit: Payer: Self-pay

## 2018-10-16 DIAGNOSIS — I4819 Other persistent atrial fibrillation: Secondary | ICD-10-CM | POA: Diagnosis present

## 2018-10-16 DIAGNOSIS — R0602 Shortness of breath: Secondary | ICD-10-CM | POA: Diagnosis present

## 2018-10-17 ENCOUNTER — Encounter: Payer: Self-pay | Admitting: Physician Assistant

## 2018-11-28 ENCOUNTER — Telehealth: Payer: Self-pay | Admitting: Cardiovascular Disease

## 2018-11-28 ENCOUNTER — Encounter: Payer: Self-pay | Admitting: Cardiovascular Disease

## 2018-11-28 ENCOUNTER — Other Ambulatory Visit: Payer: Self-pay

## 2018-11-28 ENCOUNTER — Ambulatory Visit (INDEPENDENT_AMBULATORY_CARE_PROVIDER_SITE_OTHER): Payer: Medicare Other | Admitting: Cardiovascular Disease

## 2018-11-28 VITALS — BP 140/100 | HR 56 | Ht 71.0 in | Wt 178.6 lb

## 2018-11-28 DIAGNOSIS — I482 Chronic atrial fibrillation, unspecified: Secondary | ICD-10-CM | POA: Diagnosis not present

## 2018-11-28 DIAGNOSIS — I340 Nonrheumatic mitral (valve) insufficiency: Secondary | ICD-10-CM

## 2018-11-28 DIAGNOSIS — I10 Essential (primary) hypertension: Secondary | ICD-10-CM

## 2018-11-28 DIAGNOSIS — I4819 Other persistent atrial fibrillation: Secondary | ICD-10-CM

## 2018-11-28 MED ORDER — HYDROCHLOROTHIAZIDE 25 MG PO TABS: 25.0000 mg | ORAL_TABLET | Freq: Every day | ORAL | 3 refills | Status: DC

## 2018-11-28 MED ORDER — POTASSIUM CHLORIDE ER 10 MEQ PO TBCR: 10.0000 meq | EXTENDED_RELEASE_TABLET | Freq: Every day | ORAL | 3 refills | Status: DC
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
## 2018-11-28 NOTE — Telephone Encounter (Signed)
New Message ° ° ° ° °Left message to confirm appt and answer COVID Questions  °

## 2018-11-28 NOTE — Patient Instructions (Addendum)
Medication Instructions:  Your physician has recommended you make the following change in your medication:  START HCTZ (Hydrochlorothiazide) 25 mg once daily  START Kdur (Potassium chloride) 10 mEq once daily  If you need a refill on your cardiac medications before your next appointment, please call your pharmacy.    Lab work: Your physician recommends that you return for lab work in: 3 weeks on Tuesday August 4 You may come in anytime between 7:30 am and 4:30 pm for your lab work You do not have to fast  If you have labs (blood work) drawn today and your tests are completely normal, you will receive your results only by: [name] MyChart Message (if you have MyChart) OR . A paper copy in the mail If you have any lab test that is abnormal or we need to change your treatment, we will call you to review the results.  Testing/Procedures: None Ordered   Follow-Up: At [facility], you and your health needs are our priority.  As part of our continuing mission to provide you with exceptional heart care, we have created designated Provider Care Teams.  These Care Teams include your primary Cardiologist (physician) and Advanced Practice Providers (APPs -  Physician Assistants and Nurse Practitioners) who all work together to provide you with the care you need, when you need it. You will need a follow up appointment in:  6 months.  Please call our office 2 months in advance to schedule this appointment.  You may see Mertie Moores, MD or one of the following Advanced Practice Providers on your designated Care Team: Richardson Dopp, PA-C [place], [place] . Daune Perch, NP   DASH Eating Plan DASH stands for "Dietary Approaches to Stop Hypertension." The DASH eating plan is a healthy eating plan that has been shown to reduce high blood pressure (hypertension). It may also reduce your risk for type 2 diabetes, heart disease, and stroke. The DASH eating plan may also help with weight loss. What are tips  for following this plan?  General guidelines  Avoid eating more than 2,300 mg (milligrams) of salt (sodium) a day. If you have hypertension, you may need to reduce your sodium intake to 1,500 mg a day.  Limit alcohol intake to no more than 1 drink a day for nonpregnant women and 2 drinks a day for men. One drink equals 12 oz of beer, 5 oz of wine, or 1 oz of hard liquor.  Work with your health care provider to maintain a healthy body weight or to lose weight. Ask what an ideal weight is for you.  Get at least 30 minutes of exercise that causes your heart to beat faster (aerobic exercise) most days of the week. Activities may include walking, swimming, or biking.  Work with your health care provider or diet and nutrition specialist (dietitian) to adjust your eating plan to your individual calorie needs. Reading food labels   Check food labels for the amount of sodium per serving. Choose foods with less than 5 percent of the Daily Value of sodium. Generally, foods with less than 300 mg of sodium per serving fit into this eating plan.  To find whole grains, look for the word "whole" as the first word in the ingredient list. Shopping  Buy products labeled as "low-sodium" or "no salt added."  Buy fresh foods. Avoid canned foods and premade or frozen meals. Cooking  Avoid adding salt when cooking. Use salt-free seasonings or herbs instead of table salt or sea salt.  Check with your health care provider or pharmacist before using salt substitutes.  Do not fry foods. Cook foods using healthy methods such as baking, boiling, grilling, and broiling instead.  Cook with heart-healthy oils, such as olive, canola, soybean, or sunflower oil. Meal planning  Eat a balanced diet that includes: ? 5 or more servings of fruits and vegetables each day. At each meal, try to fill half of your plate with fruits and vegetables. ? Up to 6-8 servings of whole grains each day. ? Less than 6 oz of lean meat,  poultry, or fish each day. A 3-oz serving of meat is about the same size as a deck of cards. One egg equals 1 oz. ? 2 servings of low-fat dairy each day. ? A serving of nuts, seeds, or beans 5 times each week. ? Heart-healthy fats. Healthy fats called Omega-3 fatty acids are found in foods such as flaxseeds and coldwater fish, like sardines, salmon, and mackerel.  Limit how much you eat of the following: ? Canned or prepackaged foods. ? Food that is high in trans fat, such as fried foods. ? Food that is high in saturated fat, such as fatty meat. ? Sweets, desserts, sugary drinks, and other foods with added sugar. ? Full-fat dairy products.  Do not salt foods before eating.  Try to eat at least 2 vegetarian meals each week.  Eat more home-cooked food and less restaurant, buffet, and fast food.  When eating at a restaurant, ask that your food be prepared with less salt or no salt, if possible. What foods are recommended? The items listed may not be a complete list. Talk with your dietitian about what dietary choices are best for you. Grains Whole-grain or whole-wheat bread. Whole-grain or whole-wheat pasta. Brown rice. [place]. Bulgur. Whole-grain and low-sodium cereals. Pita bread. Low-fat, low-sodium crackers. Whole-wheat flour tortillas. Vegetables Fresh or frozen vegetables (raw, steamed, roasted, or grilled). Low-sodium or reduced-sodium tomato and vegetable juice. Low-sodium or reduced-sodium tomato sauce and tomato paste. Low-sodium or reduced-sodium canned vegetables. Fruits All fresh, dried, or frozen fruit. Canned fruit in natural juice (without added sugar). Meat and other protein foods Skinless chicken or [place]. Ground chicken or [place]. Pork with fat trimmed off. Fish and seafood. Egg whites. Dried beans, peas, or lentils. Unsalted nuts, nut butters, and seeds. Unsalted canned beans. Lean cuts of beef with fat trimmed off. Low-sodium, lean deli meat. Dairy Low-fat  (1%) or fat-free (skim) milk. Fat-free, low-fat, or reduced-fat cheeses. Nonfat, low-sodium ricotta or cottage cheese. Low-fat or nonfat yogurt. Low-fat, low-sodium cheese. Fats and oils Soft margarine without trans fats. Vegetable oil. Low-fat, reduced-fat, or light mayonnaise and salad dressings (reduced-sodium). Canola, safflower, olive, soybean, and sunflower oils. Avocado. Seasoning and other foods Herbs. Spices. Seasoning mixes without salt. Unsalted popcorn and pretzels. Fat-free sweets. What foods are not recommended? The items listed may not be a complete list. Talk with your dietitian about what dietary choices are best for you. Grains Baked goods made with fat, such as croissants, muffins, or some breads. Dry pasta or rice meal packs. Vegetables Creamed or fried vegetables. Vegetables in a cheese sauce. Regular canned vegetables (not low-sodium or reduced-sodium). Regular canned tomato sauce and paste (not low-sodium or reduced-sodium). Regular tomato and vegetable juice (not low-sodium or reduced-sodium). Angie Fava. Olives. Fruits Canned fruit in a light or heavy syrup. Fried fruit. Fruit in cream or butter sauce. Meat and other protein foods Fatty cuts of meat. Ribs. Fried meat. Berniece Salines. Sausage. Bologna and other processed  lunch meats. Salami. Fatback. Hotdogs. Bratwurst. Salted nuts and seeds. Canned beans with added salt. Canned or smoked fish. Whole eggs or egg yolks. Chicken or [place] with skin. Dairy Whole or 2% milk, cream, and half-and-half. Whole or full-fat cream cheese. Whole-fat or sweetened yogurt. Full-fat cheese. Nondairy creamers. Whipped toppings. Processed cheese and cheese spreads. Fats and oils Butter. Stick margarine. Lard. Shortening. Ghee. Bacon fat. Tropical oils, such as coconut, palm kernel, or palm oil. Seasoning and other foods Salted popcorn and pretzels. Onion salt, garlic salt, seasoned salt, table salt, and sea salt. Worcestershire sauce. Tartar sauce.  Barbecue sauce. Teriyaki sauce. Soy sauce, including reduced-sodium. Steak sauce. Canned and packaged gravies. Fish sauce. Oyster sauce. Cocktail sauce. Horseradish that you find on the shelf. Ketchup. Mustard. Meat flavorings and tenderizers. Bouillon cubes. Hot sauce and Tabasco sauce. Premade or packaged marinades. Premade or packaged taco seasonings. Relishes. Regular salad dressings. Where to find more information:  National Heart, Lung, and [place]: https://wilson-eaton.com/  American Heart Association: www.heart.org Summary  The DASH eating plan is a healthy eating plan that has been shown to reduce high blood pressure (hypertension). It may also reduce your risk for type 2 diabetes, heart disease, and stroke.  With the DASH eating plan, you should limit salt (sodium) intake to 2,300 mg a day. If you have hypertension, you may need to reduce your sodium intake to 1,500 mg a day.  When on the DASH eating plan, aim to eat more fresh fruits and vegetables, whole grains, lean proteins, low-fat dairy, and heart-healthy fats.  Work with your health care provider or diet and nutrition specialist (dietitian) to adjust your eating plan to your individual calorie needs. This information is not intended to replace advice given to you by your health care provider. Make sure you discuss any questions you have with your health care provider. Document Released: 04/22/2011 Document Revised: 04/15/2017 Document Reviewed: 04/26/2016 Elsevier Patient Education  2020 [employer].

## 2018-12-19 ENCOUNTER — Other Ambulatory Visit: Payer: Medicare Other

## 2018-12-19 ENCOUNTER — Other Ambulatory Visit: Payer: Self-pay

## 2018-12-19 DIAGNOSIS — I4819 Other persistent atrial fibrillation: Secondary | ICD-10-CM

## 2018-12-19 DIAGNOSIS — I10 Essential (primary) hypertension: Secondary | ICD-10-CM

## 2018-12-19 DIAGNOSIS — I340 Nonrheumatic mitral (valve) insufficiency: Secondary | ICD-10-CM

## 2018-12-19 LAB — BASIC METABOLIC PANEL
BUN/Creatinine Ratio: 18 (ref 10–24)
BUN: 19 mg/dL (ref 8–27)
CO2: 24 mmol/L (ref 20–29)
Calcium: 9.4 mg/dL (ref 8.6–10.2)
Chloride: 99 mmol/L (ref 96–106)
Creatinine, Ser: 1.05 mg/dL (ref 0.76–1.27)
GFR calc Af Amer: 75 mL/min/{1.73_m2} (ref >59)
GFR calc non Af Amer: 65 mL/min/{1.73_m2} (ref >59)
Glucose: 56 mg/dL — ABNORMAL LOW (ref 65–99)
Potassium: 4.1 mmol/L (ref 3.5–5.2)
Sodium: 139 mmol/L (ref 134–144)

## 2018-12-28 ENCOUNTER — Other Ambulatory Visit: Payer: Self-pay

## 2018-12-28 ENCOUNTER — Other Ambulatory Visit: Payer: Self-pay | Admitting: Oncology

## 2018-12-28 ENCOUNTER — Inpatient Hospital Stay: Payer: Medicare Other | Attending: Oncology

## 2018-12-28 ENCOUNTER — Encounter: Payer: Self-pay | Admitting: Oncology

## 2018-12-28 DIAGNOSIS — D509 Iron deficiency anemia, unspecified: Secondary | ICD-10-CM | POA: Diagnosis present

## 2018-12-28 DIAGNOSIS — D508 Other iron deficiency anemias: Secondary | ICD-10-CM

## 2018-12-28 LAB — CBC WITH DIFFERENTIAL/PLATELET
Abs Immature Granulocytes: 0 10*3/uL (ref 0.00–0.07)
Basophils Absolute: 0 10*3/uL (ref 0.0–0.1)
Basophils Relative: 0 %
Eosinophils Absolute: 0.1 10*3/uL (ref 0.0–0.5)
Eosinophils Relative: 3 %
HCT: 39.9 % (ref 39.0–52.0)
Hemoglobin: 13.2 g/dL (ref 13.0–17.0)
Immature Granulocytes: 0 %
Lymphocytes Relative: 30 %
Lymphs Abs: 0.8 10*3/uL (ref 0.7–4.0)
MCH: 30.3 pg (ref 26.0–34.0)
MCHC: 33.1 g/dL (ref 30.0–36.0)
MCV: 91.7 fL (ref 80.0–100.0)
Monocytes Absolute: 0.5 10*3/uL (ref 0.1–1.0)
Monocytes Relative: 18 %
Neutro Abs: 1.4 10*3/uL — ABNORMAL LOW (ref 1.7–7.7)
Neutrophils Relative %: 49 %
Platelets: 111 10*3/uL — ABNORMAL LOW (ref 150–400)
RBC: 4.35 MIL/uL (ref 4.22–5.81)
RDW: 13.3 % (ref 11.5–15.5)
WBC: 2.8 10*3/uL — ABNORMAL LOW (ref 4.0–10.5)
nRBC: 0 % (ref 0.0–0.2)

## 2018-12-28 LAB — FERRITIN: Ferritin: 39 ng/mL (ref 24–336)

## 2018-12-28 LAB — IRON AND TIBC
Iron: 78 ug/dL (ref 42–163)
Saturation Ratios: 26 % (ref 20–55)
TIBC: 305 ug/dL (ref 202–409)
UIBC: 227 ug/dL (ref 117–376)

## 2019-03-08 ENCOUNTER — Ambulatory Visit (HOSPITAL_COMMUNITY)
Admission: RE | Admit: 2019-03-08 | Discharge: 2019-03-08 | Disposition: A | Discharge status: Home / Self Care | Payer: Medicare Other | Source: Ambulatory Visit | Attending: Physician Assistant | Admitting: Physician Assistant

## 2019-03-08 ENCOUNTER — Other Ambulatory Visit: Payer: Self-pay

## 2019-03-08 VITALS — BP 150/82 | HR 75 | Ht 71.0 in | Wt 173.6 lb

## 2019-03-08 DIAGNOSIS — R9431 Abnormal electrocardiogram [ECG] [EKG]: Secondary | ICD-10-CM | POA: Insufficient documentation

## 2019-03-08 DIAGNOSIS — Z950 Presence of cardiac pacemaker: Secondary | ICD-10-CM | POA: Insufficient documentation

## 2019-03-08 DIAGNOSIS — I4821 Permanent atrial fibrillation: Secondary | ICD-10-CM | POA: Insufficient documentation

## 2019-03-08 DIAGNOSIS — Z803 Family history of malignant neoplasm of breast: Secondary | ICD-10-CM | POA: Diagnosis not present

## 2019-03-08 DIAGNOSIS — Z905 Acquired absence of kidney: Secondary | ICD-10-CM | POA: Insufficient documentation

## 2019-03-08 DIAGNOSIS — Z8249 Family history of ischemic heart disease and other diseases of the circulatory system: Secondary | ICD-10-CM | POA: Insufficient documentation

## 2019-03-08 DIAGNOSIS — D6869 Other thrombophilia: Secondary | ICD-10-CM | POA: Diagnosis not present

## 2019-03-08 DIAGNOSIS — Z7901 Long term (current) use of anticoagulants: Secondary | ICD-10-CM | POA: Diagnosis not present

## 2019-03-08 DIAGNOSIS — I4510 Unspecified right bundle-branch block: Secondary | ICD-10-CM | POA: Diagnosis not present

## 2019-03-08 DIAGNOSIS — Z79899 Other long term (current) drug therapy: Secondary | ICD-10-CM | POA: Diagnosis not present

## 2019-03-08 DIAGNOSIS — I340 Nonrheumatic mitral (valve) insufficiency: Secondary | ICD-10-CM | POA: Insufficient documentation

## 2019-03-08 DIAGNOSIS — D509 Iron deficiency anemia, unspecified: Secondary | ICD-10-CM | POA: Insufficient documentation

## 2019-03-08 DIAGNOSIS — I10 Essential (primary) hypertension: Secondary | ICD-10-CM | POA: Diagnosis not present

## 2019-03-08 NOTE — Patient Instructions (Signed)
Follow-up in Afib clinic as needed.

## 2019-03-08 NOTE — Progress Notes (Signed)
Primary Care Physician: Burnard Bunting, MD Primary Cardiologist: Dr Acie Fredrickson Primary Electrophysiologist: none Referring Physician: Dr Stark Jock [name] is a 85 y.o. male with a history of persistent atrial fibrillation, HTN, MR and iron deficiency anemia who presents for follow up in the [facility]. Patient was hospitalized 08/2018 after presented to the ER with SOB. He was found to have a Hgb of 6.0. He was transfused PRBC and faraheme. GI workup unremarkable and he was cleared to resume Eliquis for a CHADS2VASC score of 3. He reports that he has felt much better since his hospitalization. He is back to his baseline with no fatigue or SOB. He is playing tennis again. He remains unaware of his arrhythmia.   Today, he denies symptoms of palpitations, chest pain, shortness of breath, orthopnea, PND, lower extremity edema, dizziness, presyncope, syncope, snoring, daytime somnolence, bleeding, or neurologic sequela. The patient is tolerating medications without difficulties and is otherwise without complaint today.    Atrial Fibrillation Risk Factors:  he does not have symptoms or diagnosis of sleep apnea. he does not have a history of rheumatic fever. he does not have a history of alcohol use. The patient does not have a history of early familial atrial fibrillation or other arrhythmias.  he has a BMI of Body mass index is 24.21 kg/m.[name] Filed Weights   03/08/19 1443  Weight: 78.7 kg    Family History  Problem Relation Age of Onset  . Breast cancer Mother   . Hypertension Father   . Diabetes Neg Hx   . Stroke Neg Hx   . CAD Neg Hx      Atrial Fibrillation Management history:  Previous antiarrhythmic drugs: none Previous cardioversions: 2017 Previous ablations: none CHADS2VASC score: 3 Anticoagulation history: Eliquis   Past Medical History:  Diagnosis Date  . Hypertension   . Iron deficiency anemia    amdx 08/2018 >> Tx with PRBCs and  Feraheme started; no GI source per workup; followed by Heme (Dr. Jana Hakim) and GI (Dr. Cristina Gong)   . Left renal mass    s/p L nephrectomy  . Mitral regurgitation    Echo 02/2016:  Mild conc LVH, EF 55-60, mild to mod MR, mod LAE, mod to severe RAE, mod TR, PASP 41 // Echo 10/2018: EF 60-65, mod conc LVH, severe BAE, trivial eff, mild MVP with mod MR, mod to severe TR, asc aorta 36 (mild dilation)   . Persistent Atrial Fibrillation    Past Surgical History:  Procedure Laterality Date  . CARDIOVERSION N/A 02/04/2016   Procedure: CARDIOVERSION;  Surgeon: Thayer Headings, MD;  Location: [facility];  Service: Cardiovascular;  Laterality: N/A;  . COLONOSCOPY WITH PROPOFOL N/A 09/10/2018   Procedure: COLONOSCOPY WITH PROPOFOL;  Surgeon: Ronald Lobo, MD;  Location: [facility];  Service: Endoscopy;  Laterality: N/A;  . ESOPHAGOGASTRODUODENOSCOPY (EGD) WITH PROPOFOL N/A 09/10/2018   Procedure: ESOPHAGOGASTRODUODENOSCOPY (EGD) WITH PROPOFOL;  Surgeon: Ronald Lobo, MD;  Location: [facility];  Service: Endoscopy;  Laterality: N/A;  . GIVENS CAPSULE STUDY N/A 09/10/2018   Procedure: GIVENS CAPSULE STUDY;  Surgeon: Ronald Lobo, MD;  Location: [facility];  Service: Endoscopy;  Laterality: N/A;    Current Outpatient Medications  Medication Sig Dispense Refill  . ALPRAZolam (XANAX) 0.25 MG tablet Take 0.25 mg by mouth as needed for anxiety.   1  . Apoaequorin (PREVAGEN EXTRA STRENGTH PO) Takes once daily    . Bacillus Coagulans-Inulin (PROBIOTIC) 1-250 BILLION-MG CAPS     .  Barberry-Oreg Grape-Goldenseal (BERBERINE COMPLEX PO) Take 600 mg by mouth daily. Takes 2 capsule once daily    . Black Pepper-Turmeric (TURMERIC CURCUMIN) 09-998 MG CAPS Takes one capsule twice daily    . Calcium-Phosphorus-Vitamin D (CITRACAL +D3 PO) Takes two tablets once daily    . Chromium Picolinate 400 MCG TABS Takes once tablet twice daily    . Coenzyme Q10 (CO Q10) 200 MG CAPS Takes one capsule once daily    .  ELIQUIS 5 MG TABS tablet Take 5 mg by mouth 2 (two) times daily.     . ferrous gluconate (FERGON) 324 MG tablet Take 324 mg by mouth daily with breakfast.    . Garlic 123XX123 MG CAPS Takes one capsule twice daily    . glucosamine-chondroitin 500-400 MG tablet Take 1 tablet by mouth 2 (two) times daily.     . hydrochlorothiazide (HYDRODIURIL) 25 MG tablet Take 1 tablet (25 mg total) by mouth daily. (Patient taking differently: Take 25 mg by mouth. Takes one tablet 3-4 times a week) 90 tablet 3  . Linoleic Acid Conjugated 1000 MG CAPS Takes one capsule twice daily    . losartan (COZAAR) 50 MG tablet Take 1 tablet (50 mg total) by mouth daily. 90 tablet 1  . Magnesium Glycinate 665 MG CAPS Takes one capsule twice daily    . Melatonin 5 MG TABS Takes one tablet at bedtime    . metoprolol succinate (TOPROL-XL) 50 MG 24 hr tablet Take 1 tablet (50 mg total) by mouth 2 (two) times a day. Take with or immediately following a meal. 180 tablet 3  . Multiple Vitamin (MULTIVITAMIN) capsule Take 1 capsule by mouth daily. Takes two tablets once daily    . omeprazole (PRILOSEC) 40 MG capsule Take 1 capsule (40 mg total) by mouth daily. 90 capsule 3  . potassium chloride (K-DUR) 10 MEQ tablet Take 1 tablet (10 mEq total) by mouth daily. 90 tablet 3  . omeprazole (PRILOSEC) 20 MG capsule Take 20 mg by mouth daily.     No current facility-administered medications for this encounter.     No Known Allergies  Social History   Socioeconomic History  . Marital status: Widowed    Spouse name: Not on file  . Number of children: Not on file  . Years of education: Not on file  . Highest education level: Not on file  Occupational History  . Not on file  Social Needs  . Financial resource strain: Not on file  . Food insecurity    Worry: Not on file    Inability: Not on file  . Transportation needs    Medical: Not on file    Non-medical: Not on file  Tobacco Use  . Smoking status: Never Smoker  . Smokeless  tobacco: Never Used  Substance and Sexual Activity  . Alcohol use: Yes    Alcohol/week: 0.0 standard drinks    Comment: occasional  . Drug use: No  . Sexual activity: Not on file  Lifestyle  . Physical activity    Days per week: Not on file    Minutes per session: Not on file  . Stress: Not on file  Relationships  . Social Herbalist on phone: Not on file    Gets together: Not on file    Attends religious service: Not on file    Active member of club or organization: Not on file    Attends meetings of clubs or organizations: Not on file  Relationship status: Not on file  . Intimate partner violence    Fear of current or ex partner: Not on file    Emotionally abused: Not on file    Physically abused: Not on file    Forced sexual activity: Not on file  Other Topics Concern  . Not on file  Social History Narrative  . Not on file     ROS- All systems are reviewed and negative except as per the HPI above.  Physical Exam: Vitals:   03/08/19 1443  BP: (!) 150/82  Pulse: 75  Weight: 78.7 kg  Height: 5\' 11"  (1.803 m)    GEN- The patient is well appearing, alert and oriented x 3 today.   Head- normocephalic, atraumatic Eyes-  Sclera clear, conjunctiva pink Ears- hearing intact Oropharynx- clear Neck- supple  Lungs- Clear to ausculation bilaterally, normal work of breathing Heart- irregular rate and rhythm, no murmurs, rubs or gallops  GI- soft, NT, ND, + BS Extremities- no clubbing, cyanosis, or edema MS- no significant deformity or atrophy Skin- no rash or lesion Psych- euthymic mood, full affect Neuro- strength and sensation are intact  Wt Readings from Last 3 Encounters:  03/08/19 78.7 kg  11/28/18 81 kg  09/20/18 81.6 kg    EKG today demonstrates afib HR 75, inc RBBB, QRS 118, QTc 484  Echo 10/16/18 demonstrated  1. The left ventricle has normal systolic function with an ejection fraction of 60-65%. The cavity size was normal. There is moderate  concentric left ventricular hypertrophy. Left ventricular diastolic function could not be evaluated secondary to atrial  fibrillation. No evidence of left ventricular regional wall motion abnormalities.  2. The right ventricle has normal systolic function. The cavity was normal. There is no increase in right ventricular wall thickness.  3. Left atrial size was severely dilated.  4. Severe biatrial enlargement, R>L.  5. Right atrial size was severely dilated.  6. Trivial pericardial effusion is present.  7. Mild mitral valve prolapse.  8. Mitral valve regurgitation is moderate by color flow Doppler. The MR jet is eccentric anteriorly directed.  9. Tricuspid valve regurgitation is moderate-severe. 10. The aortic valve has an indeterminate number of cusps. Mild thickening of the aortic valve. Mild calcification of the aortic valve. No stenosis of the aortic valve. 11. There is mild dilatation of the ascending aorta measuring 36 mm. 12. No shunting seen, but swirling color from TR jet cannot fully exclude a masked shunt.  SUMMARY   Severe biatrial enlargement, R>L, with moderate concentric LVH. Eccentric at least moderate MR and TR noted. While study did not have typical echo appearance of amyloid, it cannot be excluded on the basis of this study.  Epic records are reviewed at length today  Assessment and Plan:  1. Permanent atrial fibrillation Patient remains asymptomatic.  We discussed rate control today and patient in agreement given his age, severe biatrial enlargement, and paucity of symptoms.  Continue Eliquis 5 mg BID for a CHADS2VASC score of 3.  This patients CHA2DS2-VASc Score and unadjusted Ischemic Stroke Rate (% per year) is equal to 3.2 % stroke rate/year from a score of 3  Above score calculated as 1 point each if present [CHF, HTN, DM, Vascular=MI/PAD/Aortic Plaque, Age if 65-74, or Male] Above score calculated as 2 points each if present [Age > 75, or Stroke/TIA/TE]    2. HTN Mildly elevated today, patient brings in home BP log which shows excellent control.  No change today.    Follow up with  Dr Acie Fredrickson per recall. AF clinic as needed.   [facility] [address] [place], [place] [address] [phone] 03/08/2019 3:16 PM

## 2019-03-22 ENCOUNTER — Other Ambulatory Visit: Payer: Self-pay

## 2019-03-22 ENCOUNTER — Inpatient Hospital Stay: Payer: Medicare Other | Attending: Oncology

## 2019-03-22 ENCOUNTER — Encounter: Payer: Self-pay | Admitting: Oncology

## 2019-03-22 DIAGNOSIS — D509 Iron deficiency anemia, unspecified: Secondary | ICD-10-CM | POA: Insufficient documentation

## 2019-03-22 DIAGNOSIS — D508 Other iron deficiency anemias: Secondary | ICD-10-CM

## 2019-03-22 LAB — IRON AND TIBC
Iron: 87 ug/dL (ref 42–163)
Saturation Ratios: 29 % (ref 20–55)
TIBC: 304 ug/dL (ref 202–409)
UIBC: 217 ug/dL (ref 117–376)

## 2019-03-22 LAB — CBC WITH DIFFERENTIAL/PLATELET
Abs Immature Granulocytes: 0 10*3/uL (ref 0.00–0.07)
Basophils Absolute: 0 10*3/uL (ref 0.0–0.1)
Basophils Relative: 1 %
Eosinophils Absolute: 0.1 10*3/uL (ref 0.0–0.5)
Eosinophils Relative: 4 %
HCT: 44.1 % (ref 39.0–52.0)
Hemoglobin: 14.6 g/dL (ref 13.0–17.0)
Immature Granulocytes: 0 %
Lymphocytes Relative: 27 %
Lymphs Abs: 0.9 10*3/uL (ref 0.7–4.0)
MCH: 31.6 pg (ref 26.0–34.0)
MCHC: 33.1 g/dL (ref 30.0–36.0)
MCV: 95.5 fL (ref 80.0–100.0)
Monocytes Absolute: 0.6 10*3/uL (ref 0.1–1.0)
Monocytes Relative: 17 %
Neutro Abs: 1.7 10*3/uL (ref 1.7–7.7)
Neutrophils Relative %: 51 %
Platelets: 136 10*3/uL — ABNORMAL LOW (ref 150–400)
RBC: 4.62 MIL/uL (ref 4.22–5.81)
RDW: 13.5 % (ref 11.5–15.5)
WBC: 3.3 10*3/uL — ABNORMAL LOW (ref 4.0–10.5)
nRBC: 0 % (ref 0.0–0.2)

## 2019-03-22 LAB — FERRITIN: Ferritin: 32 ng/mL (ref 24–336)

## 2019-04-23 ENCOUNTER — Encounter: Payer: Self-pay | Admitting: Oncology

## 2019-04-24 ENCOUNTER — Encounter: Payer: Self-pay | Admitting: *Deleted

## 2019-05-21 ENCOUNTER — Other Ambulatory Visit: Payer: Self-pay | Admitting: Cardiovascular Disease

## 2019-05-25 ENCOUNTER — Encounter: Payer: Self-pay | Admitting: Oncology

## 2019-06-05 ENCOUNTER — Ambulatory Visit: Payer: Medicare PPO | Admitting: Cardiovascular Disease

## 2019-06-05 ENCOUNTER — Other Ambulatory Visit: Payer: Self-pay

## 2019-06-05 ENCOUNTER — Encounter: Payer: Self-pay | Admitting: Cardiovascular Disease

## 2019-06-05 VITALS — BP 112/78 | HR 57 | Ht 71.0 in | Wt 171.8 lb

## 2019-06-05 DIAGNOSIS — I10 Essential (primary) hypertension: Secondary | ICD-10-CM

## 2019-06-05 DIAGNOSIS — I482 Chronic atrial fibrillation, unspecified: Secondary | ICD-10-CM | POA: Diagnosis not present

## 2019-06-05 MED ORDER — POTASSIUM CHLORIDE ER 10 MEQ PO TBCR: 10.0000 meq | EXTENDED_RELEASE_TABLET | ORAL | 3 refills | Status: DC

## 2019-06-05 MED ORDER — HYDROCHLOROTHIAZIDE 25 MG PO TABS: 25.0000 mg | ORAL_TABLET | ORAL | 3 refills | Status: DC

## 2019-06-05 NOTE — Patient Instructions (Addendum)
Medication Instructions:  Your physician has recommended you make the following change in your medication:  DECREASE Kdur (potassium chloride) 10 mEq to 3 days per week DECREASE HCTZ (Hydrochlorothiazide) 25 mg to 3 days per week  *If you need a refill on your cardiac medications before your next appointment, please call your pharmacy*  Lab Work: None Ordered If you have labs (blood work) drawn today and your tests are completely normal, you will receive your results only by: [name] MyChart Message (if you have MyChart) OR . A paper copy in the mail If you have any lab test that is abnormal or we need to change your treatment, we will call you to review the results.   Testing/Procedures: None Ordered   Follow-Up: At [facility], you and your health needs are our priority.  As part of our continuing mission to provide you with exceptional heart care, we have created designated Provider Care Teams.  These Care Teams include your primary Cardiologist (physician) and Advanced Practice Providers (APPs -  Physician Assistants and Nurse Practitioners) who all work together to provide you with the care you need, when you need it.  Your next appointment:   6 month(s)  The format for your next appointment:   Either In Person or Virtual  Provider:    Richardson Dopp, PA-C
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
## 2019-06-14 ENCOUNTER — Inpatient Hospital Stay: Payer: Medicare PPO | Attending: Oncology

## 2019-06-14 ENCOUNTER — Other Ambulatory Visit: Payer: Self-pay | Admitting: Oncology

## 2019-06-14 ENCOUNTER — Other Ambulatory Visit: Payer: Self-pay

## 2019-06-14 DIAGNOSIS — D509 Iron deficiency anemia, unspecified: Secondary | ICD-10-CM | POA: Insufficient documentation

## 2019-06-14 DIAGNOSIS — D508 Other iron deficiency anemias: Secondary | ICD-10-CM

## 2019-06-14 LAB — CBC WITH DIFFERENTIAL/PLATELET
Abs Immature Granulocytes: 0 10*3/uL (ref 0.00–0.07)
Basophils Absolute: 0 10*3/uL (ref 0.0–0.1)
Basophils Relative: 1 %
Eosinophils Absolute: 0.1 10*3/uL (ref 0.0–0.5)
Eosinophils Relative: 5 %
HCT: 35.8 % — ABNORMAL LOW (ref 39.0–52.0)
Hemoglobin: 11.6 g/dL — ABNORMAL LOW (ref 13.0–17.0)
Immature Granulocytes: 0 %
Lymphocytes Relative: 28 %
Lymphs Abs: 0.8 10*3/uL (ref 0.7–4.0)
MCH: 30.7 pg (ref 26.0–34.0)
MCHC: 32.4 g/dL (ref 30.0–36.0)
MCV: 94.7 fL (ref 80.0–100.0)
Monocytes Absolute: 0.5 10*3/uL (ref 0.1–1.0)
Monocytes Relative: 17 %
Neutro Abs: 1.3 10*3/uL — ABNORMAL LOW (ref 1.7–7.7)
Neutrophils Relative %: 49 %
Platelets: 144 10*3/uL — ABNORMAL LOW (ref 150–400)
RBC: 3.78 MIL/uL — ABNORMAL LOW (ref 4.22–5.81)
RDW: 13.2 % (ref 11.5–15.5)
WBC: 2.7 10*3/uL — ABNORMAL LOW (ref 4.0–10.5)
nRBC: 0 % (ref 0.0–0.2)

## 2019-06-14 LAB — IRON AND TIBC
Iron: 38 ug/dL — ABNORMAL LOW (ref 42–163)
Saturation Ratios: 12 % — ABNORMAL LOW (ref 20–55)
TIBC: 330 ug/dL (ref 202–409)
UIBC: 292 ug/dL (ref 117–376)

## 2019-06-14 LAB — FERRITIN: Ferritin: 24 ng/mL (ref 24–336)

## 2019-06-14 NOTE — Progress Notes (Signed)
I called John Holder with his lab results which show some drop in his hemoglobin and ferritin.  He is going to increase his oral iron to twice daily.  He is having some epistaxis.  I will recheck his labs in 1 month.  He knows to call if anything else develops before then.

## 2019-06-19 ENCOUNTER — Telehealth: Payer: Self-pay | Admitting: Oncology

## 2019-06-19 NOTE — Telephone Encounter (Signed)
Scheduled appt per 12/8 sch message - unable to reach pt . Left message with appt date and time   

## 2019-07-12 ENCOUNTER — Inpatient Hospital Stay: Payer: Medicare PPO | Attending: Oncology

## 2019-07-12 ENCOUNTER — Other Ambulatory Visit: Payer: Self-pay

## 2019-07-12 DIAGNOSIS — D509 Iron deficiency anemia, unspecified: Secondary | ICD-10-CM | POA: Insufficient documentation

## 2019-07-12 DIAGNOSIS — D508 Other iron deficiency anemias: Secondary | ICD-10-CM

## 2019-07-12 LAB — CBC WITH DIFFERENTIAL/PLATELET
Abs Immature Granulocytes: 0.01 10*3/uL (ref 0.00–0.07)
Basophils Absolute: 0 10*3/uL (ref 0.0–0.1)
Basophils Relative: 1 %
Eosinophils Absolute: 0.1 10*3/uL (ref 0.0–0.5)
Eosinophils Relative: 3 %
HCT: 37.6 % — ABNORMAL LOW (ref 39.0–52.0)
Hemoglobin: 11.9 g/dL — ABNORMAL LOW (ref 13.0–17.0)
Immature Granulocytes: 0 %
Lymphocytes Relative: 25 %
Lymphs Abs: 0.8 10*3/uL (ref 0.7–4.0)
MCH: 29.8 pg (ref 26.0–34.0)
MCHC: 31.6 g/dL (ref 30.0–36.0)
MCV: 94 fL (ref 80.0–100.0)
Monocytes Absolute: 0.5 10*3/uL (ref 0.1–1.0)
Monocytes Relative: 15 %
Neutro Abs: 1.9 10*3/uL (ref 1.7–7.7)
Neutrophils Relative %: 56 %
Platelets: 123 10*3/uL — ABNORMAL LOW (ref 150–400)
RBC: 4 MIL/uL — ABNORMAL LOW (ref 4.22–5.81)
RDW: 13.6 % (ref 11.5–15.5)
WBC: 3.3 10*3/uL — ABNORMAL LOW (ref 4.0–10.5)
nRBC: 0 % (ref 0.0–0.2)

## 2019-07-12 LAB — IRON AND TIBC
Iron: 92 ug/dL (ref 42–163)
Saturation Ratios: 28 % (ref 20–55)
TIBC: 330 ug/dL (ref 202–409)
UIBC: 238 ug/dL (ref 117–376)

## 2019-07-12 LAB — FERRITIN: Ferritin: 27 ng/mL (ref 24–336)

## 2019-09-20 ENCOUNTER — Ambulatory Visit: Payer: Medicare Other | Admitting: Oncology

## 2019-09-20 ENCOUNTER — Other Ambulatory Visit: Payer: Medicare Other

## 2019-10-04 ENCOUNTER — Other Ambulatory Visit: Payer: Self-pay

## 2019-10-04 ENCOUNTER — Inpatient Hospital Stay: Payer: Medicare PPO | Attending: Oncology

## 2019-10-04 ENCOUNTER — Encounter: Payer: Self-pay | Admitting: Oncology

## 2019-10-04 ENCOUNTER — Inpatient Hospital Stay: Payer: Medicare PPO | Admitting: Oncology

## 2019-10-04 VITALS — BP 124/78 | HR 75 | Temp 98.9°F | Resp 20 | Ht 71.0 in | Wt 169.9 lb

## 2019-10-04 DIAGNOSIS — I10 Essential (primary) hypertension: Secondary | ICD-10-CM | POA: Diagnosis not present

## 2019-10-04 DIAGNOSIS — D649 Anemia, unspecified: Secondary | ICD-10-CM | POA: Diagnosis not present

## 2019-10-04 DIAGNOSIS — D696 Thrombocytopenia, unspecified: Secondary | ICD-10-CM | POA: Insufficient documentation

## 2019-10-04 DIAGNOSIS — D509 Iron deficiency anemia, unspecified: Secondary | ICD-10-CM | POA: Diagnosis present

## 2019-10-04 DIAGNOSIS — D508 Other iron deficiency anemias: Secondary | ICD-10-CM

## 2019-10-04 LAB — RETICULOCYTES
Immature Retic Fract: 18.9 % — ABNORMAL HIGH (ref 2.3–15.9)
RBC.: 3.3 MIL/uL — ABNORMAL LOW (ref 4.22–5.81)
Retic Count, Absolute: 89.4 10*3/uL (ref 19.0–186.0)
Retic Ct Pct: 2.7 % (ref 0.4–3.1)

## 2019-10-04 LAB — CBC WITH DIFFERENTIAL/PLATELET
Abs Immature Granulocytes: 0.01 10*3/uL (ref 0.00–0.07)
Basophils Absolute: 0 10*3/uL (ref 0.0–0.1)
Basophils Relative: 1 %
Eosinophils Absolute: 0.1 10*3/uL (ref 0.0–0.5)
Eosinophils Relative: 2 %
HCT: 31 % — ABNORMAL LOW (ref 39.0–52.0)
Hemoglobin: 9.7 g/dL — ABNORMAL LOW (ref 13.0–17.0)
Immature Granulocytes: 0 %
Lymphocytes Relative: 29 %
Lymphs Abs: 0.8 10*3/uL (ref 0.7–4.0)
MCH: 29.1 pg (ref 26.0–34.0)
MCHC: 31.3 g/dL (ref 30.0–36.0)
MCV: 93.1 fL (ref 80.0–100.0)
Monocytes Absolute: 0.5 10*3/uL (ref 0.1–1.0)
Monocytes Relative: 19 %
Neutro Abs: 1.3 10*3/uL — ABNORMAL LOW (ref 1.7–7.7)
Neutrophils Relative %: 49 %
Platelets: 151 10*3/uL (ref 150–400)
RBC: 3.33 MIL/uL — ABNORMAL LOW (ref 4.22–5.81)
RDW: 15.8 % — ABNORMAL HIGH (ref 11.5–15.5)
WBC: 2.7 10*3/uL — ABNORMAL LOW (ref 4.0–10.5)
nRBC: 0 % (ref 0.0–0.2)

## 2019-10-04 LAB — SAVE SMEAR(SSMR), FOR PROVIDER SLIDE REVIEW

## 2019-10-04 LAB — IRON AND TIBC
Iron: 19 ug/dL — ABNORMAL LOW (ref 42–163)
Saturation Ratios: 5 % — ABNORMAL LOW (ref 20–55)
TIBC: 348 ug/dL (ref 202–409)
UIBC: 329 ug/dL (ref 117–376)

## 2019-10-04 LAB — FERRITIN: Ferritin: 34 ng/mL (ref 24–336)

## 2019-10-04 NOTE — Progress Notes (Addendum)
[facility]  [phone]) [phone] [phone]) [phone]     ID: John Holder DOB: 1932-03-31  MR#: [id number]  [id number]  Patient Care Team: Burnard Bunting, MD as PCP - General (Internal Medicine) Nahser, Wonda Cheng, MD as PCP - Cardiology (Cardiology) Burnard Bunting, MD (Internal Medicine) Chauncey Cruel, MD OTHER MD:  CHIEF COMPLAINT: severe iron deficinecy anemia  CURRENT TREATMENT:  feraheme  INTERVAL HISTORY: John Holder" returns today for follow up of his iron deficiency anemia.  We have been following his hemoglobin every 3 months.  After iron replacement he did fine until January when it dropped to just over 11.  It stayed stable in February but now it is under 10.  The MCV has barely budged.  Iron studies today are pending.   REVIEW OF SYSTEMS: John Holder plays tennis twice a week.  He is very active physically also walking the dog daily.  Sometimes he feels a little short of breath going up hills but no chest pressure and no palpitations or dizziness.  He notices that if he does not take his diuretic he quickly gains fluid weight.  He is now taking his diuretics on a daily basis.  He had a slight change in bowel movements last week, not black or tarry but somewhat darker and somewhat sticky year he says.  They are now back to normal.  He is not aware of any other bleeding.  Recall he is on Eliquis.   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 He does have cardiac issues and brought this to the attention of his cardiologist Liam Rogers and his PCP Burnard Bunting. The patient was started on lasix empirically, w/o improvement.  Yesterday he presented to the ED with SOB symptoms and was found to have a Hb of 6.o with an MCV of 76.7. WBC was 2.6, platelets 156K. Note on 05/26/2018 Hb was 15.1 with a normal  MCV.  Admission labs showed retics 40.5, normal B-12 and folate, normal CRP, normal LDH and negative DAT. Ferritin however was 7 and iron saturation 2%  He was transfused and admitted for further evaluation  The patient's subsequent history is as detailed below.     PAST MEDICAL HISTORY: Past Medical History:  Diagnosis Date  . Hypertension   . Iron deficiency anemia    amdx 08/2018 >> Tx with PRBCs and Feraheme started; no GI source per workup; followed by Heme (Dr. Jana Hakim) and GI (Dr. Cristina Gong)   . Left renal mass    s/p L nephrectomy  . Mitral regurgitation    Echo 02/2016:  Mild conc LVH, EF 55-60, mild to mod MR, mod LAE, mod to severe RAE, mod TR, PASP 41 // Echo 10/2018: EF 60-65, mod conc LVH, severe BAE, trivial eff, mild MVP with mod MR, mod to severe TR, asc aorta 36 (mild dilation)   . Persistent Atrial Fibrillation     PAST SURGICAL HISTORY: Past Surgical History:  Procedure Laterality Date  . CARDIOVERSION N/A 02/04/2016   Procedure: CARDIOVERSION;  Surgeon: Thayer Headings, MD;  Location: [facility];  Service: Cardiovascular;  Laterality: N/A;  . COLONOSCOPY WITH PROPOFOL N/A 09/10/2018   Procedure: COLONOSCOPY WITH PROPOFOL;  Surgeon: Ronald Lobo, MD;  [facility];  Service: Endoscopy;  Laterality: N/A;  . ESOPHAGOGASTRODUODENOSCOPY (EGD) WITH PROPOFOL N/A 09/10/2018  Procedure: ESOPHAGOGASTRODUODENOSCOPY (EGD) WITH PROPOFOL;  Surgeon: Ronald Lobo, MD;  [facility];  Service: Endoscopy;  Laterality: N/A;  . GIVENS CAPSULE STUDY N/A 09/10/2018   Procedure: GIVENS CAPSULE STUDY;  Surgeon: Ronald Lobo, MD;  [facility];  Service: Endoscopy;  Laterality: N/A;    FAMILY HISTORY: Family History  Problem Relation Age of Onset  . Breast cancer Mother   . Hypertension Father   . Diabetes Neg Hx   . Stroke Neg Hx   . 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 Recently moved, with "lots of exercise carrying boxes."    ADVANCED DIRECTIVES: not in place; plans to name Ms. Aichele as his HCPOA with his son John Holder as secondary; John Holder lives in [place] and works as a Geophysicist/field seismologist   HEALTH MAINTENANCE: Social History   Tobacco Use  . Smoking status: Never Smoker  . Smokeless tobacco: Never Used  Substance Use Topics  . Alcohol use: Yes    Alcohol/week: 0.0 standard drinks    Comment: occasional  . Drug use: No     Colonoscopy: [facility], "about 2013;" does not recall MD's name  Bone density:    No Known Allergies  Current Outpatient Medications  Medication Sig Dispense Refill  . ALPRAZolam (XANAX) 0.25 MG tablet Take 0.25 mg by mouth as needed for anxiety.   1  . Apoaequorin (PREVAGEN EXTRA STRENGTH PO) Takes once daily    . Bacillus Coagulans-Inulin (PROBIOTIC) 1-250 BILLION-MG CAPS     . Barberry-Oreg Grape-Goldenseal (BERBERINE COMPLEX PO) Take 600 mg by mouth daily. Takes 2 capsule once daily    . Black Pepper-Turmeric (TURMERIC CURCUMIN) 09-998 MG CAPS Takes one capsule twice daily    . Calcium-Phosphorus-Vitamin D (CITRACAL +D3 PO) Takes two tablets once daily    . Chromium Picolinate 400 MCG TABS Takes once tablet twice daily    . Coenzyme Q10 (CO Q10) 200 MG CAPS Takes one capsule once daily    . ELIQUIS 5 MG TABS tablet Take 5 mg by mouth 2 (two) times daily.     . ferrous gluconate (FERGON) 324 MG tablet Take 324 mg by mouth daily with breakfast.    . Garlic 123XX123 MG CAPS Takes one capsule twice daily    . glucosamine-chondroitin 500-400 MG tablet Take 1 tablet by mouth 2 (two) times daily.     . hydrochlorothiazide (HYDRODIURIL) 25 MG tablet Take 1 tablet (25 mg total) by mouth 3 (three) times a week. 45 tablet 3  . Linoleic Acid Conjugated 1000 MG CAPS Takes one capsule twice daily    . losartan (COZAAR) 50 MG tablet TAKE 1 TABLET BY MOUTH EVERY DAY 90 tablet 3  .  Magnesium Glycinate 665 MG CAPS Takes one capsule twice daily    . Melatonin 5 MG TABS Takes one tablet at bedtime    . metoprolol succinate (TOPROL-XL) 50 MG 24 hr tablet Take 1 tablet (50 mg total) by mouth 2 (two) times a day. Take with or immediately following a meal. 180 tablet 3  . Multiple Vitamin (MULTIVITAMIN) capsule Take 1 capsule by mouth daily. Takes two tablets once daily    . omeprazole (PRILOSEC) 20 MG capsule Take 20 mg by mouth daily.    . potassium chloride (KLOR-CON) 10 MEQ tablet Take 1 tablet (10 mEq total) by mouth 3 (three) times a week. 45 tablet  3   No current facility-administered medications for this visit.    OBJECTIVE: White man in no acute distress  Vitals:   10/04/19 1125  BP: 124/78  Pulse: 75  Resp: 20  Temp: 98.9 F (37.2 C)  SpO2: 100%     Body mass index is 23.7 kg/m.   Wt Readings from Last 3 Encounters:  10/04/19 169 lb 14.4 oz (77.1 kg)  06/05/19 171 lb 12.8 oz (77.9 kg)  03/08/19 173 lb 9.6 oz (78.7 kg)      ECOG FS:1 - Symptomatic but completely ambulatory  Ocular: Sclerae unicteric, pupils round and equal Ear-nose-throat: Wearing a mask Lymphatic: No cervical or supraclavicular adenopathy Lungs no rales or rhonchi Heart regular rate and rhythm Abd soft, nontender, positive bowel sounds MSK no focal spinal tenderness, no joint edema Neuro: non-focal, well-oriented, appropriate affect   LAB RESULTS:  CMP     Component Value Date/Time   NA 139 12/19/2018 1120   K 4.1 12/19/2018 1120   CL 99 12/19/2018 1120   CO2 24 12/19/2018 1120   GLUCOSE 56 (L) 12/19/2018 1120   GLUCOSE 108 (H) 09/12/2018 0635   BUN 19 12/19/2018 1120   CREATININE 1.05 12/19/2018 1120   CREATININE 1.01 02/02/2016 1542   CALCIUM 9.4 12/19/2018 1120   PROT 5.7 (L) 09/09/2018 0532   ALBUMIN 3.7 09/09/2018 0532   AST 24 09/09/2018 0532   ALT 16 09/09/2018 0532   ALKPHOS 57 09/09/2018 0532   BILITOT 1.4 (H) 09/09/2018 0532   GFRNONAA 65 12/19/2018 1120    GFRAA 75 12/19/2018 1120    Lab Results  Component Value Date   TOTALPROTELP 5.2 (L) 09/09/2018   ALBUMINELP 3.4 09/09/2018   A1GS 0.2 09/09/2018   A2GS 0.5 09/09/2018   BETS 0.6 (L) 09/09/2018   GAMS 0.5 09/09/2018   MSPIKE Not Observed 09/09/2018   SPEI Comment 09/09/2018    Lab Results  Component Value Date   WBC 2.7 (L) 10/04/2019   NEUTROABS 1.3 (L) 10/04/2019   HGB 9.7 (L) 10/04/2019   HCT 31.0 (L) 10/04/2019   MCV 93.1 10/04/2019   PLT 151 10/04/2019    No results found for: LABCA2  No components found for: [id number]  No results for input(s): INR in the last 168 hours.  No results found for: LABCA2  No results found for: [id number]  No results found for: [id number]  No results found for: [id number]  No results found for: CA2729  No components found for: HGQUANT  No results found for: CEA1 / No results found for: CEA1   No results found for: AFPTUMOR  No results found for: CHROMOGRNA  No results found for: KPAFRELGTCHN, LAMBDASER, KAPLAMBRATIO (kappa/lambda light chains)  No results found for: HGBA, HGBA2QUANT, HGBFQUANT, HGBSQUAN (Hemoglobinopathy evaluation)   Lab Results  Component Value Date   LDH 165 09/08/2018    Lab Results  Component Value Date   IRON 92 07/12/2019   TIBC 330 07/12/2019   IRONPCTSAT 28 07/12/2019   (Iron and TIBC)  Lab Results  Component Value Date   FERRITIN 27 07/12/2019    Urinalysis    Component Value Date/Time   COLORURINE YELLOW 09/09/2018 [place] 09/09/2018 1050   LABSPEC 1.036 (H) 09/09/2018 1050   PHURINE 5.0 09/09/2018 [place] 09/09/2018 [place] 09/09/2018 [place] 09/09/2018 [place] 09/09/2018 [place] 09/09/2018 1050   NITRITE NEGATIVE 09/09/2018 1050  LEUKOCYTESUR NEGATIVE 09/09/2018 1050     STUDIES: No results found.   ELIGIBLE FOR AVAILABLE RESEARCH PROTOCOL: no  ASSESSMENT: 86 y.o.  [place] man admitted 09/08/2018 with severe iron deficiency anemia  (a) status post Feraheme 09/09/2018 and 09/14/2018   PLAN: [name]'s hemoglobin is below 10 again.  He had a very good response to iron replacement previously.  He is now taking oral iron 3 times a week because it is difficult for him to tolerate.  I suggested he go ahead and take a daily until we get the results of his iron studies.  If he is iron deficient it is easy enough to give him 2 additional doses of Feraheme which will correct that.  He is also mildly thrombocytopenic and leukopenic.  He had an irregular liver contour on 09/12/2018 on his CT abdomen and pelvis with enterography.  The spleen however was not enlarged.  I have requested a blood film today for review which is not ready yet.  At this point we are referring him back to GI.  He would like to try different group and I have put that referral in.  I will let him know results of his iron studies as soon as they become available.  He knows to call for any other issue that may develop before the next visit.  Total encounter time 30 minutes.Sarajane Jews C. Joshia Kitchings, MD 10/04/2019 11:52 AM Medical Oncology and Hematology [facility] [place], [place] [address] Tel. [phone]    Fax. [phone]  Addendum: With borderline iron studies ongoing to proceed engage in 2 doses of Feraheme.  Review of peripheral blood film was unremarkable  This document serves as a record of services personally performed by Lurline Del, MD. It was created on his behalf by Wilburn Mylar, a trained medical scribe. The creation of this record is based on the scribe's personal observations and the provider's statements to them.    I, Lurline Del MD, have reviewed the above documentation for accuracy and completeness, and I agree with the above.   *Total Encounter Time as defined by the Centers for Medicare and Medicaid Services includes, in  addition to the face-to-face time of a patient visit (documented in the note above) non-face-to-face time: obtaining and reviewing outside history, ordering and reviewing medications, tests or procedures, care coordination (communications with other health care professionals or caregivers) and documentation in the medical record.

## 2019-10-05 ENCOUNTER — Telehealth: Payer: Self-pay | Admitting: Oncology

## 2019-10-05 ENCOUNTER — Encounter: Payer: Self-pay | Admitting: Nurse Practitioner

## 2019-10-05 LAB — PATHOLOGIST SMEAR REVIEW

## 2019-10-05 NOTE — Telephone Encounter (Signed)
Scheduled appts per 5/20 LOS. Called patient to go over appt details. Pt stated that he was not around his calendar, but to put him on the schedule and that he would look into it when he gets to his calendar.

## 2019-10-05 NOTE — Addendum Note (Signed)
Addended by: Chauncey Cruel on: 10/05/2019 03:05 PM   Modules accepted: Orders

## 2019-10-05 NOTE — Telephone Encounter (Signed)
Scheduled appt per 5/21 sch msg - unable to reach pt . Left message with appt date and time

## 2019-10-08 ENCOUNTER — Other Ambulatory Visit: Payer: Self-pay | Admitting: Oncology

## 2019-10-08 NOTE — Progress Notes (Signed)
I called John Holder and let them know that his iron is borderline low and that we have set him up for some infusions this week and next.

## 2019-10-12 ENCOUNTER — Other Ambulatory Visit: Payer: Self-pay

## 2019-10-12 ENCOUNTER — Inpatient Hospital Stay: Payer: Medicare PPO

## 2019-10-12 VITALS — BP 97/59 | HR 56 | Temp 98.3°F | Resp 16

## 2019-10-12 DIAGNOSIS — D509 Iron deficiency anemia, unspecified: Secondary | ICD-10-CM | POA: Diagnosis not present

## 2019-10-12 DIAGNOSIS — D649 Anemia, unspecified: Secondary | ICD-10-CM

## 2019-10-12 DIAGNOSIS — D508 Other iron deficiency anemias: Secondary | ICD-10-CM

## 2019-10-12 MED ORDER — SODIUM CHLORIDE 0.9 % IV SOLN
INTRAVENOUS | Status: DC
  Filled 2019-10-12: qty 250

## 2019-10-12 MED ORDER — SODIUM CHLORIDE 0.9 % IV SOLN
510.0000 mg | Freq: Once | INTRAVENOUS | Status: AC
  Administered 2019-10-12: 510 mg via INTRAVENOUS
  Filled 2019-10-12: qty 510

## 2019-10-12 NOTE — Patient Instructions (Signed)

## 2019-10-19 ENCOUNTER — Other Ambulatory Visit: Payer: Self-pay | Admitting: Oncology

## 2019-10-19 ENCOUNTER — Other Ambulatory Visit: Payer: Self-pay

## 2019-10-19 ENCOUNTER — Inpatient Hospital Stay: Payer: Medicare PPO | Attending: Oncology

## 2019-10-19 VITALS — BP 104/73 | HR 62 | Temp 98.0°F | Resp 18

## 2019-10-19 DIAGNOSIS — I10 Essential (primary) hypertension: Secondary | ICD-10-CM | POA: Insufficient documentation

## 2019-10-19 DIAGNOSIS — D649 Anemia, unspecified: Secondary | ICD-10-CM

## 2019-10-19 DIAGNOSIS — D509 Iron deficiency anemia, unspecified: Secondary | ICD-10-CM | POA: Diagnosis not present

## 2019-10-19 DIAGNOSIS — D696 Thrombocytopenia, unspecified: Secondary | ICD-10-CM | POA: Diagnosis not present

## 2019-10-19 DIAGNOSIS — D508 Other iron deficiency anemias: Secondary | ICD-10-CM

## 2019-10-19 MED ORDER — ACETAMINOPHEN 325 MG PO TABS
ORAL_TABLET | ORAL | Status: AC
  Filled 2019-10-19: qty 2

## 2019-10-19 MED ORDER — DIPHENHYDRAMINE HCL 12.5 MG/5ML PO ELIX
ORAL_SOLUTION | ORAL | Status: AC
  Filled 2019-10-19: qty 5

## 2019-10-19 MED ORDER — SODIUM CHLORIDE 0.9 % IV SOLN
INTRAVENOUS | Status: DC
  Filled 2019-10-19: qty 250

## 2019-10-19 MED ORDER — SODIUM CHLORIDE 0.9 % IV SOLN
510.0000 mg | Freq: Once | INTRAVENOUS | Status: AC
  Administered 2019-10-19: 510 mg via INTRAVENOUS
  Filled 2019-10-19: qty 510

## 2019-10-19 MED ORDER — DIPHENHYDRAMINE HCL 12.5 MG/5ML PO ELIX: 12.5000 mg | ORAL_SOLUTION | Freq: Once | ORAL | Status: DC

## 2019-10-19 MED ORDER — ACETAMINOPHEN 325 MG PO TABS: 650.0000 mg | ORAL_TABLET | Freq: Once | ORAL | Status: DC

## 2019-10-19 NOTE — Patient Instructions (Signed)

## 2019-10-22 ENCOUNTER — Other Ambulatory Visit (INDEPENDENT_AMBULATORY_CARE_PROVIDER_SITE_OTHER): Payer: Medicare PPO

## 2019-10-22 ENCOUNTER — Encounter: Payer: Self-pay | Admitting: Nurse Practitioner

## 2019-10-22 ENCOUNTER — Telehealth: Payer: Self-pay | Admitting: General Surgery

## 2019-10-22 ENCOUNTER — Ambulatory Visit (INDEPENDENT_AMBULATORY_CARE_PROVIDER_SITE_OTHER): Payer: Medicare PPO | Admitting: Nurse Practitioner

## 2019-10-22 VITALS — BP 104/70 | HR 52 | Ht 71.0 in | Wt 167.0 lb

## 2019-10-22 DIAGNOSIS — Z7901 Long term (current) use of anticoagulants: Secondary | ICD-10-CM | POA: Diagnosis not present

## 2019-10-22 DIAGNOSIS — D509 Iron deficiency anemia, unspecified: Secondary | ICD-10-CM

## 2019-10-22 DIAGNOSIS — K7460 Unspecified cirrhosis of liver: Secondary | ICD-10-CM

## 2019-10-22 LAB — PROTIME-INR
INR: 1.4 ratio — ABNORMAL HIGH (ref 0.8–1.0)
Prothrombin Time: 15.5 s — ABNORMAL HIGH (ref 9.6–13.1)

## 2019-10-22 LAB — IGA: IgA: 55 mg/dL — ABNORMAL LOW (ref 68–378)

## 2019-10-22 NOTE — Progress Notes (Signed)
ASSESSMENT / PLAN:   86 year old male with atrial fibrillation, mitral regurgitation, left nephrectomy,  HTN, iron deficiency anemia, cholecystectomy.   # Recurrent iron deficiency anemia  --On Eliquis but no overt GI bleeding.  --Etiology of recurrent IDA unclear.Getting iron replacement by Dr. Jana Hakim.  --Unrevealing inpatient EGD / colonoscopy by [facility] in April 2020. Duodenum appeared normal so no biopsies obtained to look for celiac. .  IDA probably not secondary to celiac with normal looking small bowel , no symptoms but will obtain tTg, IgA to be sure. --Small bowel video capsule during same admission in April 2020 suggested mid small bowel mass with venous blebs in area. However,  no mass found on subsequent CT enterography.  --The CT enterography did raise concern for cirrhosis. No portal hypertensive changes on EGD at that time but wonder if they haven't since developed and contributing to anemia.  --For evaluation of recurrent IDA will arrange for EGD. The risks and benefits of EGD were discussed and the patient agrees to proceed --If EGD unrevealing then will need repeat video capsule study. .    # Possible cirrhosis by CT enterography April 2020.  --No evidence for portal HTN at the time ( radiographically nor endoscopically).  --In setting of recurrent IDA, possible cirrhosis and need for Xarelto will proceed with repeat EGD.  --Further evaluation for portal HTN at time of EGD.   # Afib on Eliquis --Hold Eliquis for 2 days before procedure - will instruct when and how to resume after procedure. Patient understands that there is a low but real risk of cardiovascular event such as heart attack, stroke, or embolism /  thrombosis while off blood thinner. The patient consents to proceed. Will communicate by phone or EMR with patient's prescribing provider to confirm that holding Eliquis is reasonable in this case.   # ? Amyloidosis --Severe biatrial enlargement with  moderate concentric LVH on echo 10/16/19. Not typical echo appearance amyloid, it cannot be excluded.  --Discussed with Dr. Carlean Purl, he will biopsy duodenum at time of EGD.   HPI:     Chief Complaint:  anemia   John Holder is an 86 yo male, former patient of Dr. Amedeo Plenty with [facility].[name] He is referred to Dr. Carlean Purl by Dr. Jana Hakim at [facility] for evaluation of iron deficiency anemia patient was admitted to the hospital late April 2020 with fatigue and dyspnea on exertion.  He was found to be profoundly anemic with a hemoglobin of 6, down 9 grams from baseline 3 months earlier. MCV of 77 and a ferritin of only 7 stools were Hemoccult negative.  Patient was evaluated in the hospital by [facility].  He was transfused and underwent inpatient endoscopic evaluation.  EGD and colonoscopy were unrevealing for source of anemia.  Video capsule study showed questionable mid small bowel mass.  Subsequent CT enterography did not show a small bowel mass nor any definite wall thickening.    Patient has been followed by the cancer center for iron deficiency anemia, last seen by Dr. Jana Hakim on 10/04/19.  He underwent iron replacement with good response.but had recurrent anemia. He reduced oral iron to QOD around January. He has received two iron infusion within the last 4-5 weeks. No overt GI bleeding.  No hematuria.  No epistaxis, bleeding gums.  Does not donate blood.   Data Reviewed:   10/04/2019 Hemoglobin 9.7, down to 11.9 in February 2021 TIBC 348, 5% saturation, ferritin  40,   Past Medical History:  Diagnosis Date  . Hypertension   . Iron deficiency anemia    amdx 08/2018 >> Tx with PRBCs and Feraheme started; no GI source per workup; followed by Heme (Dr. Jana Hakim) and GI (Dr. Cristina Gong)   . Left renal mass    s/p L nephrectomy  . Mitral regurgitation    Echo 02/2016:  Mild conc LVH, EF 55-60, mild to mod MR, mod LAE, mod to severe RAE, mod TR, PASP 41 // Echo 10/2018: EF 60-65, mod conc LVH, severe  BAE, trivial eff, mild MVP with mod MR, mod to severe TR, asc aorta 36 (mild dilation)   . Persistent Atrial Fibrillation      Past Surgical History:  Procedure Laterality Date  . CARDIOVERSION N/A 02/04/2016   Procedure: CARDIOVERSION;  Surgeon: Thayer Headings, MD;  Location: [facility];  Service: Cardiovascular;  Laterality: N/A;  . COLONOSCOPY WITH PROPOFOL N/A 09/10/2018   Procedure: COLONOSCOPY WITH PROPOFOL;  Surgeon: Ronald Lobo, MD;  Location: [facility];  Service: Endoscopy;  Laterality: N/A;  . ESOPHAGOGASTRODUODENOSCOPY (EGD) WITH PROPOFOL N/A 09/10/2018   Procedure: ESOPHAGOGASTRODUODENOSCOPY (EGD) WITH PROPOFOL;  Surgeon: Ronald Lobo, MD;  Location: [facility];  Service: Endoscopy;  Laterality: N/A;  . GIVENS CAPSULE STUDY N/A 09/10/2018   Procedure: GIVENS CAPSULE STUDY;  Surgeon: Ronald Lobo, MD;  Location: [facility];  Service: Endoscopy;  Laterality: N/A;   Family History  Problem Relation Age of Onset  . Breast cancer Mother   . Hypertension Father   . Diabetes Neg Hx   . Stroke Neg Hx   . CAD Neg Hx    Social History   Tobacco Use  . Smoking status: Never Smoker  . Smokeless tobacco: Never Used  Substance Use Topics  . Alcohol use: Yes    Alcohol/week: 0.0 standard drinks    Comment: occasional  . Drug use: No   Current Outpatient Medications  Medication Sig Dispense Refill  . ALPRAZolam (XANAX) 0.25 MG tablet Take 0.25 mg by mouth as needed for anxiety.   1  . Apoaequorin (PREVAGEN EXTRA STRENGTH PO) Takes once daily    . Barberry-Oreg Grape-Goldenseal (BERBERINE COMPLEX PO) Take 600 mg by mouth daily. Takes 2 capsule once daily    . Chromium Picolinate 400 MCG TABS Takes once tablet twice daily    . Coenzyme Q10 (CO Q10) 200 MG CAPS Takes one capsule once daily    . ELIQUIS 5 MG TABS tablet Take 5 mg by mouth 2 (two) times daily.     . ferrous gluconate (FERGON) 324 MG tablet Take 324 mg by mouth daily with breakfast.    . Garlic 7619  MG CAPS Takes one capsule twice daily    . glucosamine-chondroitin 500-400 MG tablet Take 1 tablet by mouth 2 (two) times daily.     . hydrochlorothiazide (HYDRODIURIL) 25 MG tablet Take 25 mg by mouth daily.    . Linoleic Acid Conjugated 1000 MG CAPS Takes one capsule twice daily    . losartan (COZAAR) 50 MG tablet TAKE 1 TABLET BY MOUTH EVERY DAY 90 tablet 3  . Magnesium Glycinate 665 MG CAPS Takes one capsule twice daily    . Melatonin 5 MG TABS Takes one tablet at bedtime    . Multiple Vitamin (MULTIVITAMIN) capsule Take 1 capsule by mouth daily. Takes two tablets once daily    . omeprazole (PRILOSEC) 20 MG capsule Take 20 mg by mouth daily.    [name] OVER THE COUNTER MEDICATION  Uses 3 different types of Herbal combinations for prostate, one for urinary care and one for liver care. It's 3 separate bottles    . potassium chloride (KLOR-CON) 10 MEQ tablet Take 10 mEq by mouth daily.    . Calcium-Phosphorus-Vitamin D (CITRACAL +D3 PO) Takes two tablets once daily    . metoprolol succinate (TOPROL-XL) 50 MG 24 hr tablet Take 1 tablet (50 mg total) by mouth 2 (two) times a day. Take with or immediately following a meal. 180 tablet 3   No current facility-administered medications for this visit.   No Known Allergies   Review of Systems: Positive for swelling of feet and legs and shortness of breath.  All other systems reviewed and negative except where noted in HPI.   Creatinine clearance cannot be calculated (Patient's most recent lab result is older than the maximum 21 days allowed.)   Physical Exam:    Wt Readings from Last 3 Encounters:  10/22/19 167 lb (75.8 kg)  10/04/19 169 lb 14.4 oz (77.1 kg)  06/05/19 171 lb 12.8 oz (77.9 kg)    BP 104/70   Pulse (!) 52   Ht 5\' 11"  (1.803 m)   Wt 167 lb (75.8 kg)   BMI 23.29 kg/m  Constitutional:  Pleasant male in no acute distress. Psychiatric: Normal mood and affect. Behavior is normal. EENT: Pupils normal.  Conjunctivae are normal. No  scleral icterus. Neck supple.  Cardiovascular: Normal rate, regular rhythm. No edema Pulmonary/chest: Effort normal and breath sounds normal. No wheezing, rales or rhonchi. Abdominal: Soft, nondistended, nontender. Bowel sounds active throughout. There are no masses palpable. No hepatomegaly. Neurological: Alert and oriented to person place and time. Skin: Skin is warm and dry. No rashes noted.  Tye Savoy, NP  10/22/2019, 2:26 PM  Cc:  Referring Provider Magrinat, Virgie Dad, MD

## 2019-10-22 NOTE — Telephone Encounter (Signed)
[facility] Pre-operative Risk Assessment     Request for surgical clearance:     Endoscopy Procedure  What type of surgery is being performed?     Endoscopy  When is this surgery scheduled?     10/31/2019  What type of clearance is required ?   Pharmacy  Are there any medications that need to be held prior to surgery and how long? Eliquis for 2 days  Practice name and name of physician performing surgery?      [facility]  What is your office phone and fax number?      Phone- [phone]  Fax414 [phone]  Anesthesia type (None, local, MAC, general) ?       MAC

## 2019-10-22 NOTE — Patient Instructions (Addendum)
If you are age 84 or older, your body mass index should be between 23-30. Your Body mass index is 23.29 kg/m. If this is out of the aforementioned range listed, please consider follow up with your Primary Care Provider.  If you are age 51 or younger, your body mass index should be between 19-25. Your Body mass index is 23.29 kg/m. If this is out of the aformentioned range listed, please consider follow up with your Primary Care Provider.   Your provider has requested that you go to the basement level for lab work before leaving today. Press "B" on the elevator. The lab is located at the first door on the left as you exit the elevator.  Due to recent changes in healthcare laws, you may see the results of your imaging and laboratory studies on MyChart before your provider has had a chance to review them.  We understand that in some cases there may be results that are confusing or concerning to you. Not all laboratory results come back in the same time frame and the provider may be waiting for multiple results in order to interpret others.  Please give [place] 48 hours in order for your provider to thoroughly review all the results before contacting the office for clarification of your results.   You have been scheduled for an endoscopy. Please follow written instructions given to you at your visit today. If you use inhalers (even only as needed), please bring them with you on the day of your procedure.  You will be contacted by our office prior to your procedure for directions on holding your Eliquis.  If you do not hear from our office 1 week prior to your scheduled procedure, please call [phone] to discuss.    I appreciate the opportunity to care for you. Tye Savoy, NP-C

## 2019-10-23 LAB — TISSUE TRANSGLUTAMINASE, IGA: (tTG) Ab, IgA: 1 U/mL

## 2019-10-23 NOTE — Telephone Encounter (Signed)
Notified the patient to stop his Eliquis 2 days prior to his procedure. The patient verbalized understanding and read back the instructions given.

## 2019-10-23 NOTE — Telephone Encounter (Signed)
   Primary Cardiologist: Mertie Moores, MD  Chart reviewed as part of pre-operative protocol coverage. Patient is scheduled for an endoscopy on 10/31/2019 and we were asked for our recommendations on holding Eliquis. Per pharmacy and office protocol, "patient can hold Eliquis for 2 days prior to procedure."  I will route this recommendation to the requesting party via Epic fax function and remove from pre-op pool.  Please call with questions.  Darreld Mclean, PA-C 10/23/2019, 9:38 AM

## 2019-10-23 NOTE — Telephone Encounter (Signed)
Patient with diagnosis of atrial fibrillation on Eliquis for anticoagulation.    Procedure: endoscopy Date of procedure: 10/31/19  CHADS2-VASc score of  3 (HTN, AGE x 2)  CrCl 55.2 Platelet count 151  Per office protocol, patient can hold Eliquis for 2 days prior to procedure.   [name]

## 2019-10-23 NOTE — Telephone Encounter (Signed)
Pharmacy, can you please comment on how long patient can hold Eliquis for upcoming procedure?  Thank you! 

## 2019-10-24 ENCOUNTER — Encounter: Payer: Self-pay | Admitting: Internal Medicine

## 2019-10-31 ENCOUNTER — Encounter: Payer: Self-pay | Admitting: Internal Medicine

## 2019-10-31 ENCOUNTER — Other Ambulatory Visit: Payer: Self-pay

## 2019-10-31 ENCOUNTER — Ambulatory Visit (AMBULATORY_SURGERY_CENTER): Payer: Medicare PPO | Admitting: Internal Medicine

## 2019-10-31 VITALS — BP 107/77 | HR 101 | Temp 96.4°F | Resp 10 | Ht 71.0 in | Wt 167.0 lb

## 2019-10-31 DIAGNOSIS — K3189 Other diseases of stomach and duodenum: Secondary | ICD-10-CM

## 2019-10-31 DIAGNOSIS — D508 Other iron deficiency anemias: Secondary | ICD-10-CM

## 2019-10-31 MED ORDER — SODIUM CHLORIDE 0.9 % IV SOLN
500.0000 mL | Freq: Once | INTRAVENOUS | Status: DC
Start: 2019-10-31 — End: 2019-10-31

## 2019-10-31 NOTE — Progress Notes (Signed)
Report to PACU, RN, vss, BBS= Clear.  

## 2019-10-31 NOTE — Patient Instructions (Addendum)
Part of the stomach looked mildly inflamed or irritated. Possibly losing some blood from here. I took biopsies to understand that better.  I also took biopsies of the upper intestine, as discissed and planned.  Once results are in will call and explain and plot future course.  I appreciate the opportunity to care for you. Gatha Mayer, MD, [facility]  Resume previous diet continue present medications  await pathology results Start eloquis tomorrow YOU HAD AN ENDOSCOPIC PROCEDURE TODAY AT [facility]:   Refer to the procedure report that was given to you for any specific questions about what was found during the examination.  If the procedure report does not answer your questions, please call your gastroenterologist to clarify.  If you requested that your care partner not be given the details of your procedure findings, then the procedure report has been included in a sealed envelope for you to review at your convenience later.  YOU SHOULD EXPECT: Some feelings of bloating in the abdomen. Passage of more gas than usual.  Walking can help get rid of the air that was put into your GI tract during the procedure and reduce the bloating. If you had a lower endoscopy (such as a colonoscopy or flexible sigmoidoscopy) you may notice spotting of blood in your stool or on the toilet paper. If you underwent a bowel prep for your procedure, you may not have a normal bowel movement for a few days.  Please Note:  You might notice some irritation and congestion in your nose or some drainage.  This is from the oxygen used during your procedure.  There is no need for concern and it should clear up in a day or so.  SYMPTOMS TO REPORT IMMEDIATELY:    Following upper endoscopy (EGD)  Vomiting of blood or coffee ground material  New chest pain or pain under the shoulder blades  Painful or persistently difficult swallowing  New shortness of breath  Fever of 100F or higher  Black, tarry-looking  stools  For urgent or emergent issues, a gastroenterologist can be reached at any hour by calling [phone]. Do not use MyChart messaging for urgent concerns.    DIET:  We do recommend a small meal at first, but then you may proceed to your regular diet.  Drink plenty of fluids but you should avoid alcoholic beverages for 24 hours.  ACTIVITY:  You should plan to take it easy for the rest of today and you should NOT DRIVE or use heavy machinery until tomorrow (because of the sedation medicines used during the test).    FOLLOW UP: Our staff will call the number listed on your records 48-72 hours following your procedure to check on you and address any questions or concerns that you may have regarding the information given to you following your procedure. If we do not reach you, we will leave a message.  We will attempt to reach you two times.  During this call, we will ask if you have developed any symptoms of COVID 19. If you develop any symptoms (ie: fever, flu-like symptoms, shortness of breath, cough etc.) before then, please call [phone].  If you test positive for Covid 19 in the 2 weeks post procedure, please call and report this information to [place].    If any biopsies were taken you will be contacted by phone or by letter within the next 1-3 weeks.  Please call us at [phone] if you have not heard about the biopsies  in 3 weeks.    SIGNATURES/CONFIDENTIALITY: You and/or your care partner have signed paperwork which will be entered into your electronic medical record.  These signatures attest to the fact that that the information above on your After Visit Summary has been reviewed and is understood.  Full responsibility of the confidentiality of this discharge information lies with you and/or your care-partner.

## 2019-10-31 NOTE — Progress Notes (Signed)
Called to room to assist during endoscopic procedure.  Patient ID and intended procedure confirmed with present staff. Received instructions for my participation in the procedure from the performing physician.  

## 2019-10-31 NOTE — Op Note (Signed)
[place] Patient Name: John Holder Procedure Date: 10/31/2019 1:50 PM MRN: [id number] Endoscopist: Gatha Mayer , MD Age: 86 Referring MD:  Date of Birth: Jul 07, 1931 Gender: Male Account #: [id number] Procedure:                Upper GI endoscopy Indications:              Iron deficiency anemia Medicines:                Propofol per Anesthesia, Monitored Anesthesia Care Procedure:                Pre-Anesthesia Assessment:                           - Prior to the procedure, a History and Physical                            was performed, and patient medications and                            allergies were reviewed. The patient's tolerance of                            previous anesthesia was also reviewed. The risks                            and benefits of the procedure and the sedation                            options and risks were discussed with the patient.                            All questions were answered, and informed consent                            was obtained. Prior Anticoagulants: The patient                            last took Eliquis (apixaban) 2 days prior to the                            procedure. ASA Grade Assessment: III - A patient                            with severe systemic disease. After reviewing the                            risks and benefits, the patient was deemed in                            satisfactory condition to undergo the procedure.                           After obtaining informed consent, the endoscope was  passed under direct vision. Throughout the                            procedure, the patient's blood pressure, pulse, and                            oxygen saturations were monitored continuously. The                            Endoscope was introduced through the mouth, and                            advanced to the second part of duodenum. The upper                            GI  endoscopy was accomplished without difficulty.                            The patient tolerated the procedure well. Scope In: Scope Out: Findings:                 The examined esophagus was normal.                           Patchy mild mucosal changes characterized by                            erythema, granularity and petechiae were found in                            the gastric body, on the greater curvature of the                            stomach and in the gastric antrum. Biopsies were                            taken with a cold forceps for histology.                            Verification of patient identification for the                            specimen was done. Estimated blood loss was minimal.                           The examined duodenum was normal. Biopsies were                            taken with a cold forceps for histology.                            Verification of patient identification for the  specimen was done. Estimated blood loss was minimal.                           The cardia and gastric fundus were normal on                            retroflexion. Complications:            No immediate complications. Estimated Blood Loss:     Estimated blood loss was minimal. Impression:               - Normal esophagus.                           - Erythematous, granular and petechial mucosa in                            the gastric body, greater curvature and antrum.                            Biopsied. ? patchy gastritis vs portal gastropathy                           - Normal examined duodenum. Biopsied. Rule out                            amyloidosis Recommendation:           - Patient has a contact number available for                            emergencies. The signs and symptoms of potential                            delayed complications were discussed with the                            patient. Return to normal activities  tomorrow.                            Written discharge instructions were provided to the                            patient.                           - Resume previous diet.                           - Continue present medications.                           - Resume Eliquis (apixaban) at prior dose tomorrow.                           - Await pathology results.                           -  I will call with results and plans. Note CT                            enterography 2020 suggested possible cirrhosis so                            portal hypertension in differential Gatha Mayer, MD 10/31/2019 2:20:39 PM This report has been signed electronically.

## 2019-11-02 ENCOUNTER — Telehealth: Payer: Self-pay

## 2019-11-02 ENCOUNTER — Telehealth: Payer: Self-pay | Admitting: *Deleted

## 2019-11-02 NOTE — Telephone Encounter (Signed)
2nd follow up call made.  NALM 

## 2019-11-02 NOTE — Telephone Encounter (Signed)
First attempt, left VM.  

## 2019-11-07 NOTE — Progress Notes (Signed)
Called patient - no recall or letter needed  Biopsies not revealing  He has 2 main GI issues right now it seems  1) iron-deficiency anemia w/o clear source and ? Of mass on capsule endoscopy last year but neg CT-enterography  2) ? Of cirrhosis  Also being followed by Irine Seal for renal mass(es)  I explained that it would be helpful to repeat a capsule endoscopy and an MR abdomen - would probably do MR abdomen first to look at liver, pancreatic cyst (think benign) and could also f/u the kidney issues.  Am ccing Dr's Magrinat and Jeffie Pollock as FYI and to get Dr. Ralene Muskrat thoughts re: to coordinate MRI so I get him info he needs as well. Per patient he said he has had a CT of kidneys since 2018 though I do not find it in PACS

## 2019-11-09 ENCOUNTER — Other Ambulatory Visit: Payer: Self-pay

## 2019-11-12 ENCOUNTER — Other Ambulatory Visit: Payer: Self-pay

## 2019-11-12 DIAGNOSIS — K862 Cyst of pancreas: Secondary | ICD-10-CM

## 2019-11-12 DIAGNOSIS — N2889 Other specified disorders of kidney and ureter: Secondary | ICD-10-CM

## 2019-11-12 DIAGNOSIS — K7460 Unspecified cirrhosis of liver: Secondary | ICD-10-CM

## 2019-11-14 ENCOUNTER — Other Ambulatory Visit: Payer: Self-pay | Admitting: Cardiovascular Disease

## 2019-11-14 ENCOUNTER — Other Ambulatory Visit: Payer: Self-pay

## 2019-11-14 MED ORDER — METOPROLOL SUCCINATE ER 50 MG PO TB24: 50.0000 mg | ORAL_TABLET | Freq: Two times a day (BID) | ORAL | 1 refills | Status: DC

## 2019-11-27 ENCOUNTER — Ambulatory Visit (HOSPITAL_COMMUNITY): Payer: Medicare PPO

## 2019-11-27 ENCOUNTER — Other Ambulatory Visit: Payer: Self-pay | Admitting: Internal Medicine

## 2019-11-27 ENCOUNTER — Ambulatory Visit (HOSPITAL_COMMUNITY): Admission: RE | Admit: 2019-11-27 | Payer: Medicare PPO | Source: Ambulatory Visit

## 2019-11-27 ENCOUNTER — Telehealth: Payer: Self-pay

## 2019-11-27 DIAGNOSIS — D509 Iron deficiency anemia, unspecified: Secondary | ICD-10-CM

## 2019-11-27 NOTE — Telephone Encounter (Signed)
Received call from radiology, we have had to reschedule MRI due to Feraheme infusion on 10/19/19. New date of MRI is 01/22/20 at 12 pm and 1 pm with 11:30 am arrival time at [facility] Radiology Department, NPO 4 hours prior. Will notify patient via My Chart.

## 2019-11-27 NOTE — Progress Notes (Signed)
Recheck CBC and ferritin after feraheme

## 2019-11-28 ENCOUNTER — Other Ambulatory Visit (INDEPENDENT_AMBULATORY_CARE_PROVIDER_SITE_OTHER): Payer: Medicare PPO

## 2019-11-28 DIAGNOSIS — D509 Iron deficiency anemia, unspecified: Secondary | ICD-10-CM

## 2019-11-28 LAB — CBC
HCT: 42.3 % (ref 39.0–52.0)
Hemoglobin: 14.2 g/dL (ref 13.0–17.0)
MCHC: 33.5 g/dL (ref 30.0–36.0)
MCV: 94.7 fl (ref 78.0–100.0)
Platelets: 124 10*3/uL — ABNORMAL LOW (ref 150.0–400.0)
RBC: 4.46 Mil/uL (ref 4.22–5.81)
RDW: 18.4 % — ABNORMAL HIGH (ref 11.5–15.5)
WBC: 3 10*3/uL — ABNORMAL LOW (ref 4.0–10.5)

## 2019-11-28 LAB — FERRITIN: Ferritin: 36 ng/mL (ref 22.0–322.0)

## 2019-11-29 ENCOUNTER — Other Ambulatory Visit: Payer: Self-pay

## 2019-11-29 DIAGNOSIS — D500 Iron deficiency anemia secondary to blood loss (chronic): Secondary | ICD-10-CM

## 2019-11-29 NOTE — Progress Notes (Signed)
John Holder,  Your hemoglobin is now normal but ferritin still low normal - I suspect you used the iron infusion to build back red blood cells and that is why. I will cc Dr. Jana Hakim to see this.  I am now recommending that we repeat a capsule endoscopy test - I was considering that previously but thought I would get the MRI first. Since we have to wait for that and there are still ?'s as to what is going on let's do the capsule endoscopy.  My RN staff will contact you about this and arrange for to have another capsule endoscopy at [facility] - want to use the Given[facility] at hospital)  Dx is chronic blood loss anemia  Let me know if any ?'s  Best regards,  Gatha Mayer, MD, [facility]

## 2019-12-03 ENCOUNTER — Other Ambulatory Visit: Payer: Self-pay

## 2019-12-03 ENCOUNTER — Encounter: Payer: Self-pay | Admitting: Physician Assistant

## 2019-12-03 ENCOUNTER — Ambulatory Visit: Payer: Medicare PPO | Admitting: Physician Assistant

## 2019-12-03 VITALS — BP 132/82 | HR 72 | Ht 71.0 in | Wt 168.2 lb

## 2019-12-03 DIAGNOSIS — D508 Other iron deficiency anemias: Secondary | ICD-10-CM | POA: Diagnosis not present

## 2019-12-03 DIAGNOSIS — I4821 Permanent atrial fibrillation: Secondary | ICD-10-CM | POA: Diagnosis not present

## 2019-12-03 DIAGNOSIS — I340 Nonrheumatic mitral (valve) insufficiency: Secondary | ICD-10-CM

## 2019-12-03 DIAGNOSIS — I10 Essential (primary) hypertension: Secondary | ICD-10-CM

## 2019-12-03 DIAGNOSIS — I361 Nonrheumatic tricuspid (valve) insufficiency: Secondary | ICD-10-CM

## 2019-12-03 NOTE — Patient Instructions (Signed)
Medication Instructions:  Your physician recommends that you continue on your current medications as directed. Please refer to the Current Medication list given to you today.  *If you need a refill on your cardiac medications before your next appointment, please call your pharmacy*  Lab Work: None ordered today  Testing/Procedures: None ordered today  Follow-Up: At [facility], you and your health needs are our priority.  As part of our continuing mission to provide you with exceptional heart care, we have created designated Provider Care Teams.  These Care Teams include your primary Cardiologist (physician) and Advanced Practice Providers (APPs -  Physician Assistants and Nurse Practitioners) who all work together to provide you with the care you need, when you need it.  We recommend signing up for the patient portal called "MyChart".  Sign up information is provided on this After Visit Summary.  MyChart is used to connect with patients for Virtual Visits (Telemedicine).  Patients are able to view lab/test results, encounter notes, upcoming appointments, etc.  Non-urgent messages can be sent to your provider as well.   To learn more about what you can do with MyChart, go to [email or web address].    Your next appointment:   6 month(s)  The format for your next appointment:   In Person  Provider:   Mertie Moores, MD

## 2019-12-03 NOTE — Progress Notes (Signed)
Cardiology Office Note:    Date:  12/03/2019   ID:  John Holder, DOB 16-Jun-1931, MRN [id number]  PCP:  Burnard Bunting, MD  Cardiologist:  Mertie Moores, MD  Electrophysiologist:  None  Hematologist:  Dr. Jana Hakim Gastroenterologist:  Dr. Amedeo Plenty  Referring MD: Burnard Bunting, MD   Chief Complaint:  Follow-up (AFib)    Patient Profile:    John Holder is a 86 y.o. male with:   Atrial fibrillation   CHA2DS2-VASc=3 (age x 2, HTN) >> Apixaban    Hypertension   S/p L nephrectomy 2/2 renal mass  R renal mass  Iron deficiency anemia  ?cirrhosis (CT 08/2018 w/ changes suspicious for cirrhosis)  Aortic atherosclerosis   Prior CV studies: Echocardiogram 10/16/2018 EF 60-65, moderate concentric LVH, no RWMA, normal RVSF, severe BAE, trivial pericardial effusion, mild mitral valve prolapse with moderate MR, moderate-severe TR, mild dilation of ascending aorta (36 mm)  Echo 02/18/16 Mild conc LVH, EF 55-60, mild to mod MR, mod LAE, mod to severe RAE, mod TR, PASP 41   History of Present Illness:    John Holder was last seen by Dr. Acie Fredrickson in January 2021.  Since that time, he has had repeat EGD for recurrent iron deficiency anemia.  Review of the chart demonstrates that there is no clear source of iron deficiency anemia.  There is also a question of a mass on capsule endoscopy last year but CT enterography was negative.  There is also a question of cirrhosis on prior testing.  There has been noted a R renal mass.  Duodenal biopsies at his most recent EGD were negative (stain for amyloidosis was negative).  His gastroenterologist (Dr. Carlean Purl) has referred him back to his urologist (Dr. Jeffie Pollock) for ongoing evaluation of his renal mass.  He has also been followed by Dr. Jana Hakim for hematology.    He returns for follow-up.  He is here alone.  Overall, he has been doing well.  He walks daily and plays tennis twice a week.  He has not had chest discomfort, significant shortness of breath,  orthopnea, syncope or significant leg swelling.  His recent hemoglobin was 14.  He has not seen any gross bleeding.  Past Medical History:  Diagnosis Date  . Blood transfusion without reported diagnosis   . Cataract    removed in replace bilateral  . GERD (gastroesophageal reflux disease)   . Hypertension   . Iron deficiency anemia    amdx 08/2018 >> Tx with PRBCs and Feraheme started; no GI source per workup; followed by Heme (Dr. Jana Hakim) and GI (Dr. Cristina Gong)   . Left renal mass    s/p L nephrectomy  . Mitral regurgitation    Echo 02/2016:  Mild conc LVH, EF 55-60, mild to mod MR, mod LAE, mod to severe RAE, mod TR, PASP 41 // Echo 10/2018: EF 60-65, mod conc LVH, severe BAE, trivial eff, mild MVP with mod MR, mod to severe TR, asc aorta 36 (mild dilation)   . Persistent Atrial Fibrillation     Current Medications: Current Meds  Medication Sig  . ALPRAZolam (XANAX) 0.25 MG tablet Take 0.25 mg by mouth as needed for anxiety.   [name] Apoaequorin (PREVAGEN EXTRA STRENGTH PO) Takes once daily  . Barberry-Oreg Grape-Goldenseal (BERBERINE COMPLEX PO) Take 600 mg by mouth daily. Takes 2 capsule once daily  . Chromium Picolinate 400 MCG TABS Takes once tablet twice daily  . ciclopirox (PENLAC) 8 % solution   . Coenzyme Q10 (CO Q10) 200 MG  CAPS Takes one capsule once daily  . ELIQUIS 5 MG TABS tablet Take 5 mg by mouth 2 (two) times daily.   . ferrous gluconate (FERGON) 324 MG tablet Take 324 mg by mouth daily with breakfast.  . Garlic 1062 MG CAPS Takes one capsule twice daily  . glucosamine-chondroitin 500-400 MG tablet Take 1 tablet by mouth 2 (two) times daily.   . hydrochlorothiazide (HYDRODIURIL) 25 MG tablet Take 25 mg by mouth daily.  . Linoleic Acid Conjugated 1000 MG CAPS Takes one capsule twice daily  . losartan (COZAAR) 50 MG tablet TAKE 1 TABLET BY MOUTH EVERY DAY  . Magnesium Glycinate 665 MG CAPS Takes one capsule twice daily  . Melatonin 5 MG TABS Takes one tablet at bedtime   . metoprolol succinate (TOPROL-XL) 50 MG 24 hr tablet Take 1 tablet (50 mg total) by mouth 2 (two) times daily. Take with or immediately following a meal.  . Multiple Vitamin (MULTIVITAMIN) capsule Take 1 capsule by mouth daily. Takes two tablets once daily  . omeprazole (PRILOSEC) 20 MG capsule Take 20 mg by mouth daily.  [name] OVER THE COUNTER MEDICATION Uses 3 different types of Herbal combinations for prostate, one for urinary care and one for liver care. It's 3 separate bottles  . potassium chloride (KLOR-CON) 10 MEQ tablet Take 10 mEq by mouth daily.     Allergies:   Patient has no known allergies.   Social History   Tobacco Use  . Smoking status: Never Smoker  . Smokeless tobacco: Never Used  Vaping Use  . Vaping Use: Never used  Substance Use Topics  . Alcohol use: Yes    Alcohol/week: 0.0 standard drinks    Comment: occasional  . Drug use: No     Family Hx: The patient's family history includes Breast cancer in his mother; Hypertension in his father. There is no history of Diabetes, Stroke, CAD, Colon cancer, Colon polyps, Esophageal cancer, Rectal cancer, or Stomach cancer.  ROS   EKGs/Labs/Other Test Reviewed:    EKG:  EKG is   ordered today.  The ekg ordered today demonstrates atrial fibrillation, HR 72, normal axis, nonspecific ST-T wave changes, incomplete right bundle branch block, QTC 470, no change since prior tracing  Recent Labs: 12/19/2018: BUN 19; Creatinine, Ser 1.05; Potassium 4.1; Sodium 139 11/28/2019: Hemoglobin 14.2; Platelets 124.0   Recent Lipid Panel Lab Results  Component Value Date/Time   TRIG 31 09/08/2018 03:56 PM    Physical Exam:    VS:  BP 132/82   Pulse 72   Ht 5\' 11"  (1.803 m)   Wt 168 lb 3.2 oz (76.3 kg)   SpO2 97%   BMI 23.46 kg/m     Wt Readings from Last 3 Encounters:  12/03/19 168 lb 3.2 oz (76.3 kg)  10/31/19 167 lb (75.8 kg)  10/22/19 167 lb (75.8 kg)     Constitutional:      Appearance: Healthy appearance. Not in  distress.  Neck:     Vascular: JVD normal.  Pulmonary:     Effort: Pulmonary effort is normal.     Breath sounds: No wheezing. No rales.  Cardiovascular:     Normal rate. Regular rhythm. Normal S1. Normal S2.     Murmurs: There is a grade 3/6 holosystolic murmur at the LLSB.  Edema:    Peripheral edema absent.  Abdominal:     Palpations: Abdomen is soft.  Skin:    General: Skin is warm and dry.  Neurological:  General: No focal deficit present.     Mental Status: Alert and oriented to person, place and time.     Cranial Nerves: Cranial nerves are intact.      ASSESSMENT & PLAN:    1. Permanent atrial fibrillation (HCC) Rate is well controlled.  He is tolerating anticoagulation.  Recent hemoglobin and creatinine normal.  As long as he is not actively bleeding, it should be safe to continue him on Apixaban.  Follow-up in 6 months.  2. Essential hypertension He brings in a list of his blood pressures.  These are all optimal.  Continue current medications.  3. Other iron deficiency anemia He has had continued evaluation by gastroenterology as well as hematology.  There has been no clear source of bleeding identified.  He has been evaluated for possible cirrhosis as well.  As noted, recent hemoglobin normal.  4. Nonrheumatic mitral valve regurgitation 5. Nonrheumatic tricuspid valve regurgitation No significant symptoms.  Continue medical management.  He only takes diuretic therapy about 3 to 4 days a week.  Volume status appears stable.   Dispo:  Return in about 6 months (around 06/04/2020) for Routine Follow Up, w/ Dr. Acie Fredrickson, in person.   Medication Adjustments/Labs and Tests Ordered: Current medicines are reviewed at length with the patient today.  Concerns regarding medicines are outlined above.  Tests Ordered: Orders Placed This Encounter  Procedures  . EKG 12-Lead   Medication Changes: No orders of the defined types were placed in this  encounter.   Signed, Richardson Dopp, PA-C  12/03/2019 2:57 PM    [facility] [place], [place], [place]  [address] Phone: [phone]; Fax: [phone]

## 2019-12-20 ENCOUNTER — Telehealth: Payer: Self-pay | Admitting: Internal Medicine

## 2019-12-20 NOTE — Telephone Encounter (Signed)
Patient has a conflict with the proceudre scheduled for 12/31/2019 at the hospital please call to reschedule.

## 2019-12-20 NOTE — Telephone Encounter (Signed)
Sheri,  FYI: I submitted the prior auth for patient's capsule study that is scheduled for 12/24/2019 on 12/11/2019. I spoke with the insurance company this morning and it is still pending approval, they follow medicare's guidelines which is 14 days for the case to be approved.  Patient may need to be rescheduled until this is approved.

## 2019-12-20 NOTE — Telephone Encounter (Signed)
Spoke with patient, patient is aware that we have had to reschedule his capsule study. Rescheduled capsule study to 12/31/19 at 8 am at [facility]. Pt advised to adjust instructions according to new appt date and time.

## 2019-12-20 NOTE — Telephone Encounter (Signed)
Spoke with patient, rescheduled capsule study at [facility] for 01/01/20 at 8 am due to a conflict of appts

## 2019-12-26 NOTE — Telephone Encounter (Signed)
I think we may have put it in for the iron deficiency anemia but there was a ? Of a mass seen on the capsule last year so that is another reason.  I need to see about resubmitting with different dx or doing a peer if necessary.  I need to see the list of indications also

## 2019-12-26 NOTE — Telephone Encounter (Signed)
Pt called inquiring if capsule study was approved by his insurance.

## 2019-12-26 NOTE — Telephone Encounter (Signed)
Will await response from Amy H - pre cert

## 2019-12-26 NOTE — Telephone Encounter (Signed)
John Holder, John Holder, [name], RN Case has been denied. He has to have both EGD and Colonoscopy within 12 months. He only had EGD. He has to have colonoscopy before he can be approved for the capsule.  Im asking them to fax me the denial. I can get it to you when I get it.  His procedure will need to be cancelled for now.  Per Mardene Celeste  Ref# [id number]  Thanks John   Dr. Carlean Purl pt denied for capsule study at hospital, will cancel appt. Please advise on how you would like to proceed, thank you

## 2019-12-27 NOTE — Telephone Encounter (Signed)
Per Amy - "Not usually with Medicare guidelines. Almost all of the capsules that we have processed were denied if they didn't have a recent EGD and Colon. This is actually a guideline for most insurances. No matter the diagnosis.  But, it may get approved with an appeal/peer to peer. There are no guarantees."   I received the fax from [place] - for a Fast Appeal you may call [phone], [place]: [place], [place]: [phone]

## 2019-12-31 ENCOUNTER — Encounter: Payer: Self-pay | Admitting: Internal Medicine

## 2019-12-31 DIAGNOSIS — K639 Disease of intestine, unspecified: Secondary | ICD-10-CM | POA: Insufficient documentation

## 2020-01-01 ENCOUNTER — Encounter (HOSPITAL_COMMUNITY): Admission: RE | Payer: Self-pay | Source: Home / Self Care

## 2020-01-01 ENCOUNTER — Ambulatory Visit (HOSPITAL_COMMUNITY): Admission: RE | Admit: 2020-01-01 | Payer: Medicare PPO | Source: Home / Self Care | Admitting: Internal Medicine

## 2020-01-01 SURGERY — IMAGING PROCEDURE, GI TRACT, INTRALUMINAL, VIA CAPSULE

## 2020-01-09 ENCOUNTER — Inpatient Hospital Stay: Payer: Medicare PPO | Attending: Oncology | Admitting: Oncology

## 2020-01-09 ENCOUNTER — Other Ambulatory Visit: Payer: Self-pay

## 2020-01-09 ENCOUNTER — Inpatient Hospital Stay: Payer: Medicare PPO

## 2020-01-09 VITALS — BP 131/74 | HR 68 | Temp 97.8°F | Resp 18 | Ht 71.0 in | Wt 169.0 lb

## 2020-01-09 DIAGNOSIS — I10 Essential (primary) hypertension: Secondary | ICD-10-CM | POA: Diagnosis not present

## 2020-01-09 DIAGNOSIS — D509 Iron deficiency anemia, unspecified: Secondary | ICD-10-CM | POA: Insufficient documentation

## 2020-01-09 DIAGNOSIS — D649 Anemia, unspecified: Secondary | ICD-10-CM

## 2020-01-09 DIAGNOSIS — Z7901 Long term (current) use of anticoagulants: Secondary | ICD-10-CM | POA: Diagnosis not present

## 2020-01-09 DIAGNOSIS — D696 Thrombocytopenia, unspecified: Secondary | ICD-10-CM

## 2020-01-09 DIAGNOSIS — D500 Iron deficiency anemia secondary to blood loss (chronic): Secondary | ICD-10-CM

## 2020-01-09 DIAGNOSIS — D61818 Other pancytopenia: Secondary | ICD-10-CM

## 2020-01-09 DIAGNOSIS — Z79899 Other long term (current) drug therapy: Secondary | ICD-10-CM | POA: Insufficient documentation

## 2020-01-09 DIAGNOSIS — I482 Chronic atrial fibrillation, unspecified: Secondary | ICD-10-CM

## 2020-01-09 DIAGNOSIS — D508 Other iron deficiency anemias: Secondary | ICD-10-CM

## 2020-01-09 LAB — IRON AND TIBC
Iron: 136 ug/dL (ref 42–163)
Saturation Ratios: 45 % (ref 20–55)
TIBC: 304 ug/dL (ref 202–409)
UIBC: 168 ug/dL (ref 117–376)

## 2020-01-09 LAB — CBC WITH DIFFERENTIAL/PLATELET
Abs Immature Granulocytes: 0.01 10*3/uL (ref 0.00–0.07)
Basophils Absolute: 0 10*3/uL (ref 0.0–0.1)
Basophils Relative: 0 %
Eosinophils Absolute: 0.1 10*3/uL (ref 0.0–0.5)
Eosinophils Relative: 2 %
HCT: 42.5 % (ref 39.0–52.0)
Hemoglobin: 14.1 g/dL (ref 13.0–17.0)
Immature Granulocytes: 0 %
Lymphocytes Relative: 21 %
Lymphs Abs: 0.7 10*3/uL (ref 0.7–4.0)
MCH: 31.3 pg (ref 26.0–34.0)
MCHC: 33.2 g/dL (ref 30.0–36.0)
MCV: 94.2 fL (ref 80.0–100.0)
Monocytes Absolute: 0.5 10*3/uL (ref 0.1–1.0)
Monocytes Relative: 16 %
Neutro Abs: 2.1 10*3/uL (ref 1.7–7.7)
Neutrophils Relative %: 61 %
Platelets: 124 10*3/uL — ABNORMAL LOW (ref 150–400)
RBC: 4.51 MIL/uL (ref 4.22–5.81)
RDW: 14.6 % (ref 11.5–15.5)
WBC: 3.4 10*3/uL — ABNORMAL LOW (ref 4.0–10.5)
nRBC: 0 % (ref 0.0–0.2)

## 2020-01-09 LAB — RETICULOCYTES
Immature Retic Fract: 6.5 % (ref 2.3–15.9)
RBC.: 4.41 MIL/uL (ref 4.22–5.81)
Retic Count, Absolute: 65.3 10*3/uL (ref 19.0–186.0)
Retic Ct Pct: 1.5 % (ref 0.4–3.1)

## 2020-01-09 LAB — FERRITIN: Ferritin: 68 ng/mL (ref 24–336)

## 2020-01-09 NOTE — Progress Notes (Signed)
[place]  [phone] [phone]     ID: John Holder DOB: 14-Dec-1931  MR#: [id number]  OEU#:[id number]  Patient Care Team: Burnard Bunting, MD as PCP - General (Internal Medicine) Nahser, Wonda Cheng, MD as PCP - Cardiology (Cardiology) Maysa Lynn, Virgie Dad, MD as Consulting Physician (Hematology and Oncology) Irine Seal, MD as Consulting Physician (Urology) Gatha Mayer, MD as Consulting Physician (Gastroenterology) Chauncey Cruel, MD OTHER MD:  CHIEF COMPLAINT: severe iron deficinecy anemia  CURRENT TREATMENT: Ferrous gluconate   INTERVAL HISTORY: John Holder returns today for follow up of his iron deficiency anemia and pancytopenia.    Since his last visit, he underwent upper endoscopy on 10/31/2019 under Dr. Carlean Purl. Pathology from the procedure [phone]) was benign. Per note from Dr. Carlean Purl, the plan is to perform abdominal MRI and then undergo repeat capsule endoscopy.  He is scheduled for pelvis and abdomen MRIs on 01/22/2020. This was delayed from 11/27/2019 due to receiving iron infusion on 10/19/2019.  We are following his hemoglobin, platelets and white cell counts: Results for BUSH, MURDOCH" (MRN [id number]) as of 01/09/2020 14:29  Ref. Range 10/05/2018 09:45 12/28/2018 09:53 03/22/2019 09:55 06/14/2019 09:39 07/12/2019 14:02 10/04/2019 11:13 11/28/2019 08:35 01/09/2020 14:08  Platelets Latest Ref Range: 150 - 400 K/uL 122 (L) 111 (L) 136 (L) 144 (L) 123 (L) 151 124.0 (L) 124 (L)  Results for ELAZAR, ARGABRIGHT" (MRN [id number]) as of 01/09/2020 14:29  Ref. Range 10/05/2018 09:45 12/28/2018 09:53 03/22/2019 09:55 06/14/2019 09:39 07/12/2019 14:02 10/04/2019 11:13 11/28/2019 08:35 01/09/2020 14:08  WBC Latest Ref Range: 4.0 - 10.5 K/uL 2.7 (L) 2.8 (L) 3.3 (L) 2.7 (L) 3.3 (L) 2.7 (L) 3.0 (L) 3.4 (L)  Results for TRAVUS, OREN" (MRN [id number]) as of 01/09/2020 14:29  Ref. Range 10/05/2018 09:45 12/28/2018 09:53 03/22/2019 09:55 06/14/2019 09:39 07/12/2019  14:02 10/04/2019 11:13 11/28/2019 08:35 01/09/2020 14:08  Hemoglobin Latest Ref Range: 13.0 - 17.0 g/dL 11.9 (L) 13.2 14.6 11.6 (L) 11.9 (L) 9.7 (L) 14.2 14.1    REVIEW OF SYSTEMS: John Holder I received both doses of the Pfizer vaccine and is scheduled for his booster next week.  He has no symptoms related to his anemia or low counts and plays tennis twice a week, walks his dog twice a day.  He recently went to a very large family reunion with more than 39 people involved.  It is not clear what kind of Covid exposure may have been but so far he has had no symptoms.  He has had no overt bleeding or bruising.  A detailed review of systems today was otherwise stable.     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 He does have cardiac issues and brought this to the attention of his cardiologist Liam Rogers and his PCP Burnard Bunting. The patient was started on lasix empirically, w/o improvement.  Yesterday he presented to the ED with SOB symptoms and was found to have a Hb of 6.o with an MCV of 76.7. WBC was 2.6, platelets 156K. Note on 05/26/2018 Hb was 15.1 with a normal MCV.  Admission labs showed retics 40.5, normal B-12 and folate, normal CRP, normal LDH and negative DAT. Ferritin however was 7 and iron saturation 2%  He was transfused and admitted for further evaluation  The patient's subsequent  history is as detailed below.   PAST MEDICAL HISTORY: Past Medical History:  Diagnosis Date  . Blood transfusion without reported diagnosis   . Cataract    removed in replace bilateral  . GERD (gastroesophageal reflux disease)   . Hypertension   . Iron deficiency anemia    amdx 08/2018 >> Tx with PRBCs and Feraheme started; no GI source per workup; followed by Heme (Dr. Jana Hakim) and GI (Dr. Cristina Gong)   . Left renal mass    s/p L  nephrectomy  . Mitral regurgitation    Echo 02/2016:  Mild conc LVH, EF 55-60, mild to mod MR, mod LAE, mod to severe RAE, mod TR, PASP 41 // Echo 10/2018: EF 60-65, mod conc LVH, severe BAE, trivial eff, mild MVP with mod MR, mod to severe TR, asc aorta 36 (mild dilation)   . Persistent Atrial Fibrillation     PAST SURGICAL HISTORY: Past Surgical History:  Procedure Laterality Date  . CARDIOVERSION N/A 02/04/2016   Procedure: CARDIOVERSION;  Surgeon: Thayer Headings, MD;  Location: [facility] ENDOSCOPY;  Service: Cardiovascular;  Laterality: N/A;  . COLONOSCOPY    . COLONOSCOPY WITH PROPOFOL N/A 09/10/2018   Procedure: COLONOSCOPY WITH PROPOFOL;  Surgeon: Ronald Lobo, MD;  Location: [facility] ENDOSCOPY;  Service: Endoscopy;  Laterality: N/A;  . ESOPHAGOGASTRODUODENOSCOPY (EGD) WITH PROPOFOL N/A 09/10/2018   Procedure: ESOPHAGOGASTRODUODENOSCOPY (EGD) WITH PROPOFOL;  Surgeon: Ronald Lobo, MD;  Location: [facility] ENDOSCOPY;  Service: Endoscopy;  Laterality: N/A;  . GIVENS CAPSULE STUDY N/A 09/10/2018   Procedure: GIVENS CAPSULE STUDY;  Surgeon: Ronald Lobo, MD;  Location: [facility] ENDOSCOPY;  Service: Endoscopy;  Laterality: N/A;    FAMILY HISTORY: Family History  Problem Relation Age of Onset  . Breast cancer Mother   . Hypertension Father   . Diabetes Neg Hx   . Stroke Neg Hx   . CAD Neg Hx   . Colon cancer Neg Hx   . Colon polyps Neg Hx   . Esophageal cancer Neg Hx   . Rectal cancer Neg Hx   . 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 Carmie Kanner. Recently moved, with "lots of exercise carrying boxes."    ADVANCED DIRECTIVES: not in place; plans to name Ms. Aichele as his HCPOA with his son Verdie Barrows as secondary; Remo Lipps lives in [place] and works as a Geophysicist/field seismologist   HEALTH MAINTENANCE: Social History   Tobacco Use  . Smoking status: Never Smoker  . Smokeless tobacco: Never Used   Vaping Use  . Vaping Use: Never used  Substance Use Topics  . Alcohol use: Yes    Alcohol/week: 0.0 standard drinks    Comment: occasional  . Drug use: No     Colonoscopy: [facility], "about 2013;" does not recall MD's name  Bone density:    No Known Allergies  Current Outpatient Medications  Medication Sig Dispense Refill  . ALPRAZolam (XANAX) 0.25 MG tablet Take 0.25 mg by mouth as needed for anxiety.   1  . Apoaequorin (PREVAGEN EXTRA STRENGTH PO) Takes once daily    . Barberry-Oreg Grape-Goldenseal (BERBERINE COMPLEX PO) Take 600 mg by mouth daily. Takes 2 capsule once daily    . Chromium Picolinate 400 MCG TABS Takes once tablet twice daily    . ciclopirox (PENLAC) 8 % solution     . Coenzyme Q10 (CO Q10) 200 MG CAPS Takes one capsule once  daily    . ELIQUIS 5 MG TABS tablet Take 5 mg by mouth 2 (two) times daily.     . ferrous gluconate (FERGON) 324 MG tablet Take 324 mg by mouth daily with breakfast.    . Garlic 0093 MG CAPS Takes one capsule twice daily    . glucosamine-chondroitin 500-400 MG tablet Take 1 tablet by mouth 2 (two) times daily.     . hydrochlorothiazide (HYDRODIURIL) 25 MG tablet Take 25 mg by mouth daily.    . Linoleic Acid Conjugated 1000 MG CAPS Takes one capsule twice daily    . losartan (COZAAR) 50 MG tablet TAKE 1 TABLET BY MOUTH EVERY DAY 90 tablet 3  . Magnesium Glycinate 665 MG CAPS Takes one capsule twice daily    . Melatonin 5 MG TABS Takes one tablet at bedtime    . metoprolol succinate (TOPROL-XL) 50 MG 24 hr tablet Take 1 tablet (50 mg total) by mouth 2 (two) times daily. Take with or immediately following a meal. 180 tablet 1  . Multiple Vitamin (MULTIVITAMIN) capsule Take 1 capsule by mouth daily. Takes two tablets once daily    . omeprazole (PRILOSEC) 20 MG capsule Take 20 mg by mouth daily.    [name] OVER THE COUNTER MEDICATION Uses 3 different types of Herbal combinations for prostate, one for urinary care and one for liver care. It's 3 separate  bottles    . potassium chloride (KLOR-CON) 10 MEQ tablet Take 10 mEq by mouth daily.     No current facility-administered medications for this visit.    OBJECTIVE: White man who appears well  Vitals:   01/09/20 1432  BP: 131/74  Pulse: 68  Resp: 18  Temp: 97.8 F (36.6 C)  SpO2: 100%     Body mass index is 23.57 kg/m.   Wt Readings from Last 3 Encounters:  01/09/20 169 lb (76.7 kg)  12/03/19 168 lb 3.2 oz (76.3 kg)  10/31/19 167 lb (75.8 kg)      ECOG FS:1 - Symptomatic but completely ambulatory  Sclerae unicteric, EOMs intact Wearing a mask No cervical or supraclavicular adenopathy Lungs no rales or rhonchi Heart regular rate and rhythm Abd soft, nontender, positive bowel sounds MSK no focal spinal tenderness, no upper extremity lymphedema Neuro: nonfocal, well oriented, appropriate affect  LAB RESULTS:  CMP     Component Value Date/Time   NA 139 12/19/2018 1120   K 4.1 12/19/2018 1120   CL 99 12/19/2018 1120   CO2 24 12/19/2018 1120   GLUCOSE 56 (L) 12/19/2018 1120   GLUCOSE 108 (H) 09/12/2018 0635   BUN 19 12/19/2018 1120   CREATININE 1.05 12/19/2018 1120   CREATININE 1.01 02/02/2016 1542   CALCIUM 9.4 12/19/2018 1120   PROT 5.7 (L) 09/09/2018 0532   ALBUMIN 3.7 09/09/2018 0532   AST 24 09/09/2018 0532   ALT 16 09/09/2018 0532   ALKPHOS 57 09/09/2018 0532   BILITOT 1.4 (H) 09/09/2018 0532   GFRNONAA 65 12/19/2018 1120   GFRAA 75 12/19/2018 1120    Lab Results  Component Value Date   TOTALPROTELP 5.2 (L) 09/09/2018   ALBUMINELP 3.4 09/09/2018   A1GS 0.2 09/09/2018   A2GS 0.5 09/09/2018   BETS 0.6 (L) 09/09/2018   GAMS 0.5 09/09/2018   MSPIKE Not Observed 09/09/2018   SPEI Comment 09/09/2018    Lab Results  Component Value Date   WBC 3.4 (L) 01/09/2020   NEUTROABS 2.1 01/09/2020   HGB 14.1 01/09/2020   HCT 42.5 01/09/2020  MCV 94.2 01/09/2020   PLT 124 (L) 01/09/2020    No results found for: LABCA2  No components found for:  XLKGMW102  No results for input(s): INR in the last 168 hours.  No results found for: LABCA2  No results found for: VOZ366  No results found for: YQI347  No results found for: QQV956  No results found for: CA2729  No components found for: HGQUANT  No results found for: CEA1 / No results found for: CEA1   No results found for: AFPTUMOR  No results found for: CHROMOGRNA  No results found for: KPAFRELGTCHN, LAMBDASER, KAPLAMBRATIO (kappa/lambda light chains)  No results found for: HGBA, HGBA2QUANT, HGBFQUANT, HGBSQUAN (Hemoglobinopathy evaluation)   Lab Results  Component Value Date   LDH 165 09/08/2018    Lab Results  Component Value Date   IRON 19 (L) 10/04/2019   TIBC 348 10/04/2019   IRONPCTSAT 5 (L) 10/04/2019   (Iron and TIBC)  Lab Results  Component Value Date   FERRITIN 36.0 11/28/2019    Urinalysis    Component Value Date/Time   COLORURINE YELLOW 09/09/2018 1050   APPEARANCEUR CLEAR 09/09/2018 1050   LABSPEC 1.036 (H) 09/09/2018 1050   PHURINE 5.0 09/09/2018 1050   GLUCOSEU NEGATIVE 09/09/2018 [place] 09/09/2018 [place] 09/09/2018 1050   KETONESUR NEGATIVE 09/09/2018 [place] 09/09/2018 1050   NITRITE NEGATIVE 09/09/2018 [place] 09/09/2018 1050     STUDIES: No results found.   ELIGIBLE FOR AVAILABLE RESEARCH PROTOCOL: no  ASSESSMENT: 86 y.o. [place] man admitted 09/08/2018 with severe iron deficiency anemia  (a) status post Feraheme 09/09/2018 and 09/14/2018  (b) continues on ferrous gluconate daily  (c) GI work-up ongoing  PLAN: John Holder has corrected his iron deficiency and is tolerating oral iron well.  The plan is to continue that indefinitely.  We have not been able to identify a bleeding source.  He is being actively worked up by [employer].  Frequently and patient is on anticoagulants there is a very small lesion somewhere which is minimally leaking but  does not over time and these are very hard to detect.  He is hoping to get through a second capsule endoscopy to see if a lesion previously noted in the small bowel is persistent or not  I do not have a simple explanation for his low white cell count and platelet count except to say the white cell count appears to be improving and the platelet count is stable.  He does not have splenomegaly by his recent CT scan.  There is no findings suggestive of leukemia or dysplasia on his blood film.  Accordingly we are simply continuing observation  He will return in 6 months for labs and in 12 months for labs and a visit.  Total encounter time 20 minutes.Sarajane Jews C. Marrah Vanevery, MD 01/09/2020 2:54 PM Medical Oncology and Hematology [facility] [place], [place] [address] Tel. [phone]    Fax. [phone]   This document serves as a record of services personally performed by Lurline Del, MD. It was created on his behalf by Wilburn Mylar, a trained medical scribe. The creation of this record is based on the scribe's personal observations and the provider's statements to them.   I, Lurline Del MD, have reviewed the above documentation for accuracy and completeness, and I agree with the above.   *Total Encounter Time as defined by the Centers for Medicare  and Medicaid Services includes, in addition to the face-to-face time of a patient visit (documented in the note above) non-face-to-face time: obtaining and reviewing outside history, ordering and reviewing medications, tests or procedures, care coordination (communications with other health care professionals or caregivers) and documentation in the medical record.

## 2020-01-10 ENCOUNTER — Telehealth: Payer: Self-pay | Admitting: Oncology

## 2020-01-10 NOTE — Telephone Encounter (Signed)
Scheduled per 8/25 los. Called and left a msg, mailing appt letter and calendar printout

## 2020-01-15 ENCOUNTER — Ambulatory Visit: Payer: Medicare PPO | Attending: Critical Care Medicine

## 2020-01-15 DIAGNOSIS — Z23 Encounter for immunization: Secondary | ICD-10-CM

## 2020-01-15 NOTE — Progress Notes (Signed)
   Covid-19 Vaccination Clinic  Name:  John Holder    MRN: [id number] DOB: 17-Apr-1932  01/15/2020  John Holder was observed post Covid-19 immunization for 15 minutes without incident. He was provided with Vaccine Information Sheet and instruction to access the V-Safe system.   John Holder was instructed to call 911 with any severe reactions post vaccine: [name] Difficulty breathing  . Swelling of face and throat  . A fast heartbeat  . A bad rash all over body  . Dizziness and weakness

## 2020-01-22 ENCOUNTER — Other Ambulatory Visit: Payer: Self-pay

## 2020-01-22 ENCOUNTER — Ambulatory Visit (HOSPITAL_COMMUNITY)
Admission: RE | Admit: 2020-01-22 | Discharge: 2020-01-22 | Disposition: A | Discharge status: Home / Self Care | Payer: Medicare PPO | Source: Ambulatory Visit | Attending: Internal Medicine | Admitting: Internal Medicine

## 2020-01-22 DIAGNOSIS — K7460 Unspecified cirrhosis of liver: Secondary | ICD-10-CM | POA: Insufficient documentation

## 2020-01-22 DIAGNOSIS — K862 Cyst of pancreas: Secondary | ICD-10-CM | POA: Insufficient documentation

## 2020-01-22 DIAGNOSIS — N2889 Other specified disorders of kidney and ureter: Secondary | ICD-10-CM | POA: Diagnosis present

## 2020-01-22 MED ORDER — GADOBUTROL 1 MMOL/ML IV SOLN
7.0000 mL | Freq: Once | INTRAVENOUS | Status: AC | PRN
  Administered 2020-01-22: 7 mL via INTRAVENOUS

## 2020-02-07 ENCOUNTER — Other Ambulatory Visit: Payer: Self-pay | Admitting: Cardiovascular Disease

## 2020-02-07 ENCOUNTER — Other Ambulatory Visit (HOSPITAL_COMMUNITY): Payer: Self-pay | Admitting: Internal Medicine

## 2020-02-11 ENCOUNTER — Other Ambulatory Visit: Payer: Self-pay | Admitting: Internal Medicine

## 2020-03-07 ENCOUNTER — Other Ambulatory Visit: Payer: Self-pay | Admitting: Urology

## 2020-03-20 DIAGNOSIS — M25562 Pain in left knee: Secondary | ICD-10-CM | POA: Diagnosis not present

## 2020-03-20 DIAGNOSIS — M170 Bilateral primary osteoarthritis of knee: Secondary | ICD-10-CM | POA: Diagnosis not present

## 2020-03-20 DIAGNOSIS — M25561 Pain in right knee: Secondary | ICD-10-CM | POA: Diagnosis not present

## 2020-03-26 ENCOUNTER — Other Ambulatory Visit (HOSPITAL_COMMUNITY): Payer: Self-pay | Admitting: Orthopedic Surgery

## 2020-03-26 DIAGNOSIS — M546 Pain in thoracic spine: Secondary | ICD-10-CM | POA: Diagnosis not present

## 2020-03-29 DIAGNOSIS — M546 Pain in thoracic spine: Secondary | ICD-10-CM | POA: Diagnosis not present

## 2020-04-04 DIAGNOSIS — M546 Pain in thoracic spine: Secondary | ICD-10-CM | POA: Diagnosis not present

## 2020-04-17 DIAGNOSIS — M25561 Pain in right knee: Secondary | ICD-10-CM | POA: Diagnosis not present

## 2020-04-17 DIAGNOSIS — M25562 Pain in left knee: Secondary | ICD-10-CM | POA: Diagnosis not present

## 2020-04-17 DIAGNOSIS — M170 Bilateral primary osteoarthritis of knee: Secondary | ICD-10-CM | POA: Diagnosis not present

## 2020-04-29 ENCOUNTER — Other Ambulatory Visit (HOSPITAL_COMMUNITY): Payer: Self-pay | Admitting: Dermatology

## 2020-04-29 DIAGNOSIS — D1801 Hemangioma of skin and subcutaneous tissue: Secondary | ICD-10-CM | POA: Diagnosis not present

## 2020-04-29 DIAGNOSIS — L814 Other melanin hyperpigmentation: Secondary | ICD-10-CM | POA: Diagnosis not present

## 2020-04-29 DIAGNOSIS — D0462 Carcinoma in situ of skin of left upper limb, including shoulder: Secondary | ICD-10-CM | POA: Diagnosis not present

## 2020-04-29 DIAGNOSIS — Z85828 Personal history of other malignant neoplasm of skin: Secondary | ICD-10-CM | POA: Diagnosis not present

## 2020-04-29 DIAGNOSIS — L309 Dermatitis, unspecified: Secondary | ICD-10-CM | POA: Diagnosis not present

## 2020-04-29 DIAGNOSIS — D225 Melanocytic nevi of trunk: Secondary | ICD-10-CM | POA: Diagnosis not present

## 2020-04-29 DIAGNOSIS — L821 Other seborrheic keratosis: Secondary | ICD-10-CM | POA: Diagnosis not present

## 2020-05-15 ENCOUNTER — Other Ambulatory Visit: Payer: Self-pay | Admitting: Cardiovascular Disease

## 2020-06-10 DIAGNOSIS — M25561 Pain in right knee: Secondary | ICD-10-CM | POA: Diagnosis not present

## 2020-06-10 DIAGNOSIS — M170 Bilateral primary osteoarthritis of knee: Secondary | ICD-10-CM | POA: Diagnosis not present

## 2020-06-10 DIAGNOSIS — M25562 Pain in left knee: Secondary | ICD-10-CM | POA: Diagnosis not present

## 2020-06-15 ENCOUNTER — Encounter: Payer: Self-pay | Admitting: Cardiovascular Disease
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
## 2020-06-16 ENCOUNTER — Encounter: Payer: Self-pay | Admitting: Cardiovascular Disease

## 2020-06-16 ENCOUNTER — Ambulatory Visit: Payer: Medicare PPO | Admitting: Cardiovascular Disease

## 2020-06-16 ENCOUNTER — Other Ambulatory Visit: Payer: Self-pay

## 2020-06-16 VITALS — BP 128/62 | HR 76 | Ht 71.0 in | Wt 175.6 lb

## 2020-06-16 DIAGNOSIS — I4821 Permanent atrial fibrillation: Secondary | ICD-10-CM | POA: Diagnosis not present

## 2020-06-16 DIAGNOSIS — I10 Essential (primary) hypertension: Secondary | ICD-10-CM

## 2020-06-16 DIAGNOSIS — I482 Chronic atrial fibrillation, unspecified: Secondary | ICD-10-CM | POA: Diagnosis not present

## 2020-06-16 NOTE — Patient Instructions (Signed)
Medication Instructions:  Your physician recommends that you continue on your current medications as directed. Please refer to the Current Medication list given to you today.  *If you need a refill on your cardiac medications before your next appointment, please call your pharmacy*   Lab Work: Today( Basic Metabolic panel) If you have labs (blood work) drawn today and your tests are completely normal, you will receive your results only by: [name] MyChart Message (if you have MyChart) OR . A paper copy in the mail If you have any lab test that is abnormal or we need to change your treatment, we will call you to review the results.   Testing/Procedures: None ordered    Follow-Up: At [facility], you and your health needs are our priority.  As part of our continuing mission to provide you with exceptional heart care, we have created designated Provider Care Teams.  These Care Teams include your primary Cardiologist (physician) and Advanced Practice Providers (APPs -  Physician Assistants and Nurse Practitioners) who all work together to provide you with the care you need, when you need it.   Your next appointment:   1 year(s)  The format for your next appointment:   In Person  Provider:   You may see Mertie Moores, MD or one of the following Advanced Practice Providers on your designated Care Team:    Richardson Dopp, PA-C  [place], [place]

## 2020-06-17 LAB — BASIC METABOLIC PANEL
BUN/Creatinine Ratio: 20 (ref 10–24)
BUN: 21 mg/dL (ref 8–27)
CO2: 27 mmol/L (ref 20–29)
Calcium: 8.9 mg/dL (ref 8.6–10.2)
Chloride: 98 mmol/L (ref 96–106)
Creatinine, Ser: 1.04 mg/dL (ref 0.76–1.27)
GFR calc Af Amer: 75 mL/min/{1.73_m2} (ref >59)
GFR calc non Af Amer: 65 mL/min/{1.73_m2} (ref >59)
Glucose: 113 mg/dL — ABNORMAL HIGH (ref 65–99)
Potassium: 4.3 mmol/L (ref 3.5–5.2)
Sodium: 136 mmol/L (ref 134–144)

## 2020-07-01 DIAGNOSIS — H532 Diplopia: Secondary | ICD-10-CM | POA: Diagnosis not present

## 2020-07-01 DIAGNOSIS — Z961 Presence of intraocular lens: Secondary | ICD-10-CM | POA: Diagnosis not present

## 2020-07-11 ENCOUNTER — Inpatient Hospital Stay: Payer: Medicare PPO | Attending: Oncology

## 2020-07-11 ENCOUNTER — Other Ambulatory Visit: Payer: Self-pay

## 2020-07-11 DIAGNOSIS — D509 Iron deficiency anemia, unspecified: Secondary | ICD-10-CM | POA: Diagnosis not present

## 2020-07-11 DIAGNOSIS — D508 Other iron deficiency anemias: Secondary | ICD-10-CM

## 2020-07-11 DIAGNOSIS — D61818 Other pancytopenia: Secondary | ICD-10-CM | POA: Diagnosis not present

## 2020-07-11 DIAGNOSIS — D649 Anemia, unspecified: Secondary | ICD-10-CM

## 2020-07-11 LAB — CBC WITH DIFFERENTIAL/PLATELET
Abs Immature Granulocytes: 0.01 10*3/uL (ref 0.00–0.07)
Basophils Absolute: 0 10*3/uL (ref 0.0–0.1)
Basophils Relative: 1 %
Eosinophils Absolute: 0.1 10*3/uL (ref 0.0–0.5)
Eosinophils Relative: 3 %
HCT: 41 % (ref 39.0–52.0)
Hemoglobin: 14 g/dL (ref 13.0–17.0)
Immature Granulocytes: 0 %
Lymphocytes Relative: 17 %
Lymphs Abs: 0.6 10*3/uL — ABNORMAL LOW (ref 0.7–4.0)
MCH: 32.3 pg (ref 26.0–34.0)
MCHC: 34.1 g/dL (ref 30.0–36.0)
MCV: 94.7 fL (ref 80.0–100.0)
Monocytes Absolute: 0.4 10*3/uL (ref 0.1–1.0)
Monocytes Relative: 12 %
Neutro Abs: 2.2 10*3/uL (ref 1.7–7.7)
Neutrophils Relative %: 67 %
Platelets: 107 10*3/uL — ABNORMAL LOW (ref 150–400)
RBC: 4.33 MIL/uL (ref 4.22–5.81)
RDW: 13.1 % (ref 11.5–15.5)
WBC: 3.3 10*3/uL — ABNORMAL LOW (ref 4.0–10.5)
nRBC: 0 % (ref 0.0–0.2)

## 2020-07-11 LAB — RETICULOCYTES
Immature Retic Fract: 7.9 % (ref 2.3–15.9)
RBC.: 4.43 MIL/uL (ref 4.22–5.81)
Retic Count, Absolute: 75.3 10*3/uL (ref 19.0–186.0)
Retic Ct Pct: 1.7 % (ref 0.4–3.1)

## 2020-07-14 ENCOUNTER — Other Ambulatory Visit: Payer: Self-pay | Admitting: Oncology

## 2020-07-14 ENCOUNTER — Encounter: Payer: Self-pay | Admitting: Oncology

## 2020-07-14 LAB — IRON AND TIBC
Iron: 98 ug/dL (ref 42–163)
Saturation Ratios: 33 % (ref 20–55)
TIBC: 299 ug/dL (ref 202–409)
UIBC: 201 ug/dL (ref 117–376)

## 2020-07-14 LAB — FERRITIN: Ferritin: 80 ng/mL (ref 24–336)

## 2020-07-29 DIAGNOSIS — M170 Bilateral primary osteoarthritis of knee: Secondary | ICD-10-CM | POA: Diagnosis not present

## 2020-08-05 DIAGNOSIS — M170 Bilateral primary osteoarthritis of knee: Secondary | ICD-10-CM | POA: Diagnosis not present

## 2020-08-21 DIAGNOSIS — M170 Bilateral primary osteoarthritis of knee: Secondary | ICD-10-CM | POA: Diagnosis not present

## 2020-08-25 ENCOUNTER — Other Ambulatory Visit (HOSPITAL_COMMUNITY): Payer: Self-pay

## 2020-08-25 DIAGNOSIS — K7469 Other cirrhosis of liver: Secondary | ICD-10-CM | POA: Diagnosis not present

## 2020-08-25 DIAGNOSIS — R7301 Impaired fasting glucose: Secondary | ICD-10-CM | POA: Diagnosis not present

## 2020-08-25 DIAGNOSIS — I10 Essential (primary) hypertension: Secondary | ICD-10-CM | POA: Diagnosis not present

## 2020-08-25 DIAGNOSIS — R972 Elevated prostate specific antigen [PSA]: Secondary | ICD-10-CM | POA: Diagnosis not present

## 2020-08-25 DIAGNOSIS — N2889 Other specified disorders of kidney and ureter: Secondary | ICD-10-CM | POA: Diagnosis not present

## 2020-08-25 DIAGNOSIS — I509 Heart failure, unspecified: Secondary | ICD-10-CM | POA: Diagnosis not present

## 2020-08-25 DIAGNOSIS — I482 Chronic atrial fibrillation, unspecified: Secondary | ICD-10-CM | POA: Diagnosis not present

## 2020-08-25 DIAGNOSIS — I272 Pulmonary hypertension, unspecified: Secondary | ICD-10-CM | POA: Diagnosis not present

## 2020-08-25 MED FILL — Apixaban Tab 5 MG: ORAL | 90 days supply | Qty: 180 | Fill #0 | Status: AC

## 2020-08-25 MED FILL — Losartan Potassium Tab 50 MG: ORAL | 90 days supply | Qty: 90 | Fill #0 | Status: AC

## 2020-08-25 MED FILL — Metoprolol Succinate Tab ER 24HR 50 MG (Tartrate Equiv): ORAL | 90 days supply | Qty: 180 | Fill #0 | Status: AC

## 2020-09-19 DIAGNOSIS — Z85828 Personal history of other malignant neoplasm of skin: Secondary | ICD-10-CM | POA: Diagnosis not present

## 2020-09-19 DIAGNOSIS — D0421 Carcinoma in situ of skin of right ear and external auricular canal: Secondary | ICD-10-CM | POA: Diagnosis not present

## 2020-10-09 ENCOUNTER — Other Ambulatory Visit: Payer: Self-pay | Admitting: Cardiovascular Disease

## 2020-10-09 ENCOUNTER — Other Ambulatory Visit (HOSPITAL_COMMUNITY): Payer: Self-pay

## 2020-10-09 MED ORDER — LOSARTAN POTASSIUM 50 MG PO TABS
ORAL_TABLET | Freq: Every day | ORAL | 1 refills | Status: DC
  Filled 2020-10-09 – 2021-03-05 (×2): qty 90, 90d supply, fill #0

## 2020-10-09 MED ORDER — METOPROLOL SUCCINATE ER 50 MG PO TB24
ORAL_TABLET | ORAL | 1 refills | Status: DC
  Filled 2020-10-09: qty 180, 90d supply, fill #0
  Filled 2021-01-13: qty 180, 90d supply, fill #1
  Filled 2021-01-14: qty 180, 90d supply, fill #0

## 2020-10-09 MED ORDER — ELIQUIS 5 MG PO TABS
1.0000 | ORAL_TABLET | Freq: Two times a day (BID) | ORAL | 3 refills | Status: DC
Start: 2020-10-09 — End: 2021-01-27
  Filled 2020-10-09: qty 180, 90d supply, fill #0

## 2020-10-15 ENCOUNTER — Other Ambulatory Visit (HOSPITAL_COMMUNITY): Payer: Self-pay

## 2020-10-15 MED FILL — Potassium Chloride Tab ER 10 mEq: ORAL | 90 days supply | Qty: 90 | Fill #0 | Status: AC

## 2020-10-15 MED FILL — Hydrochlorothiazide Tab 25 MG: ORAL | 90 days supply | Qty: 90 | Fill #0 | Status: AC

## 2020-10-28 DIAGNOSIS — Z85828 Personal history of other malignant neoplasm of skin: Secondary | ICD-10-CM | POA: Diagnosis not present

## 2020-10-28 DIAGNOSIS — D2261 Melanocytic nevi of right upper limb, including shoulder: Secondary | ICD-10-CM | POA: Diagnosis not present

## 2020-10-28 DIAGNOSIS — L814 Other melanin hyperpigmentation: Secondary | ICD-10-CM | POA: Diagnosis not present

## 2020-10-28 DIAGNOSIS — D1801 Hemangioma of skin and subcutaneous tissue: Secondary | ICD-10-CM | POA: Diagnosis not present

## 2020-10-28 DIAGNOSIS — D225 Melanocytic nevi of trunk: Secondary | ICD-10-CM | POA: Diagnosis not present

## 2020-10-28 DIAGNOSIS — L821 Other seborrheic keratosis: Secondary | ICD-10-CM | POA: Diagnosis not present

## 2020-10-28 DIAGNOSIS — L570 Actinic keratosis: Secondary | ICD-10-CM | POA: Diagnosis not present

## 2020-11-10 ENCOUNTER — Encounter: Payer: Self-pay | Admitting: Oncology

## 2020-11-12 ENCOUNTER — Other Ambulatory Visit: Payer: Self-pay

## 2020-11-12 ENCOUNTER — Other Ambulatory Visit: Payer: Self-pay | Admitting: Oncology

## 2020-11-12 ENCOUNTER — Other Ambulatory Visit: Payer: Self-pay | Admitting: *Deleted

## 2020-11-12 ENCOUNTER — Inpatient Hospital Stay: Payer: Medicare PPO | Attending: Oncology

## 2020-11-12 DIAGNOSIS — D500 Iron deficiency anemia secondary to blood loss (chronic): Secondary | ICD-10-CM

## 2020-11-12 DIAGNOSIS — D508 Other iron deficiency anemias: Secondary | ICD-10-CM | POA: Diagnosis not present

## 2020-11-12 DIAGNOSIS — D649 Anemia, unspecified: Secondary | ICD-10-CM

## 2020-11-12 LAB — CMP (CANCER CENTER ONLY)
ALT: 18 U/L (ref 0–44)
AST: 32 U/L (ref 15–41)
Albumin: 3.9 g/dL (ref 3.5–5.0)
Alkaline Phosphatase: 77 U/L (ref 38–126)
Anion gap: 9 (ref 5–15)
BUN: 20 mg/dL (ref 8–23)
CO2: 25 mmol/L (ref 22–32)
Calcium: 9 mg/dL (ref 8.9–10.3)
Chloride: 100 mmol/L (ref 98–111)
Creatinine: 1.08 mg/dL (ref 0.61–1.24)
GFR, Estimated: >60 mL/min (ref >60)
Glucose, Bld: 121 mg/dL — ABNORMAL HIGH (ref 70–99)
Potassium: 4.2 mmol/L (ref 3.5–5.1)
Sodium: 134 mmol/L — ABNORMAL LOW (ref 135–145)
Total Bilirubin: 1.1 mg/dL (ref 0.3–1.2)
Total Protein: 6.5 g/dL (ref 6.5–8.1)

## 2020-11-12 LAB — CBC WITH DIFFERENTIAL (CANCER CENTER ONLY)
Abs Immature Granulocytes: 0 10*3/uL (ref 0.00–0.07)
Basophils Absolute: 0 10*3/uL (ref 0.0–0.1)
Basophils Relative: 0 %
Eosinophils Absolute: 0.1 10*3/uL (ref 0.0–0.5)
Eosinophils Relative: 4 %
HCT: 32.6 % — ABNORMAL LOW (ref 39.0–52.0)
Hemoglobin: 10.3 g/dL — ABNORMAL LOW (ref 13.0–17.0)
Immature Granulocytes: 0 %
Lymphocytes Relative: 22 %
Lymphs Abs: 0.6 10*3/uL — ABNORMAL LOW (ref 0.7–4.0)
MCH: 28.1 pg (ref 26.0–34.0)
MCHC: 31.6 g/dL (ref 30.0–36.0)
MCV: 88.8 fL (ref 80.0–100.0)
Monocytes Absolute: 0.4 10*3/uL (ref 0.1–1.0)
Monocytes Relative: 16 %
Neutro Abs: 1.6 10*3/uL — ABNORMAL LOW (ref 1.7–7.7)
Neutrophils Relative %: 58 %
Platelet Count: 136 10*3/uL — ABNORMAL LOW (ref 150–400)
RBC: 3.67 MIL/uL — ABNORMAL LOW (ref 4.22–5.81)
RDW: 14.6 % (ref 11.5–15.5)
WBC Count: 2.7 10*3/uL — ABNORMAL LOW (ref 4.0–10.5)
nRBC: 0 % (ref 0.0–0.2)

## 2020-11-12 LAB — RETIC PANEL
Immature Retic Fract: 22.4 % — ABNORMAL HIGH (ref 2.3–15.9)
RBC.: 3.59 MIL/uL — ABNORMAL LOW (ref 4.22–5.81)
Retic Count, Absolute: 86.2 10*3/uL (ref 19.0–186.0)
Retic Ct Pct: 2.4 % (ref 0.4–3.1)
Reticulocyte Hemoglobin: 24.9 pg — ABNORMAL LOW (ref >27.9)

## 2020-11-12 NOTE — Progress Notes (Signed)
I called John Holder and let him know that he needs a little bit of iron.  I entered in the orders and the LOS and they will contact him to schedule.

## 2020-11-13 ENCOUNTER — Telehealth: Payer: Self-pay | Admitting: Oncology

## 2020-11-13 LAB — IRON AND TIBC
Iron: 22 ug/dL — ABNORMAL LOW (ref 42–163)
Saturation Ratios: 6 % — ABNORMAL LOW (ref 20–55)
TIBC: 384 ug/dL (ref 202–409)
UIBC: 361 ug/dL (ref 117–376)

## 2020-11-13 LAB — FERRITIN: Ferritin: 25 ng/mL (ref 24–336)

## 2020-11-13 NOTE — Telephone Encounter (Signed)
Scheduled appts per 6/29 sch msg. Called pt, no answer. Left msg with appts dates and times.

## 2020-11-24 NOTE — Telephone Encounter (Signed)
Please contact pt and review his symptoms for acute issues.  Please arrange an appt with Dr. Acie Fredrickson, me or any other APP this week.  Richardson Dopp, PA-C    11/24/2020 9:53 AM

## 2020-11-25 ENCOUNTER — Other Ambulatory Visit (HOSPITAL_COMMUNITY): Payer: Self-pay

## 2020-11-25 ENCOUNTER — Other Ambulatory Visit: Payer: Self-pay | Admitting: Oncology

## 2020-11-25 MED FILL — Omeprazole Cap Delayed Release 20 MG: ORAL | 90 days supply | Qty: 90 | Fill #0 | Status: CN

## 2020-11-25 NOTE — Progress Notes (Addendum)
Cardiology Office Note:    Date:  11/26/2020   ID:  [name], DOB 07-25-1931, MRN [id number]  PCP:  John Bunting, MD   [facility] Providers Cardiologist:  John Moores, MD     Hematologist:  Dr. Jana Holder Gastroenterologist:  Dr. Amedeo Holder   Referring MD: John Bunting, MD   Chief Complaint:  Leg Swelling    Patient Profile:    John Holder is a 87 y.o. male with:  Permanent Atrial fibrillation  CHA2DS2-VASc=3 (age x 2, HTN) >> Apixaban   Hypertension S/p L nephrectomy 2/2 renal mass R renal mass Iron deficiency anemia ?cirrhosis (CT 08/2018 w/ changes suspicious for cirrhosis) Aortic atherosclerosis  Mitral regurgitation Tricuspid regurgitation   Prior CV studies: Echocardiogram 10/16/2018 EF 60-65, moderate concentric LVH, no RWMA, normal RVSF, severe BAE, trivial pericardial effusion, mild mitral valve prolapse with moderate MR, moderate-severe TR, mild dilation of ascending aorta (36 mm)   LONG TERM MONITOR(3-7 DAYS) HOOK UP AND INTERPRETATION 08/28/2018 Narrative Persistent atrial fibrillation, without episodes of sinus rhythm Frequent rapid ventricular rates are noted (average heart rate 96 bpm with range 55-208 bpm) Rare premature ventricular contractions are observed with 3 episodes of nonsustained ventricular tachycardia (longest 4 beats)  Echo 02/18/16 Mild conc LVH, EF 55-60, mild to mod MR, mod LAE, mod to severe RAE, mod TR, PASP 41   History of Present Illness: Mr. John Holder was last seen by Dr. Acie Holder in 1/22.  He called in recently with leg edema and is added on for evaluation.  He is here alone.  Over the past month, he has been more short of breath.  He was recently seen by Dr. Jana Holder with hematology.  His hemoglobin had decreased to 10.3.  He just had an iron infusion earlier today.  He has not had chest pain, orthopnea.  He did note that his legs were weeping 1 day.  He increased his HCTZ to 2 pills a day with some improvement.  He has not had  syncope.  He has not had cough or wheezing.  He has had some improvement with wearing compression hose as well as with elevating his legs.    Past Medical History:  Diagnosis Date   Blood transfusion without reported diagnosis    Cataract    removed in replace bilateral   GERD (gastroesophageal reflux disease)    Hypertension    Iron deficiency anemia    amdx 08/2018 >> Tx with PRBCs and Feraheme started; no GI source per workup; followed by Heme (Dr. Jana Holder) and GI (John Holder)    Left renal mass    s/p L nephrectomy   Mitral regurgitation    Echo 02/2016:  Mild conc LVH, EF 55-60, mild to mod MR, mod LAE, mod to severe RAE, mod TR, PASP 41 // Echo 10/2018: EF 60-65, mod conc LVH, severe BAE, trivial eff, mild MVP with mod MR, mod to severe TR, asc aorta 36 (mild dilation)    Persistent Atrial Fibrillation     Current Medications: Current Meds  Medication Sig   ALPRAZolam (XANAX) 0.25 MG tablet Take 0.25 mg by mouth as needed for anxiety.    apixaban (ELIQUIS) 5 MG TABS tablet TAKE 1 TABLET BY MOUTH TWICE DAILY   Apoaequorin (PREVAGEN EXTRA STRENGTH PO) Takes once daily   Barberry-Oreg Grape-Goldenseal (BERBERINE COMPLEX PO) Take 600 mg by mouth daily. Takes 2 capsule once daily   Chromium Picolinate 400 MCG TABS Takes once tablet twice daily   ciclopirox (PENLAC) 8 % solution  Coenzyme Q10 (CO Q10) 200 MG CAPS Takes one capsule once daily   ferrous gluconate (FERGON) 324 MG tablet Take 324 mg by mouth daily with breakfast.   furosemide (LASIX) 40 MG tablet Take one tablet by mouth ( 40 mg) twice daily X 3 days than take one tablet by mouth (40 mg) daily.   Garlic 8101 MG CAPS Takes one capsule twice daily   glucosamine-chondroitin 500-400 MG tablet Take 1 tablet by mouth 2 (two) times daily.    KRILL OIL PO Take 1 capsule by mouth daily at 12 noon.   Linoleic Acid Conjugated 1000 MG CAPS Takes one capsule twice daily   losartan (COZAAR) 50 MG tablet TAKE 1 TABLET BY MOUTH EVERY  DAY   Magnesium Glycinate 665 MG CAPS Takes one capsule twice daily   Melatonin 5 MG TABS Takes one tablet at bedtime   methocarbamol (ROBAXIN) 500 MG tablet TAKE 1-2 TABLETS BY MOUTH EVERY SIX TO EIGHT HOURS AS NEEDED FOR SPASMS/MUSCLE TENSION   metoprolol succinate (TOPROL-XL) 50 MG 24 hr tablet TAKE 1 TABLET (50 MG TOTAL) BY MOUTH 2 (TWO) TIMES DAILY. TAKE WITH OR IMMEDIATELY FOLLOWING A MEAL.   Multiple Vitamin (MULTIVITAMIN) capsule Take 1 capsule by mouth daily. Takes two tablets once daily   NON FORMULARY Take 1 capsule by mouth 2 (two) times daily. TRU vitamin   omeprazole (PRILOSEC) 20 MG capsule Take 20 mg by mouth daily.   OVER THE COUNTER MEDICATION Uses 3 different types of Herbal combinations for prostate, one for urinary care and one for liver care. It's 3 separate bottles   tadalafil (CIALIS) 20 MG tablet TAKE ONE TABLET BY MOUTH DAILY AS NEEDED   triamcinolone (KENALOG) 0.1 % APPLY 1 APPLICATION ON THE SKIN TO AFFECTED AREAS TWICE A DAY   [DISCONTINUED] hydrochlorothiazide (HYDRODIURIL) 25 MG tablet TAKE 1 TABLET (25 MG TOTAL) BY MOUTH DAILY.   [DISCONTINUED] potassium chloride (KLOR-CON) 10 MEQ tablet TAKE 1 TABLET (10 MEQ TOTAL) BY MOUTH DAILY.     Allergies:   Patient has no known allergies.   Social History   Tobacco Use   Smoking status: Never   Smokeless tobacco: Never  Vaping Use   Vaping Use: Never used  Substance Use Topics   Alcohol use: Yes    Alcohol/week: 0.0 standard drinks    Comment: occasional   Drug use: No     Family Hx: The patient's family history includes Breast cancer in his mother; Hypertension in his father. There is no history of Diabetes, Stroke, CAD, Colon cancer, Colon polyps, Esophageal cancer, Rectal cancer, or Stomach cancer.  Review of Systems  Constitutional: Positive for weight gain. Negative for chills, fever and night sweats.  Gastrointestinal:  Positive for bloating. Negative for diarrhea, hematochezia, melena, nausea and  vomiting.  Genitourinary:  Negative for hematuria.    EKGs/Labs/Other Test Reviewed:    EKG:  EKG is not ordered today.  The ekg ordered today demonstrates n/a  Recent Labs: 11/12/2020: ALT 18; BUN 20; Creatinine 1.08; Hemoglobin 10.3; Platelet Count 136; Potassium 4.2; Sodium 134   Recent Lipid Panel Lab Results  Component Value Date/Time   TRIG 31 09/08/2018 03:56 PM      Risk Assessment/Calculations:    CHA2DS2-VASc Score = 5  This indicates a 7.2% annual risk of stroke. The patient's score is based upon: CHF History: Yes HTN History: Yes Diabetes History: No Stroke History: No Vascular Disease History: Yes Age Score: 2 Gender Score: 0    Physical Exam:  VS:  BP (!) 142/82   Pulse 61   Ht 5\' 11"  (1.803 m)   Wt 180 lb 6.4 oz (81.8 kg)   SpO2 94%   BMI 25.16 kg/m     Wt Readings from Last 3 Encounters:  11/26/20 180 lb 6.4 oz (81.8 kg)  06/16/20 175 lb 9.6 oz (79.7 kg)  01/09/20 169 lb (76.7 kg)     Constitutional:      Appearance: Healthy appearance. Not in distress.  Neck:     Thyroid: No thyromegaly.     Vascular: JVD elevated.  Pulmonary:     Effort: Pulmonary effort is normal.     Breath sounds: No wheezing. No rales.  Cardiovascular:     Normal rate. Irregularly irregular rhythm. Normal S1. Normal S2.      Murmurs: There is a grade 3/6 holosystolic murmur at the LLSB.     Comments: + pre-sacral edema Edema:    Peripheral edema present.    Thigh: bilateral trace edema of the thigh.    Pretibial: bilateral 2+ edema of the pretibial area.    Ankle: bilateral 2+ edema of the ankle. Abdominal:     General: There is distension.  Skin:    General: Skin is warm and dry.  Neurological:     General: No focal deficit present.     Mental Status: Alert and oriented to person, place and time.     Cranial Nerves: Cranial nerves are intact.         ASSESSMENT & PLAN:    1. Acute heart failure with preserved ejection fraction (HCC) Echocardiogram  in 10/2018 demonstrated normal LV function and moderate LVH.  He presents today with evidence of volume excess on exam.  His weight is up at least 5 pounds since January.  He is NYHA IIb.  He really has more clinical signs of right-sided heart failure than left.  His lungs are clear.  -DC HCTZ  -Start furosemide 40 mg twice daily x3 days, then 40 mg daily  -Increase K+ to 20 mEq twice daily x3 days, then 20 mEq daily  -BMET, CBC, BNP, TSH today  -Repeat BMET 1 week  -Follow-up echocardiogram  -Follow-up office visit in 2 weeks  2. Nonrheumatic mitral valve regurgitation 3. Nonrheumatic tricuspid valve regurgitation He had moderate MR and moderate TR by echocardiogram in June 2020.  Obtain follow-up echocardiogram as noted above.  Question if he has significant RV dysfunction given prior history of tricuspid regurgitation and now significant edema up his legs.  Adjust diuretic therapy as noted above.  He will have close follow-up in 2 weeks.  4. Permanent atrial fibrillation ([place]) Rate seem to be well controlled.  Continue current dose of Apixaban.  5. Other iron deficiency anemia As noted, he had an iron infusion today.  Recent hemoglobin dropped from 14 to 10.3.  Given his evidence of volume excess, I will recheck a CBC today to make sure his hemoglobin has not dropped significantly since then.  6. Essential hypertension Blood pressure somewhat above target.  Adjust medications as noted.  Continue to monitor.  Continue current dose of losartan, metoprolol succinate.     Dispo:  Return in 12 days (on 12/08/2020) for w/ Dr. Acie Holder.   Medication Adjustments/Labs and Tests Ordered: Current medicines are reviewed at length with the patient today.  Concerns regarding medicines are outlined above.  Tests Ordered: Orders Placed This Encounter  Procedures   Basic Metabolic Panel (BMET)   Basic Metabolic Panel (BMET)  Pro b natriuretic peptide   CBC   TSH   ECHOCARDIOGRAM COMPLETE    Medication Changes: Meds ordered this encounter  Medications   furosemide (LASIX) 40 MG tablet    Sig: Take one tablet by mouth ( 40 mg) twice daily X 3 days than take one tablet by mouth (40 mg) daily.    Dispense:  33 tablet    Refill:  11   potassium chloride 20 MEQ TBCR    Sig: Take one tablet ( 20 meq ) by mouth twice daily X 3 days than one tablet by mouth ( 20 meq) daily.    Dispense:  90 tablet    Refill:  3    Signed, Richardson Dopp, PA-C  11/26/2020 4:52 PM    [facility] [address], [place], [place]  [address] Phone: [phone]; Fax: [phone]

## 2020-11-26 ENCOUNTER — Other Ambulatory Visit (HOSPITAL_COMMUNITY): Payer: Self-pay

## 2020-11-26 ENCOUNTER — Encounter: Payer: Self-pay | Admitting: Physician Assistant

## 2020-11-26 ENCOUNTER — Inpatient Hospital Stay: Payer: Medicare PPO | Attending: Oncology

## 2020-11-26 ENCOUNTER — Other Ambulatory Visit: Payer: Self-pay

## 2020-11-26 ENCOUNTER — Ambulatory Visit: Payer: Medicare PPO | Admitting: Physician Assistant

## 2020-11-26 ENCOUNTER — Ambulatory Visit: Payer: Medicare PPO

## 2020-11-26 VITALS — BP 142/82 | HR 61 | Ht 71.0 in | Wt 180.4 lb

## 2020-11-26 VITALS — BP 141/95 | HR 61 | Temp 97.8°F | Resp 18

## 2020-11-26 DIAGNOSIS — I361 Nonrheumatic tricuspid (valve) insufficiency: Secondary | ICD-10-CM

## 2020-11-26 DIAGNOSIS — I4821 Permanent atrial fibrillation: Secondary | ICD-10-CM | POA: Diagnosis not present

## 2020-11-26 DIAGNOSIS — I5031 Acute diastolic (congestive) heart failure: Secondary | ICD-10-CM | POA: Diagnosis not present

## 2020-11-26 DIAGNOSIS — D649 Anemia, unspecified: Secondary | ICD-10-CM

## 2020-11-26 DIAGNOSIS — I10 Essential (primary) hypertension: Secondary | ICD-10-CM

## 2020-11-26 DIAGNOSIS — D508 Other iron deficiency anemias: Secondary | ICD-10-CM

## 2020-11-26 DIAGNOSIS — I340 Nonrheumatic mitral (valve) insufficiency: Secondary | ICD-10-CM | POA: Diagnosis not present

## 2020-11-26 MED ORDER — SODIUM CHLORIDE 0.9 % IV SOLN
400.0000 mg | Freq: Once | INTRAVENOUS | Status: AC
  Administered 2020-11-26: 400 mg via INTRAVENOUS
  Filled 2020-11-26: qty 20

## 2020-11-26 MED ORDER — SODIUM CHLORIDE 0.9 % IV SOLN
INTRAVENOUS | Status: DC
  Filled 2020-11-26: qty 250

## 2020-11-26 MED ORDER — FUROSEMIDE 40 MG PO TABS
ORAL_TABLET | ORAL | 11 refills | Status: DC
  Filled 2020-11-26: qty 33, 30d supply, fill #0

## 2020-11-26 MED ORDER — POTASSIUM CHLORIDE ER 20 MEQ PO TBCR: EXTENDED_RELEASE_TABLET | ORAL | 3 refills | Status: DC

## 2020-11-26 MED FILL — Omeprazole Cap Delayed Release 20 MG: ORAL | 90 days supply | Qty: 90 | Fill #0 | Status: AC

## 2020-11-26 NOTE — Patient Instructions (Addendum)

## 2020-11-26 NOTE — Patient Instructions (Signed)
Medication Instructions:   DISCONTINUE  HCTZ  START Lasix one tablet by mouth ( 40 mg) twice daily X 3 days than take one tablet by mouth ( 40 mg) daily.   INCREASE Potassium two tablets by mouth ( 20 meq) twice daily X 3 days than take two tablets by mouth (20 meq) daily.   *If you need a refill on your cardiac medications before your next appointment, please call your pharmacy*   Lab Work:  TODAY!!!!!!!!  BMET/PRO BNP/CBC/TSH  Your physician recommends that you return for lab work ON Wednesday, July 20 between 7:30 - 4:30.   If you have labs (blood work) drawn today and your tests are completely normal, you will receive your results only by: [place] (if you have MyChart) OR A paper copy in the mail If you have any lab test that is abnormal or we need to change your treatment, we will call you to review the results.   Testing/Procedures: Your physician has requested that you have an echocardiogram. Friday, July 29 @ 3:00 pm Echocardiography is a painless test that uses sound waves to create images of your heart. It provides your doctor with information about the size and shape of your heart and how well your heart's chambers and valves are working. This procedure takes approximately one hour. There are no restrictions for this procedure.    Follow-Up: At [facility], you and your health needs are our priority.  As part of our continuing mission to provide you with exceptional heart care, we have created designated Provider Care Teams.  These Care Teams include your primary Cardiologist (physician) and Advanced Practice Providers (APPs -  Physician Assistants and Nurse Practitioners) who all work together to provide you with the care you need, when you need it.  We recommend signing up for the patient portal called "MyChart".  Sign up information is provided on this After Visit Summary.  MyChart is used to connect with patients for Virtual Visits (Telemedicine).  Patients  are able to view lab/test results, encounter notes, upcoming appointments, etc.  Non-urgent messages can be sent to your provider as well.   To learn more about what you can do with MyChart, go to [email or web address].    Your next appointment:    12 day(s) on July 25 @ 8:20 with Dr.Nahser.   The format for your next appointment:   In Person  Provider:   Mertie Moores, MD   Other Instructions Low-Sodium Eating Plan Sodium, which is an element that makes up salt, helps you maintain a healthy balance of fluids in your body. Too much sodium can increase your bloodpressure and cause fluid and waste to be held in your body. Your health care provider or dietitian may recommend following this plan if you have high blood pressure (hypertension), kidney disease, liver disease, or heart failure. Eating less sodium can help lower your blood pressure, reduce swelling, and protect your heart, liver, andkidneys. What are tips for following this plan? Reading food labels The Nutrition Facts label lists the amount of sodium in one serving of the food. If you eat more than one serving, you must multiply the listed amount of sodium by the number of servings. Choose foods with less than 140 mg of sodium per serving. Avoid foods with 300 mg of sodium or more per serving. Shopping  Look for lower-sodium products, often labeled as "low-sodium" or "no salt added." Always check the sodium content, even if foods are labeled as "unsalted" or "no salt  added." Buy fresh foods. Avoid canned foods and pre-made or frozen meals. Avoid canned, cured, or processed meats. Buy breads that have less than 80 mg of sodium per slice.  Cooking  Eat more home-cooked food and less restaurant, buffet, and fast food. Avoid adding salt when cooking. Use salt-free seasonings or herbs instead of table salt or sea salt. Check with your health care provider or pharmacist before using salt substitutes. Cook with plant-based  oils, such as canola, sunflower, or olive oil.  Meal planning When eating at a restaurant, ask that your food be prepared with less salt or no salt, if possible. Avoid dishes labeled as brined, pickled, cured, smoked, or made with soy sauce, miso, or teriyaki sauce. Avoid foods that contain MSG (monosodium glutamate). MSG is sometimes added to [place] food, bouillon, and some canned foods. Make meals that can be grilled, baked, poached, roasted, or steamed. These are generally made with less sodium. General information Most people on this plan should limit their sodium intake to 1,500-2,000 mg (milligrams) of sodium each day. What foods should I eat? Fruits Fresh, frozen, or canned fruit. Fruit juice. Vegetables Fresh or frozen vegetables. "No salt added" canned vegetables. "No salt added"tomato sauce and paste. Low-sodium or reduced-sodium tomato and vegetable juice. Grains Low-sodium cereals, including oats, puffed wheat and rice, and shredded wheat. Low-sodium crackers. Unsalted rice. Unsalted pasta. Low-sodium bread.Whole-grain breads and whole-grain pasta. Meats and other proteins Fresh or frozen (no salt added) meat, poultry, seafood, and fish. Low-sodium canned tuna and salmon. Unsalted nuts. Dried peas, beans, and lentils withoutadded salt. Unsalted canned beans. Eggs. Unsalted nut butters. Dairy Milk. Soy milk. Cheese that is naturally low in sodium, such as ricotta cheese, fresh mozzarella, or Swiss cheese. Low-sodium or reduced-sodium cheese. Creamcheese. Yogurt. Seasonings and condiments Fresh and dried herbs and spices. Salt-free seasonings. Low-sodium mustard and ketchup. Sodium-free salad dressing. Sodium-free light mayonnaise. Fresh orrefrigerated horseradish. Lemon juice. Vinegar. Other foods Homemade, reduced-sodium, or low-sodium soups. Unsalted popcorn and pretzels.Low-salt or salt-free chips. The items listed above may not be a complete list of foods and beverages you can  eat. Contact a dietitian for more information. What foods should I avoid? Vegetables Sauerkraut, pickled vegetables, and relishes. Olives. [place] fries. Onion rings. Regular canned vegetables (not low-sodium or reduced-sodium). Regular canned tomato sauce and paste (not low-sodium or reduced-sodium). Regular tomato and vegetable juice (not low-sodium or reduced-sodium). Frozenvegetables in sauces. Grains Instant hot cereals. Bread stuffing, pancake, and biscuit mixes. Croutons. Seasoned rice or pasta mixes. Noodle soup cups. Boxed or frozen macaroni andcheese. Regular salted crackers. Self-rising flour. Meats and other proteins Meat or fish that is salted, canned, smoked, spiced, or pickled. Precooked or cured meat, such as sausages or meat loaves. Berniece Salines. Ham. Pepperoni. Hot dogs. Corned beef. Chipped beef. Salt pork. Jerky. Pickled herring. Anchovies andsardines. Regular canned tuna. Salted nuts. Dairy Processed cheese and cheese spreads. Hard cheeses. Cheese curds. Blue cheese.Feta cheese. String cheese. Regular cottage cheese. Buttermilk. Canned milk. Fats and oils Salted butter. Regular margarine. Ghee. Bacon fat. Seasonings and condiments Onion salt, garlic salt, seasoned salt, table salt, and sea salt. Canned and packaged gravies. Worcestershire sauce. Tartar sauce. Barbecue sauce. Teriyaki sauce. Soy sauce, including reduced-sodium. Steak sauce. Fish sauce. Oyster sauce. Cocktail sauce. Horseradish that you find on the shelf. Regular ketchup and mustard. Meat flavorings and tenderizers. Bouillon cubes. Hot sauce. Pre-made or packaged marinades. Pre-made or packaged taco seasonings. Relishes.Regular salad dressings. Salsa. Other foods Salted popcorn and pretzels. Corn chips and puffs. Potato  and tortilla chips.Canned or dried soups. Pizza. Frozen entrees and pot pies. The items listed above may not be a complete list of foods and beverages you should avoid. Contact a dietitian for more  information. Summary Eating less sodium can help lower your blood pressure, reduce swelling, and protect your heart, liver, and kidneys. Most people on this plan should limit their sodium intake to 1,500-2,000 mg (milligrams) of sodium each day. Canned, boxed, and frozen foods are high in sodium. Restaurant foods, fast foods, and pizza are also very high in sodium. You also get sodium by adding salt to food. Try to cook at home, eat more fresh fruits and vegetables, and eat less fast food and canned, processed, or prepared foods. This information is not intended to replace advice given to you by your health care provider. Make sure you discuss any questions you have with your healthcare provider. Document Revised: 06/08/2019 Document Reviewed: 04/04/2019 Elsevier Patient Education  2022 [employer].

## 2020-11-27 LAB — CBC
Hematocrit: 31.3 % — ABNORMAL LOW (ref 37.5–51.0)
Hemoglobin: 10 g/dL — ABNORMAL LOW (ref 13.0–17.7)
MCH: 26.6 pg (ref 26.6–33.0)
MCHC: 31.9 g/dL (ref 31.5–35.7)
MCV: 83 fL (ref 79–97)
Platelets: 159 10*3/uL (ref 150–450)
RBC: 3.76 x10E6/uL — ABNORMAL LOW (ref 4.14–5.80)
RDW: 15.7 % — ABNORMAL HIGH (ref 11.6–15.4)
WBC: 3.2 10*3/uL — ABNORMAL LOW (ref 3.4–10.8)

## 2020-11-27 LAB — BASIC METABOLIC PANEL
BUN/Creatinine Ratio: 16 (ref 10–24)
BUN: 18 mg/dL (ref 8–27)
CO2: 23 mmol/L (ref 20–29)
Calcium: 9.1 mg/dL (ref 8.6–10.2)
Chloride: 93 mmol/L — ABNORMAL LOW (ref 96–106)
Creatinine, Ser: 1.11 mg/dL (ref 0.76–1.27)
Glucose: 112 mg/dL — ABNORMAL HIGH (ref 65–99)
Potassium: 4 mmol/L (ref 3.5–5.2)
Sodium: 131 mmol/L — ABNORMAL LOW (ref 134–144)
eGFR: 65 mL/min/{1.73_m2} (ref >59)

## 2020-11-27 LAB — PRO B NATRIURETIC PEPTIDE: NT-Pro BNP: 1404 pg/mL — ABNORMAL HIGH (ref 0–486)

## 2020-11-27 LAB — TSH: TSH: 2.95 u[IU]/mL (ref 0.450–4.500)

## 2020-11-28 ENCOUNTER — Other Ambulatory Visit (HOSPITAL_COMMUNITY): Payer: Self-pay

## 2020-12-03 ENCOUNTER — Ambulatory Visit: Payer: Medicare PPO

## 2020-12-03 ENCOUNTER — Other Ambulatory Visit: Payer: Self-pay

## 2020-12-03 ENCOUNTER — Inpatient Hospital Stay: Payer: Medicare PPO

## 2020-12-03 ENCOUNTER — Other Ambulatory Visit: Payer: Medicare PPO

## 2020-12-03 VITALS — BP 118/75 | HR 55 | Temp 97.7°F | Resp 18

## 2020-12-03 DIAGNOSIS — I5031 Acute diastolic (congestive) heart failure: Secondary | ICD-10-CM | POA: Diagnosis not present

## 2020-12-03 DIAGNOSIS — I4821 Permanent atrial fibrillation: Secondary | ICD-10-CM

## 2020-12-03 DIAGNOSIS — I10 Essential (primary) hypertension: Secondary | ICD-10-CM | POA: Diagnosis not present

## 2020-12-03 DIAGNOSIS — D508 Other iron deficiency anemias: Secondary | ICD-10-CM

## 2020-12-03 DIAGNOSIS — I340 Nonrheumatic mitral (valve) insufficiency: Secondary | ICD-10-CM | POA: Diagnosis not present

## 2020-12-03 DIAGNOSIS — D649 Anemia, unspecified: Secondary | ICD-10-CM

## 2020-12-03 DIAGNOSIS — I361 Nonrheumatic tricuspid (valve) insufficiency: Secondary | ICD-10-CM | POA: Diagnosis not present

## 2020-12-03 LAB — BASIC METABOLIC PANEL
BUN/Creatinine Ratio: 17 (ref 10–24)
BUN: 20 mg/dL (ref 8–27)
CO2: 24 mmol/L (ref 20–29)
Calcium: 9.1 mg/dL (ref 8.6–10.2)
Chloride: 94 mmol/L — ABNORMAL LOW (ref 96–106)
Creatinine, Ser: 1.15 mg/dL (ref 0.76–1.27)
Glucose: 102 mg/dL — ABNORMAL HIGH (ref 65–99)
Potassium: 4.6 mmol/L (ref 3.5–5.2)
Sodium: 132 mmol/L — ABNORMAL LOW (ref 134–144)
eGFR: 62 mL/min/{1.73_m2} (ref >59)

## 2020-12-03 MED ORDER — SODIUM CHLORIDE 0.9 % IV SOLN
400.0000 mg | Freq: Once | INTRAVENOUS | Status: AC
  Administered 2020-12-03: 400 mg via INTRAVENOUS
  Filled 2020-12-03: qty 20

## 2020-12-03 MED ORDER — SODIUM CHLORIDE 0.9 % IV SOLN
INTRAVENOUS | Status: DC
  Filled 2020-12-03: qty 250

## 2020-12-03 NOTE — Progress Notes (Signed)
Venofer plan: Venofer 400mg  x 2 doses. Third infusion not needed.  Raul Del [place], , BCPS, BCOP 12/03/2020 3:37 PM

## 2020-12-03 NOTE — Patient Instructions (Signed)

## 2020-12-07 ENCOUNTER — Encounter: Payer: Self-pay | Admitting: Cardiovascular Disease
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
## 2020-12-08 ENCOUNTER — Encounter: Payer: Self-pay | Admitting: Cardiovascular Disease

## 2020-12-08 ENCOUNTER — Ambulatory Visit: Payer: Medicare PPO | Admitting: Cardiovascular Disease

## 2020-12-08 ENCOUNTER — Other Ambulatory Visit: Payer: Self-pay

## 2020-12-08 VITALS — BP 140/72 | HR 79 | Ht 71.0 in | Wt 163.6 lb

## 2020-12-08 DIAGNOSIS — I4821 Permanent atrial fibrillation: Secondary | ICD-10-CM | POA: Diagnosis not present

## 2020-12-08 DIAGNOSIS — I482 Chronic atrial fibrillation, unspecified: Secondary | ICD-10-CM | POA: Diagnosis not present

## 2020-12-08 DIAGNOSIS — I10 Essential (primary) hypertension: Secondary | ICD-10-CM | POA: Diagnosis not present

## 2020-12-08 DIAGNOSIS — I5031 Acute diastolic (congestive) heart failure: Secondary | ICD-10-CM

## 2020-12-08 MED ORDER — FUROSEMIDE 40 MG PO TABS
40.0000 mg | ORAL_TABLET | ORAL | 3 refills | Status: DC
  Filled 2020-12-08: qty 90, fill #0
  Filled 2020-12-09: qty 13, 30d supply, fill #0
  Filled 2020-12-09: qty 90, 210d supply, fill #0
  Filled 2021-01-06: qty 38, 90d supply, fill #0

## 2020-12-08 MED ORDER — POTASSIUM CHLORIDE CRYS ER 20 MEQ PO TBCR
20.0000 meq | EXTENDED_RELEASE_TABLET | ORAL | 3 refills | Status: DC
  Filled 2020-12-08: qty 90, fill #0
  Filled 2020-12-09: qty 7, 16d supply, fill #0
  Filled 2020-12-09: qty 13, 30d supply, fill #0
  Filled 2020-12-09: qty 6, 14d supply, fill #0
  Filled 2021-01-06: qty 39, 90d supply, fill #1

## 2020-12-08 NOTE — Patient Instructions (Signed)
Medication Instructions:  Your physician has recommended you make the following change in your medication:  1) DECREASE Lasix to 40 mg every Monday, Wednesday and Friday  2) DECREASE Kdur to 20 meq every Monday, Wednesday and Friday  *If you need a refill on your cardiac medications before your next appointment, please call your pharmacy*   Lab Work: BMET in 3-4 weeks  If you have labs (blood work) drawn today and your tests are completely normal, you will receive your results only by: [place] (if you have MyChart) OR A paper copy in the mail If you have any lab test that is abnormal or we need to change your treatment, we will call you to review the results.  Follow-Up: At [facility], you and your health needs are our priority.  As part of our continuing mission to provide you with exceptional heart care, we have created designated Provider Care Teams.  These Care Teams include your primary Cardiologist (physician) and Advanced Practice Providers (APPs -  Physician Assistants and Nurse Practitioners) who all work together to provide you with the care you need, when you need it.  Your next appointment:   3 month(s)  The format for your next appointment:   In Person  Provider:   You will see one of the following Advanced Practice Providers on your designated Care Team:   Richardson Dopp, PA-C [place], [place]

## 2020-12-09 ENCOUNTER — Encounter: Payer: Self-pay | Admitting: Oncology

## 2020-12-09 ENCOUNTER — Other Ambulatory Visit (HOSPITAL_COMMUNITY): Payer: Self-pay

## 2020-12-12 ENCOUNTER — Ambulatory Visit (HOSPITAL_COMMUNITY): Payer: Medicare PPO | Attending: Cardiovascular Disease

## 2020-12-12 ENCOUNTER — Other Ambulatory Visit: Payer: Self-pay

## 2020-12-12 DIAGNOSIS — I340 Nonrheumatic mitral (valve) insufficiency: Secondary | ICD-10-CM | POA: Diagnosis not present

## 2020-12-12 DIAGNOSIS — I5031 Acute diastolic (congestive) heart failure: Secondary | ICD-10-CM

## 2020-12-12 DIAGNOSIS — D508 Other iron deficiency anemias: Secondary | ICD-10-CM | POA: Diagnosis not present

## 2020-12-12 DIAGNOSIS — I4821 Permanent atrial fibrillation: Secondary | ICD-10-CM | POA: Diagnosis not present

## 2020-12-12 DIAGNOSIS — I10 Essential (primary) hypertension: Secondary | ICD-10-CM

## 2020-12-12 DIAGNOSIS — I361 Nonrheumatic tricuspid (valve) insufficiency: Secondary | ICD-10-CM

## 2020-12-12 LAB — ECHOCARDIOGRAM COMPLETE
Calc EF: 69.4 %
S' Lateral: 3.1 cm
Single Plane A2C EF: 71.4 %
Single Plane A4C EF: 68.1 %

## 2020-12-13 ENCOUNTER — Encounter: Payer: Self-pay | Admitting: Physician Assistant

## 2020-12-13 DIAGNOSIS — I272 Pulmonary hypertension, unspecified: Secondary | ICD-10-CM | POA: Insufficient documentation

## 2020-12-15 DIAGNOSIS — I272 Pulmonary hypertension, unspecified: Secondary | ICD-10-CM

## 2020-12-16 ENCOUNTER — Other Ambulatory Visit: Payer: Self-pay | Admitting: *Deleted

## 2020-12-16 DIAGNOSIS — I272 Pulmonary hypertension, unspecified: Secondary | ICD-10-CM

## 2020-12-24 NOTE — Telephone Encounter (Signed)
Yes, I think he should be seen sooner. Also, his VQ scan is not until the end of this month.  That should also be done sooner.  Please see if we can get his VQ scan done this week or next.  PFTs are schedule for next week.  See if [facility] Clinic can see him after his VQ scan and PFTs (in the next 2 weeks).  Richardson Dopp, PA-C    12/24/2020 1:26 PM

## 2020-12-25 ENCOUNTER — Encounter (HOSPITAL_COMMUNITY): Payer: Self-pay | Admitting: *Deleted

## 2020-12-29 ENCOUNTER — Other Ambulatory Visit: Payer: Medicare PPO | Admitting: *Deleted

## 2020-12-29 ENCOUNTER — Other Ambulatory Visit: Payer: Self-pay

## 2020-12-29 ENCOUNTER — Ambulatory Visit (INDEPENDENT_AMBULATORY_CARE_PROVIDER_SITE_OTHER): Payer: Medicare PPO | Admitting: Internal Medicine

## 2020-12-29 DIAGNOSIS — I4821 Permanent atrial fibrillation: Secondary | ICD-10-CM

## 2020-12-29 DIAGNOSIS — I272 Pulmonary hypertension, unspecified: Secondary | ICD-10-CM | POA: Diagnosis not present

## 2020-12-29 DIAGNOSIS — I5031 Acute diastolic (congestive) heart failure: Secondary | ICD-10-CM | POA: Diagnosis not present

## 2020-12-29 LAB — PULMONARY FUNCTION TEST
DL/VA % pred: 108 %
DL/VA: 4.14 ml/min/mmHg/L
DLCO cor % pred: 100 %
DLCO cor: 23.76 ml/min/mmHg
DLCO unc % pred: 84 %
DLCO unc: 19.97 ml/min/mmHg
FEF 25-75 Post: 1.82 L/sec
FEF 25-75 Pre: 1 L/sec
FEF2575-%Change-Post: 81 %
FEF2575-%Pred-Post: 108 %
FEF2575-%Pred-Pre: 59 %
FEV1-%Change-Post: 20 %
FEV1-%Pred-Post: 94 %
FEV1-%Pred-Pre: 78 %
FEV1-Post: 2.47 L
FEV1-Pre: 2.05 L
FEV1FVC-%Change-Post: 8 %
FEV1FVC-%Pred-Pre: 88 %
FEV6-%Change-Post: 11 %
FEV6-%Pred-Post: 103 %
FEV6-%Pred-Pre: 92 %
FEV6-Post: 3.6 L
FEV6-Pre: 3.23 L
FEV6FVC-%Change-Post: 0 %
FEV6FVC-%Pred-Post: 106 %
FEV6FVC-%Pred-Pre: 106 %
FVC-%Change-Post: 10 %
FVC-%Pred-Post: 96 %
FVC-%Pred-Pre: 87 %
FVC-Post: 3.64 L
FVC-Pre: 3.29 L
Post FEV1/FVC ratio: 68 %
Post FEV6/FVC ratio: 99 %
Pre FEV1/FVC ratio: 62 %
Pre FEV6/FVC Ratio: 98 %
RV % pred: 112 %
RV: 3.12 L
TLC % pred: 91 %
TLC: 6.48 L

## 2020-12-29 LAB — BASIC METABOLIC PANEL
BUN/Creatinine Ratio: 14 (ref 10–24)
BUN: 16 mg/dL (ref 8–27)
CO2: 24 mmol/L (ref 20–29)
Calcium: 9.4 mg/dL (ref 8.6–10.2)
Chloride: 100 mmol/L (ref 96–106)
Creatinine, Ser: 1.11 mg/dL (ref 0.76–1.27)
Glucose: 61 mg/dL — ABNORMAL LOW (ref 65–99)
Potassium: 4.2 mmol/L (ref 3.5–5.2)
Sodium: 136 mmol/L (ref 134–144)
eGFR: 65 mL/min/{1.73_m2} (ref >59)

## 2020-12-29 NOTE — Progress Notes (Signed)
PFT done today. 

## 2020-12-31 ENCOUNTER — Encounter (HOSPITAL_COMMUNITY)
Admission: RE | Admit: 2020-12-31 | Discharge: 2020-12-31 | Disposition: A | Discharge status: Home / Self Care | Payer: Medicare PPO | Source: Ambulatory Visit | Attending: Physician Assistant | Admitting: Physician Assistant

## 2020-12-31 ENCOUNTER — Other Ambulatory Visit: Payer: Self-pay | Admitting: Physician Assistant

## 2020-12-31 ENCOUNTER — Other Ambulatory Visit: Payer: Self-pay

## 2020-12-31 ENCOUNTER — Other Ambulatory Visit (HOSPITAL_BASED_OUTPATIENT_CLINIC_OR_DEPARTMENT_OTHER): Payer: Self-pay | Admitting: *Deleted

## 2020-12-31 ENCOUNTER — Ambulatory Visit (HOSPITAL_COMMUNITY)
Admission: RE | Admit: 2020-12-31 | Discharge: 2020-12-31 | Disposition: A | Discharge status: Home / Self Care | Payer: Medicare PPO | Source: Ambulatory Visit | Attending: Physician Assistant | Admitting: Physician Assistant

## 2020-12-31 DIAGNOSIS — I272 Pulmonary hypertension, unspecified: Secondary | ICD-10-CM | POA: Insufficient documentation

## 2020-12-31 DIAGNOSIS — Z8679 Personal history of other diseases of the circulatory system: Secondary | ICD-10-CM | POA: Diagnosis not present

## 2020-12-31 DIAGNOSIS — J9690 Respiratory failure, unspecified, unspecified whether with hypoxia or hypercapnia: Secondary | ICD-10-CM | POA: Diagnosis not present

## 2020-12-31 DIAGNOSIS — I517 Cardiomegaly: Secondary | ICD-10-CM | POA: Diagnosis not present

## 2020-12-31 DIAGNOSIS — I10 Essential (primary) hypertension: Secondary | ICD-10-CM | POA: Diagnosis not present

## 2020-12-31 DIAGNOSIS — J45909 Unspecified asthma, uncomplicated: Secondary | ICD-10-CM

## 2020-12-31 DIAGNOSIS — R0602 Shortness of breath: Secondary | ICD-10-CM | POA: Diagnosis not present

## 2020-12-31 MED ORDER — TECHNETIUM TO 99M ALBUMIN AGGREGATED
4.2000 | Freq: Once | INTRAVENOUS | Status: AC | PRN
  Administered 2020-12-31: 4.2 via INTRAVENOUS

## 2021-01-06 ENCOUNTER — Other Ambulatory Visit (HOSPITAL_COMMUNITY): Payer: Self-pay

## 2021-01-07 ENCOUNTER — Encounter: Payer: Self-pay | Admitting: Oncology

## 2021-01-07 DIAGNOSIS — D49511 Neoplasm of unspecified behavior of right kidney: Secondary | ICD-10-CM | POA: Diagnosis not present

## 2021-01-07 DIAGNOSIS — N5201 Erectile dysfunction due to arterial insufficiency: Secondary | ICD-10-CM | POA: Diagnosis not present

## 2021-01-07 DIAGNOSIS — Z905 Acquired absence of kidney: Secondary | ICD-10-CM | POA: Diagnosis not present

## 2021-01-08 ENCOUNTER — Other Ambulatory Visit: Payer: Self-pay

## 2021-01-08 ENCOUNTER — Inpatient Hospital Stay: Payer: Medicare PPO | Attending: Oncology | Admitting: Oncology

## 2021-01-08 ENCOUNTER — Inpatient Hospital Stay: Payer: Medicare PPO

## 2021-01-08 VITALS — BP 126/72 | HR 88 | Temp 97.7°F | Resp 18 | Ht 71.0 in | Wt 167.0 lb

## 2021-01-08 DIAGNOSIS — D509 Iron deficiency anemia, unspecified: Secondary | ICD-10-CM | POA: Insufficient documentation

## 2021-01-08 DIAGNOSIS — D61818 Other pancytopenia: Secondary | ICD-10-CM

## 2021-01-08 DIAGNOSIS — D508 Other iron deficiency anemias: Secondary | ICD-10-CM

## 2021-01-08 DIAGNOSIS — D500 Iron deficiency anemia secondary to blood loss (chronic): Secondary | ICD-10-CM

## 2021-01-08 DIAGNOSIS — D649 Anemia, unspecified: Secondary | ICD-10-CM

## 2021-01-08 LAB — CBC WITH DIFFERENTIAL/PLATELET
Abs Immature Granulocytes: 0.01 10*3/uL (ref 0.00–0.07)
Basophils Absolute: 0 10*3/uL (ref 0.0–0.1)
Basophils Relative: 1 %
Eosinophils Absolute: 0.2 10*3/uL (ref 0.0–0.5)
Eosinophils Relative: 6 %
HCT: 39.7 % (ref 39.0–52.0)
Hemoglobin: 12.8 g/dL — ABNORMAL LOW (ref 13.0–17.0)
Immature Granulocytes: 0 %
Lymphocytes Relative: 25 %
Lymphs Abs: 0.7 10*3/uL (ref 0.7–4.0)
MCH: 29 pg (ref 26.0–34.0)
MCHC: 32.2 g/dL (ref 30.0–36.0)
MCV: 89.8 fL (ref 80.0–100.0)
Monocytes Absolute: 0.4 10*3/uL (ref 0.1–1.0)
Monocytes Relative: 15 %
Neutro Abs: 1.5 10*3/uL — ABNORMAL LOW (ref 1.7–7.7)
Neutrophils Relative %: 53 %
Platelets: 126 10*3/uL — ABNORMAL LOW (ref 150–400)
RBC: 4.42 MIL/uL (ref 4.22–5.81)
RDW: 19.9 % — ABNORMAL HIGH (ref 11.5–15.5)
WBC: 2.9 10*3/uL — ABNORMAL LOW (ref 4.0–10.5)
nRBC: 0 % (ref 0.0–0.2)

## 2021-01-08 LAB — RETICULOCYTES
Immature Retic Fract: 9.6 % (ref 2.3–15.9)
RBC.: 4.41 MIL/uL (ref 4.22–5.81)
Retic Count, Absolute: 53.8 10*3/uL (ref 19.0–186.0)
Retic Ct Pct: 1.2 % (ref 0.4–3.1)

## 2021-01-08 NOTE — Progress Notes (Signed)
[place]  [phone] Fax:(336) [phone]     ID: John Holder DOB: 1932/01/19  MR#: [id number]  [id number]  Patient Care Team: Burnard Bunting, MD as PCP - General (Internal Medicine) Nahser, Wonda Cheng, MD as PCP - Cardiology (Cardiology) Kordel Leavy, Virgie Dad, MD as Consulting Physician (Hematology and Oncology) Irine Seal, MD as Consulting Physician (Urology) Gatha Mayer, MD as Consulting Physician (Gastroenterology) Chauncey Cruel, MD OTHER MD:  CHIEF COMPLAINT: Symptomatic iron deficinecy anemia  CURRENT TREATMENT: Ferrous gluconate; Venofer as needed   INTERVAL HISTORY: John [name] returns today for follow up of his iron deficiency anemia and pancytopenia.    He is taking oral iron daily with no symptoms.  He also received Venofer on 07 13 and 07 20.  Even though his hemoglobin was just over 10, and usually we think at that level and anemia is not symptomatic, nevertheless he was quite short of breath with moderate exertion.  After his 2 doses of Venofer in July his hemoglobin has risen and now he is considerably less short of breath although not near the level of activity that he would like to be in.  He has been found to have significant pulmonary hypertension.  Pulmonary emboli have been ruled out.  He has an appointment with Dr. Bjorn Loser tomorrow to discuss this further.  REVIEW OF SYSTEMS: John [name] remains very active and despite his shortness of breath he recently [place], which is about 2-1/2 miles with some moderate grades.  He had to stop rather frequently to catch his breath but then pushed on.  He had no chest pain or pressure.  He is still walking his dog regularly but is not riding his motorcycle at present.  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 He does have cardiac issues and brought this to the attention of his cardiologist Liam Rogers and his PCP Burnard Bunting. The patient was started on lasix empirically, w/o improvement.   Yesterday he presented to the ED with SOB symptoms and was found to have a Hb of 6.o with an MCV of 76.7. WBC was 2.6, platelets 156K. Note on 05/26/2018 Hb was 15.1 with a normal MCV.   Admission labs showed retics 40.5, normal B-12 and folate, normal CRP, normal LDH and negative DAT. Ferritin however was 7 and iron saturation 2%   He was transfused and admitted for further evaluation  The patient's subsequent history is as detailed below.   PAST MEDICAL HISTORY: Past Medical History:  Diagnosis Date   Blood transfusion without reported diagnosis    Cataract    removed in replace bilateral   GERD (gastroesophageal reflux disease)    Hypertension    Iron deficiency anemia    amdx 08/2018 >> Tx with PRBCs and Feraheme started; no GI source per workup; followed by Heme (Dr. Jana Hakim) and GI (Dr. Cristina Gong)    Left renal mass    s/p L nephrectomy   Mitral regurgitation    Echo 02/2016:  Mild conc LVH, EF 55-60, mild to mod MR, mod LAE, mod to severe RAE, mod TR, PASP 41 // Echo 10/2018: EF 60-65, mod conc LVH, severe BAE, trivial eff, mild MVP with  mod MR, mod to severe TR, asc aorta 36 (mild dilation)    Persistent Atrial Fibrillation    Pulmonary hypertension (HCC)    Echocardiogram 7/22: EF 60-65, no RWMA, normal RVSF, RVSP 70.7 (severe pulmonary hypertension), severe BAE, mod MR, severe TR, mild to mod AV sclerosis w/o AS    PAST SURGICAL HISTORY: Past Surgical History:  Procedure Laterality Date   CARDIOVERSION N/A 02/04/2016   Procedure: CARDIOVERSION;  Surgeon: Thayer Headings, MD;  Location: [facility];  Service: Cardiovascular;  Laterality: N/A;   COLONOSCOPY     COLONOSCOPY WITH PROPOFOL  N/A 09/10/2018   Procedure: COLONOSCOPY WITH PROPOFOL;  Surgeon: Ronald Lobo, MD;  Location: [facility];  Service: Endoscopy;  Laterality: N/A;   ESOPHAGOGASTRODUODENOSCOPY (EGD) WITH PROPOFOL N/A 09/10/2018   Procedure: ESOPHAGOGASTRODUODENOSCOPY (EGD) WITH PROPOFOL;  Surgeon: Ronald Lobo, MD;  Location: [facility];  Service: Endoscopy;  Laterality: N/A;   GIVENS CAPSULE STUDY N/A 09/10/2018   Procedure: GIVENS CAPSULE STUDY;  Surgeon: Ronald Lobo, MD;  Location: [facility];  Service: Endoscopy;  Laterality: N/A;    FAMILY HISTORY: Family History  Problem Relation Age of Onset   Breast cancer Mother    Hypertension Father    Diabetes Neg Hx    Stroke Neg Hx    CAD Neg Hx    Colon cancer Neg Hx    Colon polyps Neg Hx    Esophageal cancer Neg Hx    Rectal cancer Neg Hx    Stomach cancer Neg Hx      SOCIAL HISTORY: (updated 08/2018)  [name] "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 Carmie Kanner. Recently moved, with "lots of exercise carrying boxes."    ADVANCED DIRECTIVES: not in place; plans to name Ms. Aichele as his HCPOA with his son Masun Merson as secondary; Remo Lipps lives in [place] and works as a Theatre stage manager MAINTENANCE: Social History   Tobacco Use   Smoking status: Never   Smokeless tobacco: Never  Vaping Use   Vaping Use: Never used  Substance Use Topics   Alcohol use: Yes    Alcohol/week: 0.0 standard drinks    Comment: occasional   Drug use: No     Colonoscopy: [facility], "about 2013;" does not recall MD's name  Bone density:    No Known Allergies  Current Outpatient Medications  Medication Sig Dispense Refill   ALPRAZolam (XANAX) 0.25 MG tablet Take 0.25 mg by mouth as needed for anxiety.   1   apixaban (ELIQUIS) 5 MG TABS tablet TAKE 1 TABLET BY MOUTH TWICE DAILY 180 tablet 3   Apoaequorin (PREVAGEN EXTRA STRENGTH PO) Takes once daily     Barberry-Oreg Grape-Goldenseal (BERBERINE COMPLEX PO)  Take 600 mg by mouth daily. Takes 2 capsule once daily     Chromium Picolinate 400 MCG TABS Takes once tablet twice daily     ciclopirox (PENLAC) 8 % solution      Coenzyme Q10 (CO Q10) 200 MG CAPS Takes one capsule once daily     ferrous gluconate (FERGON) 324 MG tablet Take 324 mg by mouth daily with breakfast.     furosemide (LASIX) 40 MG tablet Take 1 tablet (40 mg total) by mouth 3 (three) times a week on Monday, Wednesday, and Friday 90 tablet 3   Garlic 123XX123 MG CAPS Takes one capsule twice daily (Patient not taking: Reported on 12/08/2020)     glucosamine-chondroitin 500-400 MG tablet Take 1 tablet by mouth 2 (two) times  daily.      KRILL OIL PO Take 1 capsule by mouth daily at 12 noon.     Linoleic Acid Conjugated 1000 MG CAPS Takes one capsule twice daily     losartan (COZAAR) 50 MG tablet TAKE 1 TABLET BY MOUTH EVERY DAY 90 tablet 1   Magnesium Glycinate 665 MG CAPS Takes one capsule twice daily     Melatonin 5 MG TABS Takes one tablet at bedtime     methocarbamol (ROBAXIN) 500 MG tablet TAKE 1-2 TABLETS BY MOUTH EVERY SIX TO EIGHT HOURS AS NEEDED FOR SPASMS/MUSCLE TENSION (Patient not taking: Reported on 12/08/2020) 60 tablet 2   metoprolol succinate (TOPROL-XL) 50 MG 24 hr tablet TAKE 1 TABLET (50 MG TOTAL) BY MOUTH 2 (TWO) TIMES DAILY. TAKE WITH OR IMMEDIATELY FOLLOWING A MEAL. 180 tablet 1   Multiple Vitamin (MULTIVITAMIN) capsule Take 1 capsule by mouth daily. Takes two tablets once daily     NON FORMULARY Take 1 capsule by mouth 2 (two) times daily. TRU vitamin     omeprazole (PRILOSEC) 20 MG capsule Take 20 mg by mouth daily.     OVER THE COUNTER MEDICATION Uses 3 different types of Herbal combinations for prostate, one for urinary care and one for liver care. It's 3 separate bottles     potassium chloride SA (KLOR-CON) 20 MEQ tablet Take 1 tablet (20 mEq total) by mouth 3 (three) times a week on Monday, Wednesday and Friday 90 tablet 3   tadalafil (CIALIS) 20 MG tablet TAKE ONE  TABLET BY MOUTH DAILY AS NEEDED 30 tablet 11   triamcinolone (KENALOG) 0.1 % APPLY 1 APPLICATION ON THE SKIN TO AFFECTED AREAS TWICE A DAY (Patient not taking: Reported on 12/08/2020) 80 g 0   No current facility-administered medications for this visit.    OBJECTIVE: White man in no acute distress  Vitals:   01/08/21 1541  BP: 126/72  Pulse: 88  Resp: 18  Temp: 97.7 F (36.5 C)  SpO2: 95%     Body mass index is 23.29 kg/m.   Wt Readings from Last 3 Encounters:  01/08/21 167 lb (75.8 kg)  12/08/20 163 lb 9.6 oz (74.2 kg)  11/26/20 180 lb 6.4 oz (81.8 kg)      ECOG FS:1 - Symptomatic but completely ambulatory  Sclerae unicteric, EOMs intact Wearing a mask No cervical or supraclavicular adenopathy Lungs no rales or rhonchi Heart regular rate and rhythm Abd soft, nontender, positive bowel sounds MSK no focal spinal tenderness, no upper extremity lymphedema Neuro: nonfocal, well oriented, positive affect  LAB RESULTS:  CMP     Component Value Date/Time   NA 136 12/29/2020 0934   K 4.2 12/29/2020 0934   CL 100 12/29/2020 0934   CO2 24 12/29/2020 0934   GLUCOSE 61 (L) 12/29/2020 0934   GLUCOSE 121 (H) 11/12/2020 1519   BUN 16 12/29/2020 0934   CREATININE 1.11 12/29/2020 0934   CREATININE 1.08 11/12/2020 1519   CREATININE 1.01 02/02/2016 1542   CALCIUM 9.4 12/29/2020 0934   PROT 6.5 11/12/2020 1519   ALBUMIN 3.9 11/12/2020 1519   AST 32 11/12/2020 1519   ALT 18 11/12/2020 1519   ALKPHOS 77 11/12/2020 1519   BILITOT 1.1 11/12/2020 1519   GFRNONAA >60 11/12/2020 1519   GFRAA 75 06/16/2020 1544    Lab Results  Component Value Date   TOTALPROTELP 5.2 (L) 09/09/2018   ALBUMINELP 3.4 09/09/2018   A1GS 0.2 09/09/2018   A2GS 0.5 09/09/2018   BETS 0.6 (  L) 09/09/2018   GAMS 0.5 09/09/2018   MSPIKE Not Observed 09/09/2018   SPEI Comment 09/09/2018    Lab Results  Component Value Date   WBC 2.9 (L) 01/08/2021   NEUTROABS 1.5 (L) 01/08/2021   HGB 12.8 (L)  01/08/2021   HCT 39.7 01/08/2021   MCV 89.8 01/08/2021   PLT 126 (L) 01/08/2021    No results found for: LABCA2  No components found for: [id number]  No results for input(s): INR in the last 168 hours.  No results found for: LABCA2  No results found for: [id number]  No results found for: [id number]  No results found for: [id number]  No results found for: CA2729  No components found for: HGQUANT  No results found for: CEA1 / No results found for: CEA1   No results found for: AFPTUMOR  No results found for: CHROMOGRNA  No results found for: KPAFRELGTCHN, LAMBDASER, KAPLAMBRATIO (kappa/lambda light chains)  No results found for: HGBA, HGBA2QUANT, HGBFQUANT, HGBSQUAN (Hemoglobinopathy evaluation)   Lab Results  Component Value Date   LDH 165 09/08/2018    Lab Results  Component Value Date   IRON 22 (L) 11/12/2020   TIBC 384 11/12/2020   IRONPCTSAT 6 (L) 11/12/2020   (Iron and TIBC)  Lab Results  Component Value Date   FERRITIN 25 11/12/2020    Urinalysis    Component Value Date/Time   COLORURINE YELLOW 09/09/2018 1050   APPEARANCEUR CLEAR 09/09/2018 1050   LABSPEC 1.036 (H) 09/09/2018 1050   PHURINE 5.0 09/09/2018 1050   GLUCOSEU NEGATIVE 09/09/2018 [place] 09/09/2018 [place] 09/09/2018 1050   KETONESUR NEGATIVE 09/09/2018 1050   PROTEINUR NEGATIVE 09/09/2018 1050   NITRITE NEGATIVE 09/09/2018 [place] 09/09/2018 1050     STUDIES: DG Chest 2 View  Result Date: 12/31/2020 CLINICAL DATA:  Shortness of breath.  Pulmonary hypertension. EXAM: CHEST - 2 VIEW COMPARISON:  Chest CT dated 09/08/2018. FINDINGS: No focal consolidation, pleural effusion, pneumothorax. Mild cardiomegaly. No acute osseous pathology. Osteopenia with degenerative changes of the spine and shoulders. IMPRESSION: No acute cardiopulmonary process. Mild cardiomegaly. Electronically Signed   By: Anner Crete M.D.   On: 12/31/2020 22:51    NM Pulmonary Perfusion  Result Date: 01/01/2021 CLINICAL DATA:  Respiratory failure and pulmonary hypertension EXAM: NUCLEAR MEDICINE PERFUSION LUNG SCAN TECHNIQUE: Perfusion images were obtained in multiple projections after intravenous injection of radiopharmaceutical. Ventilation scans intentionally deferred if perfusion scan and chest x-ray adequate for interpretation during COVID 19 epidemic. RADIOPHARMACEUTICALS:  4.2 mCi Tc-23mMAA IV COMPARISON:  Chest x-ray from earlier in the same day FINDINGS: Adequate uptake of radioactive tracer is noted throughout both lungs. No focal defect is identified to suggest pulmonary embolism. IMPRESSION: No evidence of pulmonary embolism. Electronically Signed   By: MInez CatalinaM.D.   On: 01/01/2021 00:31   ECHOCARDIOGRAM COMPLETE  Result Date: 12/12/2020    ECHOCARDIOGRAM REPORT   Patient Name:   JHEROLD SLATES Date of Exam: 12/12/2020 Medical Rec #:  [id number]    Height:       71.0 in Accession #:    [id number]   Weight:       163.6 lb Date of Birth:  9September 19, 1933    BSA:          1.936 m Patient Age:    89years      BP:           140/72 mmHg Patient  Gender: M             HR:           78 bpm. Exam Location:  [facility] Procedure: 2D Echo Indications:    Congestive Heart Failure I50.9  History:        Patient has prior history of Echocardiogram examinations, most                 recent 10/16/2018. Mitral Valve Disease and Tricuspid Valve                 Disease, Arrythmias:Atrial Fibrillation; Risk                 Factors:Hypertension.  Sonographer:    Mikki Santee RDCS Referring Phys: [id number] [address]olphin WEAVER IMPRESSIONS  1. Left ventricular ejection fraction, by estimation, is 60 to 65%. The left ventricle has normal function. The left ventricle has no regional wall motion abnormalities. Left ventricular diastolic parameters are indeterminate.  2. Right ventricular systolic function is normal. The right ventricular size is normal. There is severely elevated  pulmonary artery systolic pressure.  3. Left atrial size was severely dilated.  4. Right atrial size was severely dilated.  5. Thickend with prolapse of posterior segment Splay artifact overall liekly moderate MR . The mitral valve is normal in structure. Moderate mitral valve regurgitation. No evidence of mitral stenosis.  6. Tricuspid valve regurgitation is severe.  7. The aortic valve is tricuspid. There is moderate calcification of the aortic valve. Aortic valve regurgitation is not visualized. Mild to moderate aortic valve sclerosis/calcification is present, without any evidence of aortic stenosis.  8. The inferior vena cava is dilated in size with <50% respiratory variability, suggesting right atrial pressure of 15 mmHg. FINDINGS  Left Ventricle: Left ventricular ejection fraction, by estimation, is 60 to 65%. The left ventricle has normal function. The left ventricle has no regional wall motion abnormalities. The left ventricular internal cavity size was normal in size. There is  no left ventricular hypertrophy. Left ventricular diastolic parameters are indeterminate. Right Ventricle: The right ventricular size is normal. No increase in right ventricular wall thickness. Right ventricular systolic function is normal. There is severely elevated pulmonary artery systolic pressure. The tricuspid regurgitant velocity is 3.73 m/s, and with an assumed right atrial pressure of 15 mmHg, the estimated right ventricular systolic pressure is [id number]. Left Atrium: Left atrial size was severely dilated. Right Atrium: Right atrial size was severely dilated. Pericardium: There is no evidence of pericardial effusion. Mitral Valve: Thickend with prolapse of posterior segment Splay artifact overall liekly moderate MR. The mitral valve is normal in structure. Moderate mitral valve regurgitation. No evidence of mitral valve stenosis. Tricuspid Valve: The tricuspid valve is normal in structure. Tricuspid valve regurgitation is  severe. No evidence of tricuspid stenosis. Aortic Valve: The aortic valve is tricuspid. There is moderate calcification of the aortic valve. Aortic valve regurgitation is not visualized. Mild to moderate aortic valve sclerosis/calcification is present, without any evidence of aortic stenosis. Pulmonic Valve: The pulmonic valve was normal in structure. Pulmonic valve regurgitation is not visualized. No evidence of pulmonic stenosis. Aorta: The aortic root is normal in size and structure. Venous: The inferior vena cava is dilated in size with less than 50% respiratory variability, suggesting right atrial pressure of 15 mmHg. IAS/Shunts: No atrial level shunt detected by color flow Doppler.  LEFT VENTRICLE PLAX 2D LVIDd:         4.80 cm LVIDs:  3.10 cm LV PW:         1.70 cm LV IVS:        1.00 cm LVOT diam:     2.10 cm LV SV:         68 LV SV Index:   35 LVOT Area:     3.46 cm  LV Volumes (MOD) LV vol d, MOD A2C: 139.0 ml LV vol d, MOD A4C: 110.0 ml LV vol s, MOD A2C: 39.7 ml LV vol s, MOD A4C: 35.1 ml LV SV MOD A2C:     99.3 ml LV SV MOD A4C:     110.0 ml LV SV MOD BP:      86.4 ml RIGHT VENTRICLE RV S prime:     9.25 cm/s TAPSE (M-mode): 1.5 cm LEFT ATRIUM              Index       RIGHT ATRIUM           Index LA diam:        5.70 cm  2.94 cm/m  RA Area:     44.10 cm LA Vol (A2C):   171.0 ml 88.33 ml/m RA Volume:   170.00 ml 87.81 ml/m LA Vol (A4C):   92.8 ml  47.93 ml/m LA Biplane Vol: 134.0 ml 69.21 ml/m  AORTIC VALVE LVOT Vmax:   118.33 cm/s LVOT Vmean:  71.400 cm/s LVOT VTI:    0.196 m  AORTA Ao Root diam: 3.10 cm TRICUSPID VALVE TR Peak grad:   55.7 mmHg TR Vmax:        373.00 cm/s  SHUNTS Systemic VTI:  0.20 m Systemic Diam: 2.10 cm Jenkins Rouge MD Electronically signed by Jenkins Rouge MD Signature Date/Time: 12/12/2020/3:50:28 PM    Final      ELIGIBLE FOR AVAILABLE RESEARCH PROTOCOL: no  ASSESSMENT: 87 y.o. [place] man admitted 09/08/2018 with severe iron deficiency anemia, with extensive  but unrevealing GI work-up  (a) on iron infusions as needed for ferritin <50 or Hb <11  (b) on ferrous gluconate daily  PLAN: John [name] does very well with his current hemoglobin, which is a little over 12.  He did not do well with a hemoglobin just above 10.  I suspect 11.0 or 11.5 may be the level at which she starts to experience extra symptoms.  Accordingly our goal is to keep his hemoglobin above 12 if possible.  He has not required transfusions for this, instead he does require intermittent iron infusions.  He tolerates Venofer particularly well and certainly we can repeat this as needed.  We will be following his CBC and iron studies every 3 months and if the hemoglobin drops below 11 or the ferritin below 50, we will proceed to Venofer supplementation.  Otherwise he will continue on oral iron.  The source of his iron loss is likely to be gastro intestinal, but we have not documented a specific place.  He is on blood thinners which of course would cause a very small bleed to be more persistent.  I do not believe further GI work-up at this point is warranted.  Total encounter time 25 minutes.Sarajane Jews C. Gaudencio Chesnut, MD 01/08/2021 5:52 PM Medical Oncology and Hematology [facility] [place], [place] [address] Tel. [phone]    Fax. [phone]   This document serves as a record of services personally performed by Lurline Del, MD. It was created on his behalf by Wilburn Mylar, a trained medical scribe.  The creation of this record is based on the scribe's personal observations and the provider's statements to them.   I, Lurline Del MD, have reviewed the above documentation for accuracy and completeness, and I agree with the above.   *Total Encounter Time as defined by the Centers for Medicare and Medicaid Services includes, in addition to the face-to-face time of a patient visit (documented in the note above) non-face-to-face time: obtaining and  reviewing outside history, ordering and reviewing medications, tests or procedures, care coordination (communications with other health care professionals or caregivers) and documentation in the medical record.

## 2021-01-09 ENCOUNTER — Ambulatory Visit: Payer: Medicare PPO | Admitting: Student

## 2021-01-09 ENCOUNTER — Encounter: Payer: Self-pay | Admitting: Student

## 2021-01-09 VITALS — BP 130/70 | HR 63 | Temp 98.1°F | Ht 70.5 in | Wt 167.4 lb

## 2021-01-09 DIAGNOSIS — J449 Chronic obstructive pulmonary disease, unspecified: Secondary | ICD-10-CM | POA: Diagnosis not present

## 2021-01-09 DIAGNOSIS — R0600 Dyspnea, unspecified: Secondary | ICD-10-CM

## 2021-01-09 DIAGNOSIS — I272 Pulmonary hypertension, unspecified: Secondary | ICD-10-CM | POA: Diagnosis not present

## 2021-01-09 DIAGNOSIS — R0609 Other forms of dyspnea: Secondary | ICD-10-CM

## 2021-01-09 LAB — IRON AND TIBC
Iron: 125 ug/dL (ref 42–163)
Saturation Ratios: 39 % (ref 20–55)
TIBC: 323 ug/dL (ref 202–409)
UIBC: 198 ug/dL (ref 117–376)

## 2021-01-09 LAB — FERRITIN: Ferritin: 54 ng/mL (ref 24–336)

## 2021-01-09 NOTE — Progress Notes (Signed)
Synopsis: Referred for asthma by Liliane Shi, PA-C  Subjective:   PATIENT ID: John Holder GENDER: male DOB: 1931-11-30, MRN: [id number]  Chief Complaint  Patient presents with   Consult    Referred for [facility]   88yM with history of IDA, never smoker, ?asthma, persistent AF, mitral regurgitation, pulmonary hypertension (V/Q 8/18 negative, PFTs with normal DLCO but do show mild obstruction [post BD FEV1 of 94% predicted], TTE indeterminate for diastolic dysfunction, moderate MR, severe TR, has had severe left and right atrial dilation), GERD, left renal mass s/p nephrectomy who is referred for possible asthma and PH.   He says he only has DOE when he has anemia - this has been an issue for about 3 years. Unclear source of blood loss. He says when his hemoglobin is normal he has no activity limitation at all due to dyspnea. Plays tennis, walks his dog. Only time he notices dyspnea when his anemia is corrected is when he goes up a super steep, sustained incline. With Hb down to 10 he has to stop playing tennis, can't walk his usual route.   He has no cough, CP, orthopnea at all. Never had asthma as a kid. He has never smoked or vaped although he has been around second hand smoke for most of his life.    He snores mildly. He has no excessive daytime sleepiness. He has no PND.   Some knee pain but insignificant morning stiffness, no rashes.  Otherwise pertinent review of systems is negative.  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 Never took diet pills, ADHD medication.    Past Medical History:  Diagnosis Date   Blood transfusion without reported diagnosis    Cataract    removed in replace bilateral   GERD (gastroesophageal reflux disease)    Hypertension    Iron deficiency anemia    amdx 08/2018 >> Tx with PRBCs and Feraheme started; no GI  source per workup; followed by Heme (Dr. Jana Hakim) and GI (Dr. Cristina Gong)    Left renal mass    s/p L nephrectomy   Mitral regurgitation    Echo 02/2016:  Mild conc LVH, EF 55-60, mild to mod MR, mod LAE, mod to severe RAE, mod TR, PASP 41 // Echo 10/2018: EF 60-65, mod conc LVH, severe BAE, trivial eff, mild MVP with mod MR, mod to severe TR, asc aorta 36 (mild dilation)    Persistent Atrial Fibrillation    Pulmonary hypertension ([place])    Echocardiogram 7/22: EF 60-65, no RWMA, normal RVSF, RVSP 70.7 (severe pulmonary hypertension), severe BAE, mod MR, severe TR, mild to mod AV sclerosis w/o AS     Family History  Problem Relation Age of Onset   Breast cancer Mother    Hypertension Father    Diabetes Neg Hx    Stroke Neg Hx    CAD Neg Hx    Colon cancer Neg Hx    Colon polyps Neg Hx    Esophageal cancer Neg Hx    Rectal cancer Neg Hx    Stomach cancer Neg Hx      Past Surgical History:  Procedure Laterality Date   CARDIOVERSION N/A 02/04/2016   Procedure: CARDIOVERSION;  Surgeon: Thayer Headings, MD;  Location: [facility] ENDOSCOPY;  Service: Cardiovascular;  Laterality: N/A;   COLONOSCOPY  COLONOSCOPY WITH PROPOFOL N/A 09/10/2018   Procedure: COLONOSCOPY WITH PROPOFOL;  Surgeon: Ronald Lobo, MD;  Location: [facility] ENDOSCOPY;  Service: Endoscopy;  Laterality: N/A;   ESOPHAGOGASTRODUODENOSCOPY (EGD) WITH PROPOFOL N/A 09/10/2018   Procedure: ESOPHAGOGASTRODUODENOSCOPY (EGD) WITH PROPOFOL;  Surgeon: Ronald Lobo, MD;  Location: [facility] ENDOSCOPY;  Service: Endoscopy;  Laterality: N/A;   GIVENS CAPSULE STUDY N/A 09/10/2018   Procedure: GIVENS CAPSULE STUDY;  Surgeon: Ronald Lobo, MD;  Location: [facility] ENDOSCOPY;  Service: Endoscopy;  Laterality: N/A;    Social History   Socioeconomic History   Marital status: Widowed    Spouse name: Not on file   Number of children: 3   Years of education: Not on file   Highest education level: Not on file  Occupational History   Occupation: judge,  Chief Executive Officer, professor    Comment: retired from all 3  Tobacco Use   Smoking status: Never   Smokeless tobacco: Never  Vaping Use   Vaping Use: Never used  Substance and Sexual Activity   Alcohol use: Yes    Alcohol/week: 0.0 standard drinks    Comment: occasional   Drug use: No   Sexual activity: Not on file  Other Topics Concern   Not on file  Social History Narrative   Not on file   Social Determinants of Health   Financial Resource Strain: Not on file  Food Insecurity: Not on file  Transportation Needs: Not on file  Physical Activity: Not on file  Stress: Not on file  Social Connections: Not on file  Intimate Partner Violence: Not on file     No Known Allergies   Outpatient Medications Prior to Visit  Medication Sig Dispense Refill   ALPRAZolam (XANAX) 0.25 MG tablet Take 0.25 mg by mouth as needed for anxiety.   1   apixaban (ELIQUIS) 5 MG TABS tablet TAKE 1 TABLET BY MOUTH TWICE DAILY 180 tablet 3   Apoaequorin (PREVAGEN EXTRA STRENGTH PO) Takes once daily     Barberry-Oreg Grape-Goldenseal (BERBERINE COMPLEX PO) Take 600 mg by mouth daily. Takes 2 capsule once daily     ciclopirox (PENLAC) 8 % solution      Coenzyme Q10 (CO Q10) 200 MG CAPS Takes one capsule once daily     ferrous gluconate (FERGON) 324 MG tablet Take 324 mg by mouth daily with breakfast.     furosemide (LASIX) 40 MG tablet Take 1 tablet (40 mg total) by mouth 3 (three) times a week on Monday, Wednesday, and Friday 90 tablet 3   glucosamine-chondroitin 500-400 MG tablet Take 1 tablet by mouth 2 (two) times daily.      KRILL OIL PO Take 1 capsule by mouth daily at 12 noon.     Linoleic Acid Conjugated 1000 MG CAPS Takes one capsule twice daily     losartan (COZAAR) 50 MG tablet TAKE 1 TABLET BY MOUTH EVERY DAY 90 tablet 1   Magnesium Glycinate 665 MG CAPS Takes one capsule twice daily     Melatonin 5 MG TABS Takes one tablet at bedtime     metoprolol succinate (TOPROL-XL) 50 MG 24 hr tablet TAKE 1  TABLET (50 MG TOTAL) BY MOUTH 2 (TWO) TIMES DAILY. TAKE WITH OR IMMEDIATELY FOLLOWING A MEAL. 180 tablet 1   Multiple Vitamin (MULTIVITAMIN) capsule Take 1 capsule by mouth daily. Takes two tablets once daily     NON FORMULARY Take 1 capsule by mouth 2 (two) times daily. TRU vitamin     omeprazole (PRILOSEC) 20 MG capsule Take  20 mg by mouth daily.     OVER THE COUNTER MEDICATION Uses 3 different types of Herbal combinations for prostate, one for urinary care and one for liver care. It's 3 separate bottles     potassium chloride SA (KLOR-CON) 20 MEQ tablet Take 1 tablet (20 mEq total) by mouth 3 (three) times a week on Monday, Wednesday and Friday 90 tablet 3   tadalafil (CIALIS) 20 MG tablet TAKE ONE TABLET BY MOUTH DAILY AS NEEDED 30 tablet 11   Chromium Picolinate 400 MCG TABS Takes once tablet twice daily     methocarbamol (ROBAXIN) 500 MG tablet TAKE 1-2 TABLETS BY MOUTH EVERY SIX TO EIGHT HOURS AS NEEDED FOR SPASMS/MUSCLE TENSION (Patient not taking: No sig reported) 60 tablet 2   Garlic 123XX123 MG CAPS Takes one capsule twice daily (Patient not taking: Reported on 12/08/2020)     triamcinolone (KENALOG) 0.1 % APPLY 1 APPLICATION ON THE SKIN TO AFFECTED AREAS TWICE A DAY (Patient not taking: Reported on 12/08/2020) 80 g 0   No facility-administered medications prior to visit.       Objective:   Physical Exam:  General appearance: 87 y.o., male, NAD, conversant  Eyes: anicteric sclerae, moist conjunctivae; no lid-lag; PERRL, tracking appropriately HENT: NCAT; oropharynx, MMM, no mucosal ulcerations; normal hard and soft palate Neck: Trachea midline; no lymphadenopathy, no JVD Lungs: CTAB, no crackles, no wheeze, with normal respiratory effort CV: bradycardic IRIR, +systolic murmur Abdomen: Soft, non-tender; non-distended, BS present  Extremities: No peripheral edema, radial and DP pulses present bilaterally  Skin: Normal temperature, turgor and texture; no rash Psych: Appropriate  affect Neuro: Alert and oriented to person and place, no focal deficit    Vitals:   01/09/21 1537  BP: 130/70  Pulse: 63  Temp: 98.1 F (36.7 C)  TempSrc: Oral  SpO2: 96%  Weight: 167 lb 6.4 oz (75.9 kg)  Height: 5' 10.5" (1.791 m)   96% on RA BMI Readings from Last 3 Encounters:  01/09/21 23.68 kg/m  01/08/21 23.29 kg/m  12/08/20 22.82 kg/m   Wt Readings from Last 3 Encounters:  01/09/21 167 lb 6.4 oz (75.9 kg)  01/08/21 167 lb (75.8 kg)  12/08/20 163 lb 9.6 oz (74.2 kg)     CBC    Component Value Date/Time   WBC 2.9 (L) 01/08/2021 1501   RBC 4.41 01/08/2021 1501   RBC 4.42 01/08/2021 1501   HGB 12.8 (L) 01/08/2021 1501   HGB 10.0 (L) 11/26/2020 1312   HCT 39.7 01/08/2021 1501   HCT 31.3 (L) 11/26/2020 1312   PLT 126 (L) 01/08/2021 1501   PLT 159 11/26/2020 1312   MCV 89.8 01/08/2021 1501   MCV 83 11/26/2020 1312   MCH 29.0 01/08/2021 1501   MCHC 32.2 01/08/2021 1501   RDW 19.9 (H) 01/08/2021 1501   RDW 15.7 (H) 11/26/2020 1312   LYMPHSABS 0.7 01/08/2021 1501   MONOABS 0.4 01/08/2021 1501   EOSABS 0.2 01/08/2021 1501   BASOSABS 0.0 01/08/2021 1501     Chest Imaging: CXR 12/31/20 reviewed by me and remarkable for borderline enlarged CMS  Pulmonary Functions Testing Results: PFT Results Latest Ref Rng & Units 12/29/2020  FVC-Pre L 3.29  FVC-Predicted Pre % 87  FVC-Post L 3.64  FVC-Predicted Post % 96  Pre FEV1/FVC % % 62  Post FEV1/FCV % % 68  FEV1-Pre L 2.05  FEV1-Predicted Pre % 78  FEV1-Post L 2.47  DLCO uncorrected ml/min/mmHg 19.97  DLCO UNC% % 84  DLCO corrected  ml/min/mmHg 23.76  DLCO COR %Predicted % 100  DLVA Predicted % 108  TLC L 6.48  TLC % Predicted % 91  RV % Predicted % 112     Echocardiogram:   TTE 12/12/20: Normal EF, indeterminate for diastolic dysfunction, severely elevated RVSP, severe BAE, moderate MR, severe TR, IVC dilated with <50% collapsibility     Assessment & Plan:   # Mild obstructive lung  disease: Unclear etiology in this never smoker. CXR without emphysema, lung bases on CT A/P with a couple foci of scarring. I think his obstruction and bronchodilator response could be caused by chronic CHF (Magnussen, et al, Eur Journal Heart Fail, 2017; Hurst, Heart Lung, 2013). Possible asthma but he really only has any respiratory symptoms when he is anemic, his only symptom is dyspnea, and his dyspnea is completely relieved by correcting his anemia.   # DOE: # Pulmonary hypertension:  His MR, PH, and TR and anemia are likely the major contributors to his DOE. Regarding his PH, he has significant burden of underlying left heart disease with severe left atrial enlargement, moderate MR. Other potential contributors to [place] include possible cirrhosis/portopulmonary HTN with longstanding TR, OSA although he has little in way of symptoms to suggest this (no loud snoring, PND, excessive daytime sleepiness) and the clinical benefit of PAP therapy in older adults is less robust. His mild obstructive lung disease with no gas exchange impairment is unlikely to be playing significant role.    Plan: - walking oximetry was normal today - given relatively mild symptom burden, mild spirometric ventilatory impairment, normal DLCO and thus likely limited role that his obstructive lung disease is playing, we will defer starting inhaler at this time  - US abdomen to evaluate for cirrhosis - agree with referral to advanced heart failure and consideration of right heart catheterization and/or valve repair  I spent 61 minutes dedicated to the care of this patient on the date of this encounter to include pre-visit review of records, face-to-face time with the patient discussing various causes of pulmonary hypertension and diagnostic and treatment options, post visit ordering of testing, clinical documentation with the electronic health record, and communicating necessary findings to members of the patients care team.        Maryjane Hurter, MD [facility] 01/09/2021 4:24 PM

## 2021-01-09 NOTE — Patient Instructions (Addendum)
-   Your obstructive lung disease is very mild and unlikely to be contributing substantially to your pulmonary hypertension - If you notice cough, wheezing, or if your activity limitation worsens, then I am happy to talk to you about trying an inhaler - You have so few symptoms to suggest sleep apnea, I don't feel strongly about ordering a sleep study however you can discuss this as well with cardiology since this is one factor that can contribue to pulmonary hypertension - I've ordered an abdominal ultrasound to look for cirrhosis - right heart catheterization: this is a technique to measure pressures in your right atrium, ventricle, pulmonary arteries and (indirectly) the left atrial and ventricular pressure. This procedure would help determine whether you'd be a candidate for valvular repair and/or vasodilator therapy (treatment of pulmonary hypertension)

## 2021-01-14 ENCOUNTER — Ambulatory Visit (HOSPITAL_COMMUNITY): Payer: Medicare PPO

## 2021-01-14 ENCOUNTER — Other Ambulatory Visit (HOSPITAL_COMMUNITY): Payer: Self-pay

## 2021-01-14 ENCOUNTER — Other Ambulatory Visit (HOSPITAL_COMMUNITY): Payer: Medicare PPO

## 2021-01-16 ENCOUNTER — Other Ambulatory Visit (HOSPITAL_COMMUNITY): Payer: Self-pay

## 2021-01-21 ENCOUNTER — Encounter (HOSPITAL_COMMUNITY): Payer: Self-pay | Admitting: Cardiology

## 2021-01-21 ENCOUNTER — Other Ambulatory Visit: Payer: Self-pay

## 2021-01-21 ENCOUNTER — Other Ambulatory Visit (HOSPITAL_COMMUNITY): Payer: Self-pay | Admitting: *Deleted

## 2021-01-21 ENCOUNTER — Other Ambulatory Visit (HOSPITAL_COMMUNITY): Payer: Self-pay

## 2021-01-21 ENCOUNTER — Ambulatory Visit (HOSPITAL_COMMUNITY)
Admission: RE | Admit: 2021-01-21 | Discharge: 2021-01-21 | Disposition: A | Discharge status: Home / Self Care | Payer: Medicare PPO | Source: Ambulatory Visit | Attending: Cardiology | Admitting: Cardiology

## 2021-01-21 VITALS — BP 120/70 | HR 67 | Wt 166.6 lb

## 2021-01-21 DIAGNOSIS — Z8249 Family history of ischemic heart disease and other diseases of the circulatory system: Secondary | ICD-10-CM | POA: Diagnosis not present

## 2021-01-21 DIAGNOSIS — D509 Iron deficiency anemia, unspecified: Secondary | ICD-10-CM | POA: Insufficient documentation

## 2021-01-21 DIAGNOSIS — I517 Cardiomegaly: Secondary | ICD-10-CM | POA: Diagnosis not present

## 2021-01-21 DIAGNOSIS — Z7901 Long term (current) use of anticoagulants: Secondary | ICD-10-CM | POA: Insufficient documentation

## 2021-01-21 DIAGNOSIS — I361 Nonrheumatic tricuspid (valve) insufficiency: Secondary | ICD-10-CM

## 2021-01-21 DIAGNOSIS — R0600 Dyspnea, unspecified: Secondary | ICD-10-CM | POA: Diagnosis not present

## 2021-01-21 DIAGNOSIS — I340 Nonrheumatic mitral (valve) insufficiency: Secondary | ICD-10-CM

## 2021-01-21 DIAGNOSIS — I482 Chronic atrial fibrillation, unspecified: Secondary | ICD-10-CM

## 2021-01-21 DIAGNOSIS — Z7984 Long term (current) use of oral hypoglycemic drugs: Secondary | ICD-10-CM | POA: Insufficient documentation

## 2021-01-21 DIAGNOSIS — I272 Pulmonary hypertension, unspecified: Secondary | ICD-10-CM

## 2021-01-21 DIAGNOSIS — I5032 Chronic diastolic (congestive) heart failure: Secondary | ICD-10-CM

## 2021-01-21 DIAGNOSIS — Z79899 Other long term (current) drug therapy: Secondary | ICD-10-CM | POA: Insufficient documentation

## 2021-01-21 LAB — BASIC METABOLIC PANEL
Anion gap: 7 (ref 5–15)
BUN: 21 mg/dL (ref 8–23)
CO2: 27 mmol/L (ref 22–32)
Calcium: 9.2 mg/dL (ref 8.9–10.3)
Chloride: 100 mmol/L (ref 98–111)
Creatinine, Ser: 1.05 mg/dL (ref 0.61–1.24)
GFR, Estimated: >60 mL/min (ref >60)
Glucose, Bld: 91 mg/dL (ref 70–99)
Potassium: 4.3 mmol/L (ref 3.5–5.1)
Sodium: 134 mmol/L — ABNORMAL LOW (ref 135–145)

## 2021-01-21 LAB — BRAIN NATRIURETIC PEPTIDE: B Natriuretic Peptide: 409 pg/mL — ABNORMAL HIGH (ref 0.0–100.0)

## 2021-01-21 MED ORDER — POTASSIUM CHLORIDE CRYS ER 20 MEQ PO TBCR
40.0000 meq | EXTENDED_RELEASE_TABLET | Freq: Every day | ORAL | 2 refills | Status: DC
  Filled 2021-03-25: qty 60, 30d supply, fill #0

## 2021-01-21 MED ORDER — SODIUM CHLORIDE 0.9% FLUSH: 3.0000 mL | Freq: Two times a day (BID) | INTRAVENOUS | Status: DC

## 2021-01-21 MED ORDER — FUROSEMIDE 40 MG PO TABS
40.0000 mg | ORAL_TABLET | Freq: Every day | ORAL | 3 refills | Status: DC
  Filled 2021-03-17 – 2021-03-25 (×2): qty 30, 30d supply, fill #0
  Filled 2021-04-27: qty 30, 30d supply, fill #1
  Filled 2021-05-27: qty 30, 30d supply, fill #2

## 2021-01-21 MED ORDER — EMPAGLIFLOZIN 10 MG PO TABS
10.0000 mg | ORAL_TABLET | Freq: Every day | ORAL | 6 refills | Status: DC
  Filled 2021-04-14: qty 30, 30d supply, fill #0
  Filled 2021-04-29: qty 30, 30d supply, fill #1
  Filled 2021-06-06: qty 30, 30d supply, fill #2
  Filled 2021-07-15: qty 30, 30d supply, fill #3
  Filled 2021-08-13: qty 30, 30d supply, fill #4

## 2021-01-21 NOTE — Patient Instructions (Signed)
Increase Furosemide to 40 mg Daily  Increase Potassium to 40 meq (2 tabs) Daily  Start Jardiance 10 mg Daily  Your provider has prescribed Jardiance for you. Please be aware the most common side effect of this medication is urinary tract infections and yeast infections. Please practice good hygiene and keep this area clean and dry to help prevent this. If you do begin to have symptoms of these infections, such as difficulty urinating or painful urination,  please let us know.  Labs done today, your results will be available in MyChart, we will contact you for abnormal readings.  Your physician recommends that you return for lab work in: 10-14 days  Your physician has requested that you have a TEE. During a TEE, sound waves are used to create images of your heart. It provides your doctor with information about the size and shape of your heart and how well your heart's chambers and valves are working. In this test, a transducer is attached to the end of a flexible tube that's guided down your throat and into your esophagus (the tube leading from you mouth to your stomach) to get a more detailed image of your heart. You are not awake for the procedure. Please see the instruction sheet given to you today. For further information please visit [email or web address]. See instructions below  Heart Catheterization, see instructions below  Your physician recommends that you schedule a follow-up appointment in: 3-4 weeks  If you have any questions or concerns before your next appointment please send [place] a message through [place] or call our office at [phone].    TO LEAVE A MESSAGE FOR THE NURSE SELECT OPTION 2, PLEASE LEAVE A MESSAGE INCLUDING: YOUR NAME DATE OF BIRTH CALL BACK NUMBER REASON FOR CALL**this is important as we prioritize the call backs  YOU WILL RECEIVE A CALL BACK THE SAME DAY AS LONG AS YOU CALL BEFORE 4:00 PM  At the [facility], you and your health needs are  our priority. As part of our continuing mission to provide you with exceptional heart care, we have created designated Provider Care Teams. These Care Teams include your primary Cardiologist (physician) and Advanced Practice Providers (APPs- Physician Assistants and Nurse Practitioners) who all work together to provide you with the care you need, when you need it.   You may see any of the following providers on your designated Care Team at your next follow up: Dr Glori Bickers Dr Loralie Champagne Dr Patrice Paradise, NP Lyda Jester, [place] Ginnie Smart Audry Riles, PharmD   Please be sure to bring in all your medications bottles to every appointment.     TEE AND CATHETERIZATION INSTRUCTIONS:  You are scheduled for a TEE and Cardiac Catheterization on Tuesday, September 13 with Dr. Loralie Champagne.  1. Please arrive at the [facility] (Main Entrance A) at [facility]: [address] [place], [place] [address] at 6:30 AM (This time is two hours before your procedure to ensure your preparation). Free valet parking service is available.   Special note: Every effort is made to have your procedure done on time. Please understand that emergencies sometimes delay scheduled procedures.  2. Diet: Do not eat solid foods after midnight.  The patient may have clear liquids until 5am upon the day of the procedure.  3. Labs: DONE TODAY  4. Medication instructions in preparation for your procedure:   DO NOT TAKE ELIQUIS ON Monday 9/12 OR Tuesday 9/13 AM  DO NOT TAKE  FUROSEMIDE OR JARDIANCE Tuesday 9/13 AM  On the morning of your procedure, take any morning medicines NOT listed above.  You may use sips of water.  5. Plan for one night stay--bring personal belongings. 6. Bring a current list of your medications and current insurance cards. 7. You MUST have a responsible person to drive you home. 8. Someone MUST be with you the first 24 hours after you arrive home or your  discharge will be delayed. 9. Please wear clothes that are easy to get on and off and wear slip-on shoes.  Thank you for allowing [place] to care for you!   -- [place]

## 2021-01-21 NOTE — Progress Notes (Signed)
PCP: Burnard Bunting, MD Cardiology: Dr. Acie Fredrickson HF Cardiology: Dr. Aundra Dubin  87 y.o. with history of chronic atrial fibrillation, diastolic CHF, pulmonary hypertension, [name] was referred by Dr. Acie Fredrickson for evaluation of CHF and pulmonary hypertension. Patient was found to be in atrial fibrillation back in 2017.  At that time, he had a cardioversion.  This did not hold, and he has been in atrial fibrillation since 2017 chronically as far as I can tell. He has had Fe deficiency anemia requiring Fe infusions, GI workup was unrevealing.  Also of note, he has cirrhosis by 9/21 MRI abdomen, cause uncertain (not a heavy drinker).   In the summer of 2022, his legs began to swell significantly and developed weeping sores.  He developed significant dyspnea.  He was seen in the cardiology clinic and started on Lasix.  He cut back on sodium.  Echo in 7/22 showed EF 60-65%, normal RV, PASP 71 mmHg, severe biatrial enlargement, severe TR, probably moderate MR, IVC dilated. Over time, his symptoms haves improved.  Currently, he is back to playing tennis twice a week.  No orthopnea/PND.  No dyspnea walking on flat ground, carrying groceries, or walking up stairs.  No chest pain.  No lightheadedness.  His ankles still swell by the end of the day but are improved.  He was seen by pulmonary given elevation in PA pressure on echo.  V/Q scan in 8/22 showed no chronic PE.  PFTs showed mild obstruction.   ECG (personally reviewed): Atrial fibrillation, RBBB  Labs (7/22): pro-BNP 1404 Labs (8/22): hgb 12.8, K 4.2, creatinine 1.11  PMH: 1. Pulmonary hypertension: Echo in 7/22 with PASP 71 mmHg.  - V/Q scan (8/22): no evidence for chronic PE.  - PFTs (8/22): Minimal airways obstruction 2. Fe deficiency anemia: He had extensive GI evaluation in the past that was unrevealing.  Followed by Dr. Jana Hakim, has had Fe infusions.  3. Chronic atrial fibrillation: Diagnosed in 2017, failed DCCV in 2017 and appears  to have been in atrial fibrillation since then.  4. Cirrhosis: 9/21 abdominal MRI showed cirrhosis.  No history of heavy ETOH, cause uncertain.  5. Chronic diastolic CHF: Echo (99991111) with EF 60-65%, normal RV, PASP 71 mmHg, severe biatrial enlargement, severe TR, probably moderate MR, IVC dilated.  6. Valvular heart disease: Echo in 7/22 with probably moderate MR, severe TR.  Suspect functional due to AF/dilated atria.  7. Left renal mass: S/p left nephrectomy.  8. T-spine compression fracture.   SH: Widower, occasional ETOH (not heavy), nonsmoker.  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  [facility] [employer] [facility] graduate.   Family History  Problem Relation Age of Onset   Breast cancer Mother    Hypertension Father    Diabetes Neg Hx    Stroke Neg Hx    CAD Neg Hx    Colon cancer Neg Hx    Colon polyps Neg Hx    Esophageal cancer Neg Hx    Rectal cancer Neg Hx    Stomach cancer Neg Hx    ROS: All systems reviewed and negative except as per HPI.   Current Outpatient Medications  Medication Sig Dispense Refill   ALPRAZolam (XANAX) 0.25 MG tablet Take 0.25 mg by mouth as needed for anxiety.   1   apixaban (ELIQUIS) 5 MG TABS tablet TAKE 1 TABLET BY MOUTH TWICE DAILY 180 tablet 3   Apoaequorin (PREVAGEN EXTRA STRENGTH PO) Take 1 tablet by mouth daily. Takes once  daily     Barberry-Oreg Grape-Goldenseal (BERBERINE COMPLEX PO) Take 600 mg by mouth daily. Takes 2 capsule once daily     Chromium 400 MCG TABS Take 400 mcg by mouth daily.     ciclopirox (PENLAC) 8 % solution Apply 1 application topically at bedtime as needed (toe nail fungus).     Coenzyme Q10 (CO Q10) 200 MG CAPS Take 200 mg by mouth daily. Takes one capsule once daily     empagliflozin (JARDIANCE) 10 MG TABS tablet Take 1 tablet (10 mg total) by mouth daily before breakfast. 30 tablet 6   ferrous gluconate (FERGON) 324 MG tablet Take 324 mg by mouth daily with breakfast.     glucosamine-chondroitin 500-400  MG tablet Take 1 tablet by mouth 2 (two) times daily.      KRILL OIL PO Take 1 capsule by mouth daily at 12 noon.     Linoleic Acid Conjugated 1000 MG CAPS Take 1 capsule by mouth in the morning and at bedtime. Takes one capsule twice daily     losartan (COZAAR) 50 MG tablet TAKE 1 TABLET BY MOUTH EVERY DAY 90 tablet 1   Magnesium Glycinate 665 MG CAPS Take 1 capsule by mouth in the morning and at bedtime. Takes one capsule twice daily     Melatonin 5 MG TABS Take 5 mg by mouth at bedtime. Takes one tablet at bedtime     metoprolol succinate (TOPROL-XL) 50 MG 24 hr tablet TAKE 1 TABLET (50 MG TOTAL) BY MOUTH 2 (TWO) TIMES DAILY. TAKE WITH OR IMMEDIATELY FOLLOWING A MEAL. 180 tablet 1   Multiple Vitamin (MULTIVITAMIN) capsule Take 2 capsules by mouth daily. Takes two tablets once daily     NON FORMULARY Take 1 capsule by mouth 2 (two) times daily. TRU vitamin     omeprazole (PRILOSEC) 20 MG capsule Take 20 mg by mouth daily.     potassium chloride SA (KLOR-CON) 20 MEQ tablet Take 2 tablets (40 mEq total) by mouth daily. 60 tablet 3   tadalafil (CIALIS) 20 MG tablet TAKE ONE TABLET BY MOUTH DAILY AS NEEDED 30 tablet 11   furosemide (LASIX) 40 MG tablet Take 1 tablet (40 mg total) by mouth daily. 30 tablet 3   OVER THE COUNTER MEDICATION Take 2 capsules by mouth daily. [facility]     OVER THE COUNTER MEDICATION Take 2 capsules by mouth daily. [facility]     No current facility-administered medications for this encounter.   Facility-Administered Medications Ordered in Other Encounters  Medication Dose Route Frequency Provider Last Rate Last Admin   sodium chloride flush (NS) 0.9 % injection 3 mL  3 mL Intravenous Q12H Alix Lahmann S, MD       BP 120/70   Pulse 67   Wt 75.6 kg (166 lb 9.6 oz)   SpO2 98%   BMI 23.57 kg/m  General: NAD Neck: JVP 8 cm with HJR, no thyromegaly or thyroid nodule.  Lungs: Clear to auscultation bilaterally with normal respiratory effort. CV: Nondisplaced PMI.   Heart irregular S1/S2, no S3/S4, 3/6 HSM LLSB/apex.  1+ edema to knees.  No carotid bruit.  Normal pedal pulses.  Abdomen: Soft, nontender, no hepatosplenomegaly, no distention.  Skin: Intact without lesions or rashes.  Neurologic: Alert and oriented x 3.  Psych: Normal affect. Extremities: No clubbing or cyanosis.  HEENT: Normal.   Assessment/Plan: 1. Chronic diastolic CHF: Echo in 99991111 with EF 60-65%, normal RV, PASP 71 mmHg, severe biatrial enlargement, severe TR, probably moderate MR,  IVC dilated.  In the summer of 2022, he developed symptomatic CHF, this has improved with diuresis and sodium restriction.  I suspect that he has CHF due to long-standing atrial fibrillation with biatrial severe enlargement and likely secondary TR and MR.  He describes minimal symptoms currently, NYHA class II, but on exam, he does remain volume overloaded.  - Increase Lasix to 40 mg daily. BMET/BNP today and in 10 days.  - Increase KCl to 40 mEq daily.  - Add Jardiance 10 mg daily versus Farxiga 10 mg daily.   - I will arrange for RHC to assess filling pressures and PA pressure (see below).  2. Pulmonary hypertension: PASP 71 on 7/22 echo.  This is in the setting of long-standing atrial fibrillation with severely dilated atria and severe TR/moderate MR by echo.  I suspect that pulmonary hypertension is most likely group 2 (pulmonary venous HTN) from elevated LA pressure. V/Q scan did not show chronic PEs and PFTs showed only mild obstruction (doubt significant lung parenchymal disease).  He does have cirrhosis by imaging, but suspect portopulmonary hypertension is less likely.   - I will arrange for RHC to assess filling pressures with increased diuresis (as above) and rule out any component of pulmonary arterial hypertension. We discussed risks/benefits and he agreed to procedure.  Will hold Eliquis the day prior to and the day of procedure.  3. Atrial fibrillation: This is chronic and likely permanent (present  since 2017).  The atria are severely dilated on echo. I do not think that he would be likely to hold NSR with DCCV, even with anti-arrhythmic.  - Continue Eliquis.  - Good rate control on current Toprol XL.  4. Valvular heart disease: Echo in 7/22 showed severe TR and at least moderate MR.  I suspect this is atrial function MR and TR with severe biatrial enlargement.  - I will arrange for TEE at time of RHC to more closely assess the valves.  ?candidate for MV/TV clipping, but may improve with diuresis. We discussed risks/benefits and he agrees to procedure.   Loralie Champagne 01/21/2021

## 2021-01-21 NOTE — H&P (View-Only) (Signed)
PCP: Burnard Bunting, MD Cardiology: Dr. Acie Fredrickson HF Cardiology: Dr. Aundra Dubin  87 y.o. with history of chronic atrial fibrillation, diastolic CHF, pulmonary hypertension, [name] was referred by Dr. Acie Fredrickson for evaluation of CHF and pulmonary hypertension. Patient was found to be in atrial fibrillation back in 2017.  At that time, he had a cardioversion.  This did not hold, and he has been in atrial fibrillation since 2017 chronically as far as I can tell. He has had Fe deficiency anemia requiring Fe infusions, GI workup was unrevealing.  Also of note, he has cirrhosis by 9/21 MRI abdomen, cause uncertain (not a heavy drinker).   In the summer of 2022, his legs began to swell significantly and developed weeping sores.  He developed significant dyspnea.  He was seen in the cardiology clinic and started on Lasix.  He cut back on sodium.  Echo in 7/22 showed EF 60-65%, normal RV, PASP 71 mmHg, severe biatrial enlargement, severe TR, probably moderate MR, IVC dilated. Over time, his symptoms haves improved.  Currently, he is back to playing tennis twice a week.  No orthopnea/PND.  No dyspnea walking on flat ground, carrying groceries, or walking up stairs.  No chest pain.  No lightheadedness.  His ankles still swell by the end of the day but are improved.  He was seen by pulmonary given elevation in PA pressure on echo.  V/Q scan in 8/22 showed no chronic PE.  PFTs showed mild obstruction.   ECG (personally reviewed): Atrial fibrillation, RBBB  Labs (7/22): pro-BNP 1404 Labs (8/22): hgb 12.8, K 4.2, creatinine 1.11  PMH: 1. Pulmonary hypertension: Echo in 7/22 with PASP 71 mmHg.  - V/Q scan (8/22): no evidence for chronic PE.  - PFTs (8/22): Minimal airways obstruction 2. Fe deficiency anemia: He had extensive GI evaluation in the past that was unrevealing.  Followed by Dr. Jana Hakim, has had Fe infusions.  3. Chronic atrial fibrillation: Diagnosed in 2017, failed DCCV in 2017 and appears  to have been in atrial fibrillation since then.  4. Cirrhosis: 9/21 abdominal MRI showed cirrhosis.  No history of heavy ETOH, cause uncertain.  5. Chronic diastolic CHF: Echo (99991111) with EF 60-65%, normal RV, PASP 71 mmHg, severe biatrial enlargement, severe TR, probably moderate MR, IVC dilated.  6. Valvular heart disease: Echo in 7/22 with probably moderate MR, severe TR.  Suspect functional due to AF/dilated atria.  7. Left renal mass: S/p left nephrectomy.  8. T-spine compression fracture.   SH: Widower, occasional ETOH (not heavy), nonsmoker.  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  [employer] graduate.   Family History  Problem Relation Age of Onset   Breast cancer Mother    Hypertension Father    Diabetes Neg Hx    Stroke Neg Hx    CAD Neg Hx    Colon cancer Neg Hx    Colon polyps Neg Hx    Esophageal cancer Neg Hx    Rectal cancer Neg Hx    Stomach cancer Neg Hx    ROS: All systems reviewed and negative except as per HPI.   Current Outpatient Medications  Medication Sig Dispense Refill   ALPRAZolam (XANAX) 0.25 MG tablet Take 0.25 mg by mouth as needed for anxiety.   1   apixaban (ELIQUIS) 5 MG TABS tablet TAKE 1 TABLET BY MOUTH TWICE DAILY 180 tablet 3   Apoaequorin (PREVAGEN EXTRA STRENGTH PO) Take 1 tablet by mouth daily. Takes once  daily     Barberry-Oreg Grape-Goldenseal (BERBERINE COMPLEX PO) Take 600 mg by mouth daily. Takes 2 capsule once daily     Chromium 400 MCG TABS Take 400 mcg by mouth daily.     ciclopirox (PENLAC) 8 % solution Apply 1 application topically at bedtime as needed (toe nail fungus).     Coenzyme Q10 (CO Q10) 200 MG CAPS Take 200 mg by mouth daily. Takes one capsule once daily     empagliflozin (JARDIANCE) 10 MG TABS tablet Take 1 tablet (10 mg total) by mouth daily before breakfast. 30 tablet 6   ferrous gluconate (FERGON) 324 MG tablet Take 324 mg by mouth daily with breakfast.     glucosamine-chondroitin 500-400  MG tablet Take 1 tablet by mouth 2 (two) times daily.      KRILL OIL PO Take 1 capsule by mouth daily at 12 noon.     Linoleic Acid Conjugated 1000 MG CAPS Take 1 capsule by mouth in the morning and at bedtime. Takes one capsule twice daily     losartan (COZAAR) 50 MG tablet TAKE 1 TABLET BY MOUTH EVERY DAY 90 tablet 1   Magnesium Glycinate 665 MG CAPS Take 1 capsule by mouth in the morning and at bedtime. Takes one capsule twice daily     Melatonin 5 MG TABS Take 5 mg by mouth at bedtime. Takes one tablet at bedtime     metoprolol succinate (TOPROL-XL) 50 MG 24 hr tablet TAKE 1 TABLET (50 MG TOTAL) BY MOUTH 2 (TWO) TIMES DAILY. TAKE WITH OR IMMEDIATELY FOLLOWING A MEAL. 180 tablet 1   Multiple Vitamin (MULTIVITAMIN) capsule Take 2 capsules by mouth daily. Takes two tablets once daily     NON FORMULARY Take 1 capsule by mouth 2 (two) times daily. TRU vitamin     omeprazole (PRILOSEC) 20 MG capsule Take 20 mg by mouth daily.     potassium chloride SA (KLOR-CON) 20 MEQ tablet Take 2 tablets (40 mEq total) by mouth daily. 60 tablet 3   tadalafil (CIALIS) 20 MG tablet TAKE ONE TABLET BY MOUTH DAILY AS NEEDED 30 tablet 11   furosemide (LASIX) 40 MG tablet Take 1 tablet (40 mg total) by mouth daily. 30 tablet 3   OVER THE COUNTER MEDICATION Take 2 capsules by mouth daily. [facility]     OVER THE COUNTER MEDICATION Take 2 capsules by mouth daily. [facility]     No current facility-administered medications for this encounter.   Facility-Administered Medications Ordered in Other Encounters  Medication Dose Route Frequency Provider Last Rate Last Admin   sodium chloride flush (NS) 0.9 % injection 3 mL  3 mL Intravenous Q12H Yamil Oelke S, MD       BP 120/70   Pulse 67   Wt 75.6 kg (166 lb 9.6 oz)   SpO2 98%   BMI 23.57 kg/m  General: NAD Neck: JVP 8 cm with HJR, no thyromegaly or thyroid nodule.  Lungs: Clear to auscultation bilaterally with normal respiratory effort. CV: Nondisplaced PMI.   Heart irregular S1/S2, no S3/S4, 3/6 HSM LLSB/apex.  1+ edema to knees.  No carotid bruit.  Normal pedal pulses.  Abdomen: Soft, nontender, no hepatosplenomegaly, no distention.  Skin: Intact without lesions or rashes.  Neurologic: Alert and oriented x 3.  Psych: Normal affect. Extremities: No clubbing or cyanosis.  HEENT: Normal.   Assessment/Plan: 1. Chronic diastolic CHF: Echo in 99991111 with EF 60-65%, normal RV, PASP 71 mmHg, severe biatrial enlargement, severe TR, probably moderate MR,  IVC dilated.  In the summer of 2022, he developed symptomatic CHF, this has improved with diuresis and sodium restriction.  I suspect that he has CHF due to long-standing atrial fibrillation with biatrial severe enlargement and likely secondary TR and MR.  He describes minimal symptoms currently, NYHA class II, but on exam, he does remain volume overloaded.  - Increase Lasix to 40 mg daily. BMET/BNP today and in 10 days.  - Increase KCl to 40 mEq daily.  - Add Jardiance 10 mg daily versus Farxiga 10 mg daily.   - I will arrange for RHC to assess filling pressures and PA pressure (see below).  2. Pulmonary hypertension: PASP 71 on 7/22 echo.  This is in the setting of long-standing atrial fibrillation with severely dilated atria and severe TR/moderate MR by echo.  I suspect that pulmonary hypertension is most likely group 2 (pulmonary venous HTN) from elevated LA pressure. V/Q scan did not show chronic PEs and PFTs showed only mild obstruction (doubt significant lung parenchymal disease).  He does have cirrhosis by imaging, but suspect portopulmonary hypertension is less likely.   - I will arrange for RHC to assess filling pressures with increased diuresis (as above) and rule out any component of pulmonary arterial hypertension. We discussed risks/benefits and he agreed to procedure.  Will hold Eliquis the day prior to and the day of procedure.  3. Atrial fibrillation: This is chronic and likely permanent (present  since 2017).  The atria are severely dilated on echo. I do not think that he would be likely to hold NSR with DCCV, even with anti-arrhythmic.  - Continue Eliquis.  - Good rate control on current Toprol XL.  4. Valvular heart disease: Echo in 7/22 showed severe TR and at least moderate MR.  I suspect this is atrial function MR and TR with severe biatrial enlargement.  - I will arrange for TEE at time of RHC to more closely assess the valves.  ?candidate for MV/TV clipping, but may improve with diuresis. We discussed risks/benefits and he agrees to procedure.   Loralie Champagne 01/21/2021

## 2021-01-22 ENCOUNTER — Telehealth (HOSPITAL_COMMUNITY): Payer: Self-pay | Admitting: *Deleted

## 2021-01-22 ENCOUNTER — Ambulatory Visit (HOSPITAL_COMMUNITY)
Admission: RE | Admit: 2021-01-22 | Discharge: 2021-01-22 | Disposition: A | Discharge status: Home / Self Care | Payer: Medicare PPO | Source: Ambulatory Visit | Attending: Student | Admitting: Student

## 2021-01-22 ENCOUNTER — Other Ambulatory Visit (HOSPITAL_COMMUNITY): Payer: Self-pay

## 2021-01-22 DIAGNOSIS — Z79899 Other long term (current) drug therapy: Secondary | ICD-10-CM | POA: Diagnosis not present

## 2021-01-22 DIAGNOSIS — I272 Pulmonary hypertension, unspecified: Secondary | ICD-10-CM | POA: Diagnosis not present

## 2021-01-22 DIAGNOSIS — I517 Cardiomegaly: Secondary | ICD-10-CM | POA: Diagnosis not present

## 2021-01-22 DIAGNOSIS — Z905 Acquired absence of kidney: Secondary | ICD-10-CM | POA: Diagnosis not present

## 2021-01-22 DIAGNOSIS — I482 Chronic atrial fibrillation, unspecified: Secondary | ICD-10-CM | POA: Diagnosis not present

## 2021-01-22 DIAGNOSIS — I5032 Chronic diastolic (congestive) heart failure: Secondary | ICD-10-CM | POA: Diagnosis not present

## 2021-01-22 DIAGNOSIS — Z7984 Long term (current) use of oral hypoglycemic drugs: Secondary | ICD-10-CM | POA: Diagnosis not present

## 2021-01-22 DIAGNOSIS — R0600 Dyspnea, unspecified: Secondary | ICD-10-CM | POA: Diagnosis not present

## 2021-01-22 DIAGNOSIS — Z9049 Acquired absence of other specified parts of digestive tract: Secondary | ICD-10-CM | POA: Diagnosis not present

## 2021-01-22 DIAGNOSIS — Z7901 Long term (current) use of anticoagulants: Secondary | ICD-10-CM | POA: Diagnosis not present

## 2021-01-22 DIAGNOSIS — K7689 Other specified diseases of liver: Secondary | ICD-10-CM | POA: Diagnosis not present

## 2021-01-22 DIAGNOSIS — D509 Iron deficiency anemia, unspecified: Secondary | ICD-10-CM | POA: Diagnosis not present

## 2021-01-22 MED ORDER — ALPRAZOLAM 0.25 MG PO TABS
0.2500 mg | ORAL_TABLET | Freq: Four times a day (QID) | ORAL | 0 refills | Status: DC | PRN
  Filled 2021-01-22: qty 30, 8d supply, fill #0

## 2021-01-22 NOTE — Telephone Encounter (Signed)
TEE auth in clinical review.   Case [id number]

## 2021-01-27 ENCOUNTER — Ambulatory Visit (HOSPITAL_COMMUNITY)
Admission: RE | Admit: 2021-01-27 | Discharge: 2021-01-27 | Disposition: A | Discharge status: Home / Self Care | Payer: Medicare PPO | Source: Ambulatory Visit | Attending: Cardiology | Admitting: Cardiology

## 2021-01-27 ENCOUNTER — Ambulatory Visit (HOSPITAL_COMMUNITY): Payer: Medicare PPO | Admitting: Certified Registered Nurse Anesthetist

## 2021-01-27 ENCOUNTER — Encounter (HOSPITAL_COMMUNITY)
Admission: RE | Disposition: A | Discharge status: Home / Self Care | Payer: Self-pay | Source: Ambulatory Visit | Attending: Cardiology

## 2021-01-27 ENCOUNTER — Ambulatory Visit (HOSPITAL_BASED_OUTPATIENT_CLINIC_OR_DEPARTMENT_OTHER)
Admission: RE | Admit: 2021-01-27 | Discharge: 2021-01-27 | Disposition: A | Discharge status: Home / Self Care | Payer: Medicare PPO | Source: Ambulatory Visit | Attending: Cardiology | Admitting: Cardiology

## 2021-01-27 ENCOUNTER — Other Ambulatory Visit: Payer: Self-pay

## 2021-01-27 ENCOUNTER — Other Ambulatory Visit (HOSPITAL_COMMUNITY): Payer: Self-pay

## 2021-01-27 ENCOUNTER — Encounter (HOSPITAL_COMMUNITY): Payer: Self-pay | Admitting: Cardiology

## 2021-01-27 DIAGNOSIS — I272 Pulmonary hypertension, unspecified: Secondary | ICD-10-CM

## 2021-01-27 DIAGNOSIS — I482 Chronic atrial fibrillation, unspecified: Secondary | ICD-10-CM | POA: Diagnosis not present

## 2021-01-27 DIAGNOSIS — I10 Essential (primary) hypertension: Secondary | ICD-10-CM | POA: Insufficient documentation

## 2021-01-27 DIAGNOSIS — Z7984 Long term (current) use of oral hypoglycemic drugs: Secondary | ICD-10-CM | POA: Diagnosis not present

## 2021-01-27 DIAGNOSIS — Z79899 Other long term (current) drug therapy: Secondary | ICD-10-CM | POA: Insufficient documentation

## 2021-01-27 DIAGNOSIS — I340 Nonrheumatic mitral (valve) insufficiency: Secondary | ICD-10-CM | POA: Diagnosis not present

## 2021-01-27 DIAGNOSIS — I509 Heart failure, unspecified: Secondary | ICD-10-CM | POA: Diagnosis not present

## 2021-01-27 DIAGNOSIS — I2510 Atherosclerotic heart disease of native coronary artery without angina pectoris: Secondary | ICD-10-CM | POA: Diagnosis not present

## 2021-01-27 DIAGNOSIS — I081 Rheumatic disorders of both mitral and tricuspid valves: Secondary | ICD-10-CM | POA: Insufficient documentation

## 2021-01-27 DIAGNOSIS — I5032 Chronic diastolic (congestive) heart failure: Secondary | ICD-10-CM | POA: Diagnosis not present

## 2021-01-27 DIAGNOSIS — Z7901 Long term (current) use of anticoagulants: Secondary | ICD-10-CM | POA: Diagnosis not present

## 2021-01-27 DIAGNOSIS — I361 Nonrheumatic tricuspid (valve) insufficiency: Secondary | ICD-10-CM

## 2021-01-27 DIAGNOSIS — D509 Iron deficiency anemia, unspecified: Secondary | ICD-10-CM | POA: Insufficient documentation

## 2021-01-27 DIAGNOSIS — K219 Gastro-esophageal reflux disease without esophagitis: Secondary | ICD-10-CM | POA: Diagnosis not present

## 2021-01-27 DIAGNOSIS — D61818 Other pancytopenia: Secondary | ICD-10-CM | POA: Diagnosis not present

## 2021-01-27 DIAGNOSIS — I4819 Other persistent atrial fibrillation: Secondary | ICD-10-CM | POA: Diagnosis not present

## 2021-01-27 HISTORY — PX: TEE WITHOUT CARDIOVERSION: SHX5443

## 2021-01-27 HISTORY — PX: RIGHT/LEFT HEART CATH AND CORONARY ANGIOGRAPHY: CATH118266

## 2021-01-27 LAB — POCT I-STAT EG7
Acid-base deficit: 2 mmol/L (ref 0.0–2.0)
Acid-base deficit: 2 mmol/L (ref 0.0–2.0)
Bicarbonate: 25.2 mmol/L (ref 20.0–28.0)
Bicarbonate: 25.5 mmol/L (ref 20.0–28.0)
Calcium, Ion: 1.22 mmol/L (ref 1.15–1.40)
Calcium, Ion: 1.27 mmol/L (ref 1.15–1.40)
HCT: 37 % — ABNORMAL LOW (ref 39.0–52.0)
HCT: 37 % — ABNORMAL LOW (ref 39.0–52.0)
Hemoglobin: 12.6 g/dL — ABNORMAL LOW (ref 13.0–17.0)
Hemoglobin: 12.6 g/dL — ABNORMAL LOW (ref 13.0–17.0)
O2 Saturation: 73 %
O2 Saturation: 76 %
Potassium: 4 mmol/L (ref 3.5–5.1)
Potassium: 4.2 mmol/L (ref 3.5–5.1)
Sodium: 140 mmol/L (ref 135–145)
Sodium: 141 mmol/L (ref 135–145)
TCO2: 27 mmol/L (ref 22–32)
TCO2: 27 mmol/L (ref 22–32)
pCO2, Ven: 50.9 mmHg (ref 44.0–60.0)
pCO2, Ven: 51.9 mmHg (ref 44.0–60.0)
pH, Ven: 7.3 (ref 7.250–7.430)
pH, Ven: 7.303 (ref 7.250–7.430)
pO2, Ven: 43 mmHg (ref 32.0–45.0)
pO2, Ven: 45 mmHg (ref 32.0–45.0)

## 2021-01-27 SURGERY — ECHOCARDIOGRAM, TRANSESOPHAGEAL
Anesthesia: Monitor Anesthesia Care

## 2021-01-27 SURGERY — RIGHT/LEFT HEART CATH AND CORONARY ANGIOGRAPHY
Anesthesia: LOCAL

## 2021-01-27 MED ORDER — LABETALOL HCL 5 MG/ML IV SOLN: 10.0000 mg | INTRAVENOUS | Status: DC | PRN

## 2021-01-27 MED ORDER — LIDOCAINE HCL (PF) 1 % IJ SOLN
INTRAMUSCULAR | Status: DC | PRN
  Administered 2021-01-27 (×2): 2 mL

## 2021-01-27 MED ORDER — IOHEXOL 350 MG/ML SOLN
INTRAVENOUS | Status: DC | PRN
  Administered 2021-01-27: 60 mL

## 2021-01-27 MED ORDER — SODIUM CHLORIDE 0.9% FLUSH: 3.0000 mL | Freq: Two times a day (BID) | INTRAVENOUS | Status: DC

## 2021-01-27 MED ORDER — HEPARIN (PORCINE) IN NACL 1000-0.9 UT/500ML-% IV SOLN
INTRAVENOUS | Status: AC
  Filled 2021-01-27: qty 500

## 2021-01-27 MED ORDER — VERAPAMIL HCL 2.5 MG/ML IV SOLN
INTRAVENOUS | Status: DC | PRN
  Administered 2021-01-27: 10 mL via INTRA_ARTERIAL

## 2021-01-27 MED ORDER — ACETAMINOPHEN 325 MG PO TABS: 650.0000 mg | ORAL_TABLET | ORAL | Status: DC | PRN

## 2021-01-27 MED ORDER — MIDAZOLAM HCL 2 MG/2ML IJ SOLN
INTRAMUSCULAR | Status: AC
  Filled 2021-01-27: qty 2

## 2021-01-27 MED ORDER — PROPOFOL 10 MG/ML IV BOLUS
INTRAVENOUS | Status: DC | PRN
  Administered 2021-01-27: 10 mg via INTRAVENOUS

## 2021-01-27 MED ORDER — HEPARIN SODIUM (PORCINE) 1000 UNIT/ML IJ SOLN
INTRAMUSCULAR | Status: AC
  Filled 2021-01-27: qty 1

## 2021-01-27 MED ORDER — PROPOFOL 500 MG/50ML IV EMUL
INTRAVENOUS | Status: DC | PRN
  Administered 2021-01-27: 100 ug/kg/min via INTRAVENOUS

## 2021-01-27 MED ORDER — VERAPAMIL HCL 2.5 MG/ML IV SOLN
INTRAVENOUS | Status: AC
  Filled 2021-01-27: qty 2

## 2021-01-27 MED ORDER — HYDRALAZINE HCL 20 MG/ML IJ SOLN: 10.0000 mg | INTRAMUSCULAR | Status: DC | PRN

## 2021-01-27 MED ORDER — FENTANYL CITRATE (PF) 100 MCG/2ML IJ SOLN
INTRAMUSCULAR | Status: DC | PRN
  Administered 2021-01-27: 25 ug via INTRAVENOUS

## 2021-01-27 MED ORDER — SODIUM CHLORIDE 0.9% FLUSH: 3.0000 mL | INTRAVENOUS | Status: DC | PRN

## 2021-01-27 MED ORDER — EPHEDRINE SULFATE 50 MG/ML IJ SOLN
INTRAMUSCULAR | Status: DC | PRN
  Administered 2021-01-27: 10 mg via INTRAVENOUS

## 2021-01-27 MED ORDER — SODIUM CHLORIDE 0.9 % IV SOLN: INTRAVENOUS | Status: DC | PRN

## 2021-01-27 MED ORDER — SODIUM CHLORIDE 0.9 % IV SOLN: 250.0000 mL | INTRAVENOUS | Status: DC | PRN

## 2021-01-27 MED ORDER — BUTAMBEN-TETRACAINE-BENZOCAINE 2-2-14 % EX AERO
INHALATION_SPRAY | CUTANEOUS | Status: DC | PRN
  Administered 2021-01-27: 1 via TOPICAL

## 2021-01-27 MED ORDER — ASPIRIN 81 MG PO CHEW: 81.0000 mg | CHEWABLE_TABLET | ORAL | Status: DC

## 2021-01-27 MED ORDER — MIDAZOLAM HCL 2 MG/2ML IJ SOLN
INTRAMUSCULAR | Status: DC | PRN
  Administered 2021-01-27: 1 mg via INTRAVENOUS

## 2021-01-27 MED ORDER — APIXABAN 5 MG PO TABS
5.0000 mg | ORAL_TABLET | Freq: Two times a day (BID) | ORAL | 3 refills | Status: DC
  Filled 2021-01-27: qty 180, 90d supply, fill #0
  Filled 2021-05-27: qty 180, 90d supply, fill #1
  Filled 2021-09-07: qty 180, 90d supply, fill #2
  Filled 2021-12-10: qty 180, 90d supply, fill #3

## 2021-01-27 MED ORDER — LIDOCAINE 2% (20 MG/ML) 5 ML SYRINGE
INTRAMUSCULAR | Status: DC | PRN
  Administered 2021-01-27: 40 mg via INTRAVENOUS

## 2021-01-27 MED ORDER — LIDOCAINE HCL (PF) 1 % IJ SOLN
INTRAMUSCULAR | Status: AC
  Filled 2021-01-27: qty 30

## 2021-01-27 MED ORDER — FENTANYL CITRATE (PF) 100 MCG/2ML IJ SOLN
INTRAMUSCULAR | Status: AC
  Filled 2021-01-27: qty 2

## 2021-01-27 MED ORDER — HEPARIN (PORCINE) IN NACL 1000-0.9 UT/500ML-% IV SOLN
INTRAVENOUS | Status: DC | PRN
  Administered 2021-01-27 (×2): 500 mL

## 2021-01-27 MED ORDER — ONDANSETRON HCL 4 MG/2ML IJ SOLN: 4.0000 mg | Freq: Four times a day (QID) | INTRAMUSCULAR | Status: DC | PRN

## 2021-01-27 MED ORDER — SODIUM CHLORIDE 0.9 % IV SOLN: INTRAVENOUS | Status: DC

## 2021-01-27 MED ORDER — HEPARIN SODIUM (PORCINE) 1000 UNIT/ML IJ SOLN
INTRAMUSCULAR | Status: DC | PRN
  Administered 2021-01-27: 3500 [IU] via INTRAVENOUS

## 2021-01-27 MED ORDER — PHENYLEPHRINE 40 MCG/ML (10ML) SYRINGE FOR IV PUSH (FOR BLOOD PRESSURE SUPPORT)
PREFILLED_SYRINGE | INTRAVENOUS | Status: DC | PRN
  Administered 2021-01-27: 80 ug via INTRAVENOUS
  Administered 2021-01-27 (×2): 40 ug via INTRAVENOUS
  Administered 2021-01-27: 80 ug via INTRAVENOUS
  Administered 2021-01-27 (×3): 40 ug via INTRAVENOUS

## 2021-01-27 SURGICAL SUPPLY — 11 items

## 2021-01-27 NOTE — Transfer of Care (Signed)
Immediate Anesthesia Transfer of Care Note  Patient: John Holder  Procedure(s) Performed: TRANSESOPHAGEAL ECHOCARDIOGRAM (TEE)  Patient Location: Endoscopy Unit  Anesthesia Type:MAC  Level of Consciousness: drowsy and patient cooperative  Airway & Oxygen Therapy: Patient Spontanous Breathing and Patient connected to nasal cannula oxygen  Post-op Assessment: Report given to RN and Post -op Vital signs reviewed and stable  Post vital signs: Reviewed and stable  Last Vitals:  Vitals Value Taken Time  BP 121/75 01/27/21 1013  Temp 36.5 C 01/27/21 1005  Pulse 50 01/27/21 1014  Resp 19 01/27/21 1014  SpO2 100 % 01/27/21 1014  Vitals shown include unvalidated device data.  Last Pain:  Vitals:   01/27/21 1013  TempSrc:   PainSc: 0-No pain         Complications: No notable events documented.

## 2021-01-27 NOTE — Progress Notes (Signed)
Echocardiogram Echocardiogram Transesophageal has been performed.  Oneal Deputy SeniorRDCS 01/27/2021, 10:29 AM

## 2021-01-27 NOTE — Interval H&P Note (Signed)
History and Physical Interval Note:  01/27/2021 9:37 AM  John Holder  has presented today for surgery, with the diagnosis of [place].  The various methods of treatment have been discussed with the patient and family. After consideration of risks, benefits and other options for treatment, the patient has consented to  Procedure(s): TRANSESOPHAGEAL ECHOCARDIOGRAM (TEE) (N/A) as a surgical intervention.  The patient's history has been reviewed, patient examined, no change in status, stable for surgery.  I have reviewed the patient's chart and labs.  Questions were answered to the patient's satisfaction.     Urvi Imes [employer]

## 2021-01-27 NOTE — CV Procedure (Addendum)
Procedure: TEE  Sedation: Per anesthesiology  Indication: Mitral and tricuspid regurgitation.   Findings: Please see echo section for full report.  Normal left ventricular size with EF 55-60%.  Normal wall thickness and wall motion.  The right ventricle was moderately dilated with normal systolic function.  Moderate-severe left atrial enlargement with no LA appendage thrombus.  Severely dilated right atrium.  No PFO or ASD by color doppler.  There was severe tricuspid regurgitation, suspect functional due to annular dilatation.  Peak RV-RA gradient 45 mmHg.  Trileaflet aortic valve with no stenosis or regurgitation.  There was severe, highly eccentric, anteriorly-directed mitral regurgitation.  There was prolapse and partial flail of the P3 segment of the posterior mitral valve.  Vena contracta area 0.53 cm^2.  Unable to measure PISA due to highly eccentric jet.  The pulmonary veins on right and left showed systolic flattening but not definite flow reversal on the pulmonary vein doppler interrogation. Normal caliber aorta with grade 3 plaque descending thoracic aorta.   Impression: Severe TR and MR.  TR appears functional.  The MR appears to be due to leaflet abnormality primarily with P3 segment prolapse/partial flail.   John Holder 01/27/2021 10:05 AM  Discussed with Dr. Burt Knack, hopefully Mitraclip candidate.  Planned for RHC today but will add coronary angiogram due to possible mitral valve repair in future.   John Holder 01/27/2021 11:27 AM

## 2021-01-27 NOTE — Anesthesia Preprocedure Evaluation (Signed)
Anesthesia Evaluation  Patient identified by MRN, date of birth, ID band Patient awake    Reviewed: Allergy & Precautions, NPO status , Patient's Chart, lab work & pertinent test results  Airway Mallampati: II  TM Distance: >3 FB Neck ROM: Limited    Dental no notable dental hx.    Pulmonary  Severe pulmonary HTN   Pulmonary exam normal breath sounds clear to auscultation       Cardiovascular hypertension, + Valvular Problems/Murmurs MR  Rhythm:Regular Rate:Normal + Systolic murmurs    Neuro/Psych negative neurological ROS  negative psych ROS   GI/Hepatic Neg liver ROS, GERD  ,  Endo/Other  negative endocrine ROS  Renal/GU negative Renal ROS  negative genitourinary   Musculoskeletal negative musculoskeletal ROS (+)   Abdominal   Peds negative pediatric ROS (+)  Hematology negative hematology ROS (+)   Anesthesia Other Findings   Reproductive/Obstetrics negative OB ROS                             Anesthesia Physical Anesthesia Plan  ASA: 3  Anesthesia Plan: MAC   Post-op Pain Management:    Induction: Intravenous  PONV Risk Score and Plan: 1 and Treatment may vary due to age or medical condition and Propofol infusion  Airway Management Planned: Simple Face Mask  Additional Equipment:   Intra-op Plan:   Post-operative Plan:   Informed Consent: I have reviewed the patients History and Physical, chart, labs and discussed the procedure including the risks, benefits and alternatives for the proposed anesthesia with the patient or authorized representative who has indicated his/her understanding and acceptance.     Dental advisory given  Plan Discussed with: CRNA and Surgeon  Anesthesia Plan Comments:         Anesthesia Quick Evaluation

## 2021-01-27 NOTE — Anesthesia Procedure Notes (Signed)
Procedure Name: MAC Date/Time: 01/27/2021 9:23 AM Performed by: Janene Harvey, CRNA Pre-anesthesia Checklist: Patient identified, Emergency Drugs available, Suction available and Patient being monitored Patient Re-evaluated:Patient Re-evaluated prior to induction Oxygen Delivery Method: Nasal cannula Induction Type: IV induction Placement Confirmation: positive ETCO2 Dental Injury: Teeth and Oropharynx as per pre-operative assessment

## 2021-01-27 NOTE — Discharge Instructions (Addendum)
Restart apixaban tomorrow morning.    Radial Site Care  This sheet gives you information about how to care for yourself after your procedure. Your health care provider may also give you more specific instructions. If you have problems or questions, contact your health care provider. What can I expect after the procedure? After the procedure, it is common to have: Bruising and tenderness at the catheter insertion area. Follow these instructions at home: Medicines Take over-the-counter and prescription medicines only as told by your health care provider. Insertion site care Follow instructions from your health care provider about how to take care of your insertion site. Make sure you: Wash your hands with soap and water before you remove your bandage (dressing). If soap and water are not available, use hand sanitizer. May remove dressing in 24 hours. Check your insertion site every day for signs of infection. Check for: Redness, swelling, or pain. Fluid or blood. Pus or a bad smell. Warmth. Do no take baths, swim, or use a hot tub for 5 days. You may shower 24-48 hours after the procedure. Remove the dressing and gently wash the site with plain soap and water. Pat the area dry with a clean towel. Do not rub the site. That could cause bleeding. Do not apply powder or lotion to the site. Activity  For 24 hours after the procedure, or as directed by your health care provider: Do not flex or bend the affected arm. Do not push or pull heavy objects with the affected arm. Do not drive yourself home from the hospital or clinic. You may drive 24 hours after the procedure. Do not operate machinery or power tools. KEEP ARM ELEVATED THE REMAINDER OF THE DAY. Do not push, pull or lift anything that is heavier than 10 lb for 5 days. Ask your health care provider when it is okay to: Return to work or school. Resume usual physical activities or sports. Resume sexual activity. General  instructions If the catheter site starts to bleed, raise your arm and put firm pressure on the site. If the bleeding does not stop, get help right away. This is a medical emergency. DRINK PLENTY OF FLUIDS FOR THE NEXT 2-3 DAYS. No alcohol consumption for 24 hours after receiving sedation. If you went home on the same day as your procedure, a responsible adult should be with you for the first 24 hours after you arrive home. Keep all follow-up visits as told by your health care provider. This is important. Contact a health care provider if: You have a fever. You have redness, swelling, or yellow drainage around your insertion site. Get help right away if: You have unusual pain at the radial site. The catheter insertion area swells very fast. The insertion area is bleeding, and the bleeding does not stop when you hold steady pressure on the area. Your arm or hand becomes pale, cool, tingly, or numb. These symptoms may represent a serious problem that is an emergency. Do not wait to see if the symptoms will go away. Get medical help right away. Call your local emergency services (911 in the [place] Do not drive yourself to the hospital. Summary After the procedure, it is common to have bruising and tenderness at the site. Follow instructions from your health care provider about how to take care of your radial site wound. Check the wound every day for signs of infection.  This information is not intended to replace advice given to you by your health care provider. Make sure  you discuss any questions you have with your health care provider. Document Revised: 06/08/2017 Document Reviewed: 06/08/2017 Elsevier Patient Education  2020 [employer].

## 2021-01-27 NOTE — Interval H&P Note (Signed)
History and Physical Interval Note:  With severe MR on TEE today and plan for Mitral valve repair consideration, will do RHC/LHC with coronary angiography today rather than RHC alone. Discussed with patient.   01/27/2021 11:28 AM  John Holder  has presented today for surgery, with the diagnosis of [place].  The various methods of treatment have been discussed with the patient and family. After consideration of risks, benefits and other options for treatment, the patient has consented to  Procedure(s): TRANSESOPHAGEAL ECHOCARDIOGRAM (TEE) (N/A) as a surgical intervention.  The patient's history has been reviewed, patient examined, no change in status, stable for surgery.  I have reviewed the patient's chart and labs.  Questions were answered to the patient's satisfaction.     John Holder

## 2021-01-27 NOTE — Anesthesia Postprocedure Evaluation (Signed)
Anesthesia Post Note  Patient: John Holder  Procedure(s) Performed: TRANSESOPHAGEAL ECHOCARDIOGRAM (TEE)     Patient location during evaluation: PACU Anesthesia Type: MAC Level of consciousness: awake and alert Pain management: pain level controlled Vital Signs Assessment: post-procedure vital signs reviewed and stable Respiratory status: spontaneous breathing, nonlabored ventilation, respiratory function stable and patient connected to nasal cannula oxygen Cardiovascular status: stable and blood pressure returned to baseline Postop Assessment: no apparent nausea or vomiting Anesthetic complications: no   No notable events documented.  Last Vitals:  Vitals:   01/27/21 1011 01/27/21 1013  BP: (!) 88/67 121/75  Pulse: (!) 48 77  Resp: 13 18  Temp:    SpO2: 99% 99%    Last Pain:  Vitals:   01/27/21 1013  TempSrc:   PainSc: 0-No pain                 [name]

## 2021-01-27 NOTE — Progress Notes (Signed)
Pt ambulated without difficulty or bleeding.   Discharged home with his significant other who will drive and stay with pt x 24 hrs.

## 2021-01-28 ENCOUNTER — Encounter (HOSPITAL_COMMUNITY): Payer: Self-pay | Admitting: Cardiology

## 2021-01-29 ENCOUNTER — Other Ambulatory Visit: Payer: Self-pay

## 2021-01-29 ENCOUNTER — Encounter: Payer: Self-pay | Admitting: Cardiovascular Disease

## 2021-01-29 ENCOUNTER — Ambulatory Visit: Payer: Medicare PPO | Admitting: Cardiovascular Disease

## 2021-01-29 VITALS — BP 120/70 | HR 64 | Ht 71.0 in | Wt 164.0 lb

## 2021-01-29 DIAGNOSIS — I340 Nonrheumatic mitral (valve) insufficiency: Secondary | ICD-10-CM | POA: Diagnosis not present

## 2021-01-29 NOTE — H&P (View-Only) (Signed)
[facility]   MULTIDISCIPLINARY HEART VALVE TEAM  Date:  01/29/2021   ID:  John Holder, DOB 13-Jun-1931, MRN [id number]  PCP:  Burnard Bunting, MD   Chief Complaint  Patient presents with   Shortness of Breath      HISTORY OF PRESENT ILLNESS: John Holder is a 87 y.o. male who presents for evaluation of mitral regurgitation, referred by Dr Aundra Dubin.  The patient has chronic persistent atrial fibrillation, progressive symptoms of diastolic heart failure, and severe mitral and tricuspid regurgitation.  He was recently referred by Dr. Acie Fredrickson to see Dr. Aundra Dubin in the advanced heart failure clinic.  The patient has developed progressive leg swelling and exertional dyspnea.  He has been functionally independent and in fact he has been quite active over the years, even playing tennis recently.  The patient was ultimately referred for transesophageal echo and cardiac catheterization to better evaluate his heart failure and valvular heart disease.  TEE showed an LVEF of 55 to 60%.  The RV is enlarged but demonstrated normal systolic function.  There is biatrial enlargement with moderate LA enlargement and severe RA enlargement.  There was very severe tricuspid regurgitation with a peak RV/RA gradient of 45 mmHg.  There is no significant aortic valve disease.  There was demonstration of severe eccentric mitral regurgitation with prolapse and partial flail of the P3 segment of the posterior mitral valve leaflet.  The vena contractor 0.53 cm.  Pulmonary vein flow was blunted but not reversed.  There is no mitral stenosis with a mean gradient of 2 mmHg.  The patient is here with his significant other today. He has been seeing Dr Acie Fredrickson since 2017 when he was diagnosed with atrial fibrillation. He remembers having a heart murmur for many years. He has been asymptomatic until recently and has tolerated anticoagulation with eliquis. He has had intermittent exertional dyspnea, exacerbated  periodically by anemia. He currently is doing better with minimal symptoms playing doubles tennis or walking up to one mile. He recently did some hiking in the mountains and had to stop and rest with this because of shortness of breath. He has also had more problems with leg swelling and even weeping fluid from the legs. He hasn't had chest pain or pressure. No orthopnea or PND.   The patient has had regular dental care and reports no problems.  Past Medical History:  Diagnosis Date   Blood transfusion without reported diagnosis    Cataract    removed in replace bilateral   GERD (gastroesophageal reflux disease)    Hypertension    Iron deficiency anemia    amdx 08/2018 >> Tx with PRBCs and Feraheme started; no GI source per workup; followed by Heme (Dr. Jana Hakim) and GI (Dr. Cristina Gong)    Left renal mass    s/p L nephrectomy   Mitral regurgitation    Echo 02/2016:  Mild conc LVH, EF 55-60, mild to mod MR, mod LAE, mod to severe RAE, mod TR, PASP 41 // Echo 10/2018: EF 60-65, mod conc LVH, severe BAE, trivial eff, mild MVP with mod MR, mod to severe TR, asc aorta 36 (mild dilation)    Persistent Atrial Fibrillation    Pulmonary hypertension ([place])    Echocardiogram 7/22: EF 60-65, no RWMA, normal RVSF, RVSP 70.7 (severe pulmonary hypertension), severe BAE, mod MR, severe TR, mild to mod AV sclerosis w/o AS    Current Outpatient Medications  Medication Sig Dispense Refill   ALPRAZolam (XANAX) 0.25 MG tablet Take  0.25 mg by mouth as needed for anxiety.   1   ALPRAZolam (XANAX) 0.25 MG tablet Take 1 tablet (0.25 mg total) by mouth every 6 (six) hours as needed for anxiety 30 tablet 0   apixaban (ELIQUIS) 5 MG TABS tablet TAKE 1 TABLET BY MOUTH TWICE DAILY 180 tablet 3   Apoaequorin (PREVAGEN EXTRA STRENGTH PO) Take 1 tablet by mouth daily. Takes once daily     Barberry-Oreg Grape-Goldenseal (BERBERINE COMPLEX PO) Take 600 mg by mouth daily. Takes 2 capsule once daily     Chromium 400 MCG TABS  Take 400 mcg by mouth daily.     ciclopirox (PENLAC) 8 % solution Apply 1 application topically at bedtime as needed (toe nail fungus).     Coenzyme Q10 (CO Q10) 200 MG CAPS Take 200 mg by mouth daily. Takes one capsule once daily     empagliflozin (JARDIANCE) 10 MG TABS tablet Take 1 tablet (10 mg total) by mouth daily before breakfast. 30 tablet 6   ferrous gluconate (FERGON) 324 MG tablet Take 324 mg by mouth daily with breakfast.     furosemide (LASIX) 40 MG tablet Take 1 tablet (40 mg total) by mouth daily. 30 tablet 3   glucosamine-chondroitin 500-400 MG tablet Take 1 tablet by mouth 2 (two) times daily.      KRILL OIL PO Take 1 capsule by mouth daily at 12 noon.     Linoleic Acid Conjugated 1000 MG CAPS Take 1 capsule by mouth in the morning and at bedtime. Takes one capsule twice daily     losartan (COZAAR) 50 MG tablet TAKE 1 TABLET BY MOUTH EVERY DAY 90 tablet 1   Magnesium Glycinate 665 MG CAPS Take 1 capsule by mouth in the morning and at bedtime. Takes one capsule twice daily     Melatonin 5 MG TABS Take 5 mg by mouth at bedtime. Takes one tablet at bedtime     metoprolol succinate (TOPROL-XL) 50 MG 24 hr tablet TAKE 1 TABLET (50 MG TOTAL) BY MOUTH 2 (TWO) TIMES DAILY. TAKE WITH OR IMMEDIATELY FOLLOWING A MEAL. 180 tablet 1   Multiple Vitamin (MULTIVITAMIN) capsule Take 2 capsules by mouth daily. Takes two tablets once daily     NON FORMULARY Take 1 capsule by mouth 2 (two) times daily. TRU vitamin     omeprazole (PRILOSEC) 20 MG capsule Take 20 mg by mouth daily.     OVER THE COUNTER MEDICATION Take 2 capsules by mouth daily. [facility]     OVER THE COUNTER MEDICATION Take 2 capsules by mouth daily. [employer]     potassium chloride SA (KLOR-CON) 20 MEQ tablet Take 2 tablets (40 mEq total) by mouth daily. 60 tablet 3   tadalafil (CIALIS) 20 MG tablet TAKE ONE TABLET BY MOUTH DAILY AS NEEDED 30 tablet 11   No current facility-administered medications for this visit.    ALLERGIES:    Patient has no known allergies.   SOCIAL HISTORY:  The patient  reports that he has never smoked. He has never used smokeless tobacco. He reports current alcohol use. He reports that he does not use drugs.   FAMILY HISTORY:  The patient's family history includes Breast cancer in his mother; Hypertension in his father.   REVIEW OF SYSTEMS:  Positive for bilateral knee pain.   All other systems are reviewed and negative.   PHYSICAL EXAM: VS:  BP 120/70   Pulse 64   Ht '5\' 11"'$  (1.803 m)   Wt 164 lb (  74.4 kg)   SpO2 96%   BMI 22.87 kg/m  , BMI Body mass index is 22.87 kg/m. GEN: Well nourished, well developed, in no acute distress HEENT: normal Neck: No JVD. carotids 2+ without bruits or masses Cardiac: The heart is irregularly irregular with a 3/6 holosystolic murmur at the LV apex. No edema. Pedal pulses 2+ = bilaterally  Respiratory:  clear to auscultation bilaterally GI: soft, nontender, nondistended, + BS MS: no deformity or atrophy Skin: warm and dry, no rash Neuro:  Strength and sensation are intact Psych: euthymic mood, full affect  EKG:  EKG from 01/21/21 reviewed and demonstrates atrial fibrillation with slow ventricular response, HR 57 bpm.   RECENT LABS: 11/12/2020: ALT 18 11/26/2020: NT-Pro BNP 1,404; TSH 2.950 01/08/2021: Platelets 126 01/21/2021: B Natriuretic Peptide 409.0; BUN 21; Creatinine, Ser 1.05 01/27/2021: Hemoglobin 12.6; Hemoglobin 12.6; Potassium 4.2; Potassium 4.0; Sodium 140; Sodium 141  No results found for requested labs within last 8760 hours.   Estimated Creatinine Clearance: 52.2 mL/min (by C-G formula based on SCr of 1.05 mg/dL).   Wt Readings from Last 3 Encounters:  01/29/21 164 lb (74.4 kg)  01/27/21 156 lb (70.8 kg)  01/21/21 166 lb 9.6 oz (75.6 kg)     CARDIAC STUDIES: TEE:   1. Left ventricular ejection fraction, by estimation, is 55 to 60%. The  left ventricle has normal function. The left ventricle has no regional  wall motion  abnormalities.   2. Right ventricular systolic function is normal. The right ventricular  size is moderately enlarged.   3. Left atrial size was moderately dilated. No left atrial/left atrial  appendage thrombus was detected.   4. Right atrial size was severely dilated.   5. There was severe tricuspid regurgitation, suspect functional due to  annular dilatation. Peak RV-RA gradient 45 mmHg.   6. No PFO or ASD by color doppler.   7. The aortic valve is tricuspid. Aortic valve regurgitation is not  visualized. No aortic stenosis is present.   8. There was severe, highly eccentric, anteriorly-directed mitral  regurgitation. There was prolapse and partial flail of the P3 segment of  the posterior mitral valve. Vena contracta area 0.53 cm^2. Unable to  measure PISA due to highly eccentric jet. The   pulmonary veins on right and left showed systolic flattening but not  definite flow reversal on the pulmonary vein doppler interrogation. No  evidence of mitral stenosis. The mean mitral valve gradient is 2.0 mmHg.   9. Normal caliber aorta with grade 3 plaque descending thoracic aorta.  10. Severe TR and MR. TR appears functional. The MR appears to be due to  leaflet abnormality primarily with P3 segment prolapse/partial flail.   Cardiac Cath: Conclusion      Prox LAD lesion is 25% stenosed.   1. Minimal CAD.  2. Borderline elevated PCWP with prominent V-waves suggestive of significant mitral regurgitation.  3. Mild-moderate pulmonary venous hypertension.  4. Preserved cardiac output.    Will refer to structural heart clinic to consider Mitraclip.  Right Heart Pressures RHC Procedural Findings: Hemodynamics (mmHg) RA mean 6 with V-waves to 10 RV 42/5 PA 47/15, mean 31 PCWP mean 15, prominent V-waves to 32 LV 104/14 AO 107/57  Oxygen saturations: PA 75% AO 98%  Cardiac Output (Fick) 6.31  Cardiac Index (Fick) 3.32 PVR 2.5 WU   STS RISK CALCULATOR: Mitral Valve  Replacement: Risk of Mortality: 8.237% Renal Failure: 3.973% Permanent Stroke: 1.226% Prolonged Ventilation: 16.948% DSW Infection: 0.073% Reoperation: 9.674% Morbidity or  Mortality: 25.833% Short Length of Stay: 12.726% Long Length of Stay: 14.764%  Mitral Valve Repair: Risk of Mortality: 6.748% Renal Failure: 4.077% Permanent Stroke: 1.886% Prolonged Ventilation: 13.706% DSW Infection: 0.038% Reoperation: 6.426% Morbidity or Mortality: 21.099% Short Length of Stay: 19.229% Long Length of Stay: 10.954%   ASSESSMENT AND PLAN: 1.  The patient has severe, stage D, mitral and tricuspid regurgitation with progressive symptoms of acute on chronic diastolic heart failure, New York Heart Association functional class III limitation.  The patient's cardiac catheterization, echocardiogram, and transesophageal echocardiogram have all been reviewed.  Transesophageal echo demonstrates Carpentier type II dysfunction with prolapse and partial flail of P2 and P3.  Both the echo and cardiac catheterization data support severe 4+ mitral regurgitation with an highly eccentric jet of mitral regurgitation.  Cardiac catheterization also is notable for an absence of significant coronary artery disease.  I have reviewed the natural history of mitral regurgitation with the patient and their family members who are present today. We have discussed the limitations of medical therapy and the poor prognosis associated with symptomatic mitral regurgitation. We have also reviewed potential treatment options, including palliative medical therapy, conventional surgical mitral valve repair or replacement, and percutaneous mitral valve therapies such as edge-to-edge mitral valve approximation with MitraClip. We discussed treatment options in the context of this patient's specific comorbid medical conditions.  Considering his advanced age and comorbidities, I think transcatheter edge-to-edge mitral valve  repair is certainly a reasonable consideration.  I do not think he would be a candidate for conventional heart surgery.  P3 pathology can be more technically challenging because of interaction with the chordal apparatus, but I think edge-to-edge repair is technically feasible in this case based on review of his TEE.  The patient is counseled specifically about the risks, indications, and alternatives to percutaneous mitral valve repair with MitraClip. Specific risks include vascular injury, bleeding, infection, arrhythmia, myocardial infarction, stroke, cardiac perforation, cardiac tamponade, device embolization, single leaflet detachment, endocarditis, mitral valve injury, emergency surgery, and death. They understand the risk of serious complication occurs at a rate of approximately 2%. After this discussion, the patient provides full informed consent for surgery.    As a next step in his evaluation, he will be referred for formal cardiac surgical consultation as part of a multidisciplinary approach to his care. If he is treated with MitraClip, his TR would be treated medically and if refractory symptoms occur he could be considered for tricuspid valve transcatheter treatments.  Deatra Baily 01/29/2021 1:06 PM     [place] [address]  [phone] (office) [phone] (fax)

## 2021-01-29 NOTE — Patient Instructions (Addendum)

## 2021-01-29 NOTE — Progress Notes (Addendum)
John Holder DOB 02/25/1932  This looks like a clippable valve. The fossa looks approachable for transseptal puncture in both the Bicaval and SAB views. TR is noted. LA dimensions are large enough for device steering and straddle. The MR jet originates from a P3 prolapse and the MR jet is directed laterally across the valve. The posterior leaflet is measures 1.26 cm in the 126 LVOT grasping view. MVA measures 7.45 cm2 and gradient measures 2 mmHg. Based on this information, I'd like to start with an NTW and assess for gradient.

## 2021-01-29 NOTE — Progress Notes (Signed)
[facility]   MULTIDISCIPLINARY HEART VALVE TEAM  Date:  01/29/2021   ID:  John Holder, DOB [birth year], MRN [id number]  PCP:  Burnard Bunting, MD   Chief Complaint  Patient presents with   Shortness of Breath      HISTORY OF PRESENT ILLNESS: John Holder is a 87 y.o. male who presents for evaluation of mitral regurgitation, referred by Dr Aundra Dubin.  The patient has chronic persistent atrial fibrillation, progressive symptoms of diastolic heart failure, and severe mitral and tricuspid regurgitation.  He was recently referred by Dr. Acie Fredrickson to see Dr. Aundra Dubin in the advanced heart failure clinic.  The patient has developed progressive leg swelling and exertional dyspnea.  He has been functionally independent and in fact he has been quite active over the years, even playing tennis recently.  The patient was ultimately referred for transesophageal echo and cardiac catheterization to better evaluate his heart failure and valvular heart disease.  TEE showed an LVEF of 55 to 60%.  The RV is enlarged but demonstrated normal systolic function.  There is biatrial enlargement with moderate LA enlargement and severe RA enlargement.  There was very severe tricuspid regurgitation with a peak RV/RA gradient of 45 mmHg.  There is no significant aortic valve disease.  There was demonstration of severe eccentric mitral regurgitation with prolapse and partial flail of the P3 segment of the posterior mitral valve leaflet.  The vena contractor 0.53 cm.  Pulmonary vein flow was blunted but not reversed.  There is no mitral stenosis with a mean gradient of 2 mmHg.  The patient is here with his significant other today. He has been seeing Dr Acie Fredrickson since 2017 when he was diagnosed with atrial fibrillation. He remembers having a heart murmur for many years. He has been asymptomatic until recently and has tolerated anticoagulation with eliquis. He has had intermittent exertional dyspnea, exacerbated  periodically by anemia. He currently is doing better with minimal symptoms playing doubles tennis or walking up to one mile. He recently did some hiking in the mountains and had to stop and rest with this because of shortness of breath. He has also had more problems with leg swelling and even weeping fluid from the legs. He hasn't had chest pain or pressure. No orthopnea or PND.   The patient has had regular dental care and reports no problems.  Past Medical History:  Diagnosis Date   Blood transfusion without reported diagnosis    Cataract    removed in replace bilateral   GERD (gastroesophageal reflux disease)    Hypertension    Iron deficiency anemia    amdx 08/2018 >> Tx with PRBCs and Feraheme started; no GI source per workup; followed by Heme (Dr. Jana Hakim) and GI (Dr. Cristina Gong)    Left renal mass    s/p L nephrectomy   Mitral regurgitation    Echo 02/2016:  Mild conc LVH, EF 55-60, mild to mod MR, mod LAE, mod to severe RAE, mod TR, PASP 41 // Echo 10/2018: EF 60-65, mod conc LVH, severe BAE, trivial eff, mild MVP with mod MR, mod to severe TR, asc aorta 36 (mild dilation)    Persistent Atrial Fibrillation    Pulmonary hypertension ([facility])    Echocardiogram 7/22: EF 60-65, no RWMA, normal RVSF, RVSP 70.7 (severe pulmonary hypertension), severe BAE, mod MR, severe TR, mild to mod AV sclerosis w/o AS    Current Outpatient Medications  Medication Sig Dispense Refill   ALPRAZolam (XANAX) 0.25 MG tablet Take  0.25 mg by mouth as needed for anxiety.   1   ALPRAZolam (XANAX) 0.25 MG tablet Take 1 tablet (0.25 mg total) by mouth every 6 (six) hours as needed for anxiety 30 tablet 0   apixaban (ELIQUIS) 5 MG TABS tablet TAKE 1 TABLET BY MOUTH TWICE DAILY 180 tablet 3   Apoaequorin (PREVAGEN EXTRA STRENGTH PO) Take 1 tablet by mouth daily. Takes once daily     Barberry-Oreg Grape-Goldenseal (BERBERINE COMPLEX PO) Take 600 mg by mouth daily. Takes 2 capsule once daily     Chromium 400 MCG TABS  Take 400 mcg by mouth daily.     ciclopirox (PENLAC) 8 % solution Apply 1 application topically at bedtime as needed (toe nail fungus).     Coenzyme Q10 (CO Q10) 200 MG CAPS Take 200 mg by mouth daily. Takes one capsule once daily     empagliflozin (JARDIANCE) 10 MG TABS tablet Take 1 tablet (10 mg total) by mouth daily before breakfast. 30 tablet 6   ferrous gluconate (FERGON) 324 MG tablet Take 324 mg by mouth daily with breakfast.     furosemide (LASIX) 40 MG tablet Take 1 tablet (40 mg total) by mouth daily. 30 tablet 3   glucosamine-chondroitin 500-400 MG tablet Take 1 tablet by mouth 2 (two) times daily.      KRILL OIL PO Take 1 capsule by mouth daily at 12 noon.     Linoleic Acid Conjugated 1000 MG CAPS Take 1 capsule by mouth in the morning and at bedtime. Takes one capsule twice daily     losartan (COZAAR) 50 MG tablet TAKE 1 TABLET BY MOUTH EVERY DAY 90 tablet 1   Magnesium Glycinate 665 MG CAPS Take 1 capsule by mouth in the morning and at bedtime. Takes one capsule twice daily     Melatonin 5 MG TABS Take 5 mg by mouth at bedtime. Takes one tablet at bedtime     metoprolol succinate (TOPROL-XL) 50 MG 24 hr tablet TAKE 1 TABLET (50 MG TOTAL) BY MOUTH 2 (TWO) TIMES DAILY. TAKE WITH OR IMMEDIATELY FOLLOWING A MEAL. 180 tablet 1   Multiple Vitamin (MULTIVITAMIN) capsule Take 2 capsules by mouth daily. Takes two tablets once daily     NON FORMULARY Take 1 capsule by mouth 2 (two) times daily. TRU vitamin     omeprazole (PRILOSEC) 20 MG capsule Take 20 mg by mouth daily.     OVER THE COUNTER MEDICATION Take 2 capsules by mouth daily. [facility]     OVER THE COUNTER MEDICATION Take 2 capsules by mouth daily. [facility]     potassium chloride SA (KLOR-CON) 20 MEQ tablet Take 2 tablets (40 mEq total) by mouth daily. 60 tablet 3   tadalafil (CIALIS) 20 MG tablet TAKE ONE TABLET BY MOUTH DAILY AS NEEDED 30 tablet 11   No current facility-administered medications for this visit.    ALLERGIES:    Patient has no known allergies.   SOCIAL HISTORY:  The patient  reports that he has never smoked. He has never used smokeless tobacco. He reports current alcohol use. He reports that he does not use drugs.   FAMILY HISTORY:  The patient's family history includes Breast cancer in his mother; Hypertension in his father.   REVIEW OF SYSTEMS:  Positive for bilateral knee pain.   All other systems are reviewed and negative.   PHYSICAL EXAM: VS:  BP 120/70   Pulse 64   Ht '5\' 11"'$  (1.803 m)   Wt 164 lb (  74.4 kg)   SpO2 96%   BMI 22.87 kg/m  , BMI Body mass index is 22.87 kg/m. GEN: Well nourished, well developed, in no acute distress HEENT: normal Neck: No JVD. carotids 2+ without bruits or masses Cardiac: The heart is irregularly irregular with a 3/6 holosystolic murmur at the LV apex. No edema. Pedal pulses 2+ = bilaterally  Respiratory:  clear to auscultation bilaterally GI: soft, nontender, nondistended, + BS MS: no deformity or atrophy Skin: warm and dry, no rash Neuro:  Strength and sensation are intact Psych: euthymic mood, full affect  EKG:  EKG from 01/21/21 reviewed and demonstrates atrial fibrillation with slow ventricular response, HR 57 bpm.   RECENT LABS: 11/12/2020: ALT 18 11/26/2020: NT-Pro BNP 1,404; TSH 2.950 01/08/2021: Platelets 126 01/21/2021: B Natriuretic Peptide 409.0; BUN 21; Creatinine, Ser 1.05 01/27/2021: Hemoglobin 12.6; Hemoglobin 12.6; Potassium 4.2; Potassium 4.0; Sodium 140; Sodium 141  No results found for requested labs within last 8760 hours.   Estimated Creatinine Clearance: 52.2 mL/min (by C-G formula based on SCr of 1.05 mg/dL).   Wt Readings from Last 3 Encounters:  01/29/21 164 lb (74.4 kg)  01/27/21 156 lb (70.8 kg)  01/21/21 166 lb 9.6 oz (75.6 kg)     CARDIAC STUDIES: TEE:   1. Left ventricular ejection fraction, by estimation, is 55 to 60%. The  left ventricle has normal function. The left ventricle has no regional  wall motion  abnormalities.   2. Right ventricular systolic function is normal. The right ventricular  size is moderately enlarged.   3. Left atrial size was moderately dilated. No left atrial/left atrial  appendage thrombus was detected.   4. Right atrial size was severely dilated.   5. There was severe tricuspid regurgitation, suspect functional due to  annular dilatation. Peak RV-RA gradient 45 mmHg.   6. No PFO or ASD by color doppler.   7. The aortic valve is tricuspid. Aortic valve regurgitation is not  visualized. No aortic stenosis is present.   8. There was severe, highly eccentric, anteriorly-directed mitral  regurgitation. There was prolapse and partial flail of the P3 segment of  the posterior mitral valve. Vena contracta area 0.53 cm^2. Unable to  measure PISA due to highly eccentric jet. The   pulmonary veins on right and left showed systolic flattening but not  definite flow reversal on the pulmonary vein doppler interrogation. No  evidence of mitral stenosis. The mean mitral valve gradient is 2.0 mmHg.   9. Normal caliber aorta with grade 3 plaque descending thoracic aorta.  10. Severe TR and MR. TR appears functional. The MR appears to be due to  leaflet abnormality primarily with P3 segment prolapse/partial flail.   Cardiac Cath: Conclusion      Prox LAD lesion is 25% stenosed.   1. Minimal CAD.  2. Borderline elevated PCWP with prominent V-waves suggestive of significant mitral regurgitation.  3. Mild-moderate pulmonary venous hypertension.  4. Preserved cardiac output.    Will refer to structural heart clinic to consider Mitraclip.  Right Heart Pressures RHC Procedural Findings: Hemodynamics (mmHg) RA mean 6 with V-waves to 10 RV 42/5 PA 47/15, mean 31 PCWP mean 15, prominent V-waves to 32 LV 104/14 AO 107/57  Oxygen saturations: PA 75% AO 98%  Cardiac Output (Fick) 6.31  Cardiac Index (Fick) 3.32 PVR 2.5 WU   STS RISK CALCULATOR: Mitral Valve  Replacement: Risk of Mortality: 8.237% Renal Failure: 3.973% Permanent Stroke: 1.226% Prolonged Ventilation: 16.948% DSW Infection: 0.073% Reoperation: 9.674% Morbidity or  Mortality: 25.833% Short Length of Stay: 12.726% Long Length of Stay: 14.764%  Mitral Valve Repair: Risk of Mortality: 6.748% Renal Failure: 4.077% Permanent Stroke: 1.886% Prolonged Ventilation: 13.706% DSW Infection: 0.038% Reoperation: 6.426% Morbidity or Mortality: 21.099% Short Length of Stay: 19.229% Long Length of Stay: 10.954%   ASSESSMENT AND PLAN: 1.  The patient has severe, stage D, mitral and tricuspid regurgitation with progressive symptoms of acute on chronic diastolic heart failure, New York Heart Association functional class III limitation.  The patient's cardiac catheterization, echocardiogram, and transesophageal echocardiogram have all been reviewed.  Transesophageal echo demonstrates Carpentier type II dysfunction with prolapse and partial flail of P2 and P3.  Both the echo and cardiac catheterization data support severe 4+ mitral regurgitation with an highly eccentric jet of mitral regurgitation.  Cardiac catheterization also is notable for an absence of significant coronary artery disease.  I have reviewed the natural history of mitral regurgitation with the patient and their family members who are present today. We have discussed the limitations of medical therapy and the poor prognosis associated with symptomatic mitral regurgitation. We have also reviewed potential treatment options, including palliative medical therapy, conventional surgical mitral valve repair or replacement, and percutaneous mitral valve therapies such as edge-to-edge mitral valve approximation with MitraClip. We discussed treatment options in the context of this patient's specific comorbid medical conditions.  Considering his advanced age and comorbidities, I think transcatheter edge-to-edge mitral valve  repair is certainly a reasonable consideration.  I do not think he would be a candidate for conventional heart surgery.  P3 pathology can be more technically challenging because of interaction with the chordal apparatus, but I think edge-to-edge repair is technically feasible in this case based on review of his TEE.  The patient is counseled specifically about the risks, indications, and alternatives to percutaneous mitral valve repair with MitraClip. Specific risks include vascular injury, bleeding, infection, arrhythmia, myocardial infarction, stroke, cardiac perforation, cardiac tamponade, device embolization, single leaflet detachment, endocarditis, mitral valve injury, emergency surgery, and death. They understand the risk of serious complication occurs at a rate of approximately 2%. After this discussion, the patient provides full informed consent for surgery.    As a next step in his evaluation, he will be referred for formal cardiac surgical consultation as part of a multidisciplinary approach to his care. If he is treated with MitraClip, his TR would be treated medically and if refractory symptoms occur he could be considered for tricuspid valve transcatheter treatments.  Deatra Flynt 01/29/2021 1:06 PM     [place] [address]  [phone] (office) [phone] (fax)

## 2021-01-30 ENCOUNTER — Institutional Professional Consult (permissible substitution): Payer: Medicare PPO | Admitting: Thoracic Surgery (Cardiothoracic Vascular Surgery)

## 2021-01-30 VITALS — BP 140/85 | HR 82 | Resp 20 | Ht 71.0 in | Wt 164.0 lb

## 2021-01-30 DIAGNOSIS — I340 Nonrheumatic mitral (valve) insufficiency: Secondary | ICD-10-CM | POA: Diagnosis not present

## 2021-01-30 NOTE — Progress Notes (Signed)
[address]       [place] [address]             [phone]        John Holder [facility] Medical Record [id number] Date of Birth: 1931/11/17  Referring: Burnard Bunting, MD Primary Care: Burnard Bunting, MD Primary Cardiologist:Philip Nahser, MD  Chief Complaint:    Chief Complaint  Patient presents with   Mitral Regurgitation    Surgical consult, TEE and Cardiac Cath 01/27/21, PFT's 12/29/20    History of Present Illness:     This is an 87 year old gentleman that is referred for surgical evaluation of severe mitral valve regurgitation.  He has a known history of mitral valve regurgitation but recently was admitted to the hospital with worsening heart failure symptoms.  He underwent a transesophageal echocardiogram which did show severe MR as well as severe TR.  His medications were optimized and his symptoms improved greatly.  He remains able to play tennis and walk his dog on this new medication regimen.   Past Medical and Surgical History: Previous Chest Surgery: No Previous Chest Radiation: No  Past Medical History:  Diagnosis Date   Blood transfusion without reported diagnosis    Cataract    removed in replace bilateral   GERD (gastroesophageal reflux disease)    Hypertension    Iron deficiency anemia    amdx 08/2018 >> Tx with PRBCs and Feraheme started; no GI source per workup; followed by Heme (Dr. Jana Hakim) and GI (Dr. Cristina Gong)    Left renal mass    s/p L nephrectomy   Mitral regurgitation    Echo 02/2016:  Mild conc LVH, EF 55-60, mild to mod MR, mod LAE, mod to severe RAE, mod TR, PASP 41 // Echo 10/2018: EF 60-65, mod conc LVH, severe BAE, trivial eff, mild MVP with mod MR, mod to severe TR, asc aorta 36 (mild dilation)    Persistent Atrial Fibrillation    Pulmonary hypertension ([facility])    Echocardiogram 7/22: EF 60-65, no RWMA, normal RVSF, RVSP 70.7 (severe pulmonary hypertension), severe BAE, mod MR, severe TR, mild to mod AV  sclerosis w/o AS    Past Surgical History:  Procedure Laterality Date   CARDIOVERSION N/A 02/04/2016   Procedure: CARDIOVERSION;  Surgeon: Thayer Headings, MD;  Location: [facility];  Service: Cardiovascular;  Laterality: N/A;   COLONOSCOPY     COLONOSCOPY WITH PROPOFOL N/A 09/10/2018   Procedure: COLONOSCOPY WITH PROPOFOL;  Surgeon: Ronald Lobo, MD;  Location: [facility];  Service: Endoscopy;  Laterality: N/A;   ESOPHAGOGASTRODUODENOSCOPY (EGD) WITH PROPOFOL N/A 09/10/2018   Procedure: ESOPHAGOGASTRODUODENOSCOPY (EGD) WITH PROPOFOL;  Surgeon: Ronald Lobo, MD;  Location: [facility];  Service: Endoscopy;  Laterality: N/A;   GIVENS CAPSULE STUDY N/A 09/10/2018   Procedure: GIVENS CAPSULE STUDY;  Surgeon: Ronald Lobo, MD;  Location: [facility];  Service: Endoscopy;  Laterality: N/A;   RIGHT/LEFT HEART CATH AND CORONARY ANGIOGRAPHY N/A 01/27/2021   Procedure: RIGHT/LEFT HEART CATH AND CORONARY ANGIOGRAPHY;  Surgeon: Larey Dresser, MD;  Location: [facility];  Service: Cardiovascular;  Laterality: N/A;   TEE WITHOUT CARDIOVERSION N/A 01/27/2021   Procedure: TRANSESOPHAGEAL ECHOCARDIOGRAM (TEE);  Surgeon: Larey Dresser, MD;  Location: [facility];  Service: Cardiovascular;  Laterality: N/A;     Social History   Tobacco Use  Smoking Status Never  Smokeless Tobacco Never    Social History   Substance and Sexual Activity  Alcohol Use Yes  Alcohol/week: 0.0 standard drinks   Comment: occasional     No Known Allergies   Current Outpatient Medications  Medication Sig Dispense Refill   ALPRAZolam (XANAX) 0.25 MG tablet Take 0.25 mg by mouth as needed for anxiety.   1   ALPRAZolam (XANAX) 0.25 MG tablet Take 1 tablet (0.25 mg total) by mouth every 6 (six) hours as needed for anxiety 30 tablet 0   apixaban (ELIQUIS) 5 MG TABS tablet TAKE 1 TABLET BY MOUTH TWICE DAILY 180 tablet 3   Apoaequorin (PREVAGEN EXTRA STRENGTH PO) Take 1 tablet by mouth daily. Takes  once daily     Barberry-Oreg Grape-Goldenseal (BERBERINE COMPLEX PO) Take 600 mg by mouth daily. Takes 2 capsule once daily     Chromium 400 MCG TABS Take 400 mcg by mouth daily.     ciclopirox (PENLAC) 8 % solution Apply 1 application topically at bedtime as needed (toe nail fungus).     Coenzyme Q10 (CO Q10) 200 MG CAPS Take 200 mg by mouth daily. Takes one capsule once daily     empagliflozin (JARDIANCE) 10 MG TABS tablet Take 1 tablet (10 mg total) by mouth daily before breakfast. 30 tablet 6   ferrous gluconate (FERGON) 324 MG tablet Take 324 mg by mouth daily with breakfast.     furosemide (LASIX) 40 MG tablet Take 1 tablet (40 mg total) by mouth daily. 30 tablet 3   glucosamine-chondroitin 500-400 MG tablet Take 1 tablet by mouth 2 (two) times daily.      KRILL OIL PO Take 1 capsule by mouth daily at 12 noon.     Linoleic Acid Conjugated 1000 MG CAPS Take 1 capsule by mouth in the morning and at bedtime. Takes one capsule twice daily     losartan (COZAAR) 50 MG tablet TAKE 1 TABLET BY MOUTH EVERY DAY 90 tablet 1   Magnesium Glycinate 665 MG CAPS Take 1 capsule by mouth in the morning and at bedtime. Takes one capsule twice daily     Melatonin 5 MG TABS Take 5 mg by mouth at bedtime. Takes one tablet at bedtime     metoprolol succinate (TOPROL-XL) 50 MG 24 hr tablet TAKE 1 TABLET (50 MG TOTAL) BY MOUTH 2 (TWO) TIMES DAILY. TAKE WITH OR IMMEDIATELY FOLLOWING A MEAL. 180 tablet 1   Multiple Vitamin (MULTIVITAMIN) capsule Take 2 capsules by mouth daily. Takes two tablets once daily     NON FORMULARY Take 1 capsule by mouth 2 (two) times daily. TRU vitamin     omeprazole (PRILOSEC) 20 MG capsule Take 20 mg by mouth daily.     OVER THE COUNTER MEDICATION Take 2 capsules by mouth daily. [facility]     OVER THE COUNTER MEDICATION Take 2 capsules by mouth daily. [facility]     potassium chloride SA (KLOR-CON) 20 MEQ tablet Take 2 tablets (40 mEq total) by mouth daily. 60 tablet 3   tadalafil (CIALIS)  20 MG tablet TAKE ONE TABLET BY MOUTH DAILY AS NEEDED 30 tablet 11   No current facility-administered medications for this visit.    (Not in a hospital admission)   Family History  Problem Relation Age of Onset   Breast cancer Mother    Hypertension Father    Diabetes Neg Hx    Stroke Neg Hx    CAD Neg Hx    Colon cancer Neg Hx    Colon polyps Neg Hx    Esophageal cancer Neg Hx    Rectal cancer Neg Hx  Stomach cancer Neg Hx      Review of Systems:   Review of Systems  Constitutional:  Positive for malaise/fatigue.  Respiratory:  Positive for shortness of breath.   Cardiovascular:  Positive for palpitations and leg swelling. Negative for chest pain.     Physical Exam: BP 140/85   Pulse 82   Resp 20   Ht '5\' 11"'$  (1.803 m)   Wt 164 lb (74.4 kg)   SpO2 97% Comment: RA  BMI 22.87 kg/m  Physical Exam Constitutional:      General: He is not in acute distress.    Appearance: Normal appearance. He is normal weight. He is not ill-appearing or diaphoretic.     Comments: Appears younger than stated age  Eyes:     Extraocular Movements: Extraocular movements intact.  Cardiovascular:     Rate and Rhythm: Rhythm irregular.     Heart sounds: Murmur heard.  Pulmonary:     Effort: Pulmonary effort is normal. No respiratory distress.     Breath sounds: Normal breath sounds.  Musculoskeletal:     Cervical back: Normal range of motion.  Skin:    General: Skin is warm and dry.  Neurological:     General: No focal deficit present.     Mental Status: He is alert and oriented to person, place, and time.  Psychiatric:        Mood and Affect: Mood normal.        Behavior: Behavior normal.      Diagnostic Studies & Laboratory data:  Echo: 01/27/2021. IMPRESSIONS     1. Left ventricular ejection fraction, by estimation, is 55 to 60%. The  left ventricle has normal function. The left ventricle has no regional  wall motion abnormalities.   2. Right ventricular systolic  function is normal. The right ventricular  size is moderately enlarged.   3. Left atrial size was moderately dilated. No left atrial/left atrial  appendage thrombus was detected.   4. Right atrial size was severely dilated.   5. There was severe tricuspid regurgitation, suspect functional due to  annular dilatation. Peak RV-RA gradient 45 mmHg.   6. No PFO or ASD by color doppler.   7. The aortic valve is tricuspid. Aortic valve regurgitation is not  visualized. No aortic stenosis is present.   8. There was severe, highly eccentric, anteriorly-directed mitral  regurgitation. There was prolapse and partial flail of the P3 segment of  the posterior mitral valve. Vena contracta area 0.53 cm^2. Unable to  measure PISA due to highly eccentric jet. The   pulmonary veins on right and left showed systolic flattening but not  definite flow reversal on the pulmonary vein doppler interrogation. No  evidence of mitral stenosis. The mean mitral valve gradient is 2.0 mmHg.   9. Normal caliber aorta with grade 3 plaque descending thoracic aorta.  10. Severe TR and MR. TR appears functional. The MR appears to be due to  leaflet abnormality primarily with P3 segment prolapse/partial flail.    I have independently reviewed the above radiologic studies and discussed with the patient   Recent Lab Findings: Lab Results  Component Value Date   WBC 2.9 (L) 01/08/2021   HGB 12.6 (L) 01/27/2021   HGB 12.6 (L) 01/27/2021   HCT 37.0 (L) 01/27/2021   HCT 37.0 (L) 01/27/2021   PLT 126 (L) 01/08/2021   GLUCOSE 91 01/21/2021   TRIG 31 09/08/2018   ALT 18 11/12/2020   AST 32 11/12/2020   NA  140 01/27/2021   NA 141 01/27/2021   K 4.2 01/27/2021   K 4.0 01/27/2021   CL 100 01/21/2021   CREATININE 1.05 01/21/2021   BUN 21 01/21/2021   CO2 27 01/21/2021   TSH 2.950 11/26/2020   INR 1.4 (H) 10/22/2019      Assessment / Plan:   This is an 87 year old gentleman who presents with severe mitral valve  regurgitation, as well as severe tricuspid valve regurgitation.  He has no significant valvular disease.  As well as mild to moderate pulmonary hypertension.  He is quite functional and appears younger than 54.  Although he originally presented with heart failure symptoms now that his medications have been optimized, he is able to do most of his daily activities.  On review of the echocardiogram he does have some prolapse of the P3 segment of his mitral valve.  I find it difficult to justify performing mitral valve and tricuspid valve repair on this 87 year old gentleman.  I agree with Dr. Claude Manges s plan for MitraClip placement to treat his mitral regurgitation, and medical therapy for his tricuspid valve regurgitation.     I  spent 40 minutes counseling the patient face to face.   Lajuana Matte 01/30/2021 1:14 PM

## 2021-02-02 ENCOUNTER — Other Ambulatory Visit: Payer: Self-pay

## 2021-02-02 ENCOUNTER — Ambulatory Visit (HOSPITAL_COMMUNITY)
Admission: RE | Admit: 2021-02-02 | Discharge: 2021-02-02 | Disposition: A | Discharge status: Home / Self Care | Payer: Medicare PPO | Source: Ambulatory Visit | Attending: Internal Medicine | Admitting: Internal Medicine

## 2021-02-02 ENCOUNTER — Telehealth: Payer: Self-pay | Admitting: Cardiovascular Disease

## 2021-02-02 DIAGNOSIS — I5032 Chronic diastolic (congestive) heart failure: Secondary | ICD-10-CM | POA: Diagnosis not present

## 2021-02-02 DIAGNOSIS — I340 Nonrheumatic mitral (valve) insufficiency: Secondary | ICD-10-CM

## 2021-02-02 DIAGNOSIS — I272 Pulmonary hypertension, unspecified: Secondary | ICD-10-CM | POA: Diagnosis not present

## 2021-02-02 LAB — BASIC METABOLIC PANEL
Anion gap: 7 (ref 5–15)
BUN: 25 mg/dL — ABNORMAL HIGH (ref 8–23)
CO2: 27 mmol/L (ref 22–32)
Calcium: 9.2 mg/dL (ref 8.9–10.3)
Chloride: 100 mmol/L (ref 98–111)
Creatinine, Ser: 1.06 mg/dL (ref 0.61–1.24)
GFR, Estimated: >60 mL/min (ref >60)
Glucose, Bld: 102 mg/dL — ABNORMAL HIGH (ref 70–99)
Potassium: 4.5 mmol/L (ref 3.5–5.1)
Sodium: 134 mmol/L — ABNORMAL LOW (ref 135–145)

## 2021-02-02 NOTE — Telephone Encounter (Signed)
Patient is requesting to schedule a procedure with Dr. Burt Knack.  He states this was discussed during his visit on 09/15.

## 2021-02-02 NOTE — Progress Notes (Deleted)
   TAVR Instructions ***: You are scheduled for TAVR on Tuesday, *** at ***.  Please arrive in Admitting at [facility] (Main Entrance A) at ***AM for check-in.  Have nothing to eat or drink after midnight the night before surgery.  Stop taking Eliquis on 2/8. You will take your last dose on 2/7 (Thursday).  Continue taking all other medications without change through the day before surgery.  On the morning of surgery take only Synthroid with a sip of water.  On the morning of surgery do not take any medications.  If you have any questions or concerns, please do not hesitate to call.  Sincerely,  Lenice Llamas, RN [phone]

## 2021-02-02 NOTE — Telephone Encounter (Signed)
Spoke with the patient and scheduled him for MitraClip 02/26/2021. He is in the car now. He will review instructions sent via MyChart message and understands he will be called tomorrow to review. He was grateful for assistance.

## 2021-02-03 ENCOUNTER — Encounter (HOSPITAL_COMMUNITY): Payer: Medicare PPO | Admitting: Cardiology

## 2021-02-03 NOTE — Telephone Encounter (Signed)
Reviewed upcoming appointments and MitraClip instructions with patient. He has no questions. He was grateful for call and agrees with plan.

## 2021-02-18 ENCOUNTER — Ambulatory Visit (HOSPITAL_COMMUNITY)
Admission: RE | Admit: 2021-02-18 | Discharge: 2021-02-18 | Disposition: A | Discharge status: Home / Self Care | Payer: Medicare PPO | Source: Ambulatory Visit | Attending: Cardiology | Admitting: Cardiology

## 2021-02-18 ENCOUNTER — Other Ambulatory Visit: Payer: Self-pay

## 2021-02-18 ENCOUNTER — Encounter (HOSPITAL_COMMUNITY): Payer: Self-pay | Admitting: Cardiology

## 2021-02-18 ENCOUNTER — Encounter (HOSPITAL_COMMUNITY): Payer: Medicare PPO | Admitting: Cardiology

## 2021-02-18 VITALS — BP 110/70 | HR 61 | Wt 164.4 lb

## 2021-02-18 DIAGNOSIS — Z8249 Family history of ischemic heart disease and other diseases of the circulatory system: Secondary | ICD-10-CM | POA: Diagnosis not present

## 2021-02-18 DIAGNOSIS — I5032 Chronic diastolic (congestive) heart failure: Secondary | ICD-10-CM | POA: Diagnosis not present

## 2021-02-18 DIAGNOSIS — Z7901 Long term (current) use of anticoagulants: Secondary | ICD-10-CM | POA: Insufficient documentation

## 2021-02-18 DIAGNOSIS — I340 Nonrheumatic mitral (valve) insufficiency: Secondary | ICD-10-CM | POA: Diagnosis not present

## 2021-02-18 DIAGNOSIS — I2510 Atherosclerotic heart disease of native coronary artery without angina pectoris: Secondary | ICD-10-CM | POA: Diagnosis not present

## 2021-02-18 DIAGNOSIS — I272 Pulmonary hypertension, unspecified: Secondary | ICD-10-CM | POA: Insufficient documentation

## 2021-02-18 DIAGNOSIS — Z905 Acquired absence of kidney: Secondary | ICD-10-CM | POA: Insufficient documentation

## 2021-02-18 DIAGNOSIS — Z7984 Long term (current) use of oral hypoglycemic drugs: Secondary | ICD-10-CM | POA: Insufficient documentation

## 2021-02-18 DIAGNOSIS — I482 Chronic atrial fibrillation, unspecified: Secondary | ICD-10-CM | POA: Diagnosis not present

## 2021-02-18 DIAGNOSIS — I081 Rheumatic disorders of both mitral and tricuspid valves: Secondary | ICD-10-CM | POA: Insufficient documentation

## 2021-02-18 DIAGNOSIS — I38 Endocarditis, valve unspecified: Secondary | ICD-10-CM | POA: Diagnosis not present

## 2021-02-18 LAB — BASIC METABOLIC PANEL WITH GFR
Anion gap: 7 (ref 5–15)
BUN: 20 mg/dL (ref 8–23)
CO2: 28 mmol/L (ref 22–32)
Calcium: 9.5 mg/dL (ref 8.9–10.3)
Chloride: 98 mmol/L (ref 98–111)
Creatinine, Ser: 1.05 mg/dL (ref 0.61–1.24)
GFR, Estimated: >60 mL/min (ref >60)
Glucose, Bld: 105 mg/dL — ABNORMAL HIGH (ref 70–99)
Potassium: 4.3 mmol/L (ref 3.5–5.1)
Sodium: 133 mmol/L — ABNORMAL LOW (ref 135–145)

## 2021-02-18 LAB — BRAIN NATRIURETIC PEPTIDE: B Natriuretic Peptide: 381 pg/mL — ABNORMAL HIGH (ref 0.0–100.0)

## 2021-02-18 NOTE — Patient Instructions (Signed)
Labs done today. We will contact you only if your labs are abnormal.  No medication changes were made. Please continue all current medications as prescribed.  Your physician recommends that you schedule a follow-up appointment in: 2 months  If you have any questions or concerns before your next appointment please send [place] a message through [place] or call our office at [phone].    TO LEAVE A MESSAGE FOR THE NURSE SELECT OPTION 2, PLEASE LEAVE A MESSAGE INCLUDING: YOUR NAME DATE OF BIRTH CALL BACK NUMBER REASON FOR CALL**this is important as we prioritize the call backs  YOU WILL RECEIVE A CALL BACK THE SAME DAY AS LONG AS YOU CALL BEFORE 4:00 PM   Do the following things EVERYDAY: Weigh yourself in the morning before breakfast. Write it down and keep it in a log. Take your medicines as prescribed Eat low salt foods--Limit salt (sodium) to 2000 mg per day.  Stay as active as you can everyday Limit all fluids for the day to less than 2 liters   At the [facility], you and your health needs are our priority. As part of our continuing mission to provide you with exceptional heart care, we have created designated Provider Care Teams. These Care Teams include your primary Cardiologist (physician) and Advanced Practice Providers (APPs- Physician Assistants and Nurse Practitioners) who all work together to provide you with the care you need, when you need it.   You may see any of the following providers on your designated Care Team at your next follow up: Dr Glori Bickers Dr Haynes Kerns, NP Lyda Jester, [place] Audry Riles, PharmD   Please be sure to bring in all your medications bottles to every appointment.

## 2021-02-18 NOTE — Progress Notes (Signed)
PCP: Burnard Bunting, MD Cardiology: Dr. Acie Fredrickson HF Cardiology: Dr. Aundra Dubin  87 y.o. with history of chronic atrial fibrillation, diastolic CHF, pulmonary hypertension, [name] was referred by Dr. Acie Fredrickson for evaluation of CHF and pulmonary hypertension. Patient was found to be in atrial fibrillation back in 2017.  At that time, he had a cardioversion.  This did not hold, and he has been in atrial fibrillation since 2017 chronically as far as I can tell. He has had Fe deficiency anemia requiring Fe infusions, GI workup was unrevealing.  Also of note, he has cirrhosis by 9/21 MRI abdomen, cause uncertain (not a heavy drinker).   In the summer of 2022, his legs began to swell significantly and developed weeping sores.  He developed significant dyspnea.  He was seen in the cardiology clinic and started on Lasix.  He cut back on sodium.  Echo in 7/22 showed EF 60-65%, normal RV, PASP 71 mmHg, severe biatrial enlargement, severe TR, probably moderate MR, IVC dilated. Over time, his symptoms haves improved.   TEE in 9/22 showed severe TR (functional) and severe MR (prolapse/partial P3 flail).  LHC/RHC in 9/22 showed minimal CAD, mildly elevated PCWP.     Patient returns for followup of CHF and mitral regurgitation.  Weight is down 2 lbs.  He is back to playing tennis.  No dyspnea on current medication regimen (much improved from this summer). No lightheadedness.  No orthopnea/PND.    Labs (7/22): pro-BNP 1404 Labs (8/22): hgb 12.8, K 4.2, creatinine 1.11 Labs (9/22): K 4.5, creatinine 1.06  PMH: 1. Pulmonary hypertension: Echo in 7/22 with PASP 71 mmHg.  - V/Q scan (8/22): no evidence for chronic PE.  - PFTs (8/22): Minimal airways obstruction - RHC (9/22) showed pulmonary venous hypertension.  2. Fe deficiency anemia: He had extensive GI evaluation in the past that was unrevealing.  Followed by Dr. Jana Hakim, has had Fe infusions.  3. Chronic atrial fibrillation: Diagnosed in 2017,  failed DCCV in 2017 and appears to have been in atrial fibrillation since then.  4. Cirrhosis: 9/21 abdominal MRI showed cirrhosis.  No history of heavy ETOH, cause uncertain.  5. Chronic diastolic CHF: Echo (2/69) with EF 60-65%, normal RV, PASP 71 mmHg, severe biatrial enlargement, severe TR, probably moderate MR, IVC dilated.  6. Valvular heart disease: Echo in 7/22 with probably moderate MR, severe TR.  Suspect functional due to AF/dilated atria.  - TEE (9/22): EF 55-60%, RV moderately dilated with normal systolic function, moderate LAE, severe RAE, severe TR (functional) with peak RV-RA gradient 45 mmHg, severe highly eccentric mitral regurgitation with prolapse and partial flail of the P3 segment of the posterior leaflet.   - LHC/RHC (9/22): Nonobstructive mild CAD; mean RA 6, PA 47/15 mean 31, mean PCWP 15 with v-waves to 32, CI 3.32, PVR 2.5 WU.  7. Left renal mass: S/p left nephrectomy.  8. T-spine compression fracture.   SH: Widower, occasional ETOH (not heavy), nonsmoker.  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  [employer] graduate.   Family History  Problem Relation Age of Onset   Breast cancer Mother    Hypertension Father    Diabetes Neg Hx    Stroke Neg Hx    CAD Neg Hx    Colon cancer Neg Hx    Colon polyps Neg Hx    Esophageal cancer Neg Hx    Rectal cancer Neg Hx    Stomach cancer Neg Hx  ROS: All systems reviewed and negative except as per HPI.   Current Outpatient Medications  Medication Sig Dispense Refill   ALPRAZolam (XANAX) 0.25 MG tablet Take 1 tablet (0.25 mg total) by mouth every 6 (six) hours as needed for anxiety 30 tablet 0   apixaban (ELIQUIS) 5 MG TABS tablet TAKE 1 TABLET BY MOUTH TWICE DAILY 180 tablet 3   Apoaequorin (PREVAGEN EXTRA STRENGTH PO) Take 1 tablet by mouth daily. Takes once daily     Barberry-Oreg Grape-Goldenseal (BERBERINE COMPLEX PO) Take 600 mg by mouth daily. Takes 2 capsule once daily     Chromium 400  MCG TABS Take 400 mcg by mouth daily.     ciclopirox (PENLAC) 8 % solution Apply 1 application topically at bedtime as needed (toe nail fungus).     Coenzyme Q10 (CO Q10) 200 MG CAPS Take 200 mg by mouth daily. Takes one capsule once daily     empagliflozin (JARDIANCE) 10 MG TABS tablet Take 1 tablet (10 mg total) by mouth daily before breakfast. 30 tablet 6   ferrous gluconate (FERGON) 324 MG tablet Take 324 mg by mouth daily with breakfast.     furosemide (LASIX) 40 MG tablet Take 1 tablet (40 mg total) by mouth daily. 30 tablet 3   glucosamine-chondroitin 500-400 MG tablet Take 1 tablet by mouth 2 (two) times daily.      KRILL OIL PO Take 1 capsule by mouth daily at 12 noon.     Linoleic Acid Conjugated 1000 MG CAPS Take 1 capsule by mouth in the morning and at bedtime. Takes one capsule twice daily     losartan (COZAAR) 50 MG tablet TAKE 1 TABLET BY MOUTH EVERY DAY 90 tablet 1   Magnesium Glycinate 665 MG CAPS Take 1 capsule by mouth in the morning and at bedtime. Takes one capsule twice daily     Melatonin 5 MG TABS Take 5 mg by mouth at bedtime. Takes one tablet at bedtime     metoprolol succinate (TOPROL-XL) 50 MG 24 hr tablet TAKE 1 TABLET (50 MG TOTAL) BY MOUTH 2 (TWO) TIMES DAILY. TAKE WITH OR IMMEDIATELY FOLLOWING A MEAL. 180 tablet 1   Multiple Vitamin (MULTIVITAMIN) capsule Take 2 capsules by mouth daily. Takes two tablets once daily     NON FORMULARY Take 1 capsule by mouth 2 (two) times daily. TRU vitamin     omeprazole (PRILOSEC) 20 MG capsule Take 20 mg by mouth daily.     OVER THE COUNTER MEDICATION Take 2 capsules by mouth daily. Prostacare     OVER THE COUNTER MEDICATION Take 2 capsules by mouth daily. Uricare     potassium chloride SA (KLOR-CON) 20 MEQ tablet Take 2 tablets (40 mEq total) by mouth daily. 60 tablet 3   tadalafil (CIALIS) 20 MG tablet TAKE ONE TABLET BY MOUTH DAILY AS NEEDED 30 tablet 11   No current facility-administered medications for this encounter.   BP  110/70   Pulse 61   Wt 74.6 kg (164 lb 6.4 oz)   SpO2 96%   BMI 22.93 kg/m  General: NAD Neck: JVP 7-8 cm, no thyromegaly or thyroid nodule.  Lungs: Clear to auscultation bilaterally with normal respiratory effort. CV: Nondisplaced PMI.  Heart regular S1/S2, no S3/S4, 2/6 HSM apex.  1+ ankle edema.  No carotid bruit.  Normal pedal pulses.  Abdomen: Soft, nontender, no hepatosplenomegaly, no distention.  Skin: Intact without lesions or rashes.  Neurologic: Alert and oriented x 3.  Psych: Normal affect.  Extremities: No clubbing or cyanosis.  HEENT: Normal.   Assessment/Plan: 1. Chronic diastolic CHF: Echo in 2/35 with EF 60-65%, normal RV, PASP 71 mmHg, severe biatrial enlargement, severe TR, probably moderate MR, IVC dilated.  In the summer of 2022, he developed symptomatic CHF, this has improved with diuresis and sodium restriction.  TEE showed severe functional TR and severe primary mitral regurgitation with partial flail posterior leaflet (P3).  I suspect that CHF is due to chronic atrial fibrillation + valvular disease (MR, TR).  He describes minimal symptoms currently, NYHA class II.  Very mildly volume overloaded on exam.  - Continue Lasix 40 mg daily with BMET/BNP today.  - Continue empagliflozin 10 mg daily.    - To prevent progression of CHF, I think that he is going to need correction of mitral regurgitation.  This will be best done by Mitraclip (see below).  2. Pulmonary hypertension: Pulmonary venous hypertension by 9/22 RHC.   3. Atrial fibrillation: This is chronic and likely permanent (present since 2017).  The atria are severely dilated on echo. I do not think that he would be likely to hold NSR with DCCV, even with anti-arrhythmic.  - Continue Eliquis.  - Good rate control on current Toprol XL.  4. Valvular heart disease: Echo in 7/22 showed severe TR and at least moderate MR.  TEE showed severe functional TR and primary mitral regurgitation with partial flail posterior  leaflet (P3).  He has been evaluated for Mitraclip, not good candidate for surgical MV repair.  - Plan for Mitraclip on 02/26/21.  - Would manage TR medically for now, may be candidate for trial of percutaneous TV repair in the future.   Loralie Champagne 02/18/2021

## 2021-02-19 DIAGNOSIS — E785 Hyperlipidemia, unspecified: Secondary | ICD-10-CM | POA: Diagnosis not present

## 2021-02-19 DIAGNOSIS — D508 Other iron deficiency anemias: Secondary | ICD-10-CM | POA: Diagnosis not present

## 2021-02-19 DIAGNOSIS — I10 Essential (primary) hypertension: Secondary | ICD-10-CM | POA: Diagnosis not present

## 2021-02-19 DIAGNOSIS — Z125 Encounter for screening for malignant neoplasm of prostate: Secondary | ICD-10-CM | POA: Diagnosis not present

## 2021-02-20 NOTE — Progress Notes (Signed)
Surgical Instructions    Your procedure is scheduled on 02/26/21.  Report to [facility] Main Entrance "A" at 05:30 A.M., then check in with the Admitting office.  Call this number if you have problems the morning of surgery:  [phone]   If you have any questions prior to your surgery date call [phone]: Open Monday-Friday 8am-4pm    Remember:  Do not eat or drink after midnight the night before your surgery      Take these medicines the morning of surgery with A SIP OF WATER  metoprolol succinate (TOPROL-XL) omeprazole (PRILOSEC)   IF NEEDED: ALPRAZolam (XANAX)  As of today, STOP taking any Aspirin (unless otherwise instructed by your surgeon) Aleve, Naproxen, Ibuprofen, Motrin, Advil, Goody's, BC's, all herbal medications, fish oil, and all vitamins.  STOP ELIQUIS after your 10/10 evening dose.   WHAT DO I DO ABOUT MY DIABETES MEDICATION?   Do not take oral diabetes medicines (pills) the morning of surgery.  THE DAY BEFORE SURGERY, do not take empagliflozin (JARDIANCE).      THE MORNING OF SURGERY, do not take empagliflozin (JARDIANCE).  The day of surgery, do not take other diabetes injectables, including Byetta (exenatide), Bydureon (exenatide ER), Victoza (liraglutide), or Trulicity (dulaglutide).  If your CBG is greater than 220 mg/dL, you may take  of your sliding scale (correction) dose of insulin.   HOW TO MANAGE YOUR DIABETES BEFORE AND AFTER SURGERY  Why is it important to control my blood sugar before and after surgery? Improving blood sugar levels before and after surgery helps healing and can limit problems. A way of improving blood sugar control is eating a healthy diet by:  Eating less sugar and carbohydrates  Increasing activity/exercise  Talking with your doctor about reaching your blood sugar goals High blood sugars (greater than 180 mg/dL) can raise your risk of infections and slow your recovery, so you will need to focus on controlling  your diabetes during the weeks before surgery. Make sure that the doctor who takes care of your diabetes knows about your planned surgery including the date and location.  How do I manage my blood sugar before surgery? Check your blood sugar at least 4 times a day, starting 2 days before surgery, to make sure that the level is not too high or low.  Check your blood sugar the morning of your surgery when you wake up and every 2 hours until you get to the Short Stay unit.  If your blood sugar is less than 70 mg/dL, you will need to treat for low blood sugar: Do not take insulin. Treat a low blood sugar (less than 70 mg/dL) with  cup of clear juice (cranberry or apple), 4 glucose tablets, OR glucose gel. Recheck blood sugar in 15 minutes after treatment (to make sure it is greater than 70 mg/dL). If your blood sugar is not greater than 70 mg/dL on recheck, call [phone] for further instructions. Report your blood sugar to the short stay nurse when you get to Short Stay.  If you are admitted to the hospital after surgery: Your blood sugar will be checked by the staff and you will probably be given insulin after surgery (instead of oral diabetes medicines) to make sure you have good blood sugar levels. The goal for blood sugar control after surgery is 80-180 mg/dL.    After your COVID test   You are not required to quarantine however you are required to wear a well-fitting mask when you are out and  around people not in your household.  If your mask becomes wet or soiled, replace with a new one.  Wash your hands often with soap and water for 20 seconds or clean your hands with an alcohol-based hand sanitizer that contains at least 60% alcohol.  Do not share personal items.  Notify your provider: if you are in close contact with someone who has COVID  or if you develop a fever of 100.4 or greater, sneezing, cough, sore throat, shortness of breath or body aches.             Do not  wear jewelry or makeup Do not wear lotions, powders, perfumes/colognes, or deodorant. Men may shave face and neck. Do not bring valuables to the hospital. DO Not wear nail polish, gel polish, artificial nails, or any other type of covering on natural nails including finger and toenails. If patients have artificial nails, gel coating, etc. that need to be removed by a nail salon, please have this removed prior to surgery or surgery may need to be canceled/delayed if the surgeon/ anesthesia feels like the patient is unable to be adequately monitored.             [place] is not responsible for any belongings or valuables.  Do NOT Smoke (Tobacco/Vaping)  24 hours prior to your procedure  If you use a CPAP at night, you may bring your mask for your overnight stay.   Contacts, glasses, hearing aids, dentures or partials may not be worn into surgery, please bring cases for these belongings   For patients admitted to the hospital, discharge time will be determined by your treatment team.   Patients discharged the day of surgery will not be allowed to drive home, and someone needs to stay with them for 24 hours.  NO VISITORS WILL BE ALLOWED IN PRE-OP WHERE PATIENTS ARE PREPPED FOR SURGERY.  ONLY 1 SUPPORT PERSON MAY BE PRESENT IN THE WAITING ROOM WHILE YOU ARE IN SURGERY.  IF YOU ARE TO BE ADMITTED, ONCE YOU ARE IN YOUR ROOM YOU WILL BE ALLOWED TWO (2) VISITORS. 1 (ONE) VISITOR MAY STAY OVERNIGHT BUT MUST ARRIVE TO THE ROOM BY 8pm.  Minor children may have two parents present. Special consideration for safety and communication needs will be reviewed on a case by case basis.  Special instructions:    Oral Hygiene is also important to reduce your risk of infection.  Remember - BRUSH YOUR TEETH THE MORNING OF SURGERY WITH YOUR REGULAR TOOTHPASTE   [place]- Preparing For Surgery  Before surgery, you can play an important role. Because skin is not sterile, your skin needs to be as free of germs  as possible. You can reduce the number of germs on your skin by washing with CHG (chlorahexidine gluconate) Soap before surgery.  CHG is an antiseptic cleaner which kills germs and bonds with the skin to continue killing germs even after washing.     Please do not use if you have an allergy to CHG or antibacterial soaps. If your skin becomes reddened/irritated stop using the CHG.  Do not shave (including legs and underarms) for at least 48 hours prior to first CHG shower. It is OK to shave your face.  Please follow these instructions carefully.     Shower the NIGHT BEFORE SURGERY and the MORNING OF SURGERY with CHG Soap.   If you chose to wash your hair, wash your hair first as usual with your normal shampoo. After you shampoo, rinse  your hair and body thoroughly to remove the shampoo.  Then [employer] and genitals (private parts) with your normal soap and rinse thoroughly to remove soap.  After that Use CHG Soap as you would any other liquid soap. You can apply CHG directly to the skin and wash gently with a scrungie or a clean washcloth.   Apply the CHG Soap to your body ONLY FROM THE NECK DOWN.  Do not use on open wounds or open sores. Avoid contact with your eyes, ears, mouth and genitals (private parts). Wash Face and genitals (private parts)  with your normal soap.   Wash thoroughly, paying special attention to the area where your surgery will be performed.  Thoroughly rinse your body with warm water from the neck down.  DO NOT shower/wash with your normal soap after using and rinsing off the CHG Soap.  Pat yourself dry with a CLEAN TOWEL.  Wear CLEAN PAJAMAS to bed the night before surgery  Place CLEAN SHEETS on your bed the night before your surgery  DO NOT SLEEP WITH PETS.   Day of Surgery: Take a shower with CHG soap. Wear Clean/Comfortable clothing the morning of surgery Do not apply any deodorants/lotions.   Remember to brush your teeth WITH YOUR REGULAR TOOTHPASTE.    Please read over the following fact sheets that you were given.

## 2021-02-20 NOTE — Telephone Encounter (Signed)
[facility]. Met the patient and gave him a second copy per request. He was grateful for assistance.

## 2021-02-20 NOTE — Telephone Encounter (Signed)
Apologized to patient for inconveniences and misunderstanding. He states he misread the previous messages and thought Medical Records was going to send the CD, not me, so he never sent an address or replied. He states he was called and told that a new CD is now at the [facility] front desk for pick up. He was grateful for follow-up.

## 2021-02-23 ENCOUNTER — Encounter (HOSPITAL_COMMUNITY): Payer: Self-pay

## 2021-02-23 ENCOUNTER — Encounter (HOSPITAL_COMMUNITY)
Admission: RE | Admit: 2021-02-23 | Discharge: 2021-02-23 | Disposition: A | Discharge status: Home / Self Care | Payer: Medicare PPO | Source: Ambulatory Visit | Attending: Cardiovascular Disease | Admitting: Cardiovascular Disease

## 2021-02-23 ENCOUNTER — Ambulatory Visit (HOSPITAL_COMMUNITY): Admission: RE | Admit: 2021-02-23 | Payer: Medicare PPO | Source: Ambulatory Visit

## 2021-02-23 ENCOUNTER — Ambulatory Visit: Payer: Medicare PPO | Attending: Cardiovascular Disease

## 2021-02-23 ENCOUNTER — Other Ambulatory Visit: Payer: Self-pay

## 2021-02-23 ENCOUNTER — Ambulatory Visit (HOSPITAL_COMMUNITY)
Admission: RE | Admit: 2021-02-23 | Discharge: 2021-02-23 | Disposition: A | Discharge status: Home / Self Care | Payer: Medicare PPO | Source: Ambulatory Visit | Attending: Cardiovascular Disease | Admitting: Cardiovascular Disease

## 2021-02-23 DIAGNOSIS — I272 Pulmonary hypertension, unspecified: Secondary | ICD-10-CM | POA: Diagnosis not present

## 2021-02-23 DIAGNOSIS — Z01818 Encounter for other preprocedural examination: Secondary | ICD-10-CM | POA: Diagnosis not present

## 2021-02-23 DIAGNOSIS — Z7984 Long term (current) use of oral hypoglycemic drugs: Secondary | ICD-10-CM | POA: Diagnosis not present

## 2021-02-23 DIAGNOSIS — R0602 Shortness of breath: Secondary | ICD-10-CM | POA: Diagnosis present

## 2021-02-23 DIAGNOSIS — I4819 Other persistent atrial fibrillation: Secondary | ICD-10-CM | POA: Diagnosis not present

## 2021-02-23 DIAGNOSIS — D649 Anemia, unspecified: Secondary | ICD-10-CM | POA: Diagnosis not present

## 2021-02-23 DIAGNOSIS — I482 Chronic atrial fibrillation, unspecified: Secondary | ICD-10-CM | POA: Diagnosis not present

## 2021-02-23 DIAGNOSIS — Z20822 Contact with and (suspected) exposure to covid-19: Secondary | ICD-10-CM | POA: Insufficient documentation

## 2021-02-23 DIAGNOSIS — D696 Thrombocytopenia, unspecified: Secondary | ICD-10-CM | POA: Diagnosis not present

## 2021-02-23 DIAGNOSIS — I110 Hypertensive heart disease with heart failure: Secondary | ICD-10-CM | POA: Diagnosis not present

## 2021-02-23 DIAGNOSIS — I361 Nonrheumatic tricuspid (valve) insufficiency: Secondary | ICD-10-CM | POA: Diagnosis not present

## 2021-02-23 DIAGNOSIS — K219 Gastro-esophageal reflux disease without esophagitis: Secondary | ICD-10-CM | POA: Diagnosis not present

## 2021-02-23 DIAGNOSIS — Z905 Acquired absence of kidney: Secondary | ICD-10-CM | POA: Diagnosis not present

## 2021-02-23 DIAGNOSIS — I340 Nonrheumatic mitral (valve) insufficiency: Secondary | ICD-10-CM | POA: Diagnosis not present

## 2021-02-23 DIAGNOSIS — T8202XA Displacement of heart valve prosthesis, initial encounter: Secondary | ICD-10-CM | POA: Diagnosis not present

## 2021-02-23 DIAGNOSIS — I081 Rheumatic disorders of both mitral and tricuspid valves: Secondary | ICD-10-CM | POA: Diagnosis not present

## 2021-02-23 DIAGNOSIS — Z954 Presence of other heart-valve replacement: Secondary | ICD-10-CM | POA: Diagnosis not present

## 2021-02-23 DIAGNOSIS — Z8249 Family history of ischemic heart disease and other diseases of the circulatory system: Secondary | ICD-10-CM | POA: Diagnosis not present

## 2021-02-23 DIAGNOSIS — Z79890 Hormone replacement therapy (postmenopausal): Secondary | ICD-10-CM | POA: Diagnosis not present

## 2021-02-23 DIAGNOSIS — Z7901 Long term (current) use of anticoagulants: Secondary | ICD-10-CM | POA: Diagnosis not present

## 2021-02-23 DIAGNOSIS — I5032 Chronic diastolic (congestive) heart failure: Secondary | ICD-10-CM | POA: Diagnosis not present

## 2021-02-23 DIAGNOSIS — I517 Cardiomegaly: Secondary | ICD-10-CM | POA: Diagnosis not present

## 2021-02-23 DIAGNOSIS — I083 Combined rheumatic disorders of mitral, aortic and tricuspid valves: Secondary | ICD-10-CM | POA: Diagnosis not present

## 2021-02-23 DIAGNOSIS — D509 Iron deficiency anemia, unspecified: Secondary | ICD-10-CM | POA: Diagnosis not present

## 2021-02-23 DIAGNOSIS — Z006 Encounter for examination for normal comparison and control in clinical research program: Secondary | ICD-10-CM | POA: Diagnosis not present

## 2021-02-23 DIAGNOSIS — Z79899 Other long term (current) drug therapy: Secondary | ICD-10-CM | POA: Diagnosis not present

## 2021-02-23 DIAGNOSIS — R262 Difficulty in walking, not elsewhere classified: Secondary | ICD-10-CM | POA: Insufficient documentation

## 2021-02-23 HISTORY — DX: Heart failure, unspecified: I50.9

## 2021-02-23 HISTORY — DX: Cardiac arrhythmia, unspecified: I49.9

## 2021-02-23 HISTORY — DX: Sleep apnea, unspecified: G47.30

## 2021-02-23 LAB — URINALYSIS, ROUTINE W REFLEX MICROSCOPIC
Bilirubin Urine: NEGATIVE
Glucose, UA: 150 mg/dL — AB
Hgb urine dipstick: NEGATIVE
Ketones, ur: NEGATIVE mg/dL
Leukocytes,Ua: NEGATIVE
Nitrite: NEGATIVE
Protein, ur: NEGATIVE mg/dL
Specific Gravity, Urine: 1.01 (ref 1.005–1.030)
pH: 5 (ref 5.0–8.0)

## 2021-02-23 LAB — COMPREHENSIVE METABOLIC PANEL
ALT: 23 U/L (ref 0–44)
AST: 38 U/L (ref 15–41)
Albumin: 4 g/dL (ref 3.5–5.0)
Alkaline Phosphatase: 75 U/L (ref 38–126)
Anion gap: 9 (ref 5–15)
BUN: 26 mg/dL — ABNORMAL HIGH (ref 8–23)
CO2: 22 mmol/L (ref 22–32)
Calcium: 9.1 mg/dL (ref 8.9–10.3)
Chloride: 104 mmol/L (ref 98–111)
Creatinine, Ser: 1.05 mg/dL (ref 0.61–1.24)
GFR, Estimated: >60 mL/min (ref >60)
Glucose, Bld: 110 mg/dL — ABNORMAL HIGH (ref 70–99)
Potassium: 4.1 mmol/L (ref 3.5–5.1)
Sodium: 135 mmol/L (ref 135–145)
Total Bilirubin: 1.5 mg/dL — ABNORMAL HIGH (ref 0.3–1.2)
Total Protein: 6.6 g/dL (ref 6.5–8.1)

## 2021-02-23 LAB — CBC
HCT: 43.6 % (ref 39.0–52.0)
Hemoglobin: 14.1 g/dL (ref 13.0–17.0)
MCH: 29.7 pg (ref 26.0–34.0)
MCHC: 32.3 g/dL (ref 30.0–36.0)
MCV: 92 fL (ref 80.0–100.0)
Platelets: 126 10*3/uL — ABNORMAL LOW (ref 150–400)
RBC: 4.74 MIL/uL (ref 4.22–5.81)
RDW: 16.3 % — ABNORMAL HIGH (ref 11.5–15.5)
WBC: 4.1 10*3/uL (ref 4.0–10.5)
nRBC: 0 % (ref 0.0–0.2)

## 2021-02-23 LAB — TYPE AND SCREEN
ABO/RH(D): O POS
Antibody Screen: NEGATIVE

## 2021-02-23 LAB — BLOOD GAS, ARTERIAL
Acid-base deficit: 1.6 mmol/L (ref 0.0–2.0)
Bicarbonate: 22.6 mmol/L (ref 20.0–28.0)
FIO2: 21
O2 Saturation: 96.8 %
Patient temperature: 37
pCO2 arterial: 38.4 mmHg (ref 32.0–48.0)
pH, Arterial: 7.389 (ref 7.350–7.450)
pO2, Arterial: 93.4 mmHg (ref 83.0–108.0)

## 2021-02-23 LAB — SURGICAL PCR SCREEN
MRSA, PCR: NEGATIVE
Staphylococcus aureus: NEGATIVE

## 2021-02-23 LAB — PROTIME-INR
INR: 1.2 (ref 0.8–1.2)
Prothrombin Time: 15.1 seconds (ref 11.4–15.2)

## 2021-02-23 LAB — SARS CORONAVIRUS 2 (TAT 6-24 HRS): SARS Coronavirus 2: NEGATIVE

## 2021-02-23 NOTE — Progress Notes (Signed)
PCP - Lesly Dukes Cardiologist - Dr. Arnette Norris Nahser,Dr. Rowland Lathe  PPM/ICD - denies Device Orders -  Rep Notified -   Chest x-ray -02/23/21  EKG - 02/23/21 Stress Test - none ECHO - 01/27/21 Cardiac Cath - 01/27/21  Sleep Study - none CPAP - none  Fasting Blood Sugar - n/a Checks Blood Sugar _____ times a day  Blood Thinner Instructions:stop Eliquis after 10/10 22 evening dose.  Aspirin Instructions:n/a  ERAS Protcol -no PRE-SURGERY Ensure or G2- no  COVID TEST- 02/23/21   Anesthesia review: yes  Patient denies shortness of breath, fever, cough and chest pain at PAT appointment   All instructions explained to the patient, with a verbal understanding of the material. Patient agrees to go over the instructions while at home for a better understanding. Patient also instructed to self quarantine after being tested for COVID-19. The opportunity to ask questions was provided.

## 2021-02-23 NOTE — Therapy (Signed)
[place], [place], [address] Phone: [phone]   Fax:  [phone]  Physical Therapy Evaluation  Patient Details  Name: John Holder MRN: [id number] Date of Birth: [birth year] Referring Provider (PT): [name], MD   Encounter Date: 02/23/2021   PT End of Session - 02/23/21 1149     Visit Number 1    Authorization Type [employer] MCR    PT Start Time 1150    PT Stop Time 1225    PT Time Calculation (min) 35 min    Equipment Utilized During Treatment Gait belt    Activity Tolerance Patient tolerated treatment well    Behavior During Therapy [facility] for tasks assessed/performed             Past Medical History:  Diagnosis Date   Blood transfusion without reported diagnosis    Cataract    removed in replace bilateral   GERD (gastroesophageal reflux disease)    Hypertension    Iron deficiency anemia    amdx 08/2018 >> Tx with PRBCs and Feraheme started; no GI source per workup; followed by Heme (Dr. Jana Hakim) and GI (Dr. Cristina Gong)    Left renal mass    s/p L nephrectomy   Mitral regurgitation    Echo 02/2016:  Mild conc LVH, EF 55-60, mild to mod MR, mod LAE, mod to severe RAE, mod TR, PASP 41 // Echo 10/2018: EF 60-65, mod conc LVH, severe BAE, trivial eff, mild MVP with mod MR, mod to severe TR, asc aorta 36 (mild dilation)    Persistent Atrial Fibrillation    Pulmonary hypertension ([place])    Echocardiogram 7/22: EF 60-65, no RWMA, normal RVSF, RVSP 70.7 (severe pulmonary hypertension), severe BAE, mod MR, severe TR, mild to mod AV sclerosis w/o AS    Past Surgical History:  Procedure Laterality Date   CARDIOVERSION N/A 02/04/2016   Procedure: CARDIOVERSION;  Surgeon: Thayer Headings, MD;  Location: [place];  Service: Cardiovascular;  Laterality: N/A;   COLONOSCOPY     COLONOSCOPY WITH PROPOFOL N/A 09/10/2018   Procedure: COLONOSCOPY WITH PROPOFOL;  Surgeon: Ronald Lobo, MD;  Location: [facility]  ENDOSCOPY;  Service: Endoscopy;  Laterality: N/A;   ESOPHAGOGASTRODUODENOSCOPY (EGD) WITH PROPOFOL N/A 09/10/2018   Procedure: ESOPHAGOGASTRODUODENOSCOPY (EGD) WITH PROPOFOL;  Surgeon: Ronald Lobo, MD;  Location: [facility] ENDOSCOPY;  Service: Endoscopy;  Laterality: N/A;   GIVENS CAPSULE STUDY N/A 09/10/2018   Procedure: GIVENS CAPSULE STUDY;  Surgeon: Ronald Lobo, MD;  Location: [facility] ENDOSCOPY;  Service: Endoscopy;  Laterality: N/A;   RIGHT/LEFT HEART CATH AND CORONARY ANGIOGRAPHY N/A 01/27/2021   Procedure: RIGHT/LEFT HEART CATH AND CORONARY ANGIOGRAPHY;  Surgeon: Larey Dresser, MD;  Location: [place] [facility];  Service: Cardiovascular;  Laterality: N/A;   TEE WITHOUT CARDIOVERSION N/A 01/27/2021   Procedure: TRANSESOPHAGEAL ECHOCARDIOGRAM (TEE);  Surgeon: Larey Dresser, MD;  Location: [facility] ENDOSCOPY;  Service: Cardiovascular;  Laterality: N/A;    There were no vitals filed for this visit.    Subjective Assessment - 02/23/21 1150     Subjective Patient reports about 2 years ago he had an episode of anemia. His hemoglobin was at 6 and he was in the ER. He feels this is when everything started. He reports his main symptom is shortness of breath that he has attributed to his anemia, but his leakage in his heart valve has also become concerning as this has worsened. He denies any chest pain, palpitations, or dizziness. He is currently playing tennis  twice weekly, but when he has episodes of shortness of breath that keep him from playing.    Pertinent History see PMH above    Currently in Pain? No/denies                [facility] PT Assessment - 02/23/21 0001       Assessment   Medical Diagnosis I34.0 (ICD-10-CM) - Severe mitral insufficiency    Referring Provider (PT) [name], MD    Onset Date/Surgical Date --   3 years ago   Hand Dominance Right    Next MD Visit 02/23/21    Prior Therapy no      Precautions   Precautions None      Restrictions   Weight Bearing  Restrictions No      Balance Screen   Has the patient fallen in the past 6 months No      [place] residence    Living Arrangements Spouse/significant other    Type of [place]    Additional Comments no stairs      Prior Function   Level of [place] Retired    Leisure tennis, walking, Librarian, academic   Overall Cognitive Status Within Functional Limits for tasks assessed      Posture/Postural Control   Posture/Postural Control Postural limitations    Postural Limitations Rounded Shoulders;Forward head      AROM   Overall AROM Comments UE/LE AROM WNL bilaterally      Strength   Overall Strength Comments Gross UE strength 5/5 bilateral; Gross LE strength 4+/5 bilateral    Right Hand Grip (lbs) 75    Left Hand Grip (lbs) 75      6 Minute Walk- Baseline   6 Minute Walk- Baseline yes    BP (mmHg) 127/89    HR (bpm) 75    02 Sat (%RA) 98 %    Modified Borg Scale for Dyspnea 0- Nothing at all    Perceived Rate of Exertion (Borg) 6-      6 Minute walk- Post Test   6 Minute Walk Post Test yes              [facility] Pre-Surgical Assessment - 02/23/21 0001     5 Meter Walk Test- trial 1 5 sec    5 Meter Walk Test- trial 2 4 sec.     5 Meter Walk Test- trial 3 4 sec.    5 meter walk test average 4.33 sec    4 Stage Balance Test tolerated for:  10 sec.    4 Stage Balance Test Position 3    Sit To Stand Test- trial 1 12 sec.    BP (mmHg) 136/84    HR (bpm) 109    02 Sat (%RA) 87 %    Modified Borg Scale for Dyspnea 1- Very mild shortness of breath    Perceived Rate of Exertion (Borg) 11- Fairly light    Aerobic Endurance Distance Walked 1480    Endurance additional comments 14.4% disability compared to age related normative value                      Objective measurements completed on examination: See above findings.                             Plan -  02/23/21 1152  PT Frequency One time visit    Consulted and Agree with Plan of Care Patient             Clinical Impression Statement: Pt is a 87 yo male presenting to OP PT for evaluation prior to possible MitraClip surgery due to mitral insufficiency. Pt reports onset of SOB 3 years ago. Symptoms are  limiting recreational activity. Pt presents with WNL ROM and strength and 0/10 musculoskeletal pain.  Pt ambulated a total of 1480 feet in 6 minute walk requiring no rest breaks and reported 1/10 SOB on modified scale for dyspena and 11/20 RPE on Borg's perceived exertion and 0/10 pain scale at the end of the walk. During the 6 minute walk test, patient's HR increased to 112 BPM and O2 saturation decreased to 87%. Based on the Short Physical Performance Battery, patient has a frailty rating of 10/12 with </= 5/12 considered frail.     Visit Diagnosis: Difficulty in walking, not elsewhere classified     Problem List Patient Active Problem List   Diagnosis Date Noted   Pulmonary hypertension ([place]) 12/13/2020   Thrombocytopenia ([place]) 01/09/2020   Mucosal abnormality of intestine 12/31/2019   Iron deficiency anemia 09/09/2018   Atrial fibrillation, chronic ([place]) 09/09/2018   HTN (hypertension) 09/09/2018   Hypoxia 09/08/2018   Symptomatic anemia 09/08/2018   Pancytopenia ([place]) 09/08/2018   Knee pain, bilateral 09/30/2014   Gwendolyn Grant, PT, DPT, ATC 02/23/21 12:31 PM  [facility] [address] [place], [place], [address] Phone: [phone]   Fax:  [phone]  Name: John Holder MRN: [id number] Date of Birth: 07-Sep-1931

## 2021-02-24 ENCOUNTER — Other Ambulatory Visit (HOSPITAL_COMMUNITY): Payer: Medicare PPO

## 2021-02-25 ENCOUNTER — Other Ambulatory Visit: Payer: Self-pay

## 2021-02-25 ENCOUNTER — Other Ambulatory Visit (HOSPITAL_COMMUNITY): Payer: Self-pay

## 2021-02-25 MED ORDER — TRANEXAMIC ACID (OHS) BOLUS VIA INFUSION: 15.0000 mg/kg | INTRAVENOUS | Status: DC

## 2021-02-25 MED ORDER — PHENYLEPHRINE HCL-NACL 20-0.9 MG/250ML-% IV SOLN
30.0000 ug/min | INTRAVENOUS | Status: AC
  Administered 2021-02-26: 50 ug/min via INTRAVENOUS
  Filled 2021-02-25 (×2): qty 250

## 2021-02-25 MED ORDER — POTASSIUM CHLORIDE 2 MEQ/ML IV SOLN
80.0000 meq | INTRAVENOUS | Status: DC
  Filled 2021-02-25: qty 40

## 2021-02-25 MED ORDER — HEPARIN 30,000 UNITS/1000 ML (OHS) CELLSAVER SOLUTION
Status: DC
  Filled 2021-02-25: qty 1000

## 2021-02-25 MED ORDER — OMEPRAZOLE 20 MG PO CPDR
20.0000 mg | DELAYED_RELEASE_CAPSULE | Freq: Every day | ORAL | 3 refills | Status: DC
  Filled 2021-04-29: qty 90, 90d supply, fill #0
  Filled 2021-08-13 – 2021-08-14 (×2): qty 90, 90d supply, fill #1
  Filled 2021-11-08: qty 90, 90d supply, fill #2
  Filled 2022-02-09: qty 90, 90d supply, fill #3

## 2021-02-25 MED ORDER — INSULIN REGULAR(HUMAN) IN NACL 100-0.9 UT/100ML-% IV SOLN
INTRAVENOUS | Status: DC
  Filled 2021-02-25 (×2): qty 100

## 2021-02-25 MED ORDER — PLASMA-LYTE A IV SOLN
INTRAVENOUS | Status: DC
  Filled 2021-02-25: qty 5

## 2021-02-25 MED ORDER — NOREPINEPHRINE 4 MG/250ML-% IV SOLN
0.0000 ug/min | INTRAVENOUS | Status: DC
  Filled 2021-02-25: qty 250

## 2021-02-25 MED ORDER — MAGNESIUM SULFATE 50 % IJ SOLN
40.0000 meq | INTRAMUSCULAR | Status: DC
  Filled 2021-02-25: qty 9.85

## 2021-02-25 MED ORDER — MILRINONE LACTATE IN DEXTROSE 20-5 MG/100ML-% IV SOLN
0.3000 ug/kg/min | INTRAVENOUS | Status: DC
  Filled 2021-02-25 (×2): qty 100

## 2021-02-25 MED ORDER — TRANEXAMIC ACID 1000 MG/10ML IV SOLN
1.5000 mg/kg/h | INTRAVENOUS | Status: DC
  Filled 2021-02-25: qty 25

## 2021-02-25 MED ORDER — VANCOMYCIN HCL 1250 MG/250ML IV SOLN
1250.0000 mg | INTRAVENOUS | Status: DC
  Filled 2021-02-25 (×2): qty 250

## 2021-02-25 MED ORDER — CEFAZOLIN SODIUM-DEXTROSE 2-4 GM/100ML-% IV SOLN
2.0000 g | INTRAVENOUS | Status: DC
  Filled 2021-02-25 (×2): qty 100

## 2021-02-25 MED ORDER — NITROGLYCERIN IN D5W 200-5 MCG/ML-% IV SOLN
2.0000 ug/min | INTRAVENOUS | Status: DC
  Filled 2021-02-25: qty 250

## 2021-02-25 MED ORDER — CEFAZOLIN SODIUM-DEXTROSE 2-4 GM/100ML-% IV SOLN
2.0000 g | INTRAVENOUS | Status: AC
  Administered 2021-02-26: 2 g via INTRAVENOUS
  Filled 2021-02-25 (×2): qty 100

## 2021-02-25 MED ORDER — DEXMEDETOMIDINE HCL IN NACL 400 MCG/100ML IV SOLN
0.1000 ug/kg/h | INTRAVENOUS | Status: DC
  Filled 2021-02-25 (×2): qty 100

## 2021-02-25 MED ORDER — EPINEPHRINE HCL 5 MG/250ML IV SOLN IN NS
0.0000 ug/min | INTRAVENOUS | Status: DC
  Filled 2021-02-25 (×2): qty 250

## 2021-02-25 MED ORDER — TRANEXAMIC ACID (OHS) PUMP PRIME SOLUTION
2.0000 mg/kg | INTRAVENOUS | Status: DC
  Filled 2021-02-25: qty 1.47

## 2021-02-25 MED FILL — Omeprazole Cap Delayed Release 20 MG: ORAL | 90 days supply | Qty: 90 | Fill #0 | Status: AC

## 2021-02-26 ENCOUNTER — Inpatient Hospital Stay (HOSPITAL_COMMUNITY)
Admission: RE | Admit: 2021-02-26 | Discharge: 2021-02-26 | Disposition: A | Discharge status: Home / Self Care | Payer: Medicare PPO | Source: Ambulatory Visit | Attending: Cardiovascular Disease | Admitting: Cardiovascular Disease

## 2021-02-26 ENCOUNTER — Encounter (HOSPITAL_COMMUNITY): Payer: Self-pay | Admitting: Cardiovascular Disease

## 2021-02-26 ENCOUNTER — Inpatient Hospital Stay (HOSPITAL_COMMUNITY): Payer: Medicare PPO | Admitting: Emergency Medicine

## 2021-02-26 ENCOUNTER — Inpatient Hospital Stay (HOSPITAL_COMMUNITY): Payer: Medicare PPO | Admitting: Certified Registered"

## 2021-02-26 ENCOUNTER — Encounter (HOSPITAL_COMMUNITY)
Admission: RE | Disposition: A | Discharge status: Home / Self Care | Payer: Medicare PPO | Source: Home / Self Care | Attending: Cardiovascular Disease

## 2021-02-26 ENCOUNTER — Other Ambulatory Visit: Payer: Self-pay

## 2021-02-26 ENCOUNTER — Inpatient Hospital Stay (HOSPITAL_COMMUNITY)
Admission: RE | Admit: 2021-02-26 | Discharge: 2021-02-27 | DRG: 267 | Disposition: A | Discharge status: Home / Self Care | Payer: Medicare PPO | Attending: Cardiovascular Disease | Admitting: Cardiovascular Disease

## 2021-02-26 DIAGNOSIS — I4819 Other persistent atrial fibrillation: Secondary | ICD-10-CM | POA: Diagnosis present

## 2021-02-26 DIAGNOSIS — Z7901 Long term (current) use of anticoagulants: Secondary | ICD-10-CM | POA: Diagnosis not present

## 2021-02-26 DIAGNOSIS — Z20822 Contact with and (suspected) exposure to covid-19: Secondary | ICD-10-CM | POA: Diagnosis present

## 2021-02-26 DIAGNOSIS — D649 Anemia, unspecified: Secondary | ICD-10-CM | POA: Diagnosis present

## 2021-02-26 DIAGNOSIS — I083 Combined rheumatic disorders of mitral, aortic and tricuspid valves: Principal | ICD-10-CM | POA: Diagnosis present

## 2021-02-26 DIAGNOSIS — Z905 Acquired absence of kidney: Secondary | ICD-10-CM

## 2021-02-26 DIAGNOSIS — Z006 Encounter for examination for normal comparison and control in clinical research program: Secondary | ICD-10-CM

## 2021-02-26 DIAGNOSIS — Z79899 Other long term (current) drug therapy: Secondary | ICD-10-CM

## 2021-02-26 DIAGNOSIS — I340 Nonrheumatic mitral (valve) insufficiency: Secondary | ICD-10-CM | POA: Diagnosis present

## 2021-02-26 DIAGNOSIS — I272 Pulmonary hypertension, unspecified: Secondary | ICD-10-CM | POA: Diagnosis present

## 2021-02-26 DIAGNOSIS — Z954 Presence of other heart-valve replacement: Secondary | ICD-10-CM | POA: Diagnosis not present

## 2021-02-26 DIAGNOSIS — T8202XA Displacement of heart valve prosthesis, initial encounter: Secondary | ICD-10-CM | POA: Diagnosis not present

## 2021-02-26 DIAGNOSIS — I5032 Chronic diastolic (congestive) heart failure: Secondary | ICD-10-CM | POA: Diagnosis present

## 2021-02-26 DIAGNOSIS — Z8249 Family history of ischemic heart disease and other diseases of the circulatory system: Secondary | ICD-10-CM

## 2021-02-26 DIAGNOSIS — R0602 Shortness of breath: Secondary | ICD-10-CM | POA: Diagnosis present

## 2021-02-26 DIAGNOSIS — I10 Essential (primary) hypertension: Secondary | ICD-10-CM | POA: Diagnosis present

## 2021-02-26 DIAGNOSIS — Z79890 Hormone replacement therapy (postmenopausal): Secondary | ICD-10-CM | POA: Diagnosis not present

## 2021-02-26 DIAGNOSIS — Z9889 Other specified postprocedural states: Secondary | ICD-10-CM

## 2021-02-26 DIAGNOSIS — K219 Gastro-esophageal reflux disease without esophagitis: Secondary | ICD-10-CM | POA: Diagnosis present

## 2021-02-26 DIAGNOSIS — I482 Chronic atrial fibrillation, unspecified: Secondary | ICD-10-CM | POA: Diagnosis present

## 2021-02-26 DIAGNOSIS — Z7984 Long term (current) use of oral hypoglycemic drugs: Secondary | ICD-10-CM | POA: Diagnosis not present

## 2021-02-26 DIAGNOSIS — I361 Nonrheumatic tricuspid (valve) insufficiency: Secondary | ICD-10-CM | POA: Diagnosis not present

## 2021-02-26 DIAGNOSIS — Z95818 Presence of other cardiac implants and grafts: Secondary | ICD-10-CM

## 2021-02-26 DIAGNOSIS — Z952 Presence of prosthetic heart valve: Secondary | ICD-10-CM

## 2021-02-26 DIAGNOSIS — I110 Hypertensive heart disease with heart failure: Secondary | ICD-10-CM | POA: Diagnosis present

## 2021-02-26 HISTORY — PX: TEE WITHOUT CARDIOVERSION: SHX5443

## 2021-02-26 HISTORY — DX: Presence of other cardiac implants and grafts: Z98.890

## 2021-02-26 HISTORY — PX: MITRAL VALVE REPAIR: CATH118311

## 2021-02-26 LAB — ECHO TEE
MV M vel: 4.54 m/s
MV Peak grad: 82.3 mmHg
Radius: 1.75 cm

## 2021-02-26 LAB — GLUCOSE, CAPILLARY: Glucose-Capillary: 112 mg/dL — ABNORMAL HIGH (ref 70–99)

## 2021-02-26 LAB — POCT ACTIVATED CLOTTING TIME
Activated Clotting Time: 271 seconds
Activated Clotting Time: 289 seconds

## 2021-02-26 SURGERY — MITRAL VALVE REPAIR
Anesthesia: General

## 2021-02-26 MED ORDER — CHLORHEXIDINE GLUCONATE 4 % EX LIQD: 30.0000 mL | CUTANEOUS | Status: DC

## 2021-02-26 MED ORDER — APIXABAN 5 MG PO TABS
5.0000 mg | ORAL_TABLET | Freq: Two times a day (BID) | ORAL | Status: DC
  Administered 2021-02-26 – 2021-02-27 (×2): 5 mg via ORAL
  Filled 2021-02-26 (×2): qty 1

## 2021-02-26 MED ORDER — HEPARIN (PORCINE) IN NACL 2000-0.9 UNIT/L-% IV SOLN
INTRAVENOUS | Status: DC | PRN
  Administered 2021-02-26 (×3): 1000 mL

## 2021-02-26 MED ORDER — PANTOPRAZOLE SODIUM 40 MG PO TBEC
40.0000 mg | DELAYED_RELEASE_TABLET | Freq: Every day | ORAL | Status: DC
  Administered 2021-02-27: 40 mg via ORAL
  Filled 2021-02-26 (×2): qty 1

## 2021-02-26 MED ORDER — CEFAZOLIN SODIUM-DEXTROSE 2-4 GM/100ML-% IV SOLN
2.0000 g | INTRAVENOUS | Status: DC
  Filled 2021-02-26: qty 100

## 2021-02-26 MED ORDER — HEPARIN SODIUM (PORCINE) 1000 UNIT/ML IJ SOLN
INTRAMUSCULAR | Status: DC | PRN
  Administered 2021-02-26: 3000 [IU] via INTRAVENOUS
  Administered 2021-02-26: 11000 [IU] via INTRAVENOUS

## 2021-02-26 MED ORDER — CHLORHEXIDINE GLUCONATE 4 % EX LIQD: 60.0000 mL | Freq: Once | CUTANEOUS | Status: DC

## 2021-02-26 MED ORDER — ACETAMINOPHEN 325 MG PO TABS: 650.0000 mg | ORAL_TABLET | ORAL | Status: DC | PRN

## 2021-02-26 MED ORDER — EMPAGLIFLOZIN 10 MG PO TABS
10.0000 mg | ORAL_TABLET | Freq: Every day | ORAL | Status: DC
  Administered 2021-02-27: 10 mg via ORAL
  Filled 2021-02-26: qty 1

## 2021-02-26 MED ORDER — SODIUM CHLORIDE 0.9 % IV SOLN: INTRAVENOUS | Status: DC

## 2021-02-26 MED ORDER — GLYCOPYRROLATE PF 0.2 MG/ML IJ SOSY
PREFILLED_SYRINGE | INTRAMUSCULAR | Status: DC | PRN
  Administered 2021-02-26: .1 mg via INTRAVENOUS

## 2021-02-26 MED ORDER — SODIUM CHLORIDE 0.9% FLUSH: 3.0000 mL | INTRAVENOUS | Status: DC | PRN

## 2021-02-26 MED ORDER — ONDANSETRON HCL 4 MG/2ML IJ SOLN
INTRAMUSCULAR | Status: DC | PRN
  Administered 2021-02-26: 4 mg via INTRAVENOUS

## 2021-02-26 MED ORDER — POTASSIUM CHLORIDE CRYS ER 20 MEQ PO TBCR
40.0000 meq | EXTENDED_RELEASE_TABLET | Freq: Every day | ORAL | Status: DC
  Administered 2021-02-27: 40 meq via ORAL
  Filled 2021-02-26 (×2): qty 2

## 2021-02-26 MED ORDER — LIDOCAINE 2% (20 MG/ML) 5 ML SYRINGE
INTRAMUSCULAR | Status: DC | PRN
  Administered 2021-02-26: 60 mg via INTRAVENOUS

## 2021-02-26 MED ORDER — FUROSEMIDE 40 MG PO TABS
40.0000 mg | ORAL_TABLET | Freq: Every day | ORAL | Status: DC
  Administered 2021-02-27: 40 mg via ORAL
  Filled 2021-02-26 (×2): qty 1

## 2021-02-26 MED ORDER — METOPROLOL SUCCINATE ER 50 MG PO TB24
50.0000 mg | ORAL_TABLET | Freq: Two times a day (BID) | ORAL | Status: DC
  Administered 2021-02-26 – 2021-02-27 (×2): 50 mg via ORAL
  Filled 2021-02-26 (×2): qty 1

## 2021-02-26 MED ORDER — ONDANSETRON HCL 4 MG/2ML IJ SOLN: 4.0000 mg | Freq: Four times a day (QID) | INTRAMUSCULAR | Status: DC | PRN

## 2021-02-26 MED ORDER — HEPARIN (PORCINE) IN NACL 1000-0.9 UT/500ML-% IV SOLN
INTRAVENOUS | Status: DC | PRN
  Administered 2021-02-26: 500 mL

## 2021-02-26 MED ORDER — SODIUM CHLORIDE 0.9 % IV SOLN: 250.0000 mL | INTRAVENOUS | Status: DC | PRN

## 2021-02-26 MED ORDER — CHLORHEXIDINE GLUCONATE 0.12 % MT SOLN
15.0000 mL | Freq: Once | OROMUCOSAL | Status: AC
  Administered 2021-02-26: 15 mL via OROMUCOSAL
  Filled 2021-02-26: qty 15

## 2021-02-26 MED ORDER — LACTATED RINGERS IV SOLN: INTRAVENOUS | Status: DC | PRN

## 2021-02-26 MED ORDER — ROCURONIUM BROMIDE 10 MG/ML (PF) SYRINGE
PREFILLED_SYRINGE | INTRAVENOUS | Status: DC | PRN
  Administered 2021-02-26: 60 mg via INTRAVENOUS

## 2021-02-26 MED ORDER — PROPOFOL 10 MG/ML IV BOLUS
INTRAVENOUS | Status: DC | PRN
  Administered 2021-02-26: 20 mg via INTRAVENOUS
  Administered 2021-02-26: 90 mg via INTRAVENOUS

## 2021-02-26 MED ORDER — SUGAMMADEX SODIUM 200 MG/2ML IV SOLN
INTRAVENOUS | Status: DC | PRN
  Administered 2021-02-26: 400 mg via INTRAVENOUS

## 2021-02-26 MED ORDER — LOSARTAN POTASSIUM 50 MG PO TABS
50.0000 mg | ORAL_TABLET | Freq: Every day | ORAL | Status: DC
  Administered 2021-02-27: 50 mg via ORAL
  Filled 2021-02-26 (×2): qty 1

## 2021-02-26 MED ORDER — SODIUM CHLORIDE 0.9% FLUSH
3.0000 mL | Freq: Two times a day (BID) | INTRAVENOUS | Status: DC
  Administered 2021-02-26 (×2): 3 mL via INTRAVENOUS

## 2021-02-26 SURGICAL SUPPLY — 18 items
CATH MITRA STEERABLE GUIDE (CATHETERS) ×2 IMPLANT
CLIP MITRA G4 DELIVERY SYS NTW (Clip) ×2 IMPLANT
CLOSURE PERCLOSE PROSTYLE (VASCULAR PRODUCTS) ×4 IMPLANT
ELECT DEFIB PAD ADLT CADENCE (PAD) ×2 IMPLANT
KIT DILATOR VASC 18G NDL (KITS) ×2 IMPLANT
KIT HEART LEFT (KITS) ×4 IMPLANT
KIT VERSACROSS LRG ACCESS (CATHETERS) ×2 IMPLANT
PACK CARDIAC CATHETERIZATION (CUSTOM PROCEDURE TRAY) ×2 IMPLANT
SHEATH PINNACLE 8F 10CM (SHEATH) ×2 IMPLANT
SHEATH PROBE COVER 6X72 (BAG) ×4 IMPLANT
SHIELD RADPAD SCOOP 12X17 (MISCELLANEOUS) ×2 IMPLANT
STOPCOCK MORSE 400PSI 3WAY (MISCELLANEOUS) ×12 IMPLANT
SYSTEM MITRACLIP G4 (SYSTAGENIX WOUND MANAGEMENT) ×2 IMPLANT
TRANSDUCER W/STOPCOCK (MISCELLANEOUS) ×2 IMPLANT
TUBING ART PRESS 72  MALE/FEM (TUBING) ×2
TUBING ART PRESS 72 MALE/FEM (TUBING) ×1 IMPLANT
TUBING CIL FLEX 10 FLL-RA (TUBING) ×2 IMPLANT
WIRE EMERALD 3MM-J .035X150CM (WIRE) ×2 IMPLANT

## 2021-02-26 NOTE — Interval H&P Note (Signed)
History and Physical Interval Note:  02/26/2021 7:27 AM  John Holder  has presented today for surgery, with the diagnosis of severe mitral insufficiency.  The various methods of treatment have been discussed with the patient and family. After consideration of risks, benefits and other options for treatment, the patient has consented to  Procedure(s): MITRAL VALVE REPAIR (N/A) TRANSESOPHAGEAL ECHOCARDIOGRAM (TEE) (N/A) as a surgical intervention.  The patient's history has been reviewed, patient examined, no change in status, stable for surgery.  I have reviewed the patient's chart and labs.  Questions were answered to the patient's satisfaction.    Pt reports no changes in clinical symptoms, continues to be very functional, reporting NYHA II symptoms of mild exertional dyspnea and fatigue, much worse when he is anemic. Otherwise no clinical changes since I have seen him. All of his questions are answered this morning.  [name]

## 2021-02-26 NOTE — H&P (View-Only) (Signed)
  [place] VALVE TEAM  Patient doing well s/p TEER. He is hemodynamically stable. Groin site stable. Arterial line discontinued and transferred to [facility]. Plan for early ambulation after bedrest completed and hopeful discharge over the next 24-48 hours after AM echocardiogram and cardiac rehabilitation.   Kathyrn Drown NP-C Structural Heart Team  Pager: [phone]

## 2021-02-26 NOTE — Anesthesia Procedure Notes (Signed)
Procedure Name: Intubation Date/Time: 02/26/2021 7:58 AM Performed by: Georgia Duff, CRNA Pre-anesthesia Checklist: Patient identified, Emergency Drugs available, Suction available and Patient being monitored Patient Re-evaluated:Patient Re-evaluated prior to induction Oxygen Delivery Method: Circle System Utilized Preoxygenation: Pre-oxygenation with 100% oxygen Induction Type: IV induction Ventilation: Mask ventilation without difficulty Laryngoscope Size: Miller and 2 Grade View: Grade II Tube type: Oral Tube size: 7.5 mm Number of attempts: 1 Airway Equipment and Method: Stylet and Oral airway Placement Confirmation: ETT inserted through vocal cords under direct vision, positive ETCO2 and breath sounds checked- equal and bilateral Secured at: 22 cm Tube secured with: Tape Dental Injury: Teeth and Oropharynx as per pre-operative assessment

## 2021-02-26 NOTE — Progress Notes (Signed)
  [place] VALVE TEAM  Patient doing well s/p TEER. He is hemodynamically stable. Groin site stable. Arterial line discontinued and transferred to [facility]. Plan for early ambulation after bedrest completed and hopeful discharge over the next 24-48 hours after AM echocardiogram and cardiac rehabilitation.   Kathyrn Drown NP-C Structural Heart Team  Pager: [phone]

## 2021-02-26 NOTE — Anesthesia Procedure Notes (Signed)
Arterial Line Insertion Start/End10/13/2022 7:10 AM, 02/26/2021 7:20 AM Performed by: Oleta Mouse, MD, CRNA  Preanesthetic checklist: patient identified Lidocaine 1% used for infiltration Right, radial was placed Catheter size: 20 G Hand hygiene performed   Attempts: 2 Procedure performed without using ultrasound guided technique. Following insertion, dressing applied and Biopatch. Post procedure assessment: normal  Patient tolerated the procedure well with no immediate complications.

## 2021-02-26 NOTE — Anesthesia Preprocedure Evaluation (Signed)
Anesthesia Evaluation  Patient identified by MRN, date of birth, ID band Patient awake    Reviewed: Allergy & Precautions, NPO status , Patient's Chart, lab work & pertinent test results  History of Anesthesia Complications Negative for: history of anesthetic complications  Airway Mallampati: II  TM Distance: >3 FB Neck ROM: Full    Dental  (+) Dental Advisory Given, Teeth Intact   Pulmonary neg shortness of breath, neg sleep apnea, neg COPD, neg recent URI,    breath sounds clear to auscultation       Cardiovascular hypertension, Pt. on medications and Pt. on home beta blockers (-) angina+ CAD and +CHF  + dysrhythmias Atrial Fibrillation  Rhythm:Regular  1. Left ventricular ejection fraction, by estimation, is 55 to 60%. The  left ventricle has normal function. The left ventricle has no regional  wall motion abnormalities.  2. Right ventricular systolic function is normal. The right ventricular  size is moderately enlarged.  3. Left atrial size was moderately dilated. No left atrial/left atrial  appendage thrombus was detected.  4. Right atrial size was severely dilated.  5. There was severe tricuspid regurgitation, suspect functional due to  annular dilatation. Peak RV-RA gradient 45 mmHg.  6. No PFO or ASD by color doppler.  7. The aortic valve is tricuspid. Aortic valve regurgitation is not  visualized. No aortic stenosis is present.  8. There was severe, highly eccentric, anteriorly-directed mitral  regurgitation. There was prolapse and partial flail of the P3 segment of  the posterior mitral valve. Vena contracta area 0.53 cm^2. Unable to  measure PISA due to highly eccentric jet. The  pulmonary veins on right and left showed systolic flattening but not  definite flow reversal on the pulmonary vein doppler interrogation. No  evidence of mitral stenosis. The mean mitral valve gradient is 2.0 mmHg.  9. Normal  caliber aorta with grade 3 plaque descending thoracic aorta.  10. Severe TR and MR. TR appears functional. The MR appears to be due to  leaflet abnormality primarily with P3 segment prolapse/partial flail.   .  Prox LAD lesion is 25% stenosed.  1. Minimal CAD.  2. Borderline elevated PCWP with prominent V-waves suggestive of significant mitral regurgitation.  3. Mild-moderate pulmonary venous hypertension.  4. Preserved cardiac output.     Neuro/Psych negative neurological ROS  negative psych ROS   GI/Hepatic Neg liver ROS, GERD  Medicated and Controlled,  Endo/Other  negative endocrine ROS  Renal/GU negative Renal ROSLab Results      Component                Value               Date                      CREATININE               1.05                02/23/2021                Musculoskeletal negative musculoskeletal ROS (+)   Abdominal   Peds  Hematology negative hematology ROS (+) Lab Results      Component                Value               Date  WBC                      4.1                 02/23/2021                HGB                      14.1                02/23/2021                HCT                      43.6                02/23/2021                MCV                      92.0                02/23/2021                PLT                      126 (L)             02/23/2021              Anesthesia Other Findings   Reproductive/Obstetrics                             Anesthesia Physical Anesthesia Plan  ASA: 3  Anesthesia Plan: General   Post-op Pain Management:    Induction: Intravenous  PONV Risk Score and Plan: 2 and Ondansetron  Airway Management Planned: Oral ETT  Additional Equipment: Arterial line and TEE  Intra-op Plan:   Post-operative Plan: Extubation in OR and Possible Post-op intubation/ventilation  Informed Consent: I have reviewed the patients History and Physical, chart, labs and  discussed the procedure including the risks, benefits and alternatives for the proposed anesthesia with the patient or authorized representative who has indicated his/her understanding and acceptance.     Dental advisory given  Plan Discussed with: CRNA and Anesthesiologist  Anesthesia Plan Comments:         Anesthesia Quick Evaluation

## 2021-02-26 NOTE — Progress Notes (Signed)
  Echocardiogram Echocardiogram Transesophageal has been performed.  Darlina Sicilian M 02/26/2021, 10:08 AM

## 2021-02-26 NOTE — Plan of Care (Signed)

## 2021-02-26 NOTE — Transfer of Care (Signed)
Immediate Anesthesia Transfer of Care Note  Patient: John Holder  Procedure(s) Performed: MITRAL VALVE REPAIR TRANSESOPHAGEAL ECHOCARDIOGRAM (TEE)  Patient Location: PACU  Anesthesia Type:General  Level of Consciousness: drowsy and patient cooperative  Airway & Oxygen Therapy: Patient Spontanous Breathing and Patient connected to nasal cannula oxygen  Post-op Assessment: Report given to RN and Post -op Vital signs reviewed and stable  Post vital signs: Reviewed and stable  Last Vitals:  Vitals Value Taken Time  BP 99/63 02/26/21 0950  Temp    Pulse 49 02/26/21 0950  Resp 15 02/26/21 0950  SpO2 96 % 02/26/21 0950  Vitals shown include unvalidated device data.  Last Pain:  Vitals:   02/26/21 0621  TempSrc: Oral  PainSc:          Complications: No notable events documented.

## 2021-02-27 ENCOUNTER — Inpatient Hospital Stay (HOSPITAL_COMMUNITY): Payer: Medicare PPO | Admitting: Anesthesiology

## 2021-02-27 ENCOUNTER — Inpatient Hospital Stay (HOSPITAL_COMMUNITY): Payer: Medicare PPO

## 2021-02-27 ENCOUNTER — Encounter (HOSPITAL_COMMUNITY)
Admission: RE | Disposition: A | Discharge status: Home / Self Care | Payer: Self-pay | Source: Home / Self Care | Attending: Cardiovascular Disease

## 2021-02-27 ENCOUNTER — Encounter (HOSPITAL_COMMUNITY): Payer: Self-pay | Admitting: Cardiovascular Disease

## 2021-02-27 DIAGNOSIS — Z006 Encounter for examination for normal comparison and control in clinical research program: Secondary | ICD-10-CM | POA: Diagnosis not present

## 2021-02-27 DIAGNOSIS — I340 Nonrheumatic mitral (valve) insufficiency: Secondary | ICD-10-CM

## 2021-02-27 DIAGNOSIS — I4819 Other persistent atrial fibrillation: Secondary | ICD-10-CM | POA: Diagnosis not present

## 2021-02-27 DIAGNOSIS — I083 Combined rheumatic disorders of mitral, aortic and tricuspid valves: Secondary | ICD-10-CM | POA: Diagnosis not present

## 2021-02-27 DIAGNOSIS — Z9889 Other specified postprocedural states: Secondary | ICD-10-CM

## 2021-02-27 DIAGNOSIS — Z954 Presence of other heart-valve replacement: Secondary | ICD-10-CM | POA: Diagnosis not present

## 2021-02-27 DIAGNOSIS — Z20822 Contact with and (suspected) exposure to covid-19: Secondary | ICD-10-CM | POA: Diagnosis not present

## 2021-02-27 DIAGNOSIS — Z95818 Presence of other cardiac implants and grafts: Secondary | ICD-10-CM

## 2021-02-27 DIAGNOSIS — I361 Nonrheumatic tricuspid (valve) insufficiency: Secondary | ICD-10-CM

## 2021-02-27 HISTORY — PX: TEE WITHOUT CARDIOVERSION: SHX5443

## 2021-02-27 LAB — BASIC METABOLIC PANEL
Anion gap: 5 (ref 5–15)
BUN: 19 mg/dL (ref 8–23)
CO2: 26 mmol/L (ref 22–32)
Calcium: 8.6 mg/dL — ABNORMAL LOW (ref 8.9–10.3)
Chloride: 104 mmol/L (ref 98–111)
Creatinine, Ser: 1.06 mg/dL (ref 0.61–1.24)
GFR, Estimated: >60 mL/min (ref >60)
Glucose, Bld: 125 mg/dL — ABNORMAL HIGH (ref 70–99)
Potassium: 3.9 mmol/L (ref 3.5–5.1)
Sodium: 135 mmol/L (ref 135–145)

## 2021-02-27 LAB — CBC
HCT: 38.2 % — ABNORMAL LOW (ref 39.0–52.0)
Hemoglobin: 12.4 g/dL — ABNORMAL LOW (ref 13.0–17.0)
MCH: 30.2 pg (ref 26.0–34.0)
MCHC: 32.5 g/dL (ref 30.0–36.0)
MCV: 92.9 fL (ref 80.0–100.0)
Platelets: 121 10*3/uL — ABNORMAL LOW (ref 150–400)
RBC: 4.11 MIL/uL — ABNORMAL LOW (ref 4.22–5.81)
RDW: 16.3 % — ABNORMAL HIGH (ref 11.5–15.5)
WBC: 3.2 10*3/uL — ABNORMAL LOW (ref 4.0–10.5)
nRBC: 0 % (ref 0.0–0.2)

## 2021-02-27 LAB — ECHOCARDIOGRAM LIMITED
Height: 71 in
MV VTI: 1.1 cm2
Weight: 2480 oz

## 2021-02-27 SURGERY — ECHOCARDIOGRAM, TRANSESOPHAGEAL
Anesthesia: Monitor Anesthesia Care

## 2021-02-27 MED ORDER — PROPOFOL 10 MG/ML IV BOLUS
INTRAVENOUS | Status: DC | PRN
  Administered 2021-02-27: 30 mg via INTRAVENOUS

## 2021-02-27 MED ORDER — SODIUM CHLORIDE 0.9 % IV SOLN: INTRAVENOUS | Status: DC | PRN

## 2021-02-27 MED ORDER — PROPOFOL 500 MG/50ML IV EMUL
INTRAVENOUS | Status: DC | PRN
  Administered 2021-02-27: 100 ug/kg/min via INTRAVENOUS

## 2021-02-27 NOTE — Progress Notes (Signed)
CARDIAC REHAB PHASE I   PRE:  Rate/Rhythm: 69 afib    BP: sitting 117/86    SaO2: 96 RA  MODE:  Ambulation: 410 ft   POST:  Rate/Rhythm: 101 afib    BP: sitting 134/116, recheck 149/95     SaO2: 100 RA  Pt feeling well, sts he feels more energetic today. Able to walk without problems, sts maybe he was having sx previously that he didn't notice because now he feels even better. BP elevated after walk, to recliner.   Discussed restrictions, daily wts, walking, sodium and fluid balance, and CRPII. Will refer to [facility]. He also would like to do outpatient PT to strengthen his knees and avoid surgery. [place], ACSM 02/27/2021 9:09 AM

## 2021-02-27 NOTE — Anesthesia Postprocedure Evaluation (Signed)
Anesthesia Post Note  Patient: John Holder  Procedure(s) Performed: TRANSESOPHAGEAL ECHOCARDIOGRAM (TEE)     Patient location during evaluation: PACU Anesthesia Type: MAC Level of consciousness: awake and alert and oriented Pain management: pain level controlled Vital Signs Assessment: post-procedure vital signs reviewed and stable Respiratory status: spontaneous breathing, nonlabored ventilation and respiratory function stable Cardiovascular status: stable and blood pressure returned to baseline Postop Assessment: no apparent nausea or vomiting Anesthetic complications: no   No notable events documented.  Last Vitals:  Vitals:   02/27/21 1643 02/27/21 1645  BP: 118/76 117/76  Pulse: (!) 33 (!) 48  Resp: 18 15  Temp:    SpO2: 99% 99%    Last Pain:  Vitals:   02/27/21 1645  TempSrc:   PainSc: 0-No pain   Pain Goal:                   Tryniti Laatsch A.

## 2021-02-27 NOTE — Progress Notes (Signed)
Echocardiogram Echocardiogram Transesophageal has been performed.  Oneal Deputy Hulon Ferron RDCS 02/27/2021, 4:31 PM

## 2021-02-27 NOTE — Discharge Instructions (Addendum)
Home Care Following Your MitraClip Procedure      If you have any questions or concerns you can call the structural heart office at [phone] during normal business hours 8am-4pm. If you have an urgent need after hours or on the weekend, please call [phone] to talk to the on call provider for general cardiology. If you have an emergency that requires immediate attention, please call 911.   Given your special circumstances with clip detachment, we will discuss plan over the weekend and call you early next week with finalized plan with Dr. Burt Knack and Dr. Ali Lowe.    Groin Site Care Refer to this sheet in the next few weeks. These instructions provide you with information on caring for yourself after your procedure. Your caregiver may also give you more specific instructions. Your treatment has been planned according to current medical practices, but problems sometimes occur. Call your caregiver if you have any problems or questions after your procedure. HOME CARE INSTRUCTIONS You may shower 24 hours after the procedure. Remove the bandage (dressing) and gently wash the site with plain soap and water. Gently pat the site dry.  Do not apply powder or lotion to the site.  Do not sit in a bathtub, swimming pool, or whirlpool for 5 to 7 days.  No bending, squatting, or lifting anything over 10 pounds (4.5 kg) as directed by your caregiver.  Inspect the site at least twice daily.  Do not drive home if you are discharged the same day of the procedure. Have someone else drive you.  You may drive 72 hours after the procedure unless otherwise instructed by your caregiver.  What to expect: Any bruising will usually fade within 1 to 2 weeks.  Blood that collects in the tissue (hematoma) may be painful to the touch. It should usually decrease in size and tenderness within 1 to 2 weeks.  SEEK IMMEDIATE MEDICAL CARE IF: You have unusual pain at the groin site or down the affected leg.  You have  redness, warmth, swelling, or pain at the groin site.  You have drainage (other than a small amount of blood on the dressing).  You have chills.  You have a fever or persistent symptoms for more than 72 hours.  You have a fever and your symptoms suddenly get worse.  Your leg becomes pale, cool, tingly, or numb.  You have bleeding from the site. Hold pressure on the site until it subsides.    After MitraClip Checklist  Check  Test Description   Follow up appointment in 1-2 weeks  Most of our patients will see our structural heart physician assistant, Nell Range, or your primary cardiologist within 1-2 weeks. Your incision site will be checked and you will be cleared to resume all normal activities if you are doing well.     1 month echo and follow up  You will have an echo to check on your heart valve clip and be seen back in the office by Nell Range PA-C.   Follow up with your primary cardiologist You will need to be seen by your primary cardiologist in the following 3-6 months after your 1 month appointment in the valve clinic. Often times your Plavix or Aspirin will be discontinued during this time, but this is decided on a case by case basis.    1 year echo and follow up You will have another echo to check on your heart valve after one year and be seen back in the office by Joellen Jersey  [place] Silos. This your last structural heart visit.   Bacterial endocarditis prophylaxis  You will have to take antibiotics for the rest of your life before all dental procedures (even dental cleanings) to protect your heart valve from potential infection. Antibiotics are also required before some surgeries. Please check with your cardiologist before scheduling any surgeries. Also, please make sure to tell us if you have a penicillin allergy as you will require an alternative antibiotic.    ______________  Your Implant Identification Card Following your procedure, you will receive an Implant Identification Card,  which your doctor will fill out and which you must carry with you at all times. Show your Implant Identification Card if you report to an emergency room. This card identifies you as a patient who has had a MitraClip device implanted. If you require a magnetic resonance imaging (MRI) scan, tell your doctor or MRI technician that you have a MitraClip device implanted. Test results indicate that patients with the MitraClip device can safely undergo MRI scans under certain conditions described on the card.

## 2021-02-27 NOTE — Anesthesia Postprocedure Evaluation (Signed)
Anesthesia Post Note  Patient: Guiseppe Flanagan Schwertner  Procedure(s) Performed: TRANSESOPHAGEAL ECHOCARDIOGRAM (TEE)     Patient location during evaluation: PACU Anesthesia Type: MAC Level of consciousness: awake and alert Pain management: pain level controlled Vital Signs Assessment: post-procedure vital signs reviewed and stable Respiratory status: spontaneous breathing, nonlabored ventilation and respiratory function stable Cardiovascular status: blood pressure returned to baseline and stable Postop Assessment: no apparent nausea or vomiting Anesthetic complications: no Comments: Mild hypotension in PACU, resolved w/ additional 548m crystalloid bolus    No notable events documented.  Last Vitals:  Vitals:   02/27/21 1622 02/27/21 1626  BP: 95/68 101/63  Pulse: (!) 109 76  Resp: 17 20  Temp:    SpO2: 97% 99%    Last Pain:  Vitals:   02/27/21 1626  TempSrc:   PainSc: 0-No pain                 EPervis Hocking

## 2021-02-27 NOTE — Interval H&P Note (Signed)
History and Physical Interval Note:  02/27/2021 3:39 PM  John Holder  has presented today for surgery, with the diagnosis of mitral clip off.  The various methods of treatment have been discussed with the patient and family. After consideration of risks, benefits and other options for treatment, the patient has consented to  Procedure(s): TRANSESOPHAGEAL ECHOCARDIOGRAM (TEE) (N/A) as a surgical intervention.  The patient's history has been reviewed, patient examined, no change in status, stable for surgery.  I have reviewed the patient's chart and labs.  Questions were answered to the patient's satisfaction.     Angelise Petrich [employer]

## 2021-02-27 NOTE — Anesthesia Preprocedure Evaluation (Addendum)
Anesthesia Evaluation  Patient identified by MRN, date of birth, ID band Patient awake    Reviewed: Allergy & Precautions, NPO status , Patient's Chart, lab work & pertinent test results, reviewed documented beta blocker date and time   Airway Mallampati: II  TM Distance: >3 FB Neck ROM: Full    Dental no notable dental hx. (+) Dental Advisory Given, Teeth Intact, Caps   Pulmonary sleep apnea ,    Pulmonary exam normal breath sounds clear to auscultation       Cardiovascular hypertension, Pt. on medications and Pt. on home beta blockers +CHF  + dysrhythmias Atrial Fibrillation + Valvular Problems/Murmurs MR  Rhythm:Irregular Rate:Normal + Systolic murmurs Chronic atrial fibrillation  Had mitral clip placed yesterday and clip off  Hx/o Pulmonary HTN  Echo 05/29/20 1. Post deployment and equipment removal no new pericardial effusion and stable clip position on fluorscopy.  2. Post deployment there was an increase in LAA "smoke" no thrombus and inital EF was maintatined.  3. Post transeptal there was a mild bi-directional shunt.  4. Using extensive 3D echo guidance a single NTW clip was placed medially between the A3 and P3 scallops. Leaflet capture was excellent with good 3D bridge. With full closure of clip residual MR was only 0.5/4 mild MRThe Mean gradient across the valve was only 2 mmHg at HR 80 bpm and the combined oriface area 3.1 cm2.  5. Left ventricular ejection fraction, by estimation, is 55 to 60%. The left ventricle has normal function.  6. Right ventricular systolic function is moderately reduced. The right ventricular size is moderately enlarged.  7. Left atrial size was moderately dilated. No left atrial/left atrial appendage thrombus was detected.  8. Right atrial size was moderately dilated.  9. Pre Clip: Severe eccentric anteriorly directed MR. Severe prolapse of large segment P3 scallop . The mitral valve is  abnormal. Severe mitral valve regurgitation. There is a G4 NT W Mitral Clip A3/P3 present in the  mitral position. Procedure Date:  02/16/2021.  10. Dilated Annulus . The tricuspid valve is abnormal. Tricuspid valve regurgitation is severe.  11. The aortic valve is tricuspid. Aortic valve regurgitation is not visualized. Mild aortic valve sclerosis is present, with no evidence of aortic valve stenosis.    Neuro/Psych negative neurological ROS  negative psych ROS   GI/Hepatic Neg liver ROS, GERD  Medicated and Controlled,  Endo/Other  diabetes, Well Controlled, Type 2, Oral Hypoglycemic Agents  Renal/GU S/P left nephrectomy  negative genitourinary   Musculoskeletal negative musculoskeletal ROS (+)   Abdominal   Peds  Hematology  (+) anemia , Eliquis therapy- last dose this am    Anesthesia Other Findings   Reproductive/Obstetrics                            Anesthesia Physical Anesthesia Plan  ASA: 3  Anesthesia Plan: MAC   Post-op Pain Management:    Induction: Intravenous  PONV Risk Score and Plan: 1 and Treatment may vary due to age or medical condition and Ondansetron  Airway Management Planned: Natural Airway, Simple Face Mask and Nasal Cannula  Additional Equipment:   Intra-op Plan:   Post-operative Plan:   Informed Consent: I have reviewed the patients History and Physical, chart, labs and discussed the procedure including the risks, benefits and alternatives for the proposed anesthesia with the patient or authorized representative who has indicated his/her understanding and acceptance.     Dental advisory given  Plan  Discussed with: Anesthesiologist and CRNA  Anesthesia Plan Comments:         Anesthesia Quick Evaluation

## 2021-02-27 NOTE — Plan of Care (Signed)

## 2021-02-27 NOTE — Transfer of Care (Signed)
Immediate Anesthesia Transfer of Care Note  Patient: John Holder  Procedure(s) Performed: TRANSESOPHAGEAL ECHOCARDIOGRAM (TEE)  Patient Location: PACU and Endoscopy Unit  Anesthesia Type:MAC  Level of Consciousness: awake, alert  and oriented  Airway & Oxygen Therapy: Patient Spontanous Breathing and Patient connected to nasal cannula oxygen  Post-op Assessment: Report given to RN and Post -op Vital signs reviewed and stable  Post vital signs: Reviewed and stable  Last Vitals:  Vitals Value Taken Time  BP 95/68 02/27/21 1622  Temp 36.4 C 02/27/21 1614  Pulse 96 02/27/21 1622  Resp 17 02/27/21 1622  SpO2 98 % 02/27/21 1622  Vitals shown include unvalidated device data.  Last Pain:  Vitals:   02/27/21 1622  TempSrc:   PainSc: 0-No pain         Complications: No notable events documented.

## 2021-02-27 NOTE — Discharge Summary (Addendum)
John Holder  Discharge Summary    Patient ID: John Holder MRN: [id number]; DOB: January 20, 1932  Admit date: 02/26/2021 Discharge date: 02/27/2021  Primary Care Provider: Burnard Bunting, MD  Primary Cardiologist: John Moores, MD /Dr. Burt Knack, MD/Dr. Ali Lowe, MD   Discharge Diagnoses    Principal Problem:   S/P mitral valve clip implantation Active Problems:   Symptomatic anemia   Atrial fibrillation, chronic (HCC)   HTN (hypertension)   Pulmonary hypertension ([place])   Nonrheumatic mitral (valve) insufficiency  Allergies No Known Allergies  Diagnostic Studies/Procedures    TEER 02/26/21: Successful transcatheter edge-to-edge repair of the mitral valve with a single G4 NT W device, reducing severe 4+ mitral regurgitation to mild 1+ mitral regurgitation, John Holder.   Recommend: Overnight observation, limited repeat echocardiogram tomorrow morning, resume apixaban tonight at normal dosing schedule. _____________   Echo 02/27/21: Pending final report   ------------------------ TEE 02/27/21:   Indication: Mitral regurgitation.    Findings: Please see echo section for full report.  Normal left ventricular size with EF 55-60%.  Normal wall thickness and wall motion.  Mild D-shape to the septum suggesting a degree of RV pressure/volume overload.  The right ventricle was moderately dilated with normal systolic function.  Moderate-severe left atrial enlargement with no LA appendage thrombus.  Severely dilated right atrium.  There is a small ASD in the mid septum from recent septal puncture.  There was severe tricuspid regurgitation, suspect functional due to annular dilatation and poor leaflet coaptation.  Peak RV-RA gradient 44 mmHg.  Trileaflet aortic valve with no stenosis or regurgitation.  There was severe, highly eccentric, anteriorly-directed mitral regurgitation.  There was prolapse and partial flail of the P3  segment of the posterior mitral valve.  Vena contracta area 0.66 cm^2.  Unable to measure PISA due to highly eccentric jet.  The patient is status post John placement.  The clip was in an A2/P2 orientation initially, now is only attached to the A2 segment.  It appears firmly attached.  There does appear to be space lateral to the original clip to add a new John.  Mean gradient 4 mmHg across the mitral valve.  The pulmonary veins showed systolic flattening but not definite flow reversal on the pulmonary vein doppler interrogation. Normal caliber aorta with grade 3 plaque descending thoracic aorta.    Impression: Severe, eccentric MR with prolapse and partial P3 flail.  Failed John, only attached to the A2 segment.  There appears to be space lateral to the original John for redo procedure.    History of Present Illness     John Holder is a 87 y.o. male with a history of  persistent atrial fibrillation on Eliquis, progressive symptoms of diastolic heart failure, HTN, GERD, left renal mass s/p L nephrectomy, pulmonary hypertension, and recently dx severe mitral and tricuspid regurgitation, who presented to [facility] on 02/26/21 for planned TEER.   Hospital Course   John Holder was referred to Dr. Burt Holder from Dr. Aundra Holder and Dr. Acie Holder for the evaluation of severe mitral and tricuspid regurgitation. He had been functionally independent and was quite active over the years, even playing tennis. He began developing progressive leg swelling and exertional dyspnea and was ultimately referred for TEE and cardiac catheterization to better evaluate his heart failure and valvular heart disease. TEE showed an LVEF of 55 to 60%, enlarged RV with normal systolic function, bi-atrial enlargement, severe tricuspid regurgitation with a peak RV/RA gradient of 45  mmHg, no significant aortic valve disease however there was  evidence of severe eccentric mitral regurgitation with prolapse and partial flail of the P3  segment of the posterior mitral valve leaflet. The vena contractor 0.53 cm. Pulmonary vein flow was blunted but not reversed with no mitral stenosis and a mean gradient of 2 mmHg.  Given this, he was referred to Dr. Burt Holder and was evaluated 01/29/21 and felt to have severe, stage D mitral and tricuspid regurgitation with progressive symptoms of acute on chronic diastolic heart failure, NYHA class III symptoms. As above, the TEE demonstrated Carpentier type II dysfunction with prolapse and partial flail of P2 and P3.  Both the echo and cardiac catheterization data support severe 4+ mitral regurgitation with an highly eccentric jet of mitral regurgitation.  There was no CAD on LHC. He was felt to be a good candidate for transcatheter-edge-to-edge mitral valve repair. He was ultimately referred for formal surgical consultation with Dr. Kipp Holder 01/30/21 and again felt to be a TEER versus traditional surgical candidate.   He presented to [facility] for scheduled TEER on 02/26/21 with successful transcatheter edge-to-edge repair of the mitral valve with a single G4 NT W device, reducing severe 4+ mitral regurgitation to mild 1+ mitral regurgitation, John Holder. Plan was for overnight observation, echocardiogram on day of discharge and to resume home Eliquis 5mg  PO BID the evening of his procedure.  He tolerated the procedure well. Groin site stable with no evidence of hematoma or bleeding. EKG stable. CBC with Hb at 12.4 today from 14.1 on 10/10. Will plan to recheck at OP follow up in one week with myself. Echocardiogram performed 02/27/21 post clip procedure which unfortunately showed single leaflet clip detachment. He reported feeling fine early in the morning then noticed vague chest pain which lasted approximately 5 minutes in duration. Post echo exam by Dr. Ali Holder and myself revealed a loud murmur.   He was scheduled for repeat TEE 02/27/21 with Dr. Aundra Holder prior to discharge which showed severe,  eccentric MR with prolapse and partial P3 flail. Failed John, only attached to the A2 segment however appeared to be lateral space to the original John for redo procedure. Plan is to obtain a close follow up appointment with Dr. Burt Holder next week however will need to confirm scheduling exact date and time. If able, plan to repeat procedure next week. Patient aware and willing to proceed. Once stable after TEE, patient may be discharged home and we will call him early next week with finalized plan.   Consultants: None   The patient was seen and examined by Dr. Ali Holder and felt to be stable and ready for discharge today after TEE performed, 02/27/21.  _____________  Discharge Vitals Blood pressure 117/76, pulse (!) 48, temperature (!) 97.5 F (36.4 C), temperature source Temporal, resp. rate 15, height 5\' 11"  (1.803 m), weight 70.3 kg, SpO2 99 %.  Filed Weights   02/26/21 0621  Weight: 70.3 kg   General: Well developed, well nourished, NAD Lungs:Clear to ausculation bilaterally. No wheezes, rales, or rhonchi. Breathing is unlabored. Cardiovascular: Irregularly irregular. Loud mitral murmur.  Extremities: No edema. Neuro: Alert and oriented. Psych: Responds to questions appropriately with normal affect.    Labs & Radiologic Studies    CBC Recent Labs    02/27/21 0024  WBC 3.2*  HGB 12.4*  HCT 38.2*  MCV 92.9  PLT 740*   Basic Metabolic Panel Recent Labs    02/27/21 0024  NA 135  K 3.9  CL  104  CO2 26  GLUCOSE 125*  BUN 19  CREATININE 1.06  CALCIUM 8.6*   Liver Function Tests No results for input(s): AST, ALT, ALKPHOS, BILITOT, PROT, ALBUMIN in the last 72 hours. No results for input(s): LIPASE, AMYLASE in the last 72 hours. Cardiac Enzymes No results for input(s): CKTOTAL, CKMB, CKMBINDEX, TROPONINI in the last 72 hours. BNP Invalid input(s): POCBNP D-Dimer No results for input(s): DDIMER in the last 72 hours. Hemoglobin A1C No results for input(s):  HGBA1C in the last 72 hours. Fasting Lipid Panel No results for input(s): CHOL, HDL, LDLCALC, TRIG, CHOLHDL, LDLDIRECT in the last 72 hours. Thyroid Function Tests No results for input(s): TSH, T4TOTAL, T3FREE, THYROIDAB in the last 72 hours.  Invalid input(s): FREET3 _____________  DG Chest 2 View  Result Date: 02/23/2021 CLINICAL DATA:  Mitral insufficiency. Preop for mitral valve repair. EXAM: CHEST - 2 VIEW COMPARISON:  12/31/2020 FINDINGS: Lower thoracic moderate compression deformity is similar and suboptimally evaluated. Midline trachea. moderate cardiomegaly. Mediastinal contours otherwise within normal limits. No pleural effusion or pneumothorax. No congestive failure. Clear lungs. IMPRESSION: No acute cardiopulmonary disease. Cardiomegaly without congestive failure. Electronically Signed   By: Abigail Miyamoto M.D.   On: 02/23/2021 16:52   CARDIAC CATHETERIZATION  Result Date: 02/26/2021 Successful transcatheter edge-to-edge repair of the mitral valve with a single G4 NT W device, reducing severe 4+ mitral regurgitation to mild 1+ mitral regurgitation, John Holder. Recommend: Overnight observation, limited repeat echocardiogram tomorrow morning, resume apixaban tonight at normal dosing schedule.   ECHO TEE  Result Date: 02/26/2021    TRANSESOPHOGEAL ECHO REPORT   Patient Name:   John Holder Date of Exam: 02/26/2021 Medical Rec #:  [id number]    Height:       71.0 in Accession #:    [id number]   Weight:       155.0 lb Date of Birth:  April 02, 1932    BSA:          1.892 m Patient Age:    44 years     BP:           96/62 mmHg Patient Gender: M            HR:           51 bpm. Exam Location:  Inpatient Procedure: Transesophageal Echo, 3D Echo, Cardiac Doppler and Color Doppler Indications:     Severe mitral insufficiency  History:         Patient has prior history of Echocardiogram examinations, most                  recent 01/28/2021. CHF, Pulmonary HTN, Mitral Valve Disease,                   Arrythmias:Atrial Fibrillation; Risk Factors:Hypertension and                  Sleep Apnea. GERD.                   Mitral Valve: G4 NT W Mitral Clip Holder valve is present in the                  mitral position. Procedure Date: 02/16/2021.  Sonographer:     Darlina Sicilian RDCS Referring Phys:  [place] Diagnosing Phys: Jenkins Rouge MD PROCEDURE: After discussion of the risks and benefits of a TEE, an informed consent was obtained from the patient. The patient was intubated. The transesophogeal probe  was passed without difficulty through the esophogus of the patient. Imaged were obtained with the patient in a supine position. Sedation performed by different physician. The patient was monitored while under deep sedation. Anesthestetic sedation was provided intravenously by Anesthesiology: 110mg  of Propofol, 60mg  of Lidocaine. Image quality was good. The patient's vital signs; including heart rate, blood pressure, and oxygen saturation; remained stable throughout the procedure. The patient developed no complications during the procedure. IMPRESSIONS  1. Post deployment and equipment removal no new pericardial effusion and stable clip position on fluorscopy.  2. Post deployment there was an increase in LAA "smoke" no thrombus and inital EF was maintatined.  3. Post transeptal there was a mild bi-directional shunt.  4. Using extensive 3D echo guidance a single NTW clip was placed medially between the A3 and P3 scallops. Leaflet capture was excellent with good 3D bridge. With full closure of clip residual MR was only 0.5/4 mild MRThe Mean gradient across the valve was only 2 mmHg at HR 80 bpm and the combined oriface area 3.1 cm2.  5. Left ventricular ejection fraction, by estimation, is 55 to 60%. The left ventricle has normal function.  6. Right ventricular systolic function is moderately reduced. The right ventricular size is moderately enlarged.  7. Left atrial size was moderately dilated. No  left atrial/left atrial appendage thrombus was detected.  8. Right atrial size was moderately dilated.  9. Pre Clip: Severe eccentric anteriorly directed MR. Severe prolapse of large segment P3 scallop . The mitral valve is abnormal. Severe mitral valve regurgitation. There is a G4 NT W Mitral Clip Holder present in the mitral position. Procedure Date: 02/16/2021. 10. Dilated Annulus . The tricuspid valve is abnormal. Tricuspid valve regurgitation is severe. 11. The aortic valve is tricuspid. Aortic valve regurgitation is not visualized. Mild aortic valve sclerosis is present, with no evidence of aortic valve stenosis. FINDINGS  Left Ventricle: Left ventricular ejection fraction, by estimation, is 55 to 60%. The left ventricle has normal function. The left ventricular internal cavity size was normal in size. There is no left ventricular hypertrophy. Right Ventricle: The right ventricular size is moderately enlarged. Right vetricular wall thickness was not assessed. Right ventricular systolic function is moderately reduced. Left Atrium: Left atrial size was moderately dilated. No left atrial/left atrial appendage thrombus was detected. Right Atrium: Right atrial size was moderately dilated. Pericardium: There is no evidence of pericardial effusion. Mitral Valve: Pre Clip: Severe eccentric anteriorly directed MR. Severe prolapse of large segment P3 scallop. The mitral valve is abnormal. Severe mitral valve regurgitation. There is a G4 NT W Mitral Clip Holder present in the mitral position. Procedure Date: 02/16/2021. MV peak gradient, 6.5 mmHg. The mean mitral valve gradient is 2.0 mmHg. Tricuspid Valve: Dilated Annulus. The tricuspid valve is abnormal. Tricuspid valve regurgitation is severe. Aortic Valve: The aortic valve is tricuspid. Aortic valve regurgitation is not visualized. Mild aortic valve sclerosis is present, with no evidence of aortic valve stenosis. Pulmonic Valve: The pulmonic valve was normal in  structure. Pulmonic valve regurgitation is mild. Aorta: The aortic root is normal in size and structure. IAS/Shunts: No atrial level shunt detected by color flow Doppler. Additional Comments: Using extensive 3D echo guidance a single NTW clip was placed medially between the A3 and P3 scallops. Leaflet capture was excellent with good 3D bridge. With full closure of clip residual MR was only 0.5/4 mild MRThe Mean gradient across the valve was only 2 mmHg at HR 80 bpm and the combined oriface  area 3.1 cm2. Post transeptal there was a mild bi-directional shunt. Post deployment there was an increase in LAA "smoke" no thrombus and inital EF was maintatined. Post deployment and equipment removal no new pericardial effusion and stable clip position on fluorscopy.  LEFT VENTRICLE PLAX 2D LVOT diam:     1.90 cm LVOT Area:     2.84 cm  MITRAL VALVE                  TRICUSPID VALVE MV Peak grad: 6.5 mmHg        TR Peak grad:   51.3 mmHg MV Mean grad: 2.0 mmHg        TR Vmax:        358.00 cm/s MV Vmax:      1.27 m/s MV Vmean:     62.2 cm/s       SHUNTS MR Peak grad:    82.3 mmHg    Systemic Diam: 1.90 cm MR Mean grad:    55.0 mmHg MR Vmax:         453.50 cm/s MR Vmean:        355.0 cm/s MR PISA:         19.24 cm MR PISA Eff ROA: 182 mm MR PISA Radius:  1.75 cm Jenkins Rouge MD Electronically signed by Jenkins Rouge MD Signature Date/Time: 02/26/2021/12:52:27 PM    Final    Disposition   Pt is being discharged home today in good condition.  Follow-up Plans & Appointments    Follow-up Information     Tommie Raymond, NP Follow up on 03/05/2021.   Specialty: Cardiology Why: at 1:00 PM. Please arrive by 12:45pm Contact information: [place] [id number] [phone]                Discharge Medications   Allergies as of 02/27/2021   No Known Allergies      Medication List     TAKE these medications    ALPRAZolam 0.25 MG tablet Commonly known as: XANAX Take 1 tablet  (0.25 mg total) by mouth every 6 (six) hours as needed for anxiety   BERBERINE COMPLEX PO Take 2 capsules by mouth daily.   Chromium 400 MCG Tabs Take 400 mcg by mouth daily.   ciclopirox 8 % solution Commonly known as: PENLAC Apply 1 application topically at bedtime as needed (toe nail fungus).   Co Q10 200 MG Caps Take 200 mg by mouth daily.   Eliquis 5 MG Tabs tablet Generic drug: apixaban TAKE 1 TABLET BY MOUTH TWICE DAILY   empagliflozin 10 MG Tabs tablet Commonly known as: Jardiance Take 1 tablet (10 mg total) by mouth daily before breakfast.   ferrous gluconate 324 MG tablet Commonly known as: FERGON Take 324 mg by mouth daily with breakfast.   furosemide 40 MG tablet Commonly known as: LASIX Take 1 tablet (40 mg total) by mouth daily.   glucosamine-chondroitin 500-400 MG tablet Take 1 tablet by mouth 2 (two) times daily.   KRILL OIL PO Take 1 capsule by mouth daily at 12 noon.   Linoleic Acid Conjugated 1000 MG Caps Take 1,000 mg by mouth in the morning and at bedtime.   losartan 50 MG tablet Commonly known as: COZAAR TAKE 1 TABLET BY MOUTH EVERY DAY What changed: how much to take   Magnesium Glycinate 665 MG Caps Take 665 mg by mouth in the morning and at bedtime.   melatonin 5 MG Tabs Take 5  mg by mouth at bedtime.   metoprolol succinate 50 MG 24 hr tablet Commonly known as: TOPROL-XL TAKE 1 TABLET (50 MG TOTAL) BY MOUTH 2 (TWO) TIMES DAILY. TAKE WITH OR IMMEDIATELY FOLLOWING A MEAL. What changed:  how much to take how to take this when to take this   multivitamin capsule Take 2 capsules by mouth daily.   naproxen sodium 220 MG tablet Commonly known as: ALEVE Take 220 mg by mouth 2 (two) times daily as needed (pain/headache).   NON FORMULARY Take 1 capsule by mouth 2 (two) times daily. TRU vitamin   omeprazole 20 MG capsule Commonly known as: PRILOSEC Take 1 capsule (20 mg total) by mouth daily.   OVER THE COUNTER MEDICATION Take 2  capsules by mouth daily. Prostacare   OVER THE COUNTER MEDICATION Take 2 capsules by mouth daily. Uricare   potassium chloride SA 20 MEQ tablet Commonly known as: KLOR-CON Take 2 tablets (40 mEq total) by mouth daily.   PREVAGEN EXTRA STRENGTH PO Take 1 tablet by mouth daily.   tadalafil 20 MG tablet Commonly known as: CIALIS TAKE ONE TABLET BY MOUTH DAILY AS NEEDED What changed: reasons to take this        Outstanding Labs/Studies   CBC  Duration of Discharge Encounter   Greater than 30 minutes including physician time.  Signed, Kathyrn Drown, NP 02/27/2021, 5:50 PM [phone]   ATTENDING ATTESTATION:  After conducting a review of all available clinical information with the care Holder, interviewing the patient, and performing a physical exam, I agree with the findings and plan described in this note.  Patient referred for mitral TEER which was initially successful but then developed single leaflet detachment.  Preliminary review suggests that future attempt at TEER is feasible.  Stable for discharge with close follow up with an eye towards repeat intervention.  Lenna Sciara, MD Pager [phone]

## 2021-02-27 NOTE — Progress Notes (Signed)
Echocardiogram 2D Echocardiogram has been performed.  Oneal Deputy Taylin Mans RDCS 02/27/2021, 10:15 AM  Drs Gasper Sells and Aundra Dubin notified

## 2021-02-27 NOTE — CV Procedure (Addendum)
Procedure: TEE  Sedation: Per anesthesiology  Indication: Mitral regurgitation.   Findings: Please see echo section for full report.  Normal left ventricular size with EF 55-60%.  Normal wall thickness and wall motion.  Mild D-shape to the septum suggesting a degree of RV pressure/volume overload.  The right ventricle was moderately dilated with normal systolic function.  Moderate-severe left atrial enlargement with no LA appendage thrombus.  Severely dilated right atrium.  There is a small ASD in the mid septum from recent septal puncture.  There was severe tricuspid regurgitation, suspect functional due to annular dilatation and poor leaflet coaptation.  Peak RV-RA gradient 44 mmHg.  Trileaflet aortic valve with no stenosis or regurgitation.  There was severe, highly eccentric, anteriorly-directed mitral regurgitation.  There was prolapse and partial flail of the P3 segment of the posterior mitral valve.  Vena contracta area 0.66 cm^2.  Unable to measure PISA due to highly eccentric jet.  The patient is status post Mitraclip placement.  The clip was in an A2/P2 orientation initially, now is only attached to the A2 segment.  It appears firmly attached.  There does appear to be space lateral to the original clip to add a new Mitraclip.  Mean gradient 4 mmHg across the mitral valve.  The pulmonary veins showed systolic flattening but not definite flow reversal on the pulmonary vein doppler interrogation. Normal caliber aorta with grade 3 plaque descending thoracic aorta.   Impression: Severe, eccentric MR with prolapse and partial P3 flail.  Failed Mitraclip, only attached to the A2 segment.  There appears to be space lateral to the original Mitraclip for redo procedure.   Loralie Champagne 02/27/2021 4:20 PM

## 2021-03-01 ENCOUNTER — Encounter (HOSPITAL_COMMUNITY): Payer: Self-pay | Admitting: Cardiology

## 2021-03-02 DIAGNOSIS — T1501XA Foreign body in cornea, right eye, initial encounter: Secondary | ICD-10-CM | POA: Diagnosis not present

## 2021-03-03 NOTE — Anesthesia Postprocedure Evaluation (Signed)
Anesthesia Post Note  Patient: John Holder  Procedure(s) Performed: MITRAL VALVE REPAIR TRANSESOPHAGEAL ECHOCARDIOGRAM (TEE)     Patient location during evaluation: Cath Lab Anesthesia Type: General Level of consciousness: awake and alert Pain management: pain level controlled Vital Signs Assessment: post-procedure vital signs reviewed and stable Respiratory status: spontaneous breathing, nonlabored ventilation, respiratory function stable and patient connected to nasal cannula oxygen Cardiovascular status: blood pressure returned to baseline and stable Postop Assessment: no apparent nausea or vomiting Anesthetic complications: no   No notable events documented.  Last Vitals:  Vitals:   02/27/21 1643 02/27/21 1645  BP: 118/76 117/76  Pulse: (!) 33 (!) 48  Resp: 18 15  Temp:    SpO2: 99% 99%    Last Pain:  Vitals:   02/27/21 1645  TempSrc:   PainSc: 0-No pain                 Giana Castner

## 2021-03-04 ENCOUNTER — Telehealth (HOSPITAL_COMMUNITY): Payer: Self-pay

## 2021-03-04 DIAGNOSIS — I272 Pulmonary hypertension, unspecified: Secondary | ICD-10-CM | POA: Diagnosis not present

## 2021-03-04 DIAGNOSIS — Z0000 Encounter for general adult medical examination without abnormal findings: Secondary | ICD-10-CM | POA: Diagnosis not present

## 2021-03-04 DIAGNOSIS — Z1331 Encounter for screening for depression: Secondary | ICD-10-CM | POA: Diagnosis not present

## 2021-03-04 DIAGNOSIS — R972 Elevated prostate specific antigen [PSA]: Secondary | ICD-10-CM | POA: Diagnosis not present

## 2021-03-04 DIAGNOSIS — I482 Chronic atrial fibrillation, unspecified: Secondary | ICD-10-CM | POA: Diagnosis not present

## 2021-03-04 DIAGNOSIS — I10 Essential (primary) hypertension: Secondary | ICD-10-CM | POA: Diagnosis not present

## 2021-03-04 DIAGNOSIS — R82998 Other abnormal findings in urine: Secondary | ICD-10-CM | POA: Diagnosis not present

## 2021-03-04 DIAGNOSIS — E785 Hyperlipidemia, unspecified: Secondary | ICD-10-CM | POA: Diagnosis not present

## 2021-03-04 DIAGNOSIS — I509 Heart failure, unspecified: Secondary | ICD-10-CM | POA: Diagnosis not present

## 2021-03-04 DIAGNOSIS — Z23 Encounter for immunization: Secondary | ICD-10-CM | POA: Diagnosis not present

## 2021-03-04 DIAGNOSIS — M1990 Unspecified osteoarthritis, unspecified site: Secondary | ICD-10-CM | POA: Diagnosis not present

## 2021-03-04 DIAGNOSIS — Z1389 Encounter for screening for other disorder: Secondary | ICD-10-CM | POA: Diagnosis not present

## 2021-03-04 NOTE — Telephone Encounter (Signed)
Pt insurance is active and benefits verified through [facility] $20, DED 0/0 met, out of pocket $4,000/$1,121.48 met, co-insurance 0%. no pre-authorization required. Passport, 03/04/2021'@11' :24am, REF# [id number]   Will contact patient to see if he is interested in the Cardiac Rehab Program. If interested, patient will need to complete follow up appt. Once completed, patient will be contacted for scheduling upon review by the RN Navigator.

## 2021-03-04 NOTE — Telephone Encounter (Signed)
Attempted to call patient in regards to Cardiac Rehab - LM on VM 

## 2021-03-04 NOTE — Telephone Encounter (Signed)
Pt called back and stated that he is interested in the cardiac rehab program, went over scheduling and insurance benefits. Will call back after f/u to schedule.

## 2021-03-05 ENCOUNTER — Ambulatory Visit: Payer: Medicare PPO | Admitting: Cardiovascular Disease

## 2021-03-05 ENCOUNTER — Encounter: Payer: Self-pay | Admitting: Cardiovascular Disease

## 2021-03-05 ENCOUNTER — Ambulatory Visit: Payer: Medicare PPO | Admitting: Cardiology

## 2021-03-05 ENCOUNTER — Other Ambulatory Visit (HOSPITAL_COMMUNITY): Payer: Self-pay

## 2021-03-05 ENCOUNTER — Other Ambulatory Visit: Payer: Self-pay

## 2021-03-05 VITALS — BP 112/70 | HR 80 | Ht 71.0 in | Wt 165.2 lb

## 2021-03-05 DIAGNOSIS — I340 Nonrheumatic mitral (valve) insufficiency: Secondary | ICD-10-CM

## 2021-03-05 NOTE — Patient Instructions (Signed)
Medication Instructions:  Your physician recommends that you continue on your current medications as directed. Please refer to the Current Medication list given to you today.  *If you need a refill on your cardiac medications before your next appointment, please call your pharmacy*   Lab Work: None ordered   If you have labs (blood work) drawn today and your tests are completely normal, you will receive your results only by: [place] (if you have MyChart) OR A paper copy in the mail If you have any lab test that is abnormal or we need to change your treatment, we will call you to review the results.   Testing/Procedures: None ordered    Follow-Up: Call our office once you make your decision [phone]  Here are are some future dates 03/19/21 and 04/23/21    Other Instructions None

## 2021-03-05 NOTE — H&P (View-Only) (Signed)
Cardiology Office Note:    Date:  03/05/2021   ID:  KHUP SAPIA, DOB 05-21-1931, MRN [id number]  PCP:  Burnard Bunting, MD   [facility] Providers Cardiologist:  Mertie Moores, MD    Referring MD: Burnard Bunting, MD   Chief Complaint  Patient presents with   Shortness of Breath     History of Present Illness:    John Holder is a 87 y.o. male presenting for follow-up of severe mitral regurgitation.  The patient has a history of chronic persistent atrial fibrillation, worsening diastolic heart failure, pulmonary hypertension, and severe mitral and tricuspid regurgitation.  LVEF has been in the range of 55 to 60%.  The patient has severe prolapse and partial flail of the P3 segment of the posterior mitral valve leaflet.  He was initially evaluated January 29, 2021 when he underwent transcatheter edge-to-edge repair of the mitral valve February 26, 2021.  A single MitraClip NTW device was placed and the patient initially had excellent reduction in mitral regurgitation.  However, the following morning, he was noted to have a recurrent loud heart murmur after initially achieving complete resolution of his heart murmur.  His postoperative day #1 echo demonstrated findings consistent with single leaflet device attachment to the anterior leaflet and recurrent severe mitral regurgitation.  He underwent transesophageal echo that confirmed this finding with the MitraClip device attached only to the A2 segment of the anterior leaflet.  The patient presents today for further discussion of potential treatment options.  He is here with his significant other.  The patient continues to experience symptoms of exertional dyspnea and fatigue.  He has no resting symptoms and denies orthopnea, PND, or leg edema.  He has no chest pain or pressure.  He recalls feeling really well when he woke up in the morning after his MitraClip procedure.  His symptoms have returned now back to baseline.  Past Medical  History:  Diagnosis Date   Blood transfusion without reported diagnosis    Cataract    removed in replace bilateral   CHF (congestive heart failure) (HCC)    Dysrhythmia    GERD (gastroesophageal reflux disease)    Hypertension    Iron deficiency anemia    amdx 08/2018 >> Tx with PRBCs and Feraheme started; no GI source per workup; followed by Heme (Dr. Jana Hakim) and GI (Dr. Cristina Gong)    Left renal mass    s/p L nephrectomy   Mitral regurgitation    Echo 02/2016:  Mild conc LVH, EF 55-60, mild to mod MR, mod LAE, mod to severe RAE, mod TR, PASP 41 // Echo 10/2018: EF 60-65, mod conc LVH, severe BAE, trivial eff, mild MVP with mod MR, mod to severe TR, asc aorta 36 (mild dilation)    Persistent Atrial Fibrillation    Pulmonary hypertension (HCC)    Echocardiogram 7/22: EF 60-65, no RWMA, normal RVSF, RVSP 70.7 (severe pulmonary hypertension), severe BAE, mod MR, severe TR, mild to mod AV sclerosis w/o AS   S/P mitral valve clip implantation 02/26/2021   G4 NT W device; A3/P3 with Dr. Burt Knack   Sleep apnea     Past Surgical History:  Procedure Laterality Date   CARDIOVERSION N/A 02/04/2016   Procedure: CARDIOVERSION;  Surgeon: Thayer Headings, MD;  Location: [facility];  Service: Cardiovascular;  Laterality: N/A;   CHOLECYSTECTOMY     COLONOSCOPY     COLONOSCOPY WITH PROPOFOL N/A 09/10/2018   Procedure: COLONOSCOPY WITH PROPOFOL;  Surgeon: Ronald Lobo, MD;  Location:  [facility];  Service: Endoscopy;  Laterality: N/A;   ESOPHAGOGASTRODUODENOSCOPY (EGD) WITH PROPOFOL N/A 09/10/2018   Procedure: ESOPHAGOGASTRODUODENOSCOPY (EGD) WITH PROPOFOL;  Surgeon: Ronald Lobo, MD;  Location: [facility];  Service: Endoscopy;  Laterality: N/A;   EYE SURGERY     GIVENS CAPSULE STUDY N/A 09/10/2018   Procedure: GIVENS CAPSULE STUDY;  Surgeon: Ronald Lobo, MD;  Location: [facility];  Service: Endoscopy;  Laterality: N/A;   MITRAL VALVE REPAIR N/A 02/26/2021   Procedure: MITRAL VALVE  REPAIR;  Surgeon: [name], MD;  Location: [place] [facility];  Service: Cardiovascular;  Laterality: N/A;   NEPHRECTOMY Left 2002   RIGHT/LEFT HEART CATH AND CORONARY ANGIOGRAPHY N/A 01/27/2021   Procedure: RIGHT/LEFT HEART CATH AND CORONARY ANGIOGRAPHY;  Surgeon: Larey Dresser, MD;  Location: [place] [facility];  Service: Cardiovascular;  Laterality: N/A;   TEE WITHOUT CARDIOVERSION N/A 01/27/2021   Procedure: TRANSESOPHAGEAL ECHOCARDIOGRAM (TEE);  Surgeon: Larey Dresser, MD;  Location: [facility]COPY;  Service: Cardiovascular;  Laterality: N/A;   TEE WITHOUT CARDIOVERSION N/A 02/26/2021   Procedure: TRANSESOPHAGEAL ECHOCARDIOGRAM (TEE);  Surgeon: [name], MD;  Location: [place] [facility];  Service: Cardiovascular;  Laterality: N/A;   TEE WITHOUT CARDIOVERSION N/A 02/27/2021   Procedure: TRANSESOPHAGEAL ECHOCARDIOGRAM (TEE);  Surgeon: Larey Dresser, MD;  Location: [facility] ENDOSCOPY;  Service: Cardiovascular;  Laterality: N/A;   TONSILLECTOMY      Current Medications: Current Meds  Medication Sig   ALPRAZolam (XANAX) 0.25 MG tablet Take 1 tablet (0.25 mg total) by mouth every 6 (six) hours as needed for anxiety   apixaban (ELIQUIS) 5 MG TABS tablet TAKE 1 TABLET BY MOUTH TWICE DAILY   Apoaequorin (PREVAGEN EXTRA STRENGTH PO) Take 1 tablet by mouth daily.   Barberry-Oreg Grape-Goldenseal (BERBERINE COMPLEX PO) Take 2 capsules by mouth daily.   Chromium 400 MCG TABS Take 400 mcg by mouth daily.   ciclopirox (PENLAC) 8 % solution Apply 1 application topically at bedtime as needed (toe nail fungus).   Coenzyme Q10 (CO Q10) 200 MG CAPS Take 200 mg by mouth daily.   empagliflozin (JARDIANCE) 10 MG TABS tablet Take 1 tablet (10 mg total) by mouth daily before breakfast.   ferrous gluconate (FERGON) 324 MG tablet Take 324 mg by mouth daily with breakfast.   furosemide (LASIX) 40 MG tablet Take 1 tablet (40 mg total) by mouth daily.   glucosamine-chondroitin 500-400 MG tablet Take  1 tablet by mouth 2 (two) times daily.    KRILL OIL PO Take 1 capsule by mouth daily at 12 noon.   Linoleic Acid Conjugated 1000 MG CAPS Take 1,000 mg by mouth in the morning and at bedtime.   losartan (COZAAR) 50 MG tablet TAKE 1 TABLET BY MOUTH EVERY DAY (Patient taking differently: Take 50 mg by mouth daily.)   Magnesium Glycinate 665 MG CAPS Take 665 mg by mouth in the morning and at bedtime.   Melatonin 5 MG TABS Take 5 mg by mouth at bedtime.   metoprolol succinate (TOPROL-XL) 50 MG 24 hr tablet TAKE 1 TABLET (50 MG TOTAL) BY MOUTH 2 (TWO) TIMES DAILY. TAKE WITH OR IMMEDIATELY FOLLOWING A MEAL. (Patient taking differently: Take 50 mg by mouth in the morning and at bedtime.)   Multiple Vitamin (MULTIVITAMIN) capsule Take 2 capsules by mouth daily.   naproxen sodium (ALEVE) 220 MG tablet Take 220 mg by mouth 2 (two) times daily as needed (pain/headache).   NON FORMULARY Take 1 capsule by mouth 2 (two) times daily.  TRU vitamin   omeprazole (PRILOSEC) 20 MG capsule Take 1 capsule (20 mg total) by mouth daily.   OVER THE COUNTER MEDICATION Take 2 capsules by mouth daily. [facility]   OVER THE COUNTER MEDICATION Take 2 capsules by mouth daily. [employer]   potassium chloride SA (KLOR-CON) 20 MEQ tablet Take 2 tablets (40 mEq total) by mouth daily.   tadalafil (CIALIS) 20 MG tablet TAKE ONE TABLET BY MOUTH DAILY AS NEEDED (Patient taking differently: Take 20 mg by mouth daily as needed for erectile dysfunction.)     Allergies:   Patient has no known allergies.   Social History   Socioeconomic History   Marital status: Widowed    Spouse name: Not on file   Number of children: 3   Years of education: Not on file   Highest education level: Not on file  Occupational History   Occupation: judge, Chief Executive Officer, professor    Comment: retired from all 3  Tobacco Use   Smoking status: Never   Smokeless tobacco: Never  Vaping Use   Vaping Use: Never used  Substance and Sexual Activity   Alcohol use:  Yes    Alcohol/week: 0.0 standard drinks    Comment: occasional   Drug use: No   Sexual activity: Not on file  Other Topics Concern   Not on file  Social History Narrative   Not on file   Social Determinants of Health   Financial Resource Strain: Not on file  Food Insecurity: Not on file  Transportation Needs: Not on file  Physical Activity: Not on file  Stress: Not on file  Social Connections: Not on file     Family History: The patient's family history includes Breast cancer in his mother; Hypertension in his father. There is no history of Diabetes, Stroke, CAD, Colon cancer, Colon polyps, Esophageal cancer, Rectal cancer, or Stomach cancer.  ROS:   Please see the history of present illness.    All other systems reviewed and are negative.  EKGs/Labs/Other Studies Reviewed:    The following studies were reviewed today: TEE (02/27/2021):   Findings: Please see echo section for full report.  Normal left ventricular size with EF 55-60%.  Normal wall thickness and wall motion.  Mild D-shape to the septum suggesting a degree of RV pressure/volume overload.  The right ventricle was moderately dilated with normal systolic function.  Moderate-severe left atrial enlargement with no LA appendage thrombus.  Severely dilated right atrium.  There is a small ASD in the mid septum from recent septal puncture.  There was severe tricuspid regurgitation, suspect functional due to annular dilatation and poor leaflet coaptation.  Peak RV-RA gradient 44 mmHg.  Trileaflet aortic valve with no stenosis or regurgitation.  There was severe, highly eccentric, anteriorly-directed mitral regurgitation.  There was prolapse and partial flail of the P3 segment of the posterior mitral valve.  Vena contracta area 0.66 cm^2.  Unable to measure PISA due to highly eccentric jet.  The patient is status post Mitraclip placement.  The clip was in an A2/P2 orientation initially, now is only attached to the A2 segment.  It  appears firmly attached.  There does appear to be space lateral to the original clip to add a new Mitraclip.  Mean gradient 4 mmHg across the mitral valve.  The pulmonary veins showed systolic flattening but not definite flow reversal on the pulmonary vein doppler interrogation. Normal caliber aorta with grade 3 plaque descending thoracic aorta.    Impression: Severe, eccentric MR with prolapse  and partial P3 flail.  Failed Mitraclip, only attached to the A2 segment.  There appears to be space lateral to the original Mitraclip for redo procedure.   Recent Labs: 11/26/2020: NT-Pro BNP 1,404; TSH 2.950 02/18/2021: B Natriuretic Peptide 381.0 02/23/2021: ALT 23 02/27/2021: BUN 19; Creatinine, Ser 1.06; Hemoglobin 12.4; Platelets 121; Potassium 3.9; Sodium 135  Recent Lipid Panel    Component Value Date/Time   TRIG 31 09/08/2018 1556    Risk Assessment/Calculations:    CHA2DS2-VASc Score = 5   This indicates a 7.2% annual risk of stroke. The patient's score is based upon: CHF History: 1 HTN History: 1 Diabetes History: 0 Stroke History: 0 Vascular Disease History: 1 Age Score: 2 Gender Score: 0          Physical Exam:    VS:  BP 112/70   Pulse 80   Ht 5\' 11"  (1.803 m)   Wt 165 lb 3.2 oz (74.9 kg)   SpO2 100%   BMI 23.04 kg/m     Wt Readings from Last 3 Encounters:  03/05/21 165 lb 3.2 oz (74.9 kg)  02/26/21 155 lb (70.3 kg)  02/23/21 162 lb 3.2 oz (73.6 kg)     GEN:  Well nourished, well developed in no acute distress HEENT: Normal NECK: No JVD; No carotid bruits LYMPHATICS: No lymphadenopathy CARDIAC: RRR, 3/6 holosystolic murmur at the apex RESPIRATORY:  Clear to auscultation without rales, wheezing or rhonchi  ABDOMEN: Soft, non-tender, non-distended MUSCULOSKELETAL:  trace bilateral pretibial edema; No deformity  SKIN: Warm and dry NEUROLOGIC:  Alert and oriented x 3 PSYCHIATRIC:  Normal affect   ASSESSMENT:    1. Severe mitral insufficiency    PLAN:     In order of problems listed above:  The patient has severe nonrheumatic primary mitral regurgitation secondary to severe prolapse and flail of the P3 segment of the posterior leaflet.  He underwent transcatheter edge-to-edge repair of the mitral valve with single leaflet device dettachment (SLDA) the day following the procedure. The previously implanted MitraClip device is attached to the A2 scallop. He has NYHA class II symptoms. Treatment options include palliative medical therapy, repeat edge-to-edge repair of the mitral valve with positioning of the device lateral to the old clip on the A3/P3 segment, or high risk conventional heart surgery.  I reviewed the pros and cons of each of these approaches with the patient today.  I do not think he will do well with conservative management as he is already developed RV dilatation, pulmonary hypertension, and symptomatic mitral regurgitation.  At the time of his MitraClip procedure, his baseline left atrial V wave was about 50 mmHg.  We discussed technical considerations around repeat transcatheter edge-to-edge repair of the mitral valve.  He understands that we would try to place a clip medial to the current clip in order to both reduce his mitral regurgitation and stabilize the clip that is attached to the anterior leaflet.  I offered him treatment again at our center versus referral to a tertiary center such as [facility] in [place] where they have a very experienced structural heart team.  In addition, we discussed the option of high risk surgery, possibly via a minithoracotomy approach.  He understands that I would likely refer him to Dr Cheree Ditto at [facility] if he decides to consider this. We ultimately agreed that he would like to think further about his options. I advised him that I will send images of his baseline TEE, intraop TEE, and POD #1  TEE to Dr Bridgette Habermann at [facility] for further review. I will touch base with him in the near future after  I hear back from Dr Bridgette Habermann.         Medication Adjustments/Labs and Tests Ordered: Current medicines are reviewed at length with the patient today.  Concerns regarding medicines are outlined above.  No orders of the defined types were placed in this encounter.  No orders of the defined types were placed in this encounter.   Patient Instructions  Medication Instructions:  Your physician recommends that you continue on your current medications as directed. Please refer to the Current Medication list given to you today.  *If you need a refill on your cardiac medications before your next appointment, please call your pharmacy*   Lab Work: None ordered   If you have labs (blood work) drawn today and your tests are completely normal, you will receive your results only by: [place] (if you have MyChart) OR A paper copy in the mail If you have any lab test that is abnormal or we need to change your treatment, we will call you to review the results.   Testing/Procedures: None ordered    Follow-Up: Call our office once you make your decision [phone]  Here are are some future dates 03/19/21 and 04/23/21    Other Instructions None    Signed, [name], MD  03/05/2021 3:13 PM    [facility]

## 2021-03-05 NOTE — Progress Notes (Addendum)
Cardiology Office Note:    Date:  03/05/2021   ID:  BROCK LARMON, DOB 07-07-1931, MRN [id number]  PCP:  Burnard Bunting, MD   [facility] Providers Cardiologist:  Mertie Moores, MD    Referring MD: Burnard Bunting, MD   Chief Complaint  Patient presents with   Shortness of Breath     History of Present Illness:    MANFORD SPRONG is a 87 y.o. male presenting for follow-up of severe mitral regurgitation.  The patient has a history of chronic persistent atrial fibrillation, worsening diastolic heart failure, pulmonary hypertension, and severe mitral and tricuspid regurgitation.  LVEF has been in the range of 55 to 60%.  The patient has severe prolapse and partial flail of the P3 segment of the posterior mitral valve leaflet.  He was initially evaluated January 29, 2021 when he underwent transcatheter edge-to-edge repair of the mitral valve February 26, 2021.  A single MitraClip NTW device was placed and the patient initially had excellent reduction in mitral regurgitation.  However, the following morning, he was noted to have a recurrent loud heart murmur after initially achieving complete resolution of his heart murmur.  His postoperative day #1 echo demonstrated findings consistent with single leaflet device attachment to the anterior leaflet and recurrent severe mitral regurgitation.  He underwent transesophageal echo that confirmed this finding with the MitraClip device attached only to the A2 segment of the anterior leaflet.  The patient presents today for further discussion of potential treatment options.  He is here with his significant other.  The patient continues to experience symptoms of exertional dyspnea and fatigue.  He has no resting symptoms and denies orthopnea, PND, or leg edema.  He has no chest pain or pressure.  He recalls feeling really well when he woke up in the morning after his MitraClip procedure.  His symptoms have returned now back to baseline.  Past Medical  History:  Diagnosis Date   Blood transfusion without reported diagnosis    Cataract    removed in replace bilateral   CHF (congestive heart failure) (HCC)    Dysrhythmia    GERD (gastroesophageal reflux disease)    Hypertension    Iron deficiency anemia    amdx 08/2018 >> Tx with PRBCs and Feraheme started; no GI source per workup; followed by Heme (Dr. Jana Hakim) and GI (Dr. Cristina Gong)    Left renal mass    s/p L nephrectomy   Mitral regurgitation    Echo 02/2016:  Mild conc LVH, EF 55-60, mild to mod MR, mod LAE, mod to severe RAE, mod TR, PASP 41 // Echo 10/2018: EF 60-65, mod conc LVH, severe BAE, trivial eff, mild MVP with mod MR, mod to severe TR, asc aorta 36 (mild dilation)    Persistent Atrial Fibrillation    Pulmonary hypertension (HCC)    Echocardiogram 7/22: EF 60-65, no RWMA, normal RVSF, RVSP 70.7 (severe pulmonary hypertension), severe BAE, mod MR, severe TR, mild to mod AV sclerosis w/o AS   S/P mitral valve clip implantation 02/26/2021   G4 NT W device; A3/P3 with Dr. Burt Knack   Sleep apnea     Past Surgical History:  Procedure Laterality Date   CARDIOVERSION N/A 02/04/2016   Procedure: CARDIOVERSION;  Surgeon: Thayer Headings, MD;  Location: [place];  Service: Cardiovascular;  Laterality: N/A;   CHOLECYSTECTOMY     COLONOSCOPY     COLONOSCOPY WITH PROPOFOL N/A 09/10/2018   Procedure: COLONOSCOPY WITH PROPOFOL;  Surgeon: Ronald Lobo, MD;  Location:  [facility];  Service: Endoscopy;  Laterality: N/A;   ESOPHAGOGASTRODUODENOSCOPY (EGD) WITH PROPOFOL N/A 09/10/2018   Procedure: ESOPHAGOGASTRODUODENOSCOPY (EGD) WITH PROPOFOL;  Surgeon: Ronald Lobo, MD;  Location: [facility];  Service: Endoscopy;  Laterality: N/A;   EYE SURGERY     GIVENS CAPSULE STUDY N/A 09/10/2018   Procedure: GIVENS CAPSULE STUDY;  Surgeon: Ronald Lobo, MD;  Location: [facility];  Service: Endoscopy;  Laterality: N/A;   MITRAL VALVE REPAIR N/A 02/26/2021   Procedure: MITRAL VALVE  REPAIR;  Surgeon: [name], MD;  Location: [place] [facility];  Service: Cardiovascular;  Laterality: N/A;   NEPHRECTOMY Left 2002   RIGHT/LEFT HEART CATH AND CORONARY ANGIOGRAPHY N/A 01/27/2021   Procedure: RIGHT/LEFT HEART CATH AND CORONARY ANGIOGRAPHY;  Surgeon: Larey Dresser, MD;  Location: [place] [facility];  Service: Cardiovascular;  Laterality: N/A;   TEE WITHOUT CARDIOVERSION N/A 01/27/2021   Procedure: TRANSESOPHAGEAL ECHOCARDIOGRAM (TEE);  Surgeon: Larey Dresser, MD;  Location: [facility];  Service: Cardiovascular;  Laterality: N/A;   TEE WITHOUT CARDIOVERSION N/A 02/26/2021   Procedure: TRANSESOPHAGEAL ECHOCARDIOGRAM (TEE);  Surgeon: [name], MD;  Location: [place] [facility];  Service: Cardiovascular;  Laterality: N/A;   TEE WITHOUT CARDIOVERSION N/A 02/27/2021   Procedure: TRANSESOPHAGEAL ECHOCARDIOGRAM (TEE);  Surgeon: Larey Dresser, MD;  Location: [facility] ENDOSCOPY;  Service: Cardiovascular;  Laterality: N/A;   TONSILLECTOMY      Current Medications: Current Meds  Medication Sig   ALPRAZolam (XANAX) 0.25 MG tablet Take 1 tablet (0.25 mg total) by mouth every 6 (six) hours as needed for anxiety   apixaban (ELIQUIS) 5 MG TABS tablet TAKE 1 TABLET BY MOUTH TWICE DAILY   Apoaequorin (PREVAGEN EXTRA STRENGTH PO) Take 1 tablet by mouth daily.   Barberry-Oreg Grape-Goldenseal (BERBERINE COMPLEX PO) Take 2 capsules by mouth daily.   Chromium 400 MCG TABS Take 400 mcg by mouth daily.   ciclopirox (PENLAC) 8 % solution Apply 1 application topically at bedtime as needed (toe nail fungus).   Coenzyme Q10 (CO Q10) 200 MG CAPS Take 200 mg by mouth daily.   empagliflozin (JARDIANCE) 10 MG TABS tablet Take 1 tablet (10 mg total) by mouth daily before breakfast.   ferrous gluconate (FERGON) 324 MG tablet Take 324 mg by mouth daily with breakfast.   furosemide (LASIX) 40 MG tablet Take 1 tablet (40 mg total) by mouth daily.   glucosamine-chondroitin 500-400 MG tablet Take  1 tablet by mouth 2 (two) times daily.    KRILL OIL PO Take 1 capsule by mouth daily at 12 noon.   Linoleic Acid Conjugated 1000 MG CAPS Take 1,000 mg by mouth in the morning and at bedtime.   losartan (COZAAR) 50 MG tablet TAKE 1 TABLET BY MOUTH EVERY DAY (Patient taking differently: Take 50 mg by mouth daily.)   Magnesium Glycinate 665 MG CAPS Take 665 mg by mouth in the morning and at bedtime.   Melatonin 5 MG TABS Take 5 mg by mouth at bedtime.   metoprolol succinate (TOPROL-XL) 50 MG 24 hr tablet TAKE 1 TABLET (50 MG TOTAL) BY MOUTH 2 (TWO) TIMES DAILY. TAKE WITH OR IMMEDIATELY FOLLOWING A MEAL. (Patient taking differently: Take 50 mg by mouth in the morning and at bedtime.)   Multiple Vitamin (MULTIVITAMIN) capsule Take 2 capsules by mouth daily.   naproxen sodium (ALEVE) 220 MG tablet Take 220 mg by mouth 2 (two) times daily as needed (pain/headache).   NON FORMULARY Take 1 capsule by mouth 2 (two) times daily.  TRU vitamin   omeprazole (PRILOSEC) 20 MG capsule Take 1 capsule (20 mg total) by mouth daily.   OVER THE COUNTER MEDICATION Take 2 capsules by mouth daily. [facility]   OVER THE COUNTER MEDICATION Take 2 capsules by mouth daily. [facility]   potassium chloride SA (KLOR-CON) 20 MEQ tablet Take 2 tablets (40 mEq total) by mouth daily.   tadalafil (CIALIS) 20 MG tablet TAKE ONE TABLET BY MOUTH DAILY AS NEEDED (Patient taking differently: Take 20 mg by mouth daily as needed for erectile dysfunction.)     Allergies:   Patient has no known allergies.   Social History   Socioeconomic History   Marital status: Widowed    Spouse name: Not on file   Number of children: 3   Years of education: Not on file   Highest education level: Not on file  Occupational History   Occupation: judge, Chief Executive Officer, professor    Comment: retired from all 3  Tobacco Use   Smoking status: Never   Smokeless tobacco: Never  Vaping Use   Vaping Use: Never used  Substance and Sexual Activity   Alcohol use:  Yes    Alcohol/week: 0.0 standard drinks    Comment: occasional   Drug use: No   Sexual activity: Not on file  Other Topics Concern   Not on file  Social History Narrative   Not on file   Social Determinants of Health   Financial Resource Strain: Not on file  Food Insecurity: Not on file  Transportation Needs: Not on file  Physical Activity: Not on file  Stress: Not on file  Social Connections: Not on file     Family History: The patient's family history includes Breast cancer in his mother; Hypertension in his father. There is no history of Diabetes, Stroke, CAD, Colon cancer, Colon polyps, Esophageal cancer, Rectal cancer, or Stomach cancer.  ROS:   Please see the history of present illness.    All other systems reviewed and are negative.  EKGs/Labs/Other Studies Reviewed:    The following studies were reviewed today: TEE (02/27/2021):   Findings: Please see echo section for full report.  Normal left ventricular size with EF 55-60%.  Normal wall thickness and wall motion.  Mild D-shape to the septum suggesting a degree of RV pressure/volume overload.  The right ventricle was moderately dilated with normal systolic function.  Moderate-severe left atrial enlargement with no LA appendage thrombus.  Severely dilated right atrium.  There is a small ASD in the mid septum from recent septal puncture.  There was severe tricuspid regurgitation, suspect functional due to annular dilatation and poor leaflet coaptation.  Peak RV-RA gradient 44 mmHg.  Trileaflet aortic valve with no stenosis or regurgitation.  There was severe, highly eccentric, anteriorly-directed mitral regurgitation.  There was prolapse and partial flail of the P3 segment of the posterior mitral valve.  Vena contracta area 0.66 cm^2.  Unable to measure PISA due to highly eccentric jet.  The patient is status post Mitraclip placement.  The clip was in an A2/P2 orientation initially, now is only attached to the A2 segment.  It  appears firmly attached.  There does appear to be space lateral to the original clip to add a new Mitraclip.  Mean gradient 4 mmHg across the mitral valve.  The pulmonary veins showed systolic flattening but not definite flow reversal on the pulmonary vein doppler interrogation. Normal caliber aorta with grade 3 plaque descending thoracic aorta.    Impression: Severe, eccentric MR with prolapse  and partial P3 flail.  Failed Mitraclip, only attached to the A2 segment.  There appears to be space lateral to the original Mitraclip for redo procedure.   Recent Labs: 11/26/2020: NT-Pro BNP 1,404; TSH 2.950 02/18/2021: B Natriuretic Peptide 381.0 02/23/2021: ALT 23 02/27/2021: BUN 19; Creatinine, Ser 1.06; Hemoglobin 12.4; Platelets 121; Potassium 3.9; Sodium 135  Recent Lipid Panel    Component Value Date/Time   TRIG 31 09/08/2018 1556    Risk Assessment/Calculations:    CHA2DS2-VASc Score = 5   This indicates a 7.2% annual risk of stroke. The patient's score is based upon: CHF History: 1 HTN History: 1 Diabetes History: 0 Stroke History: 0 Vascular Disease History: 1 Age Score: 2 Gender Score: 0          Physical Exam:    VS:  BP 112/70   Pulse 80   Ht 5\' 11"  (1.803 m)   Wt 165 lb 3.2 oz (74.9 kg)   SpO2 100%   BMI 23.04 kg/m     Wt Readings from Last 3 Encounters:  03/05/21 165 lb 3.2 oz (74.9 kg)  02/26/21 155 lb (70.3 kg)  02/23/21 162 lb 3.2 oz (73.6 kg)     GEN:  Well nourished, well developed in no acute distress HEENT: Normal NECK: No JVD; No carotid bruits LYMPHATICS: No lymphadenopathy CARDIAC: RRR, 3/6 holosystolic murmur at the apex RESPIRATORY:  Clear to auscultation without rales, wheezing or rhonchi  ABDOMEN: Soft, non-tender, non-distended MUSCULOSKELETAL:  trace bilateral pretibial edema; No deformity  SKIN: Warm and dry NEUROLOGIC:  Alert and oriented x 3 PSYCHIATRIC:  Normal affect   ASSESSMENT:    1. Severe mitral insufficiency    PLAN:     In order of problems listed above:  The patient has severe nonrheumatic primary mitral regurgitation secondary to severe prolapse and flail of the P3 segment of the posterior leaflet.  He underwent transcatheter edge-to-edge repair of the mitral valve with single leaflet device dettachment (SLDA) the day following the procedure. The previously implanted MitraClip device is attached to the A2 scallop. He has NYHA class II symptoms. Treatment options include palliative medical therapy, repeat edge-to-edge repair of the mitral valve with positioning of the device lateral to the old clip on the A3/P3 segment, or high risk conventional heart surgery.  I reviewed the pros and cons of each of these approaches with the patient today.  I do not think he will do well with conservative management as he is already developed RV dilatation, pulmonary hypertension, and symptomatic mitral regurgitation.  At the time of his MitraClip procedure, his baseline left atrial V wave was about 50 mmHg.  We discussed technical considerations around repeat transcatheter edge-to-edge repair of the mitral valve.  He understands that we would try to place a clip medial to the current clip in order to both reduce his mitral regurgitation and stabilize the clip that is attached to the anterior leaflet.  I offered him treatment again at our center versus referral to a tertiary center such as [facility] in [place] where they have a very experienced structural heart team.  In addition, we discussed the option of high risk surgery, possibly via a minithoracotomy approach.  He understands that I would likely refer him to Dr Cheree Ditto at [facility] if he decides to consider this. We ultimately agreed that he would like to think further about his options. I advised him that I will send images of his baseline TEE, intraop TEE, and POD #1  TEE to Dr Bridgette Habermann at [facility] [place] for further review. I will touch base with him in the near future after  I hear back from Dr Bridgette Habermann.         Medication Adjustments/Labs and Tests Ordered: Current medicines are reviewed at length with the patient today.  Concerns regarding medicines are outlined above.  No orders of the defined types were placed in this encounter.  No orders of the defined types were placed in this encounter.   Patient Instructions  Medication Instructions:  Your physician recommends that you continue on your current medications as directed. Please refer to the Current Medication list given to you today.  *If you need a refill on your cardiac medications before your next appointment, please call your pharmacy*   Lab Work: None ordered   If you have labs (blood work) drawn today and your tests are completely normal, you will receive your results only by: [place] (if you have MyChart) OR A paper copy in the mail If you have any lab test that is abnormal or we need to change your treatment, we will call you to review the results.   Testing/Procedures: None ordered    Follow-Up: Call our office once you make your decision [phone]  Here are are some future dates 03/19/21 and 04/23/21    Other Instructions None    Signed, [name], MD  03/05/2021 3:13 PM    [facility]

## 2021-03-13 ENCOUNTER — Other Ambulatory Visit: Payer: Self-pay

## 2021-03-13 ENCOUNTER — Telehealth: Payer: Self-pay | Admitting: Cardiovascular Disease

## 2021-03-13 DIAGNOSIS — I340 Nonrheumatic mitral (valve) insufficiency: Secondary | ICD-10-CM

## 2021-03-13 NOTE — Telephone Encounter (Signed)
Their office received a referral for patient but without the demographic of the patient. Office needs that info fax to their off [phone]. Please advise

## 2021-03-13 NOTE — Telephone Encounter (Signed)
I have contacted [facility] in regards to this request and faxed.

## 2021-03-16 NOTE — Pre-Procedure Instructions (Signed)
Surgical Instructions    Your procedure is scheduled on Thursday, November 3rd.  Report to [facility] Main Entrance "A" at 11:45 A.M., then check in with the Admitting office.  Call this number if you have problems the morning of surgery:  [phone]   If you have any questions prior to your surgery date call [phone]: Open Monday-Friday 8am-4pm    Remember:  Do not eat or drink after midnight the night before your surgery   Take these medicines the morning of surgery with A SIP OF WATER metoprolol succinate (TOPROL-XL)  omeprazole (PRILOSEC) ALPRAZolam (XANAX)-as needed  Per your surgeon, STOP apixaban (ELIQUIS) after your evening dose on 03/16/21.    As of today, STOP taking any Aspirin (unless otherwise instructed by your surgeon) Aleve, Naproxen, Ibuprofen, Motrin, Advil, Goody's, BC's, all herbal medications, fish oil, and all vitamins.  WHAT DO I DO ABOUT MY DIABETES MEDICATION?   Do not take oral empagliflozin (JARDIANCE) on Wednesday (11/2) or the morning of surgery (11/3).   HOW TO MANAGE YOUR DIABETES BEFORE AND AFTER SURGERY  Why is it important to control my blood sugar before and after surgery? Improving blood sugar levels before and after surgery helps healing and can limit problems. A way of improving blood sugar control is eating a healthy diet by:  Eating less sugar and carbohydrates  Increasing activity/exercise  Talking with your doctor about reaching your blood sugar goals High blood sugars (greater than 180 mg/dL) can raise your risk of infections and slow your recovery, so you will need to focus on controlling your diabetes during the weeks before surgery. Make sure that the doctor who takes care of your diabetes knows about your planned surgery including the date and location.  How do I manage my blood sugar before surgery? Check your blood sugar at least 4 times a day, starting 2 days before surgery, to make sure that the level is not too high  or low.  Check your blood sugar the morning of your surgery when you wake up and every 2 hours until you get to the Short Stay unit.  If your blood sugar is less than 70 mg/dL, you will need to treat for low blood sugar: Do not take insulin. Treat a low blood sugar (less than 70 mg/dL) with  cup of clear juice (cranberry or apple), 4 glucose tablets, OR glucose gel. Recheck blood sugar in 15 minutes after treatment (to make sure it is greater than 70 mg/dL). If your blood sugar is not greater than 70 mg/dL on recheck, call [phone] for further instructions. Report your blood sugar to the short stay nurse when you get to Short Stay.  If you are admitted to the hospital after surgery: Your blood sugar will be checked by the staff and you will probably be given insulin after surgery (instead of oral diabetes medicines) to make sure you have good blood sugar levels. The goal for blood sugar control after surgery is 80-180 mg/dL.                      Do NOT Smoke (Tobacco/Vaping) or drink Alcohol 24 hours prior to your procedure.  If you use a CPAP at night, you may bring all equipment for your overnight stay.   Contacts, glasses, piercing's, hearing aid's, dentures or partials may not be worn into surgery, please bring cases for these belongings.    For patients admitted to the hospital, discharge time will be determined by your treatment  team.   Patients discharged the day of surgery will not be allowed to drive home, and someone needs to stay with them for 24 hours.  NO VISITORS WILL BE ALLOWED IN PRE-OP WHERE PATIENTS GET READY FOR SURGERY.  ONLY 1 SUPPORT PERSON MAY BE PRESENT IN THE WAITING ROOM WHILE YOU ARE IN SURGERY.  IF YOU ARE TO BE ADMITTED, ONCE YOU ARE IN YOUR ROOM YOU WILL BE ALLOWED TWO (2) VISITORS.  Minor children may have two parents present. Special consideration for safety and communication needs will be reviewed on a case by case basis.   Special instructions:    [place] [facility]- Preparing For Surgery  Before surgery, you can play an important role. Because skin is not sterile, your skin needs to be as free of germs as possible. You can reduce the number of germs on your skin by washing with CHG (chlorahexidine gluconate) Soap before surgery.  CHG is an antiseptic cleaner which kills germs and bonds with the skin to continue killing germs even after washing.    Oral Hygiene is also important to reduce your risk of infection.  Remember - BRUSH YOUR TEETH THE MORNING OF SURGERY WITH YOUR REGULAR TOOTHPASTE  Please do not use if you have an allergy to CHG or antibacterial soaps. If your skin becomes reddened/irritated stop using the CHG.  Do not shave (including legs and underarms) for at least 48 hours prior to first CHG shower. It is OK to shave your face.  Please follow these instructions carefully.   Shower the NIGHT BEFORE SURGERY and the MORNING OF SURGERY  If you chose to wash your hair, wash your hair first as usual with your normal shampoo.  After you shampoo, rinse your hair and body thoroughly to remove the shampoo.  Use CHG Soap as you would any other liquid soap. You can apply CHG directly to the skin and wash gently with a scrungie or a clean washcloth.   Apply the CHG Soap to your body ONLY FROM THE NECK DOWN.  Do not use on open wounds or open sores. Avoid contact with your eyes, ears, mouth and genitals (private parts). Wash Face and genitals (private parts)  with your normal soap.   Wash thoroughly, paying special attention to the area where your surgery will be performed.  Thoroughly rinse your body with warm water from the neck down.  DO NOT shower/wash with your normal soap after using and rinsing off the CHG Soap.  Pat yourself dry with a CLEAN TOWEL.  Wear CLEAN PAJAMAS to bed the night before surgery  Place CLEAN SHEETS on your bed the night before your surgery  DO NOT SLEEP WITH PETS.   Day of Surgery: Shower with CHG  soap. Do not wear jewelry. Do not wear lotions, powders, colognes, or deodorant. Do not shave 48 hours prior to surgery.  Men may shave face and neck. Do not bring valuables to the hospital. [facility] is not responsible for any belongings or valuables. Wear Clean/Comfortable clothing the morning of surgery Remember to brush your teeth WITH YOUR REGULAR TOOTHPASTE.   Please read over the following fact sheets that you were given.   3 days prior to your procedure or After your COVID test   You are not required to quarantine however you are required to wear a well-fitting mask when you are out and around people not in your household. If your mask becomes wet or soiled, replace with a new one.   Wash your  hands often with soap and water for 20 seconds or clean your hands with an alcohol-based hand sanitizer that contains at least 60% alcohol.   Do not share personal items.   Notify your provider:  o if you are in close contact with someone who has COVID  o or if you develop a fever of 100.4 or greater, sneezing, cough, sore throat, shortness of breath or body aches.

## 2021-03-17 ENCOUNTER — Other Ambulatory Visit (HOSPITAL_COMMUNITY): Payer: Self-pay

## 2021-03-17 ENCOUNTER — Other Ambulatory Visit: Payer: Self-pay

## 2021-03-17 ENCOUNTER — Ambulatory Visit (HOSPITAL_COMMUNITY)
Admission: RE | Admit: 2021-03-17 | Discharge: 2021-03-17 | Disposition: A | Discharge status: Home / Self Care | Payer: Medicare PPO | Source: Ambulatory Visit | Attending: Cardiovascular Disease | Admitting: Cardiovascular Disease

## 2021-03-17 ENCOUNTER — Encounter (HOSPITAL_COMMUNITY): Payer: Self-pay

## 2021-03-17 ENCOUNTER — Encounter (HOSPITAL_COMMUNITY)
Admission: RE | Admit: 2021-03-17 | Discharge: 2021-03-17 | Disposition: A | Discharge status: Home / Self Care | Payer: Medicare PPO | Source: Ambulatory Visit | Attending: Cardiovascular Disease | Admitting: Cardiovascular Disease

## 2021-03-17 DIAGNOSIS — Z79899 Other long term (current) drug therapy: Secondary | ICD-10-CM | POA: Diagnosis not present

## 2021-03-17 DIAGNOSIS — Z20822 Contact with and (suspected) exposure to covid-19: Secondary | ICD-10-CM | POA: Insufficient documentation

## 2021-03-17 DIAGNOSIS — Z006 Encounter for examination for normal comparison and control in clinical research program: Secondary | ICD-10-CM | POA: Diagnosis not present

## 2021-03-17 DIAGNOSIS — I272 Pulmonary hypertension, unspecified: Secondary | ICD-10-CM | POA: Diagnosis present

## 2021-03-17 DIAGNOSIS — Z8249 Family history of ischemic heart disease and other diseases of the circulatory system: Secondary | ICD-10-CM | POA: Diagnosis not present

## 2021-03-17 DIAGNOSIS — J069 Acute upper respiratory infection, unspecified: Secondary | ICD-10-CM | POA: Diagnosis not present

## 2021-03-17 DIAGNOSIS — Z23 Encounter for immunization: Secondary | ICD-10-CM | POA: Diagnosis present

## 2021-03-17 DIAGNOSIS — I509 Heart failure, unspecified: Secondary | ICD-10-CM | POA: Diagnosis not present

## 2021-03-17 DIAGNOSIS — I340 Nonrheumatic mitral (valve) insufficiency: Secondary | ICD-10-CM

## 2021-03-17 DIAGNOSIS — K219 Gastro-esophageal reflux disease without esophagitis: Secondary | ICD-10-CM | POA: Diagnosis present

## 2021-03-17 DIAGNOSIS — I4819 Other persistent atrial fibrillation: Secondary | ICD-10-CM | POA: Diagnosis present

## 2021-03-17 DIAGNOSIS — Z905 Acquired absence of kidney: Secondary | ICD-10-CM | POA: Diagnosis not present

## 2021-03-17 DIAGNOSIS — I5032 Chronic diastolic (congestive) heart failure: Secondary | ICD-10-CM | POA: Diagnosis present

## 2021-03-17 DIAGNOSIS — I081 Rheumatic disorders of both mitral and tricuspid valves: Secondary | ICD-10-CM | POA: Diagnosis not present

## 2021-03-17 DIAGNOSIS — Z01818 Encounter for other preprocedural examination: Secondary | ICD-10-CM

## 2021-03-17 DIAGNOSIS — I110 Hypertensive heart disease with heart failure: Secondary | ICD-10-CM | POA: Diagnosis present

## 2021-03-17 DIAGNOSIS — I517 Cardiomegaly: Secondary | ICD-10-CM | POA: Diagnosis not present

## 2021-03-17 DIAGNOSIS — I341 Nonrheumatic mitral (valve) prolapse: Secondary | ICD-10-CM | POA: Diagnosis present

## 2021-03-17 DIAGNOSIS — Z954 Presence of other heart-valve replacement: Secondary | ICD-10-CM | POA: Diagnosis not present

## 2021-03-17 LAB — PROTIME-INR
INR: 1.3 — ABNORMAL HIGH (ref 0.8–1.2)
Prothrombin Time: 16.3 seconds — ABNORMAL HIGH (ref 11.4–15.2)

## 2021-03-17 LAB — COMPREHENSIVE METABOLIC PANEL
ALT: 21 U/L (ref 0–44)
AST: 38 U/L (ref 15–41)
Albumin: 3.7 g/dL (ref 3.5–5.0)
Alkaline Phosphatase: 74 U/L (ref 38–126)
Anion gap: 8 (ref 5–15)
BUN: 17 mg/dL (ref 8–23)
CO2: 21 mmol/L — ABNORMAL LOW (ref 22–32)
Calcium: 8.8 mg/dL — ABNORMAL LOW (ref 8.9–10.3)
Chloride: 103 mmol/L (ref 98–111)
Creatinine, Ser: 1 mg/dL (ref 0.61–1.24)
GFR, Estimated: >60 mL/min (ref >60)
Glucose, Bld: 118 mg/dL — ABNORMAL HIGH (ref 70–99)
Potassium: 4.6 mmol/L (ref 3.5–5.1)
Sodium: 132 mmol/L — ABNORMAL LOW (ref 135–145)
Total Bilirubin: 1.7 mg/dL — ABNORMAL HIGH (ref 0.3–1.2)
Total Protein: 6.4 g/dL — ABNORMAL LOW (ref 6.5–8.1)

## 2021-03-17 LAB — URINALYSIS, ROUTINE W REFLEX MICROSCOPIC
Bacteria, UA: NONE SEEN
Bilirubin Urine: NEGATIVE
Glucose, UA: >=500 mg/dL — AB
Ketones, ur: NEGATIVE mg/dL
Leukocytes,Ua: NEGATIVE
Nitrite: NEGATIVE
Protein, ur: NEGATIVE mg/dL
Specific Gravity, Urine: 1.006 (ref 1.005–1.030)
pH: 6 (ref 5.0–8.0)

## 2021-03-17 LAB — CBC
HCT: 41.9 % (ref 39.0–52.0)
Hemoglobin: 13.7 g/dL (ref 13.0–17.0)
MCH: 30.9 pg (ref 26.0–34.0)
MCHC: 32.7 g/dL (ref 30.0–36.0)
MCV: 94.4 fL (ref 80.0–100.0)
Platelets: 135 10*3/uL — ABNORMAL LOW (ref 150–400)
RBC: 4.44 MIL/uL (ref 4.22–5.81)
RDW: 14.6 % (ref 11.5–15.5)
WBC: 6.1 10*3/uL (ref 4.0–10.5)
nRBC: 0 % (ref 0.0–0.2)

## 2021-03-17 LAB — SARS CORONAVIRUS 2 BY RT PCR (HOSPITAL ORDER, PERFORMED IN ~~LOC~~ HOSPITAL LAB): SARS Coronavirus 2: NEGATIVE

## 2021-03-17 LAB — BLOOD GAS, ARTERIAL
Acid-base deficit: 0.3 mmol/L (ref 0.0–2.0)
Bicarbonate: 23.6 mmol/L (ref 20.0–28.0)
FIO2: 21
O2 Saturation: 97.9 %
Patient temperature: 37
pCO2 arterial: 37.1 mmHg (ref 32.0–48.0)
pH, Arterial: 7.421 (ref 7.350–7.450)
pO2, Arterial: 103 mmHg (ref 83.0–108.0)

## 2021-03-17 LAB — TYPE AND SCREEN
ABO/RH(D): O POS
Antibody Screen: NEGATIVE

## 2021-03-17 LAB — HEMOGLOBIN A1C
Hgb A1c MFr Bld: 5.4 % (ref 4.8–5.6)
Mean Plasma Glucose: 108.28 mg/dL

## 2021-03-17 NOTE — Progress Notes (Signed)
PCP - Dr. Burnard Bunting Cardiologist - Dr. [name]  PPM/ICD - n/a  Chest x-ray - 03/17/21 EKG - 02/27/21 Stress Test - denies ECHO - 02/27/21 Cardiac Cath - 02/26/21  Sleep Study - denies CPAP - denies Removed sleep apnea from pt's history. Pt denies this and denies ever having a sleep study. May have been charted in error.  Pt is not diabetic. Takes Jardiance for heart, prescribed by Dr. Burt Knack.   Blood Thinner Instructions:Eliquis. LD 03/16/21 as ordered by Cardiology. Aspirin Instructions: n/a  NPO at MD.  COVID TEST- 03/17/21, done as a rapid. Pt presented with symptoms of running nose and cough with dark sputum. VSS and no temp noted. Pt reports symptoms for a little more than a week. Pt reports recent flu vaccination. Structural heart team made aware. Karoline Caldwell, PA-C evaluated pt during appt. Proceeded with collected labs and CXR with Structural heart following up on results. Pt advised to f/u with PCP regarding symptoms.   Anesthesia review: Yes, Karoline Caldwell, PA-C following up on PAT evaluation.   Patient denies shortness of breath, fever, cough and chest pain at PAT appointment   All instructions explained to the patient, with a verbal understanding of the material. Patient agrees to go over the instructions while at home for a better understanding. Patient also instructed to self quarantine after being tested for COVID-19. The opportunity to ask questions was provided.

## 2021-03-18 NOTE — Progress Notes (Signed)
Anesthesia Chart Review:  Evaluated patient at preop appointment for report of productive cough.  He reports productive cough with dark sputum for about 10 days or so; he was on vacation so hasnt been seen by PCP. Productive cough worse in the morning, improves during the day, only other associated symptom is some fatigue. No fevers. Denies increased SOB.  No leg swelling, actually says that's been better as of late.  Started mucinex with maybe mild improvement, overall slightly better but not improving rapidly.  He reports he did receive his flu shot shortly before the symptoms started.  He appears well on exam, no respiratory distress, faint crackle in right lower lung base, otherwise lungs are clear, A. fib with slow ventricular response which is his baseline, no lower extremity edema.  Discussed with patient that although his exam is largely benign, it would be up to Dr Burt Knack to decide on whether or not to proceed once he has reviewed all labs and imaging.  I reached out to the structural heart team to make them aware.  Plan to complete work-up today with usual labs and CXR as ordered.    ABG, CMP, CBC with only minor abnormalities, overall unremarkable.  CXR showed no acute process.  Dr. Burt Knack to review and make final decisions regarding proceeding.   John Holder, John Holder [facility]/Anesthesiology Phone [phone] 03/18/2021 10:21 AM

## 2021-03-19 ENCOUNTER — Encounter (HOSPITAL_COMMUNITY): Payer: Self-pay | Admitting: Cardiovascular Disease

## 2021-03-19 ENCOUNTER — Other Ambulatory Visit: Payer: Self-pay

## 2021-03-19 ENCOUNTER — Inpatient Hospital Stay (HOSPITAL_COMMUNITY): Payer: Medicare PPO | Admitting: Physician Assistant

## 2021-03-19 ENCOUNTER — Encounter (HOSPITAL_COMMUNITY)
Admission: RE | Disposition: A | Discharge status: Home / Self Care | Payer: Self-pay | Source: Ambulatory Visit | Attending: Cardiovascular Disease

## 2021-03-19 ENCOUNTER — Inpatient Hospital Stay (HOSPITAL_COMMUNITY): Payer: Medicare PPO

## 2021-03-19 ENCOUNTER — Inpatient Hospital Stay (HOSPITAL_COMMUNITY)
Admission: RE | Admit: 2021-03-19 | Discharge: 2021-03-20 | DRG: 267 | Disposition: A | Discharge status: Home / Self Care | Payer: Medicare PPO | Source: Ambulatory Visit | Attending: Cardiovascular Disease | Admitting: Cardiovascular Disease

## 2021-03-19 DIAGNOSIS — I341 Nonrheumatic mitral (valve) prolapse: Secondary | ICD-10-CM | POA: Diagnosis present

## 2021-03-19 DIAGNOSIS — K219 Gastro-esophageal reflux disease without esophagitis: Secondary | ICD-10-CM | POA: Diagnosis present

## 2021-03-19 DIAGNOSIS — I10 Essential (primary) hypertension: Secondary | ICD-10-CM | POA: Diagnosis present

## 2021-03-19 DIAGNOSIS — Z905 Acquired absence of kidney: Secondary | ICD-10-CM

## 2021-03-19 DIAGNOSIS — I5032 Chronic diastolic (congestive) heart failure: Secondary | ICD-10-CM | POA: Diagnosis present

## 2021-03-19 DIAGNOSIS — Z8249 Family history of ischemic heart disease and other diseases of the circulatory system: Secondary | ICD-10-CM | POA: Diagnosis not present

## 2021-03-19 DIAGNOSIS — I110 Hypertensive heart disease with heart failure: Secondary | ICD-10-CM | POA: Diagnosis present

## 2021-03-19 DIAGNOSIS — Z23 Encounter for immunization: Secondary | ICD-10-CM

## 2021-03-19 DIAGNOSIS — I340 Nonrheumatic mitral (valve) insufficiency: Secondary | ICD-10-CM | POA: Diagnosis not present

## 2021-03-19 DIAGNOSIS — I4819 Other persistent atrial fibrillation: Secondary | ICD-10-CM | POA: Diagnosis not present

## 2021-03-19 DIAGNOSIS — Z95818 Presence of other cardiac implants and grafts: Secondary | ICD-10-CM

## 2021-03-19 DIAGNOSIS — Z006 Encounter for examination for normal comparison and control in clinical research program: Secondary | ICD-10-CM

## 2021-03-19 DIAGNOSIS — I272 Pulmonary hypertension, unspecified: Secondary | ICD-10-CM | POA: Diagnosis present

## 2021-03-19 DIAGNOSIS — I482 Chronic atrial fibrillation, unspecified: Secondary | ICD-10-CM | POA: Diagnosis present

## 2021-03-19 DIAGNOSIS — Z79899 Other long term (current) drug therapy: Secondary | ICD-10-CM

## 2021-03-19 DIAGNOSIS — Z20822 Contact with and (suspected) exposure to covid-19: Secondary | ICD-10-CM | POA: Diagnosis present

## 2021-03-19 DIAGNOSIS — Z954 Presence of other heart-valve replacement: Secondary | ICD-10-CM | POA: Diagnosis not present

## 2021-03-19 HISTORY — PX: TEE WITHOUT CARDIOVERSION: SHX5443

## 2021-03-19 HISTORY — PX: MITRAL VALVE REPAIR: CATH118311

## 2021-03-19 LAB — POCT ACTIVATED CLOTTING TIME: Activated Clotting Time: 341 seconds

## 2021-03-19 SURGERY — MITRAL VALVE REPAIR
Anesthesia: General

## 2021-03-19 MED ORDER — ACETAMINOPHEN 325 MG PO TABS: 650.0000 mg | ORAL_TABLET | ORAL | Status: DC | PRN

## 2021-03-19 MED ORDER — CHLORHEXIDINE GLUCONATE 0.12 % MT SOLN: 15.0000 mL | Freq: Once | OROMUCOSAL | Status: DC

## 2021-03-19 MED ORDER — LACTATED RINGERS IV SOLN: INTRAVENOUS | Status: DC | PRN

## 2021-03-19 MED ORDER — FUROSEMIDE 40 MG PO TABS
40.0000 mg | ORAL_TABLET | Freq: Every day | ORAL | Status: DC
  Administered 2021-03-20: 40 mg via ORAL
  Filled 2021-03-19 (×2): qty 1

## 2021-03-19 MED ORDER — SUGAMMADEX SODIUM 200 MG/2ML IV SOLN
INTRAVENOUS | Status: DC | PRN
  Administered 2021-03-19: 200 mg via INTRAVENOUS

## 2021-03-19 MED ORDER — PANTOPRAZOLE SODIUM 40 MG PO TBEC
40.0000 mg | DELAYED_RELEASE_TABLET | Freq: Every day | ORAL | Status: DC
  Administered 2021-03-20: 40 mg via ORAL
  Filled 2021-03-19: qty 1

## 2021-03-19 MED ORDER — HEPARIN (PORCINE) IN NACL 1000-0.9 UT/500ML-% IV SOLN
INTRAVENOUS | Status: DC | PRN
  Administered 2021-03-19: 500 mL

## 2021-03-19 MED ORDER — CHLORHEXIDINE GLUCONATE 4 % EX LIQD: 30.0000 mL | CUTANEOUS | Status: DC

## 2021-03-19 MED ORDER — ROCURONIUM BROMIDE 10 MG/ML (PF) SYRINGE
PREFILLED_SYRINGE | INTRAVENOUS | Status: DC | PRN
  Administered 2021-03-19: 10 mg via INTRAVENOUS
  Administered 2021-03-19: 50 mg via INTRAVENOUS

## 2021-03-19 MED ORDER — CICLOPIROX 8 % EX SOLN: 1.0000 "application " | Freq: Every evening | CUTANEOUS | Status: DC | PRN

## 2021-03-19 MED ORDER — FERROUS GLUCONATE 324 (38 FE) MG PO TABS
324.0000 mg | ORAL_TABLET | Freq: Every day | ORAL | Status: DC
  Administered 2021-03-20: 324 mg via ORAL
  Filled 2021-03-19: qty 1

## 2021-03-19 MED ORDER — ALPRAZOLAM 0.5 MG PO TABS: 0.2500 mg | ORAL_TABLET | Freq: Four times a day (QID) | ORAL | Status: DC | PRN

## 2021-03-19 MED ORDER — CHLORHEXIDINE GLUCONATE 4 % EX LIQD: 60.0000 mL | Freq: Once | CUTANEOUS | Status: DC

## 2021-03-19 MED ORDER — SODIUM CHLORIDE 0.9 % IV SOLN: INTRAVENOUS | Status: DC

## 2021-03-19 MED ORDER — MELATONIN 5 MG PO TABS
5.0000 mg | ORAL_TABLET | Freq: Every day | ORAL | Status: DC
  Administered 2021-03-19: 5 mg via ORAL
  Filled 2021-03-19: qty 1

## 2021-03-19 MED ORDER — LACTATED RINGERS IV SOLN: INTRAVENOUS | Status: DC

## 2021-03-19 MED ORDER — CEFAZOLIN SODIUM-DEXTROSE 2-4 GM/100ML-% IV SOLN
2.0000 g | INTRAVENOUS | Status: AC
  Administered 2021-03-19: 2 g via INTRAVENOUS

## 2021-03-19 MED ORDER — FENTANYL CITRATE (PF) 100 MCG/2ML IJ SOLN
INTRAMUSCULAR | Status: DC | PRN
  Administered 2021-03-19: 100 ug via INTRAVENOUS

## 2021-03-19 MED ORDER — SODIUM CHLORIDE 0.9% FLUSH
3.0000 mL | Freq: Two times a day (BID) | INTRAVENOUS | Status: DC
  Administered 2021-03-19: 3 mL via INTRAVENOUS

## 2021-03-19 MED ORDER — DEXAMETHASONE SODIUM PHOSPHATE 10 MG/ML IJ SOLN
INTRAMUSCULAR | Status: DC | PRN
  Administered 2021-03-19: 10 mg via INTRAVENOUS

## 2021-03-19 MED ORDER — ONDANSETRON HCL 4 MG/2ML IJ SOLN
INTRAMUSCULAR | Status: DC | PRN
  Administered 2021-03-19: 4 mg via INTRAVENOUS

## 2021-03-19 MED ORDER — LOSARTAN POTASSIUM 50 MG PO TABS
50.0000 mg | ORAL_TABLET | Freq: Every day | ORAL | Status: DC
  Administered 2021-03-19: 50 mg via ORAL
  Filled 2021-03-19 (×2): qty 1

## 2021-03-19 MED ORDER — SODIUM CHLORIDE 0.9 % IV SOLN: 250.0000 mL | INTRAVENOUS | Status: DC | PRN

## 2021-03-19 MED ORDER — CHLORHEXIDINE GLUCONATE 0.12 % MT SOLN
OROMUCOSAL | Status: AC
  Administered 2021-03-19: 15 mL
  Filled 2021-03-19: qty 15

## 2021-03-19 MED ORDER — EMPAGLIFLOZIN 10 MG PO TABS
10.0000 mg | ORAL_TABLET | Freq: Every day | ORAL | Status: DC
  Administered 2021-03-20: 10 mg via ORAL
  Filled 2021-03-19: qty 1

## 2021-03-19 MED ORDER — SODIUM CHLORIDE 0.9 % IV SOLN
INTRAVENOUS | Status: AC | PRN
  Administered 2021-03-19 (×3): 1000 mL via INTRAVENOUS

## 2021-03-19 MED ORDER — ONDANSETRON HCL 4 MG/2ML IJ SOLN: 4.0000 mg | Freq: Four times a day (QID) | INTRAMUSCULAR | Status: DC | PRN

## 2021-03-19 MED ORDER — LIDOCAINE 2% (20 MG/ML) 5 ML SYRINGE
INTRAMUSCULAR | Status: DC | PRN
  Administered 2021-03-19: 60 mg via INTRAVENOUS

## 2021-03-19 MED ORDER — CEFAZOLIN SODIUM-DEXTROSE 2-4 GM/100ML-% IV SOLN
INTRAVENOUS | Status: AC
  Filled 2021-03-19: qty 100

## 2021-03-19 MED ORDER — SODIUM CHLORIDE 0.9% FLUSH: 3.0000 mL | INTRAVENOUS | Status: DC | PRN

## 2021-03-19 MED ORDER — HEPARIN SODIUM (PORCINE) 1000 UNIT/ML IJ SOLN
INTRAMUSCULAR | Status: DC | PRN
  Administered 2021-03-19: 11000 [IU] via INTRAVENOUS

## 2021-03-19 MED ORDER — POTASSIUM CHLORIDE CRYS ER 20 MEQ PO TBCR
40.0000 meq | EXTENDED_RELEASE_TABLET | Freq: Every day | ORAL | Status: DC
  Administered 2021-03-19 – 2021-03-20 (×2): 40 meq via ORAL
  Filled 2021-03-19 (×2): qty 2

## 2021-03-19 MED ORDER — FENTANYL CITRATE (PF) 100 MCG/2ML IJ SOLN
INTRAMUSCULAR | Status: AC
  Filled 2021-03-19: qty 2

## 2021-03-19 MED ORDER — PROTAMINE SULFATE 10 MG/ML IV SOLN
INTRAVENOUS | Status: DC | PRN
  Administered 2021-03-19: 30 mg via INTRAVENOUS

## 2021-03-19 MED ORDER — HEPARIN (PORCINE) IN NACL 1000-0.9 UT/500ML-% IV SOLN
INTRAVENOUS | Status: AC
  Filled 2021-03-19: qty 500

## 2021-03-19 MED ORDER — PROPOFOL 10 MG/ML IV BOLUS
INTRAVENOUS | Status: DC | PRN
  Administered 2021-03-19: 100 mg via INTRAVENOUS

## 2021-03-19 MED ORDER — APIXABAN 5 MG PO TABS
5.0000 mg | ORAL_TABLET | Freq: Two times a day (BID) | ORAL | Status: DC
  Administered 2021-03-20: 5 mg via ORAL
  Filled 2021-03-19: qty 1

## 2021-03-19 MED ORDER — HEPARIN (PORCINE) IN NACL 2000-0.9 UNIT/L-% IV SOLN
INTRAVENOUS | Status: AC
  Filled 2021-03-19: qty 3000

## 2021-03-19 MED ORDER — PHENYLEPHRINE HCL-NACL 20-0.9 MG/250ML-% IV SOLN
INTRAVENOUS | Status: DC | PRN
  Administered 2021-03-19: 20 ug/min via INTRAVENOUS

## 2021-03-19 MED ORDER — EPHEDRINE SULFATE-NACL 50-0.9 MG/10ML-% IV SOSY
PREFILLED_SYRINGE | INTRAVENOUS | Status: DC | PRN
  Administered 2021-03-19 (×2): 10 mg via INTRAVENOUS

## 2021-03-19 MED ORDER — METOPROLOL SUCCINATE ER 50 MG PO TB24
50.0000 mg | ORAL_TABLET | Freq: Every day | ORAL | Status: DC
  Administered 2021-03-20: 50 mg via ORAL
  Filled 2021-03-19: qty 1

## 2021-03-19 SURGICAL SUPPLY — 17 items
CATH MITRA STEERABLE GUIDE (CATHETERS) ×2 IMPLANT
CLIP MITRA G4 DELIVERY SYS NTW (Clip) ×2 IMPLANT
CLOSURE PERCLOSE PROSTYLE (VASCULAR PRODUCTS) ×8 IMPLANT
ELECT DEFIB PAD ADLT CADENCE (PAD) ×2 IMPLANT
KIT DILATOR VASC 18G NDL (KITS) ×2 IMPLANT
KIT HEART LEFT (KITS) ×4 IMPLANT
KIT SHEA VERSACROSS D1 WIRE (KITS) IMPLANT
KIT SHEA VERSACROSS LAAC CONNE (KITS) ×2 IMPLANT
PACK CARDIAC CATHETERIZATION (CUSTOM PROCEDURE TRAY) ×2 IMPLANT
SHEATH PINNACLE 8F 10CM (SHEATH) ×2 IMPLANT
SHEATH PROBE COVER 6X72 (BAG) ×2 IMPLANT
STOPCOCK MORSE 400PSI 3WAY (MISCELLANEOUS) ×12 IMPLANT
SYSTEM MITRACLIP G4 (SYSTAGENIX WOUND MANAGEMENT) ×2 IMPLANT
TRANSDUCER W/STOPCOCK (MISCELLANEOUS) ×2 IMPLANT
TUBING ART PRESS 72  MALE/FEM (TUBING) ×2
TUBING ART PRESS 72 MALE/FEM (TUBING) ×1 IMPLANT
TUBING CIL FLEX 10 FLL-RA (TUBING) ×2 IMPLANT

## 2021-03-19 NOTE — Anesthesia Procedure Notes (Addendum)
Procedure Name: Intubation Date/Time: 03/19/2021 3:06 PM Performed by: Griffin Dakin, CRNA Pre-anesthesia Checklist: Patient identified, Emergency Drugs available, Suction available and Patient being monitored Patient Re-evaluated:Patient Re-evaluated prior to induction Oxygen Delivery Method: Circle system utilized Preoxygenation: Pre-oxygenation with 100% oxygen Induction Type: IV induction Ventilation: Mask ventilation without difficulty Laryngoscope Size: Mac and 4 Grade View: Grade I Tube type: Oral Tube size: 8.0 mm Number of attempts: 1 Airway Equipment and Method: Stylet and Oral airway Placement Confirmation: ETT inserted through vocal cords under direct vision, positive ETCO2 and breath sounds checked- equal and bilateral Secured at: 23 cm Tube secured with: Tape Dental Injury: Teeth and Oropharynx as per pre-operative assessment

## 2021-03-19 NOTE — Anesthesia Postprocedure Evaluation (Signed)
Anesthesia Post Note  Patient: John Holder  Procedure(s) Performed: MITRAL VALVE REPAIR TRANSESOPHAGEAL ECHOCARDIOGRAM (TEE)     Patient location during evaluation: Cath Lab Anesthesia Type: General Level of consciousness: awake Pain management: pain level controlled Vital Signs Assessment: post-procedure vital signs reviewed and stable Respiratory status: spontaneous breathing, nonlabored ventilation, respiratory function stable and patient connected to nasal cannula oxygen Cardiovascular status: blood pressure returned to baseline and stable Postop Assessment: no apparent nausea or vomiting Anesthetic complications: no   No notable events documented.  Last Vitals:  Vitals:   03/19/21 1848 03/19/21 1915  BP: 128/86 109/60  Pulse: 90 92  Resp:  15  Temp:  (!) 36.2 C  SpO2: 95% 94%    Last Pain:  Vitals:   03/19/21 1955  TempSrc:   PainSc: 0-No pain                 Delainey Winstanley P Cristan Hout

## 2021-03-19 NOTE — Progress Notes (Signed)
  Echocardiogram Echocardiogram Transesophageal has been performed.  John Holder M 03/19/2021, 4:52 PM

## 2021-03-19 NOTE — Anesthesia Procedure Notes (Addendum)
Arterial Line Insertion Start/End11/07/2020 1:10 AM, 03/19/2021 1:15 AM Performed by: Murvin Natal, MD, Valda Favia, CRNA, CRNA  Patient location: Pre-op. Preanesthetic checklist: patient identified, IV checked, site marked, risks and benefits discussed, surgical consent, monitors and equipment checked, pre-op evaluation, timeout performed and anesthesia consent Lidocaine 1% used for infiltration Right, radial was placed Catheter size: 20 G Hand hygiene performed  and maximum sterile barriers used   Attempts: 1 Procedure performed without using ultrasound guided technique. Following insertion, dressing applied and Biopatch. Post procedure assessment: normal and unchanged  Patient tolerated the procedure well with no immediate complications.

## 2021-03-19 NOTE — Transfer of Care (Signed)
Immediate Anesthesia Transfer of Care Note  Patient: John Holder  Procedure(s) Performed: MITRAL VALVE REPAIR TRANSESOPHAGEAL ECHOCARDIOGRAM (TEE)  Patient Location: PACU  Anesthesia Type:General  Level of Consciousness: awake and alert   Airway & Oxygen Therapy: Patient Spontanous Breathing and Patient connected to nasal cannula oxygen  Post-op Assessment: Report given to RN and Post -op Vital signs reviewed and stable  Post vital signs: Reviewed and stable  Last Vitals:  Vitals Value Taken Time  BP 128/63 03/19/21 1705  Temp 36.8 C 03/19/21 1705  Pulse 81 03/19/21 1705  Resp 18 03/19/21 1705  SpO2 98 03/19/21 1705    Last Pain:  Vitals:   03/19/21 1705  TempSrc: Temporal  PainSc: Asleep         Complications: No notable events documented.

## 2021-03-19 NOTE — Anesthesia Preprocedure Evaluation (Addendum)
Anesthesia Evaluation  Patient identified by MRN, date of birth, ID band Patient awake    Reviewed: Allergy & Precautions, NPO status , Patient's Chart, lab work & pertinent test results  Airway Mallampati: III  TM Distance: >3 FB Neck ROM: Full    Dental no notable dental hx.    Pulmonary neg pulmonary ROS,    Pulmonary exam normal breath sounds clear to auscultation       Cardiovascular hypertension, Pt. on medications and Pt. on home beta blockers pulmonary hypertension+CHF  + dysrhythmias Atrial Fibrillation + Valvular Problems/Murmurs MR  Rhythm:Irregular Rate:Normal + Systolic murmurs ECG: a-fib, rate 86  ECHO: The mitral valve has been repaired via TEER approach a NTW MitraClip placed at the A3P3 position. There is at least moderate eccentric mitral valve regurgitation. The is a return of the eccentric regurgitation seen prior to procedure. Pulmonary vein blunting. Saved images in [employer] report concerning for lack of posterior clip attachment. No evidence of mitral stenosis. The mean mitral valve gradient is 2.0 mmHg with average heart rate of 57 bpm. Left ventricular ejection fraction, by estimation, is 55 to 60%. The left ventricle has normal function. The left ventricle has no regional wall motion abnormalities. Right ventricular systolic function is moderately reduced. The right ventricular size is moderately enlarged. There is severely elevated pulmonary artery systolic pressure. Left atrial size was severely dilated. Right atrial size was severely dilated. A small pericardial effusion is present. Tricuspid valve regurgitation is severe. Birectional shunt present post transeptal puncture. The aortic valve is tricuspid. Aortic valve regurgitation is not visualized. No aortic stenosis is present.   Neuro/Psych negative neurological ROS  negative psych ROS   GI/Hepatic Neg liver ROS, GERD  Medicated and Controlled,   Endo/Other  negative endocrine ROS  Renal/GU negative Renal ROS     Musculoskeletal negative musculoskeletal ROS (+)   Abdominal   Peds  Hematology negative hematology ROS (+)   Anesthesia Other Findings Severe Mitral Insufficiency  Reproductive/Obstetrics                             Anesthesia Physical Anesthesia Plan  ASA: 4  Anesthesia Plan: General   Post-op Pain Management:    Induction: Intravenous  PONV Risk Score and Plan: 2 and Ondansetron, Dexamethasone and Treatment may vary due to age or medical condition  Airway Management Planned: Oral ETT  Additional Equipment: Arterial line and TEE  Intra-op Plan:   Post-operative Plan: Extubation in OR  Informed Consent: I have reviewed the patients History and Physical, chart, labs and discussed the procedure including the risks, benefits and alternatives for the proposed anesthesia with the patient or authorized representative who has indicated his/her understanding and acceptance.     Dental advisory given  Plan Discussed with: CRNA  Anesthesia Plan Comments: (TEE probe placement only)       Anesthesia Quick Evaluation

## 2021-03-19 NOTE — Interval H&P Note (Signed)
History and Physical Interval Note:  03/19/2021 2:24 PM  John Holder  has presented today for surgery, with the diagnosis of Severe Mitral Insufficiency.  The various methods of treatment have been discussed with the patient and family. After consideration of risks, benefits and other options for treatment, the patient has consented to  Procedure(s): MITRAL VALVE REPAIR (N/A) TRANSESOPHAGEAL ECHOCARDIOGRAM (TEE) (N/A) as a surgical intervention.  The patient's history has been reviewed, patient examined, no change in status, stable for surgery.  I have reviewed the patient's chart and labs.  Questions were answered to the patient's satisfaction.    Pt's case reviewed with colleagues. Pt has decided to seek further treatment at our center. We did not revisit cardiac surgery considering his very advanced age, heart failure, and patient's wishes after a shared decision-making conversation. He provides full informed consent to proceed with TEER (transcatheter edge-to-edge repair of the mitral valve).  [name]

## 2021-03-20 ENCOUNTER — Other Ambulatory Visit: Payer: Self-pay | Admitting: Physician Assistant

## 2021-03-20 ENCOUNTER — Inpatient Hospital Stay (HOSPITAL_COMMUNITY): Payer: Medicare PPO

## 2021-03-20 DIAGNOSIS — Z954 Presence of other heart-valve replacement: Secondary | ICD-10-CM

## 2021-03-20 DIAGNOSIS — Z9889 Other specified postprocedural states: Secondary | ICD-10-CM

## 2021-03-20 DIAGNOSIS — Z95818 Presence of other cardiac implants and grafts: Secondary | ICD-10-CM

## 2021-03-20 DIAGNOSIS — I340 Nonrheumatic mitral (valve) insufficiency: Secondary | ICD-10-CM

## 2021-03-20 LAB — CBC
HCT: 37.8 % — ABNORMAL LOW (ref 39.0–52.0)
Hemoglobin: 12.1 g/dL — ABNORMAL LOW (ref 13.0–17.0)
MCH: 30.5 pg (ref 26.0–34.0)
MCHC: 32 g/dL (ref 30.0–36.0)
MCV: 95.2 fL (ref 80.0–100.0)
Platelets: 134 10*3/uL — ABNORMAL LOW (ref 150–400)
RBC: 3.97 MIL/uL — ABNORMAL LOW (ref 4.22–5.81)
RDW: 14.4 % (ref 11.5–15.5)
WBC: 3.1 10*3/uL — ABNORMAL LOW (ref 4.0–10.5)
nRBC: 0 % (ref 0.0–0.2)

## 2021-03-20 LAB — ECHOCARDIOGRAM COMPLETE
AV Peak grad: 4.2 mmHg
Ao pk vel: 1.03 m/s
Area-P 1/2: 2.51 cm2
Height: 71 in
S' Lateral: 2.67 cm
Weight: 2502.4 oz

## 2021-03-20 LAB — BASIC METABOLIC PANEL
Anion gap: 4 — ABNORMAL LOW (ref 5–15)
BUN: 22 mg/dL (ref 8–23)
CO2: 26 mmol/L (ref 22–32)
Calcium: 9 mg/dL (ref 8.9–10.3)
Chloride: 103 mmol/L (ref 98–111)
Creatinine, Ser: 1.04 mg/dL (ref 0.61–1.24)
GFR, Estimated: >60 mL/min (ref >60)
Glucose, Bld: 187 mg/dL — ABNORMAL HIGH (ref 70–99)
Potassium: 4.9 mmol/L (ref 3.5–5.1)
Sodium: 133 mmol/L — ABNORMAL LOW (ref 135–145)

## 2021-03-20 MED ORDER — PNEUMOCOCCAL VAC POLYVALENT 25 MCG/0.5ML IJ INJ: 0.5000 mL | INJECTION | INTRAMUSCULAR | Status: DC

## 2021-03-20 MED ORDER — PNEUMOCOCCAL VAC POLYVALENT 25 MCG/0.5ML IJ INJ
0.5000 mL | INJECTION | Freq: Once | INTRAMUSCULAR | Status: AC
  Administered 2021-03-20: 0.5 mL via INTRAMUSCULAR
  Filled 2021-03-20: qty 0.5

## 2021-03-20 MED FILL — Heparin Sod (Porcine)-NaCl IV Soln 2000 Unit/L-0.9%: INTRAVENOUS | Qty: 3000 | Status: AC

## 2021-03-20 NOTE — Discharge Summary (Addendum)
[facility] TEAM  Discharge Summary    Patient ID: John Holder MRN: [id number]; DOB: [birth year]  Admit date: 03/19/2021 Discharge date: 03/20/2021  Primary Care Provider: Burnard Bunting, MD  Primary Cardiologist: Mertie Moores, MD / Dr. Burt Knack (Structural)  Discharge Diagnoses    Principal Problem:   S/P mitral valve clip implantation Active Problems:   Atrial fibrillation, chronic (HCC)   HTN (hypertension)   Pulmonary hypertension ([place])   Non-rheumatic mitral regurgitation   Allergies No Known Allergies  Diagnostic Studies/Procedures    03/19/21 MITRAL VALVE REPAIR   Conclusion  Successful transcatheter edge to edge repair of the mitral valve, with placement of a MitraClip G4 NTW device A2/P1, reducing 4+ MR at baseline to 1+ MR after device deployment   Plan: Resume apixaban tomorrow morning Post-procedure echo tomorrow   _____________    Echo 03/20/21: completed but pending formal read at the time of discharge   History of Present Illness     John Holder is a 87 y.o. male with a history persistent atrial fibrillation on Eliquis, progressive symptoms of diastolic heart failure, HTN, GERD, left renal mass s/p L nephrectomy, pulmonary hypertension, and severe tricuspid and mitral regurgitation with recent TEER with single leaflet device attachment to the anterior leaflet and recurrent severe mitral regurgitation who presented to [facility] on 02/26/21 for repeat mitral valve TEER.   The patient has a history of chronic persistent atrial fibrillation, worsening diastolic heart failure, pulmonary hypertension, and severe mitral and tricuspid regurgitation.  LVEF has been in the range of 55 to 60%.  The patient has severe prolapse and partial flail of the P3 segment of the posterior mitral valve leaflet.  He was initially evaluated January 29, 2021 when he underwent transcatheter edge-to-edge repair of the mitral valve February 26, 2021.  A single MitraClip NTW device was placed and the patient initially had excellent reduction in mitral regurgitation.  However, the following morning, he was noted to have a recurrent loud heart murmur after initially achieving complete resolution of his heart murmur.  His postoperative day #1 echo demonstrated findings consistent with single leaflet device attachment to the anterior leaflet and recurrent severe mitral regurgitation.  He underwent transesophageal echo that confirmed this finding with the MitraClip device attached only to the A2 segment of the anterior leaflet. He recalled feeling really well when he woke up in the morning after his MitraClip procedure, but then his symptoms returned now back to baseline.  His case was extensively reviewed with our multidisciplinary valve team as well as Dr. Bridgette Habermann at [facility] and it was ultimately decided to bring him back to [facility] for repeat TEER with Dr. Burt Knack, which was set up for 03/19/21.  Hospital Course     Consultants: none    Severe MR: s/p successful transcatheter edge to edge repair of the mitral valve, with placement of a MitraClip G4 NTW device A2/P1, reducing 4+ MR at baseline to 1+ MR after device deployment. He has been resumed on home Eliquis. Post op echo pending formal review but clip appears in place with reduction in MR to mild with a mean gradient of 4 mm hg. Discharge home today with follow up in the office with Dr. Burt Knack on 11/15  Chronic afib: rate well controlled. Continue Toprol XL BID. Resumed on Eliquis  HTN: resumed on home meds.  _____________  Discharge Vitals Blood pressure (!) 138/99, pulse 83, temperature 98 F (36.7 C), temperature source Oral, resp.  rate 17, height 5\' 11"  (1.803 m), weight 70.9 kg, SpO2 93 %.  Filed Weights   03/19/21 1157  Weight: 70.9 kg     GEN: Well nourished, well developed, in no acute distress HEENT: normal Neck: no JVD or masses Cardiac: irreg irreg; very soft holosystolic  murmur at apex. No rubs, or gallops,no edema  Respiratory:  clear to auscultation bilaterally, normal work of breathing GI: soft, nontender, nondistended, + BS MS: no deformity or atrophy Skin: warm and dry, no rash.  Groin sites clear without hematoma or ecchymosis  Neuro:  Alert and Oriented x 3, Strength and sensation are intact Psych: euthymic mood, full affect   Labs & Radiologic Studies    CBC Recent Labs    03/17/21 1144 03/20/21 0103  WBC 6.1 3.1*  HGB 13.7 12.1*  HCT 41.9 37.8*  MCV 94.4 95.2  PLT 135* 962*   Basic Metabolic Panel Recent Labs    03/17/21 1144 03/20/21 0103  NA 132* 133*  K 4.6 4.9  CL 103 103  CO2 21* 26  GLUCOSE 118* 187*  BUN 17 22  CREATININE 1.00 1.04  CALCIUM 8.8* 9.0   Liver Function Tests Recent Labs    03/17/21 1144  AST 38  ALT 21  ALKPHOS 74  BILITOT 1.7*  PROT 6.4*  ALBUMIN 3.7   No results for input(s): LIPASE, AMYLASE in the last 72 hours. Cardiac Enzymes No results for input(s): CKTOTAL, CKMB, CKMBINDEX, TROPONINI in the last 72 hours. BNP Invalid input(s): POCBNP D-Dimer No results for input(s): DDIMER in the last 72 hours. Hemoglobin A1C Recent Labs    03/17/21 1144  HGBA1C 5.4   Fasting Lipid Panel No results for input(s): CHOL, HDL, LDLCALC, TRIG, CHOLHDL, LDLDIRECT in the last 72 hours. Thyroid Function Tests No results for input(s): TSH, T4TOTAL, T3FREE, THYROIDAB in the last 72 hours.  Invalid input(s): FREET3 _____________  DG Chest 2 View  Result Date: 03/17/2021 CLINICAL DATA:  Pre-admission for mitral clip. Severe mitral insufficiency. EXAM: CHEST - 2 VIEW COMPARISON:  Chest radiograph dated 02/23/2021. FINDINGS: No focal consolidation, pleural effusion, pneumothorax. Stable cardiomegaly. A clip is noted over the heart. No acute osseous pathology. IMPRESSION: No active cardiopulmonary disease. Electronically Signed   By: Anner Crete M.D.   On: 03/17/2021 21:51   DG Chest 2 View  Result  Date: 02/23/2021 CLINICAL DATA:  Mitral insufficiency. Preop for mitral valve repair. EXAM: CHEST - 2 VIEW COMPARISON:  12/31/2020 FINDINGS: Lower thoracic moderate compression deformity is similar and suboptimally evaluated. Midline trachea. moderate cardiomegaly. Mediastinal contours otherwise within normal limits. No pleural effusion or pneumothorax. No congestive failure. Clear lungs. IMPRESSION: No acute cardiopulmonary disease. Cardiomegaly without congestive failure. Electronically Signed   By: Abigail Miyamoto M.D.   On: 02/23/2021 16:52   CARDIAC CATHETERIZATION  Result Date: 03/20/2021 Successful transcatheter edge to edge repair of the mitral valve, with placement of a MitraClip G4 NTW device A2/P1, reducing 4+ MR at baseline to 1+ MR after device deployment Plan: Resume apixaban tomorrow morning Post-procedure echo tomorrow   CARDIAC CATHETERIZATION  Result Date: 02/26/2021 Successful transcatheter edge-to-edge repair of the mitral valve with a single G4 NT W device, reducing severe 4+ mitral regurgitation to mild 1+ mitral regurgitation, MitraClip placement A3/P3. Recommend: Overnight observation, limited repeat echocardiogram tomorrow morning, resume apixaban tonight at normal dosing schedule.   ECHO TEE  Result Date: 03/19/2021    TRANSESOPHOGEAL ECHO REPORT   Patient Name:   MAREK NGHIEM Date of Exam: 03/19/2021  Medical Rec #:  [id number]    Height:       71.0 in Accession #:    [id number]   Weight:       156.4 lb Date of Birth:  [birth year]    BSA:          1.899 m Patient Age:    22 years     BP:           125/66 mmHg Patient Gender: M            HR:           88 bpm. Exam Location:  Inpatient Procedure: Transesophageal Echo, Cardiac Doppler, Color Doppler and 3D Echo Indications:    MitraClip intraop  History:        Patient has prior history of Echocardiogram examinations.                 Previous MitraClip 02/26/2021; Mitral Valve Prolapse.  Sonographer:    Darlina Sicilian RDCS Referring  Phys: [phone] Xadrian Craighead PROCEDURE: The transesophogeal probe was passed without difficulty through the esophogus of the patient. Sedation performed by performing physician. Image quality was good. The patient's vital signs; including heart rate, blood pressure, and oxygen saturation; remained stable throughout the procedure. The patient developed no complications during the procedure. TEE with live 3D and 3D postprocessing was performed throughout all stages of the procedure, including transseptal crossing, device deployment, assessment of final results and evaluation for complications  PREPROCEDURE FINDINGS: At baseline, left ventricular systolic function is normal. Estimated LVEF is 65%. There is normal regional wall motion. There is severe myxomatous degeneration with prolapse of both mitral leaflets. The worst prolapse and the dominant jet of mitral insufficiency is due to severe prolapse of the medial scallop (P3) of the posterior leaflet. A previously placed Mitraclip is  seen attached firmly to the A2 portion of the anterior mitral leaflet, but is detached from the posterior leaflet. There is severe mitral insuficiency with an eccentric jet, posteriorly directed. Baseline valve area by planimetry 6.2 cm sq. Mean gradient 2 mm Hg at 80 bpm. ERO area by PISA was 0.5 cm sq. Regurgitant volume 77 ml, regurgitant fraction 69 %. Vena contracta 6 mm. There is bilateral late systolic reversal of pulmonary vein late systolic flow. A very small residual iatrogenic secundum ASD is still present from the previous procedure. There is no pericardial effusion. POSTPROCEDURE FINDINGS:  A new MitraClip NTW was placed medially to the previous clip, and appears firmly attached to both leaflets with a good tissue bridge. There is mild residual mitral insufficiency, lateral to the previously placed clip. Mean mitral valve gradient is 5 mm Hg at 90 bpm. There is forward flow throughout systole and diastole in the right and left  pulmonary veins (atrial fibrillation). There is unchanged left ventricular systolic function and regional wall motion, LVEF 65%. There is a small iatrogenic secundum ASD with predominantly left to right shunt. There is no pericardial effusion.  IMPRESSIONS  1. Left ventricular ejection fraction, by estimation, is 65%. The left ventricle has normal function. The left ventricle has no regional wall motion abnormalities. Left ventricular diastolic function could not be evaluated.  2. Right ventricular systolic function is normal. The right ventricular size is mildly enlarged. There is mildly elevated pulmonary artery systolic pressure. The estimated right ventricular systolic pressure is 62.1 mmHg.  3. Left atrial size was severely dilated. No left atrial/left atrial appendage thrombus was detected.  4. Right atrial size was severely  dilated.  5. The mitral valve is myxomatous. Severe mitral valve regurgitation. There is severe holosystolic prolapse of the medial scallop of the posterior leaflet of the mitral valve.  6. The tricuspid valve is myxomatous. Tricuspid valve regurgitation is moderate to severe.  7. The aortic valve is tricuspid. Aortic valve regurgitation is not visualized. Mild aortic valve sclerosis is present, with no evidence of aortic valve stenosis.  8. Evidence of atrial level shunting detected by color flow Doppler. There is a small patent foramen ovale with predominantly left to right shunting across the atrial septum. FINDINGS  Left Ventricle: Left ventricular ejection fraction, by estimation, is 65%. The left ventricle has normal function. The left ventricle has no regional wall motion abnormalities. The left ventricular internal cavity size was normal in size. There is no left ventricular hypertrophy. Left ventricular diastolic function could not be evaluated due to atrial fibrillation. Left ventricular diastolic function could not be evaluated. Right Ventricle: The right ventricular size is  mildly enlarged. No increase in right ventricular wall thickness. Right ventricular systolic function is normal. There is mildly elevated pulmonary artery systolic pressure. The tricuspid regurgitant velocity is 3.03 m/s, and with an assumed right atrial pressure of 5 mmHg, the estimated right ventricular systolic pressure is 62.2 mmHg. Left Atrium: Left atrial size was severely dilated. No left atrial/left atrial appendage thrombus was detected. Right Atrium: Right atrial size was severely dilated. Pericardium: There is no evidence of pericardial effusion. Mitral Valve: The mitral valve is myxomatous. There is severe holosystolic prolapse of the medial scallop of the posterior leaflet of the mitral valve. There is moderate thickening of the mitral valve leaflet(s). Severe mitral valve regurgitation, with eccentric laterally directed jet. MV peak gradient, 7.7 mmHg. The mean mitral valve gradient is 2.0 mmHg. Tricuspid Valve: The tricuspid valve is myxomatous. Tricuspid valve regurgitation is moderate to severe. There is moderate holosystolic prolapse of the tricuspid anterior and posterior leaflets. Aortic Valve: The aortic valve is tricuspid. Aortic valve regurgitation is not visualized. Mild aortic valve sclerosis is present, with no evidence of aortic valve stenosis. Pulmonic Valve: The pulmonic valve was normal in structure. Pulmonic valve regurgitation is not visualized. Aorta: The aortic root, ascending aorta and aortic arch are all structurally normal, with no evidence of dilitation or obstruction. IAS/Shunts: Evidence of atrial level shunting detected by color flow Doppler. A small patent foramen ovale is detected with predominantly left to right shunting across the atrial septum.  AORTIC VALVE LVOT Vmax:   82.80 cm/s LVOT Vmean:  47.800 cm/s LVOT VTI:    0.123 m MITRAL VALVE            TRICUSPID VALVE MV Peak grad: 7.7 mmHg  TR Peak grad:   36.7 mmHg MV Mean grad: 2.0 mmHg  TR Vmax:        303.00 cm/s MV  Vmax:      1.39 m/s MV Vmean:     64.4 cm/s SHUNTS                         Systemic VTI: 0.12 m Sanda Klein MD Electronically signed by Sanda Klein MD Signature Date/Time: 03/19/2021/5:07:07 PM    Final    ECHO TEE  Result Date: 02/26/2021    TRANSESOPHOGEAL ECHO REPORT   Patient Name:   John Holder Date of Exam: 02/26/2021 Medical Rec #:  [id number]    Height:       71.0 in Accession #:  [id number]   Weight:       155.0 lb Date of Birth:  1931-11-03    BSA:          1.892 m Patient Age:    24 years     BP:           96/62 mmHg Patient Gender: M            HR:           51 bpm. Exam Location:  Inpatient Procedure: Transesophageal Echo, 3D Echo, Cardiac Doppler and Color Doppler Indications:     Severe mitral insufficiency  History:         Patient has prior history of Echocardiogram examinations, most                  recent 01/28/2021. CHF, Pulmonary HTN, Mitral Valve Disease,                  Arrythmias:Atrial Fibrillation; Risk Factors:Hypertension and                  Sleep Apnea. GERD.                   Mitral Valve: G4 NT W Mitral Clip A3/P3 valve is present in the                  mitral position. Procedure Date: 02/16/2021.  Sonographer:     Darlina Sicilian RDCS Referring Phys:  [place] Diagnosing Phys: Jenkins Rouge MD PROCEDURE: After discussion of the risks and benefits of a TEE, an informed consent was obtained from the patient. The patient was intubated. The transesophogeal probe was passed without difficulty through the esophogus of the patient. Imaged were obtained with the patient in a supine position. Sedation performed by different physician. The patient was monitored while under deep sedation. Anesthestetic sedation was provided intravenously by Anesthesiology: 110mg  of Propofol, 60mg  of Lidocaine. Image quality was good. The patient's vital signs; including heart rate, blood pressure, and oxygen saturation; remained stable throughout the procedure. The patient developed no  complications during the procedure. IMPRESSIONS  1. Post deployment and equipment removal no new pericardial effusion and stable clip position on fluorscopy.  2. Post deployment there was an increase in LAA "smoke" no thrombus and inital EF was maintatined.  3. Post transeptal there was a mild bi-directional shunt.  4. Using extensive 3D echo guidance a single NTW clip was placed medially between the A3 and P3 scallops. Leaflet capture was excellent with good 3D bridge. With full closure of clip residual MR was only 0.5/4 mild MRThe Mean gradient across the valve was only 2 mmHg at HR 80 bpm and the combined oriface area 3.1 cm2.  5. Left ventricular ejection fraction, by estimation, is 55 to 60%. The left ventricle has normal function.  6. Right ventricular systolic function is moderately reduced. The right ventricular size is moderately enlarged.  7. Left atrial size was moderately dilated. No left atrial/left atrial appendage thrombus was detected.  8. Right atrial size was moderately dilated.  9. Pre Clip: Severe eccentric anteriorly directed MR. Severe prolapse of large segment P3 scallop . The mitral valve is abnormal. Severe mitral valve regurgitation. There is a G4 NT W Mitral Clip A3/P3 present in the mitral position. Procedure Date: 02/16/2021. 10. Dilated Annulus . The tricuspid valve is abnormal. Tricuspid valve regurgitation is severe. 11. The aortic valve is tricuspid. Aortic valve regurgitation is not visualized. Mild aortic  valve sclerosis is present, with no evidence of aortic valve stenosis. FINDINGS  Left Ventricle: Left ventricular ejection fraction, by estimation, is 55 to 60%. The left ventricle has normal function. The left ventricular internal cavity size was normal in size. There is no left ventricular hypertrophy. Right Ventricle: The right ventricular size is moderately enlarged. Right vetricular wall thickness was not assessed. Right ventricular systolic function is moderately reduced.  Left Atrium: Left atrial size was moderately dilated. No left atrial/left atrial appendage thrombus was detected. Right Atrium: Right atrial size was moderately dilated. Pericardium: There is no evidence of pericardial effusion. Mitral Valve: Pre Clip: Severe eccentric anteriorly directed MR. Severe prolapse of large segment P3 scallop. The mitral valve is abnormal. Severe mitral valve regurgitation. There is a G4 NT W Mitral Clip A3/P3 present in the mitral position. Procedure Date: 02/16/2021. MV peak gradient, 6.5 mmHg. The mean mitral valve gradient is 2.0 mmHg. Tricuspid Valve: Dilated Annulus. The tricuspid valve is abnormal. Tricuspid valve regurgitation is severe. Aortic Valve: The aortic valve is tricuspid. Aortic valve regurgitation is not visualized. Mild aortic valve sclerosis is present, with no evidence of aortic valve stenosis. Pulmonic Valve: The pulmonic valve was normal in structure. Pulmonic valve regurgitation is mild. Aorta: The aortic root is normal in size and structure. IAS/Shunts: No atrial level shunt detected by color flow Doppler. Additional Comments: Using extensive 3D echo guidance a single NTW clip was placed medially between the A3 and P3 scallops. Leaflet capture was excellent with good 3D bridge. With full closure of clip residual MR was only 0.5/4 mild MRThe Mean gradient across the valve was only 2 mmHg at HR 80 bpm and the combined oriface area 3.1 cm2. Post transeptal there was a mild bi-directional shunt. Post deployment there was an increase in LAA "smoke" no thrombus and inital EF was maintatined. Post deployment and equipment removal no new pericardial effusion and stable clip position on fluorscopy.  LEFT VENTRICLE PLAX 2D LVOT diam:     1.90 cm LVOT Area:     2.84 cm  MITRAL VALVE                  TRICUSPID VALVE MV Peak grad: 6.5 mmHg        TR Peak grad:   51.3 mmHg MV Mean grad: 2.0 mmHg        TR Vmax:        358.00 cm/s MV Vmax:      1.27 m/s MV Vmean:     62.2 cm/s        SHUNTS MR Peak grad:    82.3 mmHg    Systemic Diam: 1.90 cm MR Mean grad:    55.0 mmHg MR Vmax:         453.50 cm/s MR Vmean:        355.0 cm/s MR PISA:         19.24 cm MR PISA Eff ROA: 182 mm MR PISA Radius:  1.75 cm Jenkins Rouge MD Electronically signed by Jenkins Rouge MD Signature Date/Time: 02/26/2021/12:52:27 PM    Final    ECHOCARDIOGRAM LIMITED  Result Date: 02/27/2021    ECHOCARDIOGRAM LIMITED REPORT   Patient Name:   JAEVIAN SHEAN Date of Exam: 02/27/2021 Medical Rec #:  [id number]    Height:       71.0 in Accession #:    [id number]   Weight:       155.0 lb Date of Birth:  1931/05/28    BSA:  1.892 m Patient Age:    73 years     BP:           149/95 mmHg Patient Gender: M            HR:           82 bpm. Exam Location:  Inpatient Procedure: Limited Echo, 3D Echo, Color Doppler and Cardiac Doppler Indications:    Post MitraClip Evaluation z98.89  History:        Patient has prior history of Echocardiogram examinations, most                 recent 02/26/2021. Pulmonary HTN, Arrythmias:Atrial                 Fibrillation; Risk Factors:Hypertension and Sleep Apnea. 1NTW                 MitraClip placed A3P3 02/26/21.  Sonographer:    Raquel Sarna Senior RDCS Referring Phys: [place]  1. The mitral valve has been repaired via TEER approach a NTW MitraClip placed at the A3P3 position. There is at least moderate eccentric mitral valve regurgitation. The is a return of the eccentric regurgitation seen prior to procedure. Pulmonary vein blunting. Saved images in [facility] report concerning for lack of posterior clip attachment. No evidence of mitral stenosis. The mean mitral valve gradient is 2.0 mmHg with average heart rate of 57 bpm.  2. Left ventricular ejection fraction, by estimation, is 55 to 60%. The left ventricle has normal function. The left ventricle has no regional wall motion abnormalities.  3. Right ventricular systolic function is moderately reduced. The right  ventricular size is moderately enlarged. There is severely elevated pulmonary artery systolic pressure.  4. Left atrial size was severely dilated.  5. Right atrial size was severely dilated.  6. A small pericardial effusion is present.  7. Tricuspid valve regurgitation is severe.  8. Birectional shunt present post transeptal puncture.  9. The aortic valve is tricuspid. Aortic valve regurgitation is not visualized. No aortic stenosis is present. Comparison(s): A prior study was performed on 02/26/21. Worsened mitral regurgitation. Conclusion(s)/Recommendation(s): Interval TEE has been performed confirming lack of P2 Coaptation. FINDINGS  Left Ventricle: Left ventricular ejection fraction, by estimation, is 55 to 60%. The left ventricle has normal function. The left ventricle has no regional wall motion abnormalities. Right Ventricle: The right ventricular size is moderately enlarged. Right vetricular wall thickness was not well visualized. Right ventricular systolic function is moderately reduced. There is severely elevated pulmonary artery systolic pressure. The tricuspid regurgitant velocity is 3.43 m/s, and with an assumed right atrial pressure of 15 mmHg, the estimated right ventricular systolic pressure is 37.9 mmHg. Left Atrium: Left atrial size was severely dilated. Right Atrium: Right atrial size was severely dilated. Pericardium: A small pericardial effusion is present. Mitral Valve: The mitral valve has been repaired/replaced. Moderate mitral valve regurgitation. No evidence of mitral valve stenosis. MV peak gradient, 11.0 mmHg. The mean mitral valve gradient is 2.0 mmHg with average heart rate of 57 bpm. Tricuspid Valve: The tricuspid valve is normal in structure. Tricuspid valve regurgitation is severe. Aortic Valve: The aortic valve is tricuspid. Aortic valve regurgitation is not visualized. No aortic stenosis is present. IAS/Shunts: Evidence of atrial level shunting detected by color flow Doppler. LEFT  VENTRICLE PLAX 2D LVOT diam:     2.10 cm LV SV:         47 LV SV Index:   25 LVOT Area:  3.46 cm  LEFT ATRIUM              Index LA Vol (A2C):   155.0 ml 81.92 ml/m LA Vol (A4C):   98.2 ml  51.90 ml/m LA Biplane Vol: 128.0 ml 67.65 ml/m  AORTIC VALVE LVOT Vmax:   87.43 cm/s LVOT Vmean:  54.067 cm/s LVOT VTI:    0.136 m MITRAL VALVE             TRICUSPID VALVE MV Area VTI:  1.10 cm   TR Peak grad:   47.1 mmHg MV Peak grad: 11.0 mmHg  TR Vmax:        343.00 cm/s MV Mean grad: 2.0 mmHg MV Vmax:      1.66 m/s   SHUNTS MV Vmean:     65.5 cm/s  Systemic VTI:  0.14 m                          Systemic Diam: 2.10 cm Rudean Haskell MD Electronically signed by Rudean Haskell MD Signature Date/Time: 02/27/2021/6:03:48 PM    Final    Disposition   Pt is being discharged home today in good condition.  Follow-up Plans & Appointments     Follow-up Information     [name], MD. Go on 03/31/2021.   Specialty: Cardiology Why: @ 1:40pm Contact information: [address] [place] [address] [phone]                Discharge Instructions     AMB Referral to Cardiac Rehabilitation - Phase II   Complete by: As directed    Diagnosis: Valve Repair   Valve: Mitral   After initial evaluation and assessments completed: Virtual Based Care may be provided alone or in conjunction with Phase 2 Cardiac Rehab based on patient barriers.: Yes       Discharge Medications   Allergies as of 03/20/2021   No Known Allergies      Medication List     STOP taking these medications    naproxen sodium 220 MG tablet Commonly known as: ALEVE       TAKE these medications    ALPRAZolam 0.25 MG tablet Commonly known as: XANAX Take 1 tablet (0.25 mg total) by mouth every 6 (six) hours as needed for anxiety   BERBERINE COMPLEX PO Take 2 capsules by mouth daily.   Chromium 400 MCG Tabs Take 400 mcg by mouth daily.   ciclopirox 8 % solution Commonly known  as: PENLAC Apply 1 application topically at bedtime as needed (toe nail fungus).   Co Q10 200 MG Caps Take 200 mg by mouth daily.   Eliquis 5 MG Tabs tablet Generic drug: apixaban TAKE 1 TABLET BY MOUTH TWICE DAILY   empagliflozin 10 MG Tabs tablet Commonly known as: Jardiance Take 1 tablet (10 mg total) by mouth daily before breakfast.   ferrous gluconate 324 MG tablet Commonly known as: FERGON Take 324 mg by mouth daily with breakfast.   furosemide 40 MG tablet Commonly known as: LASIX Take 1 tablet (40 mg total) by mouth daily.   glucosamine-chondroitin 500-400 MG tablet Take 1 tablet by mouth 2 (two) times daily.   KRILL OIL PO Take 1 capsule by mouth daily at 12 noon.   Linoleic Acid Conjugated 1000 MG Caps Take 1,000 mg by mouth in the morning and at bedtime.   losartan 50 MG tablet Commonly known as: COZAAR TAKE 1 TABLET BY MOUTH EVERY  DAY What changed: how much to take   Magnesium Glycinate 665 MG Caps Take 665 mg by mouth in the morning and at bedtime.   melatonin 5 MG Tabs Take 5 mg by mouth at bedtime.   metoprolol succinate 50 MG 24 hr tablet Commonly known as: TOPROL-XL TAKE 1 TABLET (50 MG TOTAL) BY MOUTH 2 (TWO) TIMES DAILY. TAKE WITH OR IMMEDIATELY FOLLOWING A MEAL. What changed:  how much to take how to take this when to take this   multivitamin capsule Take 2 capsules by mouth daily.   NON FORMULARY Take 1 capsule by mouth 2 (two) times daily. TRU vitamin   omeprazole 20 MG capsule Commonly known as: PRILOSEC Take 1 capsule (20 mg total) by mouth daily.   OVER THE COUNTER MEDICATION Take 2 capsules by mouth daily. Prostacare   OVER THE COUNTER MEDICATION Take 2 capsules by mouth daily. Uricare   potassium chloride SA 20 MEQ tablet Commonly known as: KLOR-CON Take 2 tablets (40 mEq total) by mouth daily.   PREVAGEN EXTRA STRENGTH PO Take 1 tablet by mouth daily.   tadalafil 20 MG tablet Commonly known as: CIALIS TAKE ONE  TABLET BY MOUTH DAILY AS NEEDED What changed: reasons to take this          Outstanding Labs/Studies   none  Duration of Discharge Encounter   Greater than 30 minutes including physician time.  SignedAngelena Form, PA-C 03/20/2021, 10:01 AM [phone]  Patient seen, examined. Available data reviewed. Agree with findings, assessment, and plan as outlined by Nell Range, PA-C.  The patient is independently interviewed and examined the morning of 11/4 prior to his discharge.  He is awake and alert, in no distress.  HEENT is normal, JVP is normal, lungs are clear bilaterally, heart is irregularly irregular with grade 2/6 systolic murmur at the left lower sternal border and apex.  Abdomen is soft and nontender, right groin site is clear, lower extremities have some chronic stasis changes with no current evidence of edema.  The patient's echocardiogram is personally reviewed and shows appropriate position of the MitraClip device with mild residual mitral regurgitation.  The patient is restarted on apixaban as well as his oral diuretic regimen.  He has done very well with repeat transcatheter edge-to-edge repair of the mitral valve for treatment of progressive diastolic heart failure including right heart failure in the setting of severe mitral regurgitation.  The structural heart team will see him back in about 1 month for his 30-day echocardiogram and office visit.  [name], M.D. 03/22/2021 9:51 AM

## 2021-03-20 NOTE — Plan of Care (Signed)

## 2021-03-20 NOTE — Progress Notes (Signed)
15 minute post vaccination observation window complete. Pt denies itching, SOB, or swelling in face, tongue, throat.

## 2021-03-20 NOTE — Plan of Care (Signed)
  Problem: Education: Goal: Knowledge of General Education information will improve Description: Including pain rating scale, medication(s)/side effects and non-pharmacologic comfort measures 03/20/2021 1207 by Leonie Man, RN Outcome: Adequate for Discharge 03/20/2021 0926 by Leonie Man, RN Outcome: Progressing   Problem: Health Behavior/Discharge Planning: Goal: Ability to manage health-related needs will improve 03/20/2021 1207 by Leonie Man, RN Outcome: Adequate for Discharge 03/20/2021 0926 by Leonie Man, RN Outcome: Progressing   Problem: Clinical Measurements: Goal: Ability to maintain clinical measurements within normal limits will improve 03/20/2021 1207 by Leonie Man, RN Outcome: Adequate for Discharge 03/20/2021 0926 by Leonie Man, RN Outcome: Progressing Goal: Will remain free from infection 03/20/2021 1207 by Leonie Man, RN Outcome: Adequate for Discharge 03/20/2021 0926 by Leonie Man, RN Outcome: Progressing Goal: Diagnostic test results will improve 03/20/2021 1207 by Leonie Man, RN Outcome: Adequate for Discharge 03/20/2021 0926 by Leonie Man, RN Outcome: Progressing Goal: Respiratory complications will improve 03/20/2021 1207 by Leonie Man, RN Outcome: Adequate for Discharge 03/20/2021 0926 by Leonie Man, RN Outcome: Progressing Goal: Cardiovascular complication will be avoided 03/20/2021 1207 by Leonie Man, RN Outcome: Adequate for Discharge 03/20/2021 0926 by Leonie Man, RN Outcome: Progressing   Problem: Activity: Goal: Risk for activity intolerance will decrease 03/20/2021 1207 by Leonie Man, RN Outcome: Adequate for Discharge 03/20/2021 0926 by Leonie Man, RN Outcome: Progressing   Problem: Nutrition: Goal: Adequate nutrition will be maintained 03/20/2021 1207 by Leonie Man, RN Outcome: Adequate for Discharge 03/20/2021 0926 by Leonie Man, RN Outcome: Progressing   Problem: Coping: Goal: Level of anxiety  will decrease 03/20/2021 1207 by Leonie Man, RN Outcome: Adequate for Discharge 03/20/2021 0926 by Leonie Man, RN Outcome: Progressing   Problem: Elimination: Goal: Will not experience complications related to bowel motility 03/20/2021 1207 by Leonie Man, RN Outcome: Adequate for Discharge 03/20/2021 0926 by Leonie Man, RN Outcome: Progressing Goal: Will not experience complications related to urinary retention 03/20/2021 1207 by Leonie Man, RN Outcome: Adequate for Discharge 03/20/2021 0926 by Leonie Man, RN Outcome: Progressing   Problem: Pain Managment: Goal: General experience of comfort will improve 03/20/2021 1207 by Leonie Man, RN Outcome: Adequate for Discharge 03/20/2021 0926 by Leonie Man, RN Outcome: Progressing   Problem: Safety: Goal: Ability to remain free from injury will improve 03/20/2021 1207 by Leonie Man, RN Outcome: Adequate for Discharge 03/20/2021 0926 by Leonie Man, RN Outcome: Progressing   Problem: Skin Integrity: Goal: Risk for impaired skin integrity will decrease 03/20/2021 1207 by Leonie Man, RN Outcome: Adequate for Discharge 03/20/2021 0926 by Leonie Man, RN Outcome: Progressing

## 2021-03-20 NOTE — Discharge Instructions (Signed)
Home Care Following Your MitraClip Procedure      If you have any questions or concerns you can call the structural heart office at [phone] during normal business hours 8am-4pm. If you have an urgent need after hours or on the weekend, please call [phone] to talk to the on call provider for general cardiology. If you have an emergency that requires immediate attention, please call 911.   Groin Site Care Refer to this sheet in the next few weeks. These instructions provide you with information on caring for yourself after your procedure. Your caregiver may also give you more specific instructions. Your treatment has been planned according to current medical practices, but problems sometimes occur. Call your caregiver if you have any problems or questions after your procedure. HOME CARE INSTRUCTIONS  You may shower 24 hours after the procedure. Remove the bandage (dressing) and gently wash the site with plain soap and water. Gently pat the site dry.   Do not apply powder or lotion to the site.   Do not sit in a bathtub, swimming pool, or whirlpool for 5 to 7 days.   No bending, squatting, or lifting anything over 10 pounds (4.5 kg) as directed by your caregiver.   Inspect the site at least twice daily.   Do not drive home if you are discharged the same day of the procedure. Have someone else drive you.   You may drive 72 hours after the procedure unless otherwise instructed by your caregiver.  What to expect:  Any bruising will usually fade within 1 to 2 weeks.   Blood that collects in the tissue (hematoma) may be painful to the touch. It should usually decrease in size and tenderness within 1 to 2 weeks.  SEEK IMMEDIATE MEDICAL CARE IF:  You have unusual pain at the groin site or down the affected leg.   You have redness, warmth, swelling, or pain at the groin site.   You have drainage (other than a small amount of blood on the dressing).   You have chills.   You have a  fever or persistent symptoms for more than 72 hours.   You have a fever and your symptoms suddenly get worse.   Your leg becomes pale, cool, tingly, or numb.   You have bleeding from the site. Hold pressure on the site until it subsides.    After MitraClip Checklist  Check  Test Description   Follow up appointment in 1-2 weeks  Most of our patients will see our structural heart physician assistant, Katie Marciana Uplinger, or your primary cardiologist within 1-2 weeks. Your incision site will be checked and you will be cleared to resume all normal activities if you are doing well.     1 month echo and follow up  You will have an echo to check on your heart valve clip and be seen back in the office by Katie Bastien Strawser PA-C.   Follow up with your primary cardiologist You will need to be seen by your primary cardiologist in the following 3-6 months after your 1 month appointment in the valve clinic. Often times your Plavix or Aspirin will be discontinued during this time, but this is decided on a case by case basis.    1 year echo and follow up You will have another echo to check on your heart valve after one year and be seen back in the office by Katie Darlyne Schmiesing. This your last structural heart visit.   Bacterial endocarditis prophylaxis  You will   have to take antibiotics for the rest of your life before all dental procedures (even dental cleanings) to protect your heart valve from potential infection. Antibiotics are also required before some surgeries. Please check with your cardiologist before scheduling any surgeries. Also, please make sure to tell us if you have a penicillin allergy as you will require an alternative antibiotic.    ______________  Your Implant Identification Card Following your procedure, you will receive an Implant Identification Card, which your doctor will fill out and which you must carry with you at all times. Show your Implant Identification Card if you report to an emergency room.  This card identifies you as a patient who has had a MitraClip device implanted. If you require a magnetic resonance imaging (MRI) scan, tell your doctor or MRI technician that you have a MitraClip device implanted. Test results indicate that patients with the MitraClip device can safely undergo MRI scans under certain conditions described on the card.  

## 2021-03-20 NOTE — Progress Notes (Signed)
CARDIAC REHAB PHASE I   PRE:  Rate/Rhythm: 86 afib    BP: sitting 118/78    SaO2: 95 RA  MODE:  Ambulation: 680 ft   POST:  Rate/Rhythm: 100 afib    BP: sitting 134/78 with 7 bts VT, 5 min later 110/74    SaO2: 97 RA  Pt tolerated well, no c/o. Upon returning to bed had 7 bts VT. Asx.   Discussed walking at home, restrictions, and CRPII. Pt is eager to do CRPII and will update referral that was sent in October.  [place], ACSM 03/20/2021 10:01 AM

## 2021-03-23 ENCOUNTER — Encounter (HOSPITAL_COMMUNITY): Payer: Self-pay | Admitting: Cardiovascular Disease

## 2021-03-24 ENCOUNTER — Telehealth: Payer: Self-pay | Admitting: Physician Assistant

## 2021-03-24 NOTE — Telephone Encounter (Signed)
  [facility]   MULTIDISCIPLINARY HEART VALVE TEAM   Attempted TOC call. Left VM to call back.   Janos Shampine PA-C  [facility]     

## 2021-03-24 NOTE — Telephone Encounter (Signed)
  [facility] VALVE TEAM  Patient contacted regarding discharge from [facility] on 03/20/2021.  Patient understands to follow up with provider Dr Burt Knack on 03/31/21 at 1:40 PM at [facility] location. Patient understands discharge instructions? yes Patient understands medications and regiment? yes Patient understands to bring all medications to this visit? Yes  The pt is feeling well and walking daily without symptoms of SOB.

## 2021-03-25 ENCOUNTER — Other Ambulatory Visit (HOSPITAL_COMMUNITY): Payer: Self-pay

## 2021-03-31 ENCOUNTER — Ambulatory Visit: Payer: Medicare PPO | Admitting: Cardiovascular Disease

## 2021-03-31 ENCOUNTER — Other Ambulatory Visit: Payer: Self-pay

## 2021-03-31 ENCOUNTER — Encounter: Payer: Self-pay | Admitting: Cardiovascular Disease

## 2021-03-31 VITALS — BP 118/74 | HR 72 | Ht 71.0 in | Wt 160.4 lb

## 2021-03-31 DIAGNOSIS — I5032 Chronic diastolic (congestive) heart failure: Secondary | ICD-10-CM

## 2021-03-31 DIAGNOSIS — I071 Rheumatic tricuspid insufficiency: Secondary | ICD-10-CM

## 2021-03-31 DIAGNOSIS — Z9889 Other specified postprocedural states: Secondary | ICD-10-CM

## 2021-03-31 DIAGNOSIS — Z95818 Presence of other cardiac implants and grafts: Secondary | ICD-10-CM

## 2021-03-31 NOTE — Patient Instructions (Addendum)
Medication Instructions:  Your physician recommends that you continue on your current medications as directed. Please refer to the Current Medication list given to you today.  *If you need a refill on your cardiac medications before your next appointment, please call your pharmacy*   Lab Work: None ordered   If you have labs (blood work) drawn today and your tests are completely normal, you will receive your results only by: [place] (if you have MyChart) OR A paper copy in the mail If you have any lab test that is abnormal or we need to change your treatment, we will call you to review the results.   Testing/Procedures: None ordered today   Follow-Up: Someone from our structural team will contact you when its time for you yearly appointment    Other Instructions None

## 2021-03-31 NOTE — Progress Notes (Signed)
Cardiology Office Note:    Date:  03/31/2021   ID:  John Holder, DOB 19-Jan-1932, MRN [id number]  PCP:  Burnard Bunting, MD   [facility] Providers Cardiologist:  Mertie Moores, MD     Referring MD: Burnard Bunting, MD   Chief Complaint  Patient presents with   Mitral Regurgitation    History of Present Illness:    John Holder is a 87 y.o. male with a hx of severe mitral regurgitation, chronic persistent atrial fibrillation, pulmonary hypertension, and diastolic heart failure, presenting for follow-up evaluation.  The patient was found to have severe prolapse and partial flail of the P3 segment of the posterior mitral valve leaflet.  He underwent transcatheter edge-to-edge repair of the mitral valve February 26, 2021 with a single MitraClip NTW device.  He had excellent initial reduction in mitral regurgitation, but his procedure was complicated by single leaflet device attachment the day following the procedure with recurrence of severe mitral regurgitation.  He was seen back March 05, 2021 to further review treatment options.  We elected to repeat transcatheter edge-to-edge repair of the valve after extensive multidisciplinary heart valve team review of his case.  The patient underwent his repeat MitraClip procedure 03/19/2021 with a good procedural result and a MitraClip device placed just medial to the first device.  There was mild residual mitral regurgitation at the completion of the procedure and the procedure was otherwise uncomplicated.  The patient presents today for follow-up evaluation.  He is doing very well.  He is walking for 20 to 30 minutes 2 times per day without symptoms.  He has no orthopnea, PND, leg swelling, or exertional shortness of breath.  He denies chest pain.  Past Medical History:  Diagnosis Date   Blood transfusion without reported diagnosis    Cataract    removed in replace bilateral   CHF (congestive heart failure) (HCC)    Dysrhythmia    GERD  (gastroesophageal reflux disease)    Hypertension    Iron deficiency anemia    amdx 08/2018 >> Tx with PRBCs and Feraheme started; no GI source per workup; followed by Heme (Dr. Jana Hakim) and GI (Dr. Cristina Gong)    Left renal mass    s/p L nephrectomy   Mitral regurgitation    Echo 02/2016:  Mild conc LVH, EF 55-60, mild to mod MR, mod LAE, mod to severe RAE, mod TR, PASP 41 // Echo 10/2018: EF 60-65, mod conc LVH, severe BAE, trivial eff, mild MVP with mod MR, mod to severe TR, asc aorta 36 (mild dilation)    Persistent Atrial Fibrillation    Pulmonary hypertension (HCC)    Echocardiogram 7/22: EF 60-65, no RWMA, normal RVSF, RVSP 70.7 (severe pulmonary hypertension), severe BAE, mod MR, severe TR, mild to mod AV sclerosis w/o AS   S/P mitral valve clip implantation 02/26/2021   G4 NT W device; A3/P3 with Dr. Burt Knack    Past Surgical History:  Procedure Laterality Date   CARDIOVERSION N/A 02/04/2016   Procedure: CARDIOVERSION;  Surgeon: Thayer Headings, MD;  Location: [place];  Service: Cardiovascular;  Laterality: N/A;   CHOLECYSTECTOMY     COLONOSCOPY     COLONOSCOPY WITH PROPOFOL N/A 09/10/2018   Procedure: COLONOSCOPY WITH PROPOFOL;  Surgeon: Ronald Lobo, MD;  Location: [facility];  Service: Endoscopy;  Laterality: N/A;   ESOPHAGOGASTRODUODENOSCOPY (EGD) WITH PROPOFOL N/A 09/10/2018   Procedure: ESOPHAGOGASTRODUODENOSCOPY (EGD) WITH PROPOFOL;  Surgeon: Ronald Lobo, MD;  Location: [facility];  Service: Endoscopy;  Laterality:  N/A;   EYE SURGERY     GIVENS CAPSULE STUDY N/A 09/10/2018   Procedure: GIVENS CAPSULE STUDY;  Surgeon: Ronald Lobo, MD;  Location: [facility];  Service: Endoscopy;  Laterality: N/A;   MITRAL VALVE REPAIR N/A 02/26/2021   Procedure: MITRAL VALVE REPAIR;  Surgeon: [name], MD;  Location: [place] [facility];  Service: Cardiovascular;  Laterality: N/A;   MITRAL VALVE REPAIR N/A 03/19/2021   Procedure: MITRAL VALVE REPAIR;  Surgeon: [name], MD;  Location: [place] [facility];  Service: Cardiovascular;  Laterality: N/A;   NEPHRECTOMY Left 2002   RIGHT/LEFT HEART CATH AND CORONARY ANGIOGRAPHY N/A 01/27/2021   Procedure: RIGHT/LEFT HEART CATH AND CORONARY ANGIOGRAPHY;  Surgeon: Larey Dresser, MD;  Location: [place] [facility];  Service: Cardiovascular;  Laterality: N/A;   TEE WITHOUT CARDIOVERSION N/A 01/27/2021   Procedure: TRANSESOPHAGEAL ECHOCARDIOGRAM (TEE);  Surgeon: Larey Dresser, MD;  Location: [facility]COPY;  Service: Cardiovascular;  Laterality: N/A;   TEE WITHOUT CARDIOVERSION N/A 02/26/2021   Procedure: TRANSESOPHAGEAL ECHOCARDIOGRAM (TEE);  Surgeon: [name], MD;  Location: [place] [facility];  Service: Cardiovascular;  Laterality: N/A;   TEE WITHOUT CARDIOVERSION N/A 02/27/2021   Procedure: TRANSESOPHAGEAL ECHOCARDIOGRAM (TEE);  Surgeon: Larey Dresser, MD;  Location[facility];  Service: Cardiovascular;  Laterality: N/A;   TEE WITHOUT CARDIOVERSION N/A 03/19/2021   Procedure: TRANSESOPHAGEAL ECHOCARDIOGRAM (TEE);  Surgeon: [name], MD;  Location: [place] [facility];  Service: Cardiovascular;  Laterality: N/A;   TONSILLECTOMY      Current Medications: Current Meds  Medication Sig   ALPRAZolam (XANAX) 0.25 MG tablet Take 1 tablet (0.25 mg total) by mouth every 6 (six) hours as needed for anxiety   apixaban (ELIQUIS) 5 MG TABS tablet TAKE 1 TABLET BY MOUTH TWICE DAILY   Apoaequorin (PREVAGEN EXTRA STRENGTH PO) Take 1 tablet by mouth daily.   Barberry-Oreg Grape-Goldenseal (BERBERINE COMPLEX PO) Take 2 capsules by mouth daily.   Chromium 400 MCG TABS Take 400 mcg by mouth daily.   ciclopirox (PENLAC) 8 % solution Apply 1 application topically at bedtime as needed (toe nail fungus).   Coenzyme Q10 (CO Q10) 200 MG CAPS Take 200 mg by mouth daily.   empagliflozin (JARDIANCE) 10 MG TABS tablet Take 1 tablet (10 mg total) by mouth daily before breakfast.   ferrous gluconate (FERGON) 324 MG  tablet Take 324 mg by mouth daily with breakfast.   furosemide (LASIX) 40 MG tablet Take 1 tablet (40 mg total) by mouth daily.   glucosamine-chondroitin 500-400 MG tablet Take 1 tablet by mouth 2 (two) times daily.    KRILL OIL PO Take 1 capsule by mouth daily at 12 noon.   Linoleic Acid Conjugated 1000 MG CAPS Take 1,000 mg by mouth in the morning and at bedtime.   losartan (COZAAR) 50 MG tablet TAKE 1 TABLET BY MOUTH EVERY DAY (Patient taking differently: Take 50 mg by mouth daily.)   Magnesium Glycinate 665 MG CAPS Take 665 mg by mouth in the morning and at bedtime.   Melatonin 5 MG TABS Take 5 mg by mouth at bedtime.   metoprolol succinate (TOPROL-XL) 50 MG 24 hr tablet TAKE 1 TABLET (50 MG TOTAL) BY MOUTH 2 (TWO) TIMES DAILY. TAKE WITH OR IMMEDIATELY FOLLOWING A MEAL. (Patient taking differently: Take 50 mg by mouth in the morning and at bedtime.)   Multiple Vitamin (MULTIVITAMIN) capsule Take 2 capsules by mouth daily.   NON FORMULARY Take 1 capsule by mouth 2 (  two) times daily. TRU vitamin   omeprazole (PRILOSEC) 20 MG capsule Take 1 capsule (20 mg total) by mouth daily.   OVER THE COUNTER MEDICATION Take 2 capsules by mouth daily. [facility]   OVER THE COUNTER MEDICATION Take 2 capsules by mouth daily. [employer]   potassium chloride SA (KLOR-CON) 20 MEQ tablet Take 2 tablets (40 mEq total) by mouth daily.   tadalafil (CIALIS) 20 MG tablet TAKE ONE TABLET BY MOUTH DAILY AS NEEDED (Patient taking differently: Take 20 mg by mouth daily as needed for erectile dysfunction.)     Allergies:   Patient has no known allergies.   Social History   Socioeconomic History   Marital status: Widowed    Spouse name: Not on file   Number of children: 3   Years of education: Not on file   Highest education level: Not on file  Occupational History   Occupation: judge, Chief Executive Officer, professor    Comment: retired from all 3  Tobacco Use   Smoking status: Never   Smokeless tobacco: Never  Vaping Use    Vaping Use: Never used  Substance and Sexual Activity   Alcohol use: Yes    Alcohol/week: 0.0 standard drinks    Comment: occasional   Drug use: No   Sexual activity: Not on file  Other Topics Concern   Not on file  Social History Narrative   Not on file   Social Determinants of Health   Financial Resource Strain: Not on file  Food Insecurity: Not on file  Transportation Needs: Not on file  Physical Activity: Not on file  Stress: Not on file  Social Connections: Not on file     Family History: The patient's family history includes Breast cancer in his mother; Hypertension in his father. There is no history of Diabetes, Stroke, CAD, Colon cancer, Colon polyps, Esophageal cancer, Rectal cancer, or Stomach cancer.  ROS:   Please see the history of present illness.    All other systems reviewed and are negative.  EKGs/Labs/Other Studies Reviewed:    The following studies were reviewed today: 2D echocardiogram 03/20/2021 (postoperative day #1 echo):  1. Left ventricular ejection fraction, by estimation, is 60 to 65%. The  left ventricle has normal function. The left ventricle has no regional  wall motion abnormalities. Left ventricular diastolic function could not  be evaluated. There is the  interventricular septum is flattened in diastole ('D' shaped left  ventricle), consistent with right ventricular volume overload.   2. Evidence of atrial level shunting detected by color flow Doppler.  There is a small secundum atrial septal defect. There is a ventricular  septal defect with left to right shunting.   3. Right ventricular systolic function is mildly reduced. The right  ventricular size is severely enlarged. There is moderately elevated  pulmonary artery systolic pressure.   4. Left atrial size was severely dilated.   5. Right atrial size was severely dilated.   6. Two Mitraclips in close proximity to each other are seen and appeat to  have stable position. There is mild  residual MR lateral to the clips. The  mitral valve has been repaired/replaced. Mild mitral valve regurgitation.  The mean mitral valve gradient  is 4.0 mmHg.   7. The tricuspid valve is myxomatous. Tricuspid valve regurgitation is  severe.   8. The aortic valve is tricuspid. Aortic valve regurgitation is not  visualized. Mild aortic valve sclerosis is present, with no evidence of  aortic valve stenosis.   EKG:  EKG is not ordered today.    Recent Labs: 11/26/2020: NT-Pro BNP 1,404; TSH 2.950 02/18/2021: B Natriuretic Peptide 381.0 03/17/2021: ALT 21 03/20/2021: BUN 22; Creatinine, Ser 1.04; Hemoglobin 12.1; Platelets 134; Potassium 4.9; Sodium 133  Recent Lipid Panel    Component Value Date/Time   TRIG 31 09/08/2018 1556     Risk Assessment/Calculations:    CHA2DS2-VASc Score = 5   This indicates a 7.2% annual risk of stroke. The patient's score is based upon: CHF History: 1 HTN History: 1 Diabetes History: 0 Stroke History: 0 Vascular Disease History: 1 Age Score: 2 Gender Score: 0          Physical Exam:    VS:  BP 118/74   Pulse 72   Ht 5\' 11"  (1.803 m)   Wt 160 lb 6.4 oz (72.8 kg)   SpO2 99%   BMI 22.37 kg/m     Wt Readings from Last 3 Encounters:  03/31/21 160 lb 6.4 oz (72.8 kg)  03/19/21 156 lb 6.4 oz (70.9 kg)  03/17/21 164 lb 14.4 oz (74.8 kg)     GEN:  Well nourished, well developed in no acute distress HEENT: Normal NECK: No JVD; No carotid bruits LYMPHATICS: No lymphadenopathy CARDIAC: RRR, 2/6 holosystolic murmur at the apex (much softer than baseline murmur) RESPIRATORY:  Clear to auscultation without rales, wheezing or rhonchi  ABDOMEN: Soft, non-tender, non-distended MUSCULOSKELETAL:  No edema; No deformity  SKIN: Warm and dry NEUROLOGIC:  Alert and oriented x 3 PSYCHIATRIC:  Normal affect   ASSESSMENT:    1. S/P mitral valve clip implantation   2. Chronic diastolic heart failure ([place])   3. Severe tricuspid regurgitation    PLAN:     In order of problems listed above:  The patient appears to be doing very well at present and his exam suggest that he has had good reduction of mitral regurgitation.  He will have a follow-up echocardiogram December 9 for his 30-day study.  He should continue his current medical program.  We discussed postprocedural surveillance after MitraClip, natural history of mitral regurgitation, and expectations moving forward. The patient appears very well compensated with New York Heart Association functional class II symptoms at present.  He does not have much functional limitation at all with his current activity level.  He is treated with furosemide 40 mg daily for his maintenance diuretic dosing. We discussed the presence of severe tricuspid regurgitation and the patient understands that this is likely related to severe RV enlargement and annular dilatation.  We reviewed potential treatment options if he becomes more symptomatic and he understands that transcatheter tricuspid valve therapies are all currently offered through clinical studies without FDA approval to date.  For now he seems to be doing quite well and we plan to continue with surveillance.    Cardiac Rehabilitation Eligibility Assessment            Medication Adjustments/Labs and Tests Ordered: Current medicines are reviewed at length with the patient today.  Concerns regarding medicines are outlined above.  No orders of the defined types were placed in this encounter.  No orders of the defined types were placed in this encounter.   Patient Instructions  Medication Instructions:  Your physician recommends that you continue on your current medications as directed. Please refer to the Current Medication list given to you today.  *If you need a refill on your cardiac medications before your next appointment, please call your pharmacy*   Lab Work: None ordered  If you have labs (blood work) drawn today and your tests are  completely normal, you will receive your results only by: [place] (if you have MyChart) OR A paper copy in the mail If you have any lab test that is abnormal or we need to change your treatment, we will call you to review the results.   Testing/Procedures: None ordered today   Follow-Up: Someone from our structural team will contact you when its time for you yearly appointment    Other Instructions None     Signed, [name], MD  03/31/2021 5:27 PM    [facility]

## 2021-04-11 ENCOUNTER — Encounter (HOSPITAL_COMMUNITY): Payer: Self-pay

## 2021-04-14 ENCOUNTER — Other Ambulatory Visit (HOSPITAL_COMMUNITY): Payer: Self-pay

## 2021-04-15 ENCOUNTER — Ambulatory Visit: Payer: Medicare PPO | Admitting: Physician Assistant

## 2021-04-15 DIAGNOSIS — E785 Hyperlipidemia, unspecified: Secondary | ICD-10-CM | POA: Diagnosis not present

## 2021-04-15 DIAGNOSIS — I10 Essential (primary) hypertension: Secondary | ICD-10-CM | POA: Diagnosis not present

## 2021-04-15 DIAGNOSIS — M1990 Unspecified osteoarthritis, unspecified site: Secondary | ICD-10-CM | POA: Diagnosis not present

## 2021-04-15 DIAGNOSIS — I509 Heart failure, unspecified: Secondary | ICD-10-CM | POA: Diagnosis not present

## 2021-04-24 ENCOUNTER — Other Ambulatory Visit: Payer: Self-pay

## 2021-04-24 ENCOUNTER — Ambulatory Visit (HOSPITAL_BASED_OUTPATIENT_CLINIC_OR_DEPARTMENT_OTHER)
Admit: 2021-04-24 | Discharge: 2021-04-24 | Disposition: A | Discharge status: Home / Self Care | Payer: Medicare PPO | Attending: Physician Assistant | Admitting: Physician Assistant

## 2021-04-24 ENCOUNTER — Other Ambulatory Visit (HOSPITAL_COMMUNITY): Payer: Self-pay

## 2021-04-24 ENCOUNTER — Ambulatory Visit (HOSPITAL_COMMUNITY)
Admission: RE | Admit: 2021-04-24 | Discharge: 2021-04-24 | Disposition: A | Discharge status: Home / Self Care | Payer: Medicare PPO | Source: Ambulatory Visit | Attending: Cardiology | Admitting: Cardiology

## 2021-04-24 ENCOUNTER — Encounter (HOSPITAL_COMMUNITY): Payer: Self-pay | Admitting: Cardiology

## 2021-04-24 VITALS — BP 112/70 | HR 70 | Wt 160.0 lb

## 2021-04-24 DIAGNOSIS — Z79899 Other long term (current) drug therapy: Secondary | ICD-10-CM | POA: Diagnosis not present

## 2021-04-24 DIAGNOSIS — I071 Rheumatic tricuspid insufficiency: Secondary | ICD-10-CM

## 2021-04-24 DIAGNOSIS — I5032 Chronic diastolic (congestive) heart failure: Secondary | ICD-10-CM

## 2021-04-24 DIAGNOSIS — I272 Pulmonary hypertension, unspecified: Secondary | ICD-10-CM | POA: Insufficient documentation

## 2021-04-24 DIAGNOSIS — Z9889 Other specified postprocedural states: Secondary | ICD-10-CM

## 2021-04-24 DIAGNOSIS — I482 Chronic atrial fibrillation, unspecified: Secondary | ICD-10-CM | POA: Diagnosis not present

## 2021-04-24 DIAGNOSIS — Z95818 Presence of other cardiac implants and grafts: Secondary | ICD-10-CM | POA: Diagnosis not present

## 2021-04-24 DIAGNOSIS — Z7901 Long term (current) use of anticoagulants: Secondary | ICD-10-CM | POA: Insufficient documentation

## 2021-04-24 DIAGNOSIS — I340 Nonrheumatic mitral (valve) insufficiency: Secondary | ICD-10-CM

## 2021-04-24 DIAGNOSIS — I081 Rheumatic disorders of both mitral and tricuspid valves: Secondary | ICD-10-CM | POA: Diagnosis not present

## 2021-04-24 DIAGNOSIS — Z7984 Long term (current) use of oral hypoglycemic drugs: Secondary | ICD-10-CM | POA: Diagnosis not present

## 2021-04-24 LAB — BASIC METABOLIC PANEL
Anion gap: 9 (ref 5–15)
BUN: 19 mg/dL (ref 8–23)
CO2: 27 mmol/L (ref 22–32)
Calcium: 9.4 mg/dL (ref 8.9–10.3)
Chloride: 99 mmol/L (ref 98–111)
Creatinine, Ser: 1.17 mg/dL (ref 0.61–1.24)
GFR, Estimated: >60 mL/min (ref >60)
Glucose, Bld: 105 mg/dL — ABNORMAL HIGH (ref 70–99)
Potassium: 4.7 mmol/L (ref 3.5–5.1)
Sodium: 135 mmol/L (ref 135–145)

## 2021-04-24 LAB — ECHOCARDIOGRAM COMPLETE
MV VTI: 1.43 cm2
S' Lateral: 3.3 cm

## 2021-04-24 LAB — BRAIN NATRIURETIC PEPTIDE: B Natriuretic Peptide: 317.6 pg/mL — ABNORMAL HIGH (ref 0.0–100.0)

## 2021-04-24 MED ORDER — SPIRONOLACTONE 25 MG PO TABS
12.5000 mg | ORAL_TABLET | Freq: Every day | ORAL | 3 refills | Status: DC
Start: 2021-04-24 — End: 2022-04-26
  Filled 2021-04-24: qty 45, 90d supply, fill #0
  Filled 2021-07-20: qty 45, 90d supply, fill #1
  Filled 2021-10-14: qty 45, 90d supply, fill #2
  Filled 2022-01-10: qty 45, 90d supply, fill #3

## 2021-04-24 NOTE — Progress Notes (Signed)
Echocardiogram 2D Echocardiogram has been performed.  Oneal Deputy Gricel Copen RDCS 04/24/2021, 10:27 AM

## 2021-04-24 NOTE — Patient Instructions (Signed)
Medication Changes:  STOP potassium  START spironolactone 12.5mg  (1/2 Tab) daily  Lab Work:  Labs done today, your results will be available in MyChart, we will contact you for abnormal readings.  Repeat labs in 10 days  Testing/Procedures:    Referrals:    Special Instructions // Education:    Follow-Up in: 3 months  At the [facility], you and your health needs are our priority. We have a designated team specialized in the treatment of Heart Failure. This Care Team includes your primary Heart Failure Specialized Cardiologist (physician), Advanced Practice Providers (APPs- Physician Assistants and Nurse Practitioners), and Pharmacist who all work together to provide you with the care you need, when you need it.   You may see any of the following providers on your designated Care Team at your next follow up:  Dr Glori Bickers Dr Haynes Kerns, NP Lyda Jester, [place] [facility] [place], [place] Audry Riles, PharmD   Please be sure to bring in all your medications bottles to every appointment.   Need to Contact us:  If you have any questions or concerns before your next appointment please send [place] a message through [place] or call our office at [phone].    TO LEAVE A MESSAGE FOR THE NURSE SELECT OPTION 2, PLEASE LEAVE A MESSAGE INCLUDING: YOUR NAME DATE OF BIRTH CALL BACK NUMBER REASON FOR CALL**this is important as we prioritize the call backs  YOU WILL RECEIVE A CALL BACK THE SAME DAY AS LONG AS YOU CALL BEFORE 4:00 PM

## 2021-04-26 NOTE — Progress Notes (Signed)
PCP: Burnard Bunting, MD Cardiology: Dr. Acie Fredrickson HF Cardiology: Dr. Aundra Dubin  87 y.o. with history of chronic atrial fibrillation, diastolic CHF, pulmonary hypertension, [name] was referred by Dr. Acie Fredrickson for evaluation of CHF and pulmonary hypertension. Patient was found to be in atrial fibrillation back in 2017.  At that time, he had a cardioversion.  This did not hold, and he has been in atrial fibrillation since 2017 chronically as far as I can tell. He has had Fe deficiency anemia requiring Fe infusions, GI workup was unrevealing.  Also of note, he has cirrhosis by 9/21 MRI abdomen, cause uncertain (not a heavy drinker).   In the summer of 2022, his legs began to swell significantly and developed weeping sores.  He developed significant dyspnea.  He was seen in the cardiology clinic and started on Lasix.  He cut back on sodium.  Echo in 7/22 showed EF 60-65%, normal RV, PASP 71 mmHg, severe biatrial enlargement, severe TR, probably moderate MR, IVC dilated. Over time, his symptoms haves improved.   TEE in 9/22 showed severe TR (functional) and severe MR (prolapse/partial P3 flail).  LHC/RHC in 9/22 showed minimal CAD, mildly elevated PCWP.     Patient had Mitraclip placed in 10/22 but the device failed the day after with device only attached to one leaflet.  Patient had redo Mitraclip in 11/22, this time it was successful with new clip adjacent to original clip.  Echo in 12/22 showed EF 60-65%, D-shaped septum with moderate RV enlargement and mildly decreased systolic function, PASP 55 mmHg, severe biatrial enlargement, 2 Mitraclips with only 1 attached to both leaflets, mild MR with mean gradient 3 mmHg, severe TR, small iatrogenic secundum ASD, dilated IVC.   Patient returns for followup of CHF and mitral regurgitation.  Weight is down 4 lbs.  He feels much better after Mitraclip placement.  He has been playing tennis twice a week and walking his dog, no dyspnea walking up a hill.   Limited at this point only by his knee pain. No orthopnea/PND, no lightheadedness, no chest pain.   Labs (7/22): pro-BNP 1404 Labs (8/22): hgb 12.8, K 4.2, creatinine 1.11 Labs (9/22): K 4.5, creatinine 1.06 Labs (11/22): K 4.9, creatinine 1.04, hgb 12.1  PMH: 1. Pulmonary hypertension: Echo in 7/22 with PASP 71 mmHg.  - V/Q scan (8/22): no evidence for chronic PE.  - PFTs (8/22): Minimal airways obstruction - RHC (9/22) showed pulmonary venous hypertension.  2. Fe deficiency anemia: He had extensive GI evaluation in the past that was unrevealing.  Followed by Dr. Jana Hakim, has had Fe infusions.  3. Chronic atrial fibrillation: Diagnosed in 2017, failed DCCV in 2017 and appears to have been in atrial fibrillation since then.  4. Cirrhosis: 9/21 abdominal MRI showed cirrhosis.  No history of heavy ETOH, cause uncertain.  5. Chronic diastolic CHF: Echo (6/50) with EF 60-65%, normal RV, PASP 71 mmHg, severe biatrial enlargement, severe TR, probably moderate MR, IVC dilated.  6. Valvular heart disease: Echo in 7/22 with probably moderate MR, severe TR.  Suspect functional due to AF/dilated atria.  - TEE (9/22): EF 55-60%, RV moderately dilated with normal systolic function, moderate LAE, severe RAE, severe TR (functional) with peak RV-RA gradient 45 mmHg, severe highly eccentric mitral regurgitation with prolapse and partial flail of the P3 segment of the posterior leaflet.   - LHC/RHC (9/22): Nonobstructive mild CAD; mean RA 6, PA 47/15 mean 31, mean PCWP 15 with v-waves to 32, CI 3.32, PVR 2.5 WU.  -  Failed Mitraclip in 10/22, successful Mitraclip in 11/22.  - Echo (12/22): EF 60-65%, D-shaped septum with moderate RV enlargement and mildly decreased systolic function, PASP 55 mmHg, severe biatrial enlargement, 2 Mitraclips with only 1 attached to both leaflets, mild MR with mean gradient 3 mmHg, severe TR, small iatrogenic secundum ASD, dilated IVC.  7. Left renal mass: S/p left nephrectomy.  8.  T-spine compression fracture.   SH: Widower, occasional ETOH (not heavy), nonsmoker.  Retired Chief Executive Officer.  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  [employer] graduate.   Family History  Problem Relation Age of Onset   Breast cancer Mother    Hypertension Father    Diabetes Neg Hx    Stroke Neg Hx    CAD Neg Hx    Colon cancer Neg Hx    Colon polyps Neg Hx    Esophageal cancer Neg Hx    Rectal cancer Neg Hx    Stomach cancer Neg Hx    ROS: All systems reviewed and negative except as per HPI.   Current Outpatient Medications  Medication Sig Dispense Refill   ALPRAZolam (XANAX) 0.25 MG tablet Take 1 tablet (0.25 mg total) by mouth every 6 (six) hours as needed for anxiety 30 tablet 0   apixaban (ELIQUIS) 5 MG TABS tablet TAKE 1 TABLET BY MOUTH TWICE DAILY 180 tablet 3   Apoaequorin (PREVAGEN EXTRA STRENGTH PO) Take 1 tablet by mouth daily.     Barberry-Oreg Grape-Goldenseal (BERBERINE COMPLEX PO) Take 2 capsules by mouth daily.     Chromium 400 MCG TABS Take 400 mcg by mouth daily.     ciclopirox (PENLAC) 8 % solution Apply 1 application topically at bedtime as needed (toe nail fungus).     Coenzyme Q10 (CO Q10) 200 MG CAPS Take 200 mg by mouth daily.     empagliflozin (JARDIANCE) 10 MG TABS tablet Take 1 tablet (10 mg total) by mouth daily before breakfast. 30 tablet 6   ferrous gluconate (FERGON) 324 MG tablet Take 324 mg by mouth daily with breakfast.     furosemide (LASIX) 40 MG tablet Take 1 tablet (40 mg total) by mouth daily. 30 tablet 3   glucosamine-chondroitin 500-400 MG tablet Take 1 tablet by mouth 2 (two) times daily.      KRILL OIL PO Take 1 capsule by mouth daily at 12 noon.     Linoleic Acid Conjugated 1000 MG CAPS Take 1,000 mg by mouth in the morning and at bedtime.     losartan (COZAAR) 50 MG tablet TAKE 1 TABLET BY MOUTH EVERY DAY 90 tablet 1   Magnesium Glycinate 665 MG CAPS Take 665 mg by mouth in the morning and at bedtime.     Melatonin 5 MG TABS Take  5 mg by mouth at bedtime.     metoprolol succinate (TOPROL-XL) 50 MG 24 hr tablet TAKE 1 TABLET (50 MG TOTAL) BY MOUTH 2 (TWO) TIMES DAILY. TAKE WITH OR IMMEDIATELY FOLLOWING A MEAL. 180 tablet 1   Multiple Vitamin (MULTIVITAMIN) capsule Take 2 capsules by mouth daily.     NON FORMULARY Take 1 capsule by mouth 2 (two) times daily. TRU vitamin     omeprazole (PRILOSEC) 20 MG capsule Take 1 capsule (20 mg total) by mouth daily. 90 capsule 3   OVER THE COUNTER MEDICATION Take 2 capsules by mouth daily. [facility]ac[facility]     OVER THE COUNTER MEDICATION Take 2 capsules by mouth daily. [employer]     spironolactone (ALDACTONE)  25 MG tablet Take 0.5 tablets (12.5 mg total) by mouth daily. 90 tablet 3   tadalafil (CIALIS) 20 MG tablet TAKE ONE TABLET BY MOUTH DAILY AS NEEDED 30 tablet 11   No current facility-administered medications for this encounter.   BP 112/70   Pulse 70   Wt 72.6 kg (160 lb)   SpO2 97%   BMI 22.32 kg/m  General: NAD Neck: JVP 7-8 cm, no thyromegaly or thyroid nodule.  Lungs: Clear to auscultation bilaterally with normal respiratory effort. CV: Nondisplaced PMI.  Heart regular S1/S2, no S3/S4, 1/6 HSM LLSB.  No peripheral edema.  No carotid bruit.  Normal pedal pulses.  Abdomen: Soft, nontender, no hepatosplenomegaly, no distention.  Skin: Intact without lesions or rashes.  Neurologic: Alert and oriented x 3.  Psych: Normal affect. Extremities: No clubbing or cyanosis.  HEENT: Normal.   Assessment/Plan: 1. Chronic diastolic CHF: Echo in 3/25 with EF 60-65%, normal RV, PASP 71 mmHg, severe biatrial enlargement, severe TR, probably moderate MR, IVC dilated.  In the summer of 2022, he developed symptomatic CHF, this has improved with diuresis and sodium restriction.  TEE showed severe functional TR and severe primary mitral regurgitation with partial flail posterior leaflet (P3).  I suspect that CHF is due to chronic atrial fibrillation + valvular disease (MR, TR).  He is now s/p  Mitraclip, last echo in 12/22 showed EF 60-65%, D-shaped septum with moderate RV enlargement and mildly decreased systolic function, PASP 55 mmHg, severe biatrial enlargement, 2 Mitraclips with only 1 attached to both leaflets, mild MR with mean gradient 3 mmHg, severe TR, small iatrogenic secundum ASD, dilated IVC. He describes minimal symptoms currently, NYHA class I-II.  Very mildly volume overloaded on exam.  - Continue Lasix 40 mg daily with BMET/BNP today.  - Continue empagliflozin 10 mg daily.    - Add spironolactone 12.5 mg daily and stop KCl.  2. Pulmonary hypertension: Pulmonary venous hypertension by 9/22 RHC.   3. Atrial fibrillation: This is chronic and likely permanent (present since 2017).  The atria are severely dilated on echo. I do not think that he would be likely to hold NSR with DCCV, even with anti-arrhythmic.  - Continue Eliquis.  - Good rate control on current Toprol XL.  4. Valvular heart disease: He is s/p Mitraclip with 12/22 echo showing mild MR and minimal MS, but he still has severe TR.  Symptomatically improved s/p Mitraclip.  - Would manage TR medically for now, may be candidate for trial of percutaneous TV repair in the future.   Followup in 3 months.   Loralie Champagne 04/26/2021

## 2021-04-27 ENCOUNTER — Other Ambulatory Visit (HOSPITAL_COMMUNITY): Payer: Self-pay

## 2021-04-27 ENCOUNTER — Other Ambulatory Visit: Payer: Self-pay | Admitting: Cardiovascular Disease

## 2021-04-28 ENCOUNTER — Other Ambulatory Visit (HOSPITAL_COMMUNITY): Payer: Self-pay

## 2021-04-28 MED ORDER — METOPROLOL SUCCINATE ER 50 MG PO TB24
50.0000 mg | ORAL_TABLET | Freq: Two times a day (BID) | ORAL | 1 refills | Status: DC
  Filled 2021-04-28: qty 180, 90d supply, fill #0
  Filled 2021-07-27: qty 180, 90d supply, fill #1

## 2021-04-29 ENCOUNTER — Other Ambulatory Visit (HOSPITAL_COMMUNITY): Payer: Self-pay

## 2021-05-03 ENCOUNTER — Encounter (HOSPITAL_COMMUNITY): Payer: Self-pay | Admitting: Cardiology

## 2021-05-04 ENCOUNTER — Other Ambulatory Visit: Payer: Self-pay

## 2021-05-04 ENCOUNTER — Ambulatory Visit (HOSPITAL_COMMUNITY)
Admission: RE | Admit: 2021-05-04 | Discharge: 2021-05-04 | Disposition: A | Discharge status: Home / Self Care | Payer: Medicare PPO | Source: Ambulatory Visit | Attending: Cardiology | Admitting: Cardiology

## 2021-05-04 DIAGNOSIS — I340 Nonrheumatic mitral (valve) insufficiency: Secondary | ICD-10-CM | POA: Insufficient documentation

## 2021-05-04 LAB — BASIC METABOLIC PANEL
Anion gap: 8 (ref 5–15)
BUN: 18 mg/dL (ref 8–23)
CO2: 29 mmol/L (ref 22–32)
Calcium: 9.3 mg/dL (ref 8.9–10.3)
Chloride: 100 mmol/L (ref 98–111)
Creatinine, Ser: 1.14 mg/dL (ref 0.61–1.24)
GFR, Estimated: >60 mL/min (ref >60)
Glucose, Bld: 112 mg/dL — ABNORMAL HIGH (ref 70–99)
Potassium: 4.2 mmol/L (ref 3.5–5.1)
Sodium: 137 mmol/L (ref 135–145)

## 2021-05-08 ENCOUNTER — Encounter (HOSPITAL_COMMUNITY): Payer: Self-pay

## 2021-05-27 ENCOUNTER — Other Ambulatory Visit (HOSPITAL_COMMUNITY): Payer: Self-pay

## 2021-05-29 ENCOUNTER — Telehealth (HOSPITAL_COMMUNITY): Payer: Self-pay | Admitting: *Deleted

## 2021-05-29 NOTE — Telephone Encounter (Signed)
Pt received letter and called to schedule.  Pt would like to know his benefits prior to scheduling Cardiac rehab.  Will forward to support staff for insurance verification and scheduling. Cherre Huger, BSN Cardiac and Training and development officer

## 2021-06-02 ENCOUNTER — Telehealth (HOSPITAL_COMMUNITY): Payer: Self-pay

## 2021-06-02 NOTE — Telephone Encounter (Signed)
Called patient to see if he was interested in participating in the Cardiac Rehab Program. Patient stated yes. Patient will come in for orientation on 07/02/21 @ 10AM and will attend the 1PM exercise class.   Tourist information centre manager.

## 2021-06-02 NOTE — Telephone Encounter (Signed)
**  UPDATED**   Pt insurance is active and benefits verified through [facility]. Co-pay $20.00, DED $0.00/$0.00 met, out of pocket $4,000.00/$0.00 met, co-insurance 0%. No pre-authorization required. Passport, 06/02/21 @ 2:04PM, XAJ#[id number]

## 2021-06-06 ENCOUNTER — Other Ambulatory Visit (HOSPITAL_COMMUNITY): Payer: Self-pay

## 2021-06-08 ENCOUNTER — Other Ambulatory Visit (HOSPITAL_COMMUNITY): Payer: Self-pay

## 2021-06-09 ENCOUNTER — Other Ambulatory Visit (HOSPITAL_COMMUNITY): Payer: Self-pay

## 2021-06-09 MED ORDER — ALPRAZOLAM 0.25 MG PO TABS
0.2500 mg | ORAL_TABLET | Freq: Four times a day (QID) | ORAL | 0 refills | Status: DC | PRN
Start: 2021-06-09 — End: 2023-03-09
  Filled 2021-06-09: qty 30, 8d supply, fill #0

## 2021-06-12 DIAGNOSIS — D1801 Hemangioma of skin and subcutaneous tissue: Secondary | ICD-10-CM | POA: Diagnosis not present

## 2021-06-12 DIAGNOSIS — D2261 Melanocytic nevi of right upper limb, including shoulder: Secondary | ICD-10-CM | POA: Diagnosis not present

## 2021-06-12 DIAGNOSIS — L570 Actinic keratosis: Secondary | ICD-10-CM | POA: Diagnosis not present

## 2021-06-12 DIAGNOSIS — L905 Scar conditions and fibrosis of skin: Secondary | ICD-10-CM | POA: Diagnosis not present

## 2021-06-12 DIAGNOSIS — D2262 Melanocytic nevi of left upper limb, including shoulder: Secondary | ICD-10-CM | POA: Diagnosis not present

## 2021-06-12 DIAGNOSIS — L814 Other melanin hyperpigmentation: Secondary | ICD-10-CM | POA: Diagnosis not present

## 2021-06-12 DIAGNOSIS — D485 Neoplasm of uncertain behavior of skin: Secondary | ICD-10-CM | POA: Diagnosis not present

## 2021-06-12 DIAGNOSIS — Z85828 Personal history of other malignant neoplasm of skin: Secondary | ICD-10-CM | POA: Diagnosis not present

## 2021-06-12 DIAGNOSIS — L821 Other seborrheic keratosis: Secondary | ICD-10-CM | POA: Diagnosis not present

## 2021-06-12 DIAGNOSIS — D0372 Melanoma in situ of left lower limb, including hip: Secondary | ICD-10-CM | POA: Diagnosis not present

## 2021-06-12 DIAGNOSIS — D225 Melanocytic nevi of trunk: Secondary | ICD-10-CM | POA: Diagnosis not present

## 2021-06-14 DIAGNOSIS — E785 Hyperlipidemia, unspecified: Secondary | ICD-10-CM | POA: Diagnosis not present

## 2021-06-14 DIAGNOSIS — I509 Heart failure, unspecified: Secondary | ICD-10-CM | POA: Diagnosis not present

## 2021-06-14 DIAGNOSIS — M1990 Unspecified osteoarthritis, unspecified site: Secondary | ICD-10-CM | POA: Diagnosis not present

## 2021-06-14 DIAGNOSIS — I10 Essential (primary) hypertension: Secondary | ICD-10-CM | POA: Diagnosis not present

## 2021-06-17 ENCOUNTER — Other Ambulatory Visit: Payer: Self-pay | Admitting: Cardiovascular Disease

## 2021-06-17 ENCOUNTER — Other Ambulatory Visit (HOSPITAL_COMMUNITY): Payer: Self-pay

## 2021-06-17 MED ORDER — LOSARTAN POTASSIUM 50 MG PO TABS
50.0000 mg | ORAL_TABLET | Freq: Every day | ORAL | 1 refills | Status: DC
  Filled 2021-06-17: qty 90, 90d supply, fill #0
  Filled 2021-11-08: qty 90, 90d supply, fill #1

## 2021-06-26 ENCOUNTER — Telehealth (HOSPITAL_COMMUNITY): Payer: Self-pay

## 2021-06-26 NOTE — Telephone Encounter (Signed)
Successful telephone encounter to patient to confirm cardiac rehab orientation appointment for 07/02/21 at 10:00 am. Health assessment completed. Confirmed patient has received directions and contact for cardiac rehab. All questions answered.

## 2021-07-01 ENCOUNTER — Other Ambulatory Visit (HOSPITAL_COMMUNITY): Payer: Self-pay

## 2021-07-01 ENCOUNTER — Other Ambulatory Visit (HOSPITAL_COMMUNITY): Payer: Self-pay | Admitting: Cardiology

## 2021-07-02 ENCOUNTER — Other Ambulatory Visit (HOSPITAL_COMMUNITY): Payer: Self-pay

## 2021-07-02 ENCOUNTER — Encounter (HOSPITAL_COMMUNITY): Payer: Self-pay

## 2021-07-02 ENCOUNTER — Encounter (HOSPITAL_COMMUNITY)
Admission: RE | Admit: 2021-07-02 | Discharge: 2021-07-02 | Disposition: A | Discharge status: Home / Self Care | Payer: Medicare PPO | Source: Ambulatory Visit | Attending: Cardiology | Admitting: Cardiology

## 2021-07-02 ENCOUNTER — Other Ambulatory Visit: Payer: Self-pay

## 2021-07-02 VITALS — BP 98/60 | HR 70 | Ht 69.25 in | Wt 164.7 lb

## 2021-07-02 DIAGNOSIS — Z9889 Other specified postprocedural states: Secondary | ICD-10-CM | POA: Insufficient documentation

## 2021-07-02 MED ORDER — FUROSEMIDE 40 MG PO TABS
40.0000 mg | ORAL_TABLET | Freq: Every day | ORAL | 3 refills | Status: DC
  Filled 2021-07-02: qty 30, 30d supply, fill #0
  Filled 2021-07-27: qty 30, 30d supply, fill #1
  Filled 2021-08-27: qty 30, 30d supply, fill #2
  Filled 2021-10-01: qty 30, 30d supply, fill #3

## 2021-07-02 NOTE — Progress Notes (Signed)
Cardiac Rehab Medication Review by a Nurse  Does the patient  feel that his/her medications are working for him/her?  yes  Has the patient been experiencing any side effects to the medications prescribed?  no  Does the patient measure his/her own blood pressure or blood glucose at home?  yes   Does the patient have any problems obtaining medications due to transportation or finances?   no  Understanding of regimen: good Understanding of indications: good Potential of compliance: excellent    Nurse comments: John Holder is taking his medications as prescribed and has a good understanding of what his medications are for.     Christa See [facility] RN 07/02/2021 10:48 AM

## 2021-07-02 NOTE — Progress Notes (Signed)
Cardiac Individual Treatment Plan  Patient Details  Name: John Holder MRN: [id number] Date of Birth: October 21, 1931 Referring Provider:   Flowsheet Row CARDIAC REHAB PHASE II ORIENTATION from 07/02/2021 in [place]  Referring Provider Loralie Champagne, MD       Initial Encounter Date:  [place] PHASE II ORIENTATION from 07/02/2021 in [place]  Date 07/02/21       Visit Diagnosis: 03/19/21 S/P mitral valve repair  Patient's Home Medications on Admission:  Current Outpatient Medications:    ALPRAZolam (XANAX) 0.25 MG tablet, Take 1 tablet (0.25 mg total) by mouth every 6 (six) hours as needed for anxiety (Patient taking differently: Take 0.25 mg by mouth every 6 (six) hours as needed for sleep.), Disp: 30 tablet, Rfl: 0   apixaban (ELIQUIS) 5 MG TABS tablet, TAKE 1 TABLET BY MOUTH TWICE DAILY, Disp: 180 tablet, Rfl: 3   Apoaequorin (PREVAGEN EXTRA STRENGTH PO), Take 1 tablet by mouth daily., Disp: , Rfl:    Barberry-Oreg Grape-Goldenseal (BERBERINE COMPLEX PO), Take 2,400 mg by mouth daily., Disp: , Rfl:    Chromium 1000 MCG TABS, Take 1,000 mcg by mouth daily. chromium picolinate, Disp: , Rfl:    Coenzyme Q10 (CO Q10) 200 MG CAPS, Take 200 mg by mouth daily., Disp: , Rfl:    empagliflozin (JARDIANCE) 10 MG TABS tablet, Take 1 tablet (10 mg total) by mouth daily before breakfast., Disp: 30 tablet, Rfl: 6   ferrous gluconate (FERGON) 324 MG tablet, Take 324 mg by mouth daily with breakfast., Disp: , Rfl:    KRILL OIL PO, Take 1 capsule by mouth daily at 12 noon., Disp: , Rfl:    losartan (COZAAR) 50 MG tablet, Take 1 tablet (50 mg total) by mouth daily., Disp: 90 tablet, Rfl: 1   MAGNESIUM GLYCINATE PO, Take 525 mg by mouth in the morning and at bedtime., Disp: , Rfl:    Melatonin 10 MG TABS, Take 10 mg by mouth at bedtime., Disp: , Rfl:    metoprolol succinate (TOPROL-XL) 50 MG 24 hr tablet, Take 1 tablet  (50 mg total) by mouth 2 (two) times daily. Take with or immediately following a meal, Disp: 180 tablet, Rfl: 1   Misc Natural Products (ADVANCED JOINT RELIEF PO), Take 1 tablet by mouth 2 (two) times daily., Disp: , Rfl:    Multiple Vitamin (MULTIVITAMIN) capsule, Take 1 capsule by mouth daily. Mega men 50 plus, Disp: , Rfl:    Multiple Vitamins-Minerals (ZINC PO), Take by mouth., Disp: , Rfl:    NON FORMULARY, Take 300 mg by mouth 2 (two) times daily. TRU vitamin  Nigen, Disp: , Rfl:    omeprazole (PRILOSEC) 20 MG capsule, Take 1 capsule (20 mg total) by mouth daily., Disp: 90 capsule, Rfl: 3   OVER THE COUNTER MEDICATION, Take 2 capsules by mouth daily. Prostacare, Disp: , Rfl:    OVER THE COUNTER MEDICATION, Take 1 capsule by mouth 2 (two) times daily. Uricare Himalaya, Disp: , Rfl:    OVER THE COUNTER MEDICATION, Take 1 tablet by mouth 2 (two) times daily. virectin, Disp: , Rfl:    OVER THE COUNTER MEDICATION, Take 1 tablet by mouth daily. Liver care, Disp: , Rfl:    OVER THE COUNTER MEDICATION, Take 1 tablet by mouth 2 (two) times daily. Tri Flex, Disp: , Rfl:    OVER THE COUNTER MEDICATION, Take 1 capsule by mouth 2 (two) times daily. conjugated linoleic acid, Disp: ,  Rfl:    OVER THE COUNTER MEDICATION, Take 1 tablet by mouth 2 (two) times daily. CDB Gummies, Disp: , Rfl:    OVER THE COUNTER MEDICATION, Take 1 capsule by mouth 2 (two) times daily. Hemp oil gummies, Disp: , Rfl:    OVER THE COUNTER MEDICATION, Take 1 tablet by mouth daily. arthrozene, Disp: , Rfl:    Polyethyl Glycol-Propyl Glycol (SYSTANE) 0.4-0.3 % SOLN, 1 drop daily as needed (as needed for dry eye)., Disp: , Rfl:    Sodium Hyaluronate, oral, (HYALURONIC ACID) 100 MG CAPS, Take 100 mg by mouth 2 (two) times daily., Disp: , Rfl:    spironolactone (ALDACTONE) 25 MG tablet, Take 0.5 tablets (12.5 mg total) by mouth daily., Disp: 90 tablet, Rfl: 3   tadalafil (CIALIS) 20 MG tablet, TAKE ONE TABLET BY MOUTH DAILY AS NEEDED,  Disp: 30 tablet, Rfl: 11   furosemide (LASIX) 40 MG tablet, Take 1 tablet (40 mg total) by mouth daily., Disp: 30 tablet, Rfl: 3  Past Medical History: Past Medical History:  Diagnosis Date   Blood transfusion without reported diagnosis    Cataract    removed in replace bilateral   CHF (congestive heart failure) (HCC)    Dysrhythmia    GERD (gastroesophageal reflux disease)    Hypertension    Iron deficiency anemia    amdx 08/2018 >> Tx with PRBCs and Feraheme started; no GI source per workup; followed by Heme (Dr. Jana Hakim) and GI (Dr. Cristina Gong)    Left renal mass    s/p L nephrectomy   Mitral regurgitation    Echo 02/2016:  Mild conc LVH, EF 55-60, mild to mod MR, mod LAE, mod to severe RAE, mod TR, PASP 41 // Echo 10/2018: EF 60-65, mod conc LVH, severe BAE, trivial eff, mild MVP with mod MR, mod to severe TR, asc aorta 36 (mild dilation)    Persistent Atrial Fibrillation    Pulmonary hypertension (HCC)    Echocardiogram 7/22: EF 60-65, no RWMA, normal RVSF, RVSP 70.7 (severe pulmonary hypertension), severe BAE, mod MR, severe TR, mild to mod AV sclerosis w/o AS   S/P mitral valve clip implantation 02/26/2021   G4 NT W device; A3/P3 with Dr. Burt Knack    Tobacco Use: Social History   Tobacco Use  Smoking Status Never  Smokeless Tobacco Never    Labs: Recent Review Flowsheet Data     Labs for ITP Cardiac and Pulmonary Rehab Latest Ref Rng & Units 09/08/2018 01/27/2021 01/27/2021 02/23/2021 03/17/2021   Trlycerides <150 mg/dL 31 - - - -   Hemoglobin A1c 4.8 - 5.6 % - - - - 5.4   PHART 7.350 - 7.450 - - - 7.389 7.421   PCO2ART 32.0 - 48.0 mmHg - - - 38.4 37.1   HCO3 20.0 - 28.0 mmol/L - 25.5 25.2 22.6 23.6   TCO2 22 - 32 mmol/L - 27 27 - -   ACIDBASEDEF 0.0 - 2.0 mmol/L - 2.0 2.0 1.6 0.3   O2SAT % - 73.0 76.0 96.8 97.9       Capillary Blood Glucose: Lab Results  Component Value Date   GLUCAP 112 (H) 02/26/2021     Exercise Target Goals: Exercise Program  Goal: Individual exercise prescription set using results from initial 6 min walk test and THRR while considering  patients activity barriers and safety.   Exercise Prescription Goal: Starting with aerobic activity 30 plus minutes a day, 3 days per week for initial exercise prescription. Provide home exercise prescription and guidelines  that participant acknowledges understanding prior to discharge.  Activity Barriers & Risk Stratification:  Activity Barriers & Cardiac Risk Stratification - 07/02/21 1343       Activity Barriers & Cardiac Risk Stratification   Activity Barriers Arthritis;Joint Problems;Muscular Weakness;Balance Concerns    Cardiac Risk Stratification High             6 Minute Walk:  6 Minute Walk     Row Name 07/02/21 1342         6 Minute Walk   Phase Initial     Distance 1483 feet     Walk Time 6 minutes     # of Rest Breaks 0     MPH 2.81     METS 2.63     RPE 11     Perceived Dyspnea  0     VO2 Peak 9.22     Symptoms No     Resting HR 81 bpm     Resting BP 118/70     Resting Oxygen Saturation  95 %     Exercise Oxygen Saturation  during 6 min walk 96 %     Max Ex. HR 109 bpm     Max Ex. BP 132/80     2 Minute Post BP 108/60              Oxygen Initial Assessment:   Oxygen Re-Evaluation:   Oxygen Discharge (Final Oxygen Re-Evaluation):   Initial Exercise Prescription:  Initial Exercise Prescription - 07/02/21 1300       Date of Initial Exercise RX and Referring Provider   Date 07/02/21    Referring Provider Loralie Champagne, MD    Expected Discharge Date 08/28/21      NuStep   Level 2    SPM 75    Minutes 15    METs 2.4      Arm Ergometer   Level 1.5    RPM 60    Minutes 15    METs 2.5      Prescription Details   Frequency (times per week) 3    Duration Progress to 30 minutes of continuous aerobic without signs/symptoms of physical distress      Intensity   THRR 40-80% of Max Heartrate 53-106    Ratings of  Perceived Exertion 11-13    Perceived Dyspnea 0-4      Progression   Progression Continue progressive overload as per policy without signs/symptoms or physical distress.      Resistance Training   Training Prescription Yes    Weight 3 lbs    Reps 10-15             Perform Capillary Blood Glucose checks as needed.  Exercise Prescription Changes:   Exercise Comments:   Exercise Goals and Review:   Exercise Goals     Row Name 07/02/21 1344             Exercise Goals   Increase Physical Activity Yes       Intervention Provide advice, education, support and counseling about physical activity/exercise needs.;Develop an individualized exercise prescription for aerobic and resistive training based on initial evaluation findings, risk stratification, comorbidities and participant's personal goals.       Expected Outcomes Short Term: Attend rehab on a regular basis to increase amount of physical activity.;Long Term: Add in home exercise to make exercise part of routine and to increase amount of physical activity.;Long Term: Exercising regularly at least 3-5 days a week.  Increase Strength and Stamina Yes       Intervention Provide advice, education, support and counseling about physical activity/exercise needs.;Develop an individualized exercise prescription for aerobic and resistive training based on initial evaluation findings, risk stratification, comorbidities and participant's personal goals.       Expected Outcomes Short Term: Increase workloads from initial exercise prescription for resistance, speed, and METs.;Short Term: Perform resistance training exercises routinely during rehab and add in resistance training at home;Long Term: Improve cardiorespiratory fitness, muscular endurance and strength as measured by increased METs and functional capacity (6MWT)       Able to understand and use rate of perceived exertion (RPE) scale Yes       Intervention Provide education and  explanation on how to use RPE scale       Expected Outcomes Short Term: Able to use RPE daily in rehab to express subjective intensity level;Long Term:  Able to use RPE to guide intensity level when exercising independently       Knowledge and understanding of Target Heart Rate Range (THRR) Yes       Intervention Provide education and explanation of THRR including how the numbers were predicted and where they are located for reference       Expected Outcomes Short Term: Able to state/look up THRR;Short Term: Able to use daily as guideline for intensity in rehab;Long Term: Able to use THRR to govern intensity when exercising independently       Understanding of Exercise Prescription Yes       Intervention Provide education, explanation, and written materials on patient's individual exercise prescription       Expected Outcomes Short Term: Able to explain program exercise prescription;Long Term: Able to explain home exercise prescription to exercise independently                Exercise Goals Re-Evaluation :    Discharge Exercise Prescription (Final Exercise Prescription Changes):   Nutrition:  Target Goals: Understanding of nutrition guidelines, daily intake of sodium 1500mg , cholesterol 200mg , calories 30% from fat and 7% or less from saturated fats, daily to have 5 or more servings of fruits and vegetables.  Biometrics:  Pre Biometrics - 07/02/21 1000       Pre Biometrics   Waist Circumference 37.5 inches    Hip Circumference 40 inches    Waist to Hip Ratio 0.94 %    Triceps Skinfold 5 mm    % Body Fat 21.3 %    Grip Strength 34 kg    Flexibility 0 in   could not reach   Single Leg Stand 2.23 seconds              Nutrition Therapy Plan and Nutrition Goals:   Nutrition Assessments:  MEDIFICTS Score Key: ?70 Need to make dietary changes  40-70 Heart Healthy Diet ? 40 Therapeutic Level Cholesterol Diet   Picture Your Plate Scores: <81 Unhealthy dietary  pattern with much room for improvement. 41-50 Dietary pattern unlikely to meet recommendations for good health and room for improvement. 51-60 More healthful dietary pattern, with some room for improvement.  >60 Healthy dietary pattern, although there may be some specific behaviors that could be improved.    Nutrition Goals Re-Evaluation:   Nutrition Goals Discharge (Final Nutrition Goals Re-Evaluation):   Psychosocial: Target Goals: Acknowledge presence or absence of significant depression and/or stress, maximize coping skills, provide positive support system. Participant is able to verbalize types and ability to use techniques and skills needed for reducing stress  and depression.  Initial Review & Psychosocial Screening:  Initial Psych Review & Screening - 07/02/21 1042       Initial Review   Current issues with None Identified      Family Dynamics   Good Support System? Yes   [name] has his partner and children  for support   Comments [name] lost his wife in 2018      Barriers   Psychosocial barriers to participate in program There are no identifiable barriers or psychosocial needs.      Screening Interventions   Interventions Encouraged to exercise             Quality of Life Scores:  Quality of Life - 07/02/21 1339       Quality of Life   Select Quality of Life      Quality of Life Scores   Health/Function Pre 28.1 %    Socioeconomic Pre 26.67 %    Psych/Spiritual Pre 25.07 %    Family Pre 24 %    GLOBAL Pre 26.58 %            Scores of 19 and below usually indicate a poorer quality of life in these areas.  A difference of  2-3 points is a clinically meaningful difference.  A difference of 2-3 points in the total score of the Quality of Life Index has been associated with significant improvement in overall quality of life, self-image, physical symptoms, and general health in studies assessing change in quality of life.  PHQ-9: Recent Review Flowsheet Data      Depression screen [facility] 2/9 07/02/2021 09/30/2014   Decreased Interest 0 0   Down, Depressed, Hopeless 0 0   PHQ - 2 Score 0 0      Interpretation of Total Score  Total Score Depression Severity:  1-4 = Minimal depression, 5-9 = Mild depression, 10-14 = Moderate depression, 15-19 = Moderately severe depression, 20-27 = Severe depression   Psychosocial Evaluation and Intervention:   Psychosocial Re-Evaluation:   Psychosocial Discharge (Final Psychosocial Re-Evaluation):   Vocational Rehabilitation: Provide vocational rehab assistance to qualifying candidates.   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 Participant will state understanding/return demonstration of topics presented.  Learning Barriers/Preferences:  Learning Barriers/Preferences - 07/02/21 1341       Learning Barriers/Preferences   Learning Barriers Sight;Hearing   Wears glasses and bilateral hearing aids   Learning Preferences Audio;Written Material;Computer/Internet;Group Instruction;Individual Instruction;Pictoral;Skilled Demonstration;Verbal Instruction             Education Topics: Hypertension, Hypertension Reduction -Define heart disease and high blood pressure. Discus how high blood pressure affects the body and ways to reduce high blood pressure.   Exercise and Your Heart -Discuss why it is important to exercise, the FITT principles of exercise, normal and abnormal responses to exercise, and how to exercise safely.   Angina -Discuss definition of angina, causes of angina, treatment of angina, and how to decrease risk of having angina.   Cardiac  Medications -Review what the following cardiac medications are used for, how they affect the body, and side effects that may occur  when taking the medications.  Medications include Aspirin, Beta blockers, calcium channel blockers, ACE Inhibitors, angiotensin receptor blockers, diuretics, digoxin, and antihyperlipidemics.   Congestive Heart Failure -Discuss the definition of CHF, how to live with CHF, the signs and symptoms of CHF, and how keep track of weight and sodium intake.   Heart Disease and Intimacy -Discus the effect sexual activity has on the heart, how changes occur during intimacy as we age, and safety during sexual activity.   Smoking Cessation / COPD -Discuss different methods to quit smoking, the health benefits of quitting smoking, and the definition of COPD.   Nutrition I: Fats -Discuss the types of cholesterol, what cholesterol does to the heart, and how cholesterol levels can be controlled.   Nutrition II: Labels -Discuss the different components of food labels and how to read food label   Heart Parts/Heart Disease and PAD -Discuss the anatomy of the heart, the pathway of blood circulation through the heart, and these are affected by heart disease.   Stress I: Signs and Symptoms -Discuss the causes of stress, how stress may lead to anxiety and depression, and ways to limit stress.   Stress II: Relaxation -Discuss different types of relaxation techniques to limit stress.   Warning Signs of Stroke / TIA -Discuss definition of a stroke, what the signs and symptoms are of a stroke, and how to identify when someone is having stroke.   Knowledge Questionnaire Score:  Knowledge Questionnaire Score - 07/02/21 1340       Knowledge Questionnaire Score   Pre Score 22/24             Core Components/Risk Factors/Patient Goals at Admission:  Personal Goals and Risk Factors at Admission - 07/02/21 1340       Core Components/Risk Factors/Patient Goals on  Admission    Weight Management Weight Maintenance    Hypertension Yes    Intervention Provide education on lifestyle modifcations including regular physical activity/exercise, weight management, moderate sodium restriction and increased consumption of fresh fruit, vegetables, and low fat dairy, alcohol moderation, and smoking cessation.;Monitor prescription use compliance.    Expected Outcomes Short Term: Continued assessment and intervention until BP is < 140/63mm HG in hypertensive participants. < 130/73mm HG in hypertensive participants with diabetes, heart failure or chronic kidney disease.;Long Term: Maintenance of blood pressure at goal levels.             Core Components/Risk Factors/Patient Goals Review:    Core Components/Risk Factors/Patient Goals at Discharge (Final Review):    ITP Comments:  ITP Comments     Row Name 07/02/21 1041           ITP Comments Dr Fransico Him MD, Medical Director                Comments: [name] attended orientation on 07/02/2021 to review rules and guidelines for program.  Completed 6 minute walk test, Intitial ITP, and exercise prescription.  VSS. Telemetry-Atrial fib with intermittent PVC's.  Asymptomatic. Safety measures and social distancing in place per CDC guidelines. Harrell Gave RN BSN

## 2021-07-06 ENCOUNTER — Other Ambulatory Visit: Payer: Self-pay

## 2021-07-06 ENCOUNTER — Telehealth (HOSPITAL_COMMUNITY): Payer: Self-pay

## 2021-07-06 ENCOUNTER — Encounter (HOSPITAL_COMMUNITY): Payer: Medicare PPO

## 2021-07-06 ENCOUNTER — Encounter (HOSPITAL_COMMUNITY)
Admission: RE | Admit: 2021-07-06 | Discharge: 2021-07-06 | Disposition: A | Discharge status: Home / Self Care | Payer: Medicare PPO | Source: Ambulatory Visit | Attending: Cardiovascular Disease | Admitting: Cardiovascular Disease

## 2021-07-06 DIAGNOSIS — Z9889 Other specified postprocedural states: Secondary | ICD-10-CM

## 2021-07-06 NOTE — Progress Notes (Signed)
Daily Session Note  Patient Details  Name: John Holder MRN: [id number] Date of Birth: 05/17/1932 Referring Provider:   Flowsheet Row CARDIAC REHAB PHASE II ORIENTATION from 07/02/2021 in [place]  Referring Provider John Champagne, MD       Encounter Date: 07/06/2021  Check In:  Session Check In - 07/06/21 1254       Check-In   Supervising physician immediately available to respond to emergencies [facility] immediately available    Physician(s) Dr. Doristine Bosworth    Location [facility]    Staff Present John Rubenstein, MS, ACSM-CEP, CCRP, Exercise Physiologist;John Carneal Venetia Maxon, RN, John Pretty, MS, ACSM CEP, Exercise Physiologist    Virtual Visit No    Medication changes reported     No    Fall or balance concerns reported    No    Tobacco Cessation No Change    Current number of cigarettes/nicotine per day     0    Warm-up and Cool-down Performed as group-led instruction    Resistance Training Performed Yes    VAD Patient? No    PAD/SET Patient? No      Pain Assessment   Currently in Pain? No/denies    Pain Score 0-No pain    Multiple Pain Sites No             Capillary Blood Glucose: No results found for this or any previous visit (from the past 24 hour(s)).   Exercise Prescription Changes - 07/06/21 1400       Response to Exercise   Blood Pressure (Admit) 114/70    Blood Pressure (Exercise) 140/80    Blood Pressure (Exit) 110/70    Heart Rate (Admit) 62 bpm    Heart Rate (Exercise) 106 bpm    Heart Rate (Exit) 69 bpm    Rating of Perceived Exertion (Exercise) 14    Symptoms None    Comments Pt's first day in the [facility] program    Duration Continue with 30 min of aerobic exercise without signs/symptoms of physical distress.    Intensity THRR unchanged      Progression   Progression Continue to progress workloads to maintain intensity without signs/symptoms of physical distress.    Average METs 2.3       Resistance Training   Training Prescription Yes    Weight 3 lbs    Reps 10-15    Time 10 Minutes      Interval Training   Interval Training No      NuStep   Level 2    SPM 75    Minutes 15    METs 2.4      Arm Ergometer   Level 1    RPM 50    Minutes 10    METs 2.2             Social History   Tobacco Use  Smoking Status Never  Smokeless Tobacco Never    Goals Met:  Exercise tolerated well No report of concerns or symptoms today Strength training completed today  Goals Unmet:  Not Applicable  Comments: John Holder started cardiac rehab today.  Pt tolerated light exercise without difficulty. VSS, telemetry-chronic atrial fib, occasional PVC.  asymptomatic.  Medication list reconciled. Pt denies barriers to medicaiton compliance.  PSYCHOSOCIAL ASSESSMENT:  PHQ-0. Pt exhibits positive coping skills, hopeful outlook with supportive family. No psychosocial needs identified at this time, no psychosocial interventions necessary.    Pt enjoys riding a motorcycle and  playing tennis.   Pt oriented to exercise equipment and routine.    Understanding verbalized. John Pall, RN,BSN 07/06/2021 4:30 PM    John Holder is Medical Director for Cardiac Rehab at [facility].

## 2021-07-08 ENCOUNTER — Encounter (HOSPITAL_COMMUNITY)
Admission: RE | Admit: 2021-07-08 | Discharge: 2021-07-08 | Disposition: A | Discharge status: Home / Self Care | Payer: Medicare PPO | Source: Ambulatory Visit | Attending: Cardiovascular Disease | Admitting: Cardiovascular Disease

## 2021-07-08 ENCOUNTER — Other Ambulatory Visit (HOSPITAL_COMMUNITY): Payer: Self-pay

## 2021-07-08 ENCOUNTER — Encounter (HOSPITAL_COMMUNITY): Payer: Medicare PPO

## 2021-07-08 DIAGNOSIS — Z9889 Other specified postprocedural states: Secondary | ICD-10-CM | POA: Diagnosis not present

## 2021-07-08 DIAGNOSIS — D0372 Melanoma in situ of left lower limb, including hip: Secondary | ICD-10-CM | POA: Diagnosis not present

## 2021-07-08 DIAGNOSIS — Z85828 Personal history of other malignant neoplasm of skin: Secondary | ICD-10-CM | POA: Diagnosis not present

## 2021-07-08 DIAGNOSIS — L988 Other specified disorders of the skin and subcutaneous tissue: Secondary | ICD-10-CM | POA: Diagnosis not present

## 2021-07-08 MED ORDER — MUPIROCIN 2 % EX OINT
1.0000 "application " | TOPICAL_OINTMENT | Freq: Two times a day (BID) | CUTANEOUS | 0 refills | Status: DC
  Filled 2021-07-08: qty 44, 22d supply, fill #0

## 2021-07-10 ENCOUNTER — Encounter (HOSPITAL_COMMUNITY)
Admission: RE | Admit: 2021-07-10 | Discharge: 2021-07-10 | Disposition: A | Discharge status: Home / Self Care | Payer: Medicare PPO | Source: Ambulatory Visit | Attending: Cardiovascular Disease | Admitting: Cardiovascular Disease

## 2021-07-10 ENCOUNTER — Other Ambulatory Visit: Payer: Self-pay

## 2021-07-10 DIAGNOSIS — Z9889 Other specified postprocedural states: Secondary | ICD-10-CM | POA: Diagnosis not present

## 2021-07-13 ENCOUNTER — Encounter (HOSPITAL_COMMUNITY)
Admission: RE | Admit: 2021-07-13 | Discharge: 2021-07-13 | Disposition: A | Discharge status: Home / Self Care | Payer: Medicare PPO | Source: Ambulatory Visit | Attending: Cardiovascular Disease | Admitting: Cardiovascular Disease

## 2021-07-13 ENCOUNTER — Other Ambulatory Visit: Payer: Self-pay

## 2021-07-13 DIAGNOSIS — Z9889 Other specified postprocedural states: Secondary | ICD-10-CM | POA: Diagnosis not present

## 2021-07-13 NOTE — Progress Notes (Signed)
Initial BP 85/52 via auto cuff. Patient asymptomatic. Recheck BP 98/78 manual BP 121/72 auto cuff sitting. Standing BP 118/70 manual. Standing auto cuff 111/73. Darrick Grinder NP paged about low BP. No new orders received. Okay to proceed with exercise. Will continue to monitor the patient throughout  the program.Emagene Merfeld Venetia Maxon, RN,BSN 07/13/2021 1:18 PM

## 2021-07-15 ENCOUNTER — Encounter (HOSPITAL_COMMUNITY)
Admission: RE | Admit: 2021-07-15 | Discharge: 2021-07-15 | Disposition: A | Discharge status: Home / Self Care | Payer: Medicare PPO | Source: Ambulatory Visit | Attending: Cardiovascular Disease | Admitting: Cardiovascular Disease

## 2021-07-15 ENCOUNTER — Other Ambulatory Visit (HOSPITAL_COMMUNITY): Payer: Self-pay

## 2021-07-15 ENCOUNTER — Other Ambulatory Visit: Payer: Self-pay

## 2021-07-15 DIAGNOSIS — Z9889 Other specified postprocedural states: Secondary | ICD-10-CM | POA: Diagnosis not present

## 2021-07-17 ENCOUNTER — Other Ambulatory Visit: Payer: Self-pay

## 2021-07-17 ENCOUNTER — Encounter (HOSPITAL_COMMUNITY)
Admission: RE | Admit: 2021-07-17 | Discharge: 2021-07-17 | Disposition: A | Discharge status: Home / Self Care | Payer: Medicare PPO | Source: Ambulatory Visit | Attending: Cardiovascular Disease | Admitting: Cardiovascular Disease

## 2021-07-17 DIAGNOSIS — Z9889 Other specified postprocedural states: Secondary | ICD-10-CM

## 2021-07-20 ENCOUNTER — Other Ambulatory Visit: Payer: Self-pay

## 2021-07-20 ENCOUNTER — Encounter (HOSPITAL_COMMUNITY)
Admission: RE | Admit: 2021-07-20 | Discharge: 2021-07-20 | Disposition: A | Discharge status: Home / Self Care | Payer: Medicare PPO | Source: Ambulatory Visit | Attending: Cardiovascular Disease | Admitting: Cardiovascular Disease

## 2021-07-20 ENCOUNTER — Other Ambulatory Visit (HOSPITAL_COMMUNITY): Payer: Self-pay

## 2021-07-20 DIAGNOSIS — Z9889 Other specified postprocedural states: Secondary | ICD-10-CM

## 2021-07-21 NOTE — Progress Notes (Signed)
Reviewed home exercise Rx with patient today. Pt encouraged to warm-up, cool-down and stretch with exercise. Reviewed THRR of 53-106 and keeping RPE between 11-13. Encouraged hydration with activity. Discussed weather parameters for temperature and humidity for safe exercise outdoors. Reviewed S/S that would require patient to terminate exercise and when to call MD vs 911. Pt verbalized understanding of the home exercise Rx and was provided a copy.  ? ?Lesly Rubenstein MS, ACSM-CEP, CCRP  ? ? ?

## 2021-07-22 ENCOUNTER — Other Ambulatory Visit: Payer: Self-pay

## 2021-07-22 ENCOUNTER — Encounter (HOSPITAL_COMMUNITY)
Admission: RE | Admit: 2021-07-22 | Discharge: 2021-07-22 | Disposition: A | Discharge status: Home / Self Care | Payer: Medicare PPO | Source: Ambulatory Visit | Attending: Cardiovascular Disease | Admitting: Cardiovascular Disease

## 2021-07-22 DIAGNOSIS — Z9889 Other specified postprocedural states: Secondary | ICD-10-CM | POA: Diagnosis not present

## 2021-07-24 ENCOUNTER — Other Ambulatory Visit: Payer: Self-pay

## 2021-07-24 ENCOUNTER — Encounter (HOSPITAL_COMMUNITY): Payer: Self-pay | Admitting: Cardiology

## 2021-07-24 ENCOUNTER — Ambulatory Visit (HOSPITAL_COMMUNITY)
Admission: RE | Admit: 2021-07-24 | Discharge: 2021-07-24 | Disposition: A | Discharge status: Home / Self Care | Payer: Medicare PPO | Source: Ambulatory Visit | Attending: Cardiology | Admitting: Cardiology

## 2021-07-24 ENCOUNTER — Encounter (HOSPITAL_COMMUNITY)
Admission: RE | Admit: 2021-07-24 | Discharge: 2021-07-24 | Disposition: A | Discharge status: Home / Self Care | Payer: Medicare PPO | Source: Ambulatory Visit | Attending: Cardiovascular Disease | Admitting: Cardiovascular Disease

## 2021-07-24 VITALS — BP 100/60 | HR 78 | Wt 160.4 lb

## 2021-07-24 DIAGNOSIS — Z7984 Long term (current) use of oral hypoglycemic drugs: Secondary | ICD-10-CM | POA: Diagnosis not present

## 2021-07-24 DIAGNOSIS — I5032 Chronic diastolic (congestive) heart failure: Secondary | ICD-10-CM

## 2021-07-24 DIAGNOSIS — I081 Rheumatic disorders of both mitral and tricuspid valves: Secondary | ICD-10-CM | POA: Diagnosis not present

## 2021-07-24 DIAGNOSIS — Z79899 Other long term (current) drug therapy: Secondary | ICD-10-CM | POA: Insufficient documentation

## 2021-07-24 DIAGNOSIS — I2510 Atherosclerotic heart disease of native coronary artery without angina pectoris: Secondary | ICD-10-CM | POA: Insufficient documentation

## 2021-07-24 DIAGNOSIS — Z9889 Other specified postprocedural states: Secondary | ICD-10-CM

## 2021-07-24 DIAGNOSIS — Z95818 Presence of other cardiac implants and grafts: Secondary | ICD-10-CM

## 2021-07-24 DIAGNOSIS — I482 Chronic atrial fibrillation, unspecified: Secondary | ICD-10-CM | POA: Diagnosis not present

## 2021-07-24 DIAGNOSIS — K7460 Unspecified cirrhosis of liver: Secondary | ICD-10-CM | POA: Diagnosis not present

## 2021-07-24 DIAGNOSIS — I071 Rheumatic tricuspid insufficiency: Secondary | ICD-10-CM

## 2021-07-24 DIAGNOSIS — I272 Pulmonary hypertension, unspecified: Secondary | ICD-10-CM

## 2021-07-24 DIAGNOSIS — Z7901 Long term (current) use of anticoagulants: Secondary | ICD-10-CM | POA: Insufficient documentation

## 2021-07-24 LAB — BASIC METABOLIC PANEL
Anion gap: 9 (ref 5–15)
BUN: 22 mg/dL (ref 8–23)
CO2: 27 mmol/L (ref 22–32)
Calcium: 9.4 mg/dL (ref 8.9–10.3)
Chloride: 100 mmol/L (ref 98–111)
Creatinine, Ser: 1.12 mg/dL (ref 0.61–1.24)
GFR, Estimated: >60 mL/min (ref >60)
Glucose, Bld: 103 mg/dL — ABNORMAL HIGH (ref 70–99)
Potassium: 4.1 mmol/L (ref 3.5–5.1)
Sodium: 136 mmol/L (ref 135–145)

## 2021-07-24 LAB — BRAIN NATRIURETIC PEPTIDE: B Natriuretic Peptide: 292.4 pg/mL — ABNORMAL HIGH (ref 0.0–100.0)

## 2021-07-24 NOTE — Patient Instructions (Signed)
Thank you for your visit. ? ?Labs done today, your results will be available in MyChart, we will contact you for abnormal readings. ? ?Your physician has requested that you have an echocardiogram. Echocardiography is a painless test that uses sound waves to create images of your heart. It provides your doctor with information about the size and shape of your heart and how well your heart?s chambers and valves are working. This procedure takes approximately one hour. There are no restrictions for this procedure. ? ?Your physician recommends that you schedule a follow-up appointment in: 3 months. ? ?If you have any questions or concerns before your next appointment please send [place] a message through [place] or call our office at [phone].   ? ?TO LEAVE A MESSAGE FOR THE NURSE SELECT OPTION 2, PLEASE LEAVE A MESSAGE INCLUDING: ?YOUR NAME ?DATE OF BIRTH ?CALL BACK NUMBER ?REASON FOR CALL**this is important as we prioritize the call backs ? ?YOU WILL RECEIVE A CALL BACK THE SAME DAY AS LONG AS YOU CALL BEFORE 4:00 PM ? ?At the [facility], you and your health needs are our priority. As part of our continuing mission to provide you with exceptional heart care, we have created designated Provider Care Teams. These Care Teams include your primary Cardiologist (physician) and Advanced Practice Providers (APPs- Physician Assistants and Nurse Practitioners) who all work together to provide you with the care you need, when you need it.  ? ?You may see any of the following providers on your designated Care Team at your next follow up: ?Dr Glori Bickers ?Dr Loralie Champagne ?Darrick Grinder, NP ?Lyda Jester, PA ?Jessica Milford,NP ?Marlyce Huge, PA ?Audry Riles, PharmD ? ? ?Please be sure to bring in all your medications bottles to every appointment.  ? ? ?

## 2021-07-25 NOTE — Progress Notes (Signed)
PCP: Burnard Bunting, MD Cardiology: Dr. Acie Fredrickson HF Cardiology: Dr. Aundra Dubin  88 y.o. with history of chronic atrial fibrillation, diastolic CHF, pulmonary hypertension, [name] was referred by Dr. Acie Fredrickson for evaluation of CHF and pulmonary hypertension. Patient was found to be in atrial fibrillation back in 2017.  At that time, he had a cardioversion.  This did not hold, and he has been in atrial fibrillation since 2017 chronically as far as I can tell. He has had Fe deficiency anemia requiring Fe infusions, GI workup was unrevealing.  Also of note, he has cirrhosis by 9/21 MRI abdomen, cause uncertain (not a heavy drinker).   In the summer of 2022, his legs began to swell significantly and developed weeping sores.  He developed significant dyspnea.  He was seen in the cardiology clinic and started on Lasix.  He cut back on sodium.  Echo in 7/22 showed EF 60-65%, normal RV, PASP 71 mmHg, severe biatrial enlargement, severe TR, probably moderate MR, IVC dilated. Over time, his symptoms haves improved.   TEE in 9/22 showed severe TR (functional) and severe MR (prolapse/partial P3 flail).  LHC/RHC in 9/22 showed minimal CAD, mildly elevated PCWP.     Patient had Mitraclip placed in 10/22 but the device failed the day after with device only attached to one leaflet.  Patient had redo Mitraclip in 11/22, this time it was successful with new clip adjacent to original clip.  Echo in 12/22 showed EF 60-65%, D-shaped septum with moderate RV enlargement and mildly decreased systolic function, PASP 55 mmHg, severe biatrial enlargement, 2 Mitraclips with only 1 attached to both leaflets, mild MR with mean gradient 3 mmHg, severe TR, small iatrogenic secundum ASD, dilated IVC.   Patient returns for followup of CHF and mitral regurgitation.  Weight is stable.  He is doing cardiac rehab.  Main complaint is knee pain.  No significant exertional dyspnea. Has gotten back to playing tennis.  No dyspnea walking  up a hill or stairs. No chest pain.  No orthopnea/PND.   Labs (7/22): pro-BNP 1404 Labs (8/22): hgb 12.8, K 4.2, creatinine 1.11 Labs (9/22): K 4.5, creatinine 1.06 Labs (11/22): K 4.9, creatinine 1.04, hgb 12.1 Labs (12/22): K 4.2, creatinine 1.14, BNP 318  PMH: 1. Pulmonary hypertension: Echo in 7/22 with PASP 71 mmHg.  - V/Q scan (8/22): no evidence for chronic PE.  - PFTs (8/22): Minimal airways obstruction - RHC (9/22) showed pulmonary venous hypertension.  2. Fe deficiency anemia: He had extensive GI evaluation in the past that was unrevealing.  Followed by Dr. Jana Hakim, has had Fe infusions.  3. Chronic atrial fibrillation: Diagnosed in 2017, failed DCCV in 2017 and appears to have been in atrial fibrillation since then.  4. Cirrhosis: 9/21 abdominal MRI showed cirrhosis.  No history of heavy ETOH, cause uncertain.  5. Chronic diastolic CHF: Echo (8/54) with EF 60-65%, normal RV, PASP 71 mmHg, severe biatrial enlargement, severe TR, probably moderate MR, IVC dilated.  6. Valvular heart disease: Echo in 7/22 with probably moderate MR, severe TR.  Suspect functional due to AF/dilated atria.  - TEE (9/22): EF 55-60%, RV moderately dilated with normal systolic function, moderate LAE, severe RAE, severe TR (functional) with peak RV-RA gradient 45 mmHg, severe highly eccentric mitral regurgitation with prolapse and partial flail of the P3 segment of the posterior leaflet.   - LHC/RHC (9/22): Nonobstructive mild CAD; mean RA 6, PA 47/15 mean 31, mean PCWP 15 with v-waves to 32, CI 3.32, PVR 2.5 WU.  -  Failed Mitraclip in 10/22, successful Mitraclip in 11/22.  - Echo (12/22): EF 60-65%, D-shaped septum with moderate RV enlargement and mildly decreased systolic function, PASP 55 mmHg, severe biatrial enlargement, 2 Mitraclips with only 1 attached to both leaflets, mild MR with mean gradient 3 mmHg, severe TR, small iatrogenic secundum ASD, dilated IVC.  7. Left renal mass: S/p left nephrectomy.   8. T-spine compression fracture.  9. Melanoma  SH: Widower, occasional ETOH (not heavy), nonsmoker.  Retired Chief Executive Officer.  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  [employer] graduate.   Family History  Problem Relation Age of Onset   Breast cancer Mother    Hypertension Father    Diabetes Neg Hx    Stroke Neg Hx    CAD Neg Hx    Colon cancer Neg Hx    Colon polyps Neg Hx    Esophageal cancer Neg Hx    Rectal cancer Neg Hx    Stomach cancer Neg Hx    ROS: All systems reviewed and negative except as per HPI.   Current Outpatient Medications  Medication Sig Dispense Refill   ALPRAZolam (XANAX) 0.25 MG tablet Take 1 tablet (0.25 mg total) by mouth every 6 (six) hours as needed for anxiety 30 tablet 0   apixaban (ELIQUIS) 5 MG TABS tablet TAKE 1 TABLET BY MOUTH TWICE DAILY 180 tablet 3   Apoaequorin (PREVAGEN EXTRA STRENGTH PO) Take 1 tablet by mouth daily.     Barberry-Oreg Grape-Goldenseal (BERBERINE COMPLEX PO) Take 2,400 mg by mouth daily.     Chromium 1000 MCG TABS Take 1,000 mcg by mouth daily. chromium picolinate     Coenzyme Q10 (CO Q10) 200 MG CAPS Take 200 mg by mouth daily.     empagliflozin (JARDIANCE) 10 MG TABS tablet Take 1 tablet (10 mg total) by mouth daily before breakfast. 30 tablet 6   ferrous gluconate (FERGON) 324 MG tablet Take 324 mg by mouth daily with breakfast.     furosemide (LASIX) 40 MG tablet Take 1 tablet (40 mg total) by mouth daily. 30 tablet 3   KRILL OIL PO Take 1 capsule by mouth daily at 12 noon.     losartan (COZAAR) 50 MG tablet Take 1 tablet (50 mg total) by mouth daily. 90 tablet 1   MAGNESIUM GLYCINATE PO Take 525 mg by mouth in the morning and at bedtime.     Melatonin 10 MG TABS Take 10 mg by mouth at bedtime.     metoprolol succinate (TOPROL-XL) 50 MG 24 hr tablet Take 1 tablet (50 mg total) by mouth 2 (two) times daily. Take with or immediately following a meal 180 tablet 1   Misc Natural Products (ADVANCED JOINT RELIEF PO) Take 1  tablet by mouth 2 (two) times daily.     Multiple Vitamin (MULTIVITAMIN) capsule Take 1 capsule by mouth daily. Mega men 50 plus     Multiple Vitamins-Minerals (ZINC PO) Take by mouth.     NON FORMULARY Take 300 mg by mouth 2 (two) times daily. TRU vitamin  Nigen     omeprazole (PRILOSEC) 20 MG capsule Take 1 capsule (20 mg total) by mouth daily. 90 capsule 3   OVER THE COUNTER MEDICATION Take 2 capsules by mouth daily. Prostacare     OVER THE COUNTER MEDICATION Take 1 capsule by mouth 2 (two) times daily. Uricare Himalaya     OVER THE COUNTER MEDICATION Take 1 tablet by mouth 2 (two) times daily. virectin  OVER THE COUNTER MEDICATION Take 1 tablet by mouth daily. Liver care     OVER THE COUNTER MEDICATION Take 1 tablet by mouth 2 (two) times daily. Tri Flex     OVER THE COUNTER MEDICATION Take 1 capsule by mouth 2 (two) times daily. conjugated linoleic acid     OVER THE COUNTER MEDICATION Take 1 tablet by mouth 2 (two) times daily. CDB Gummies     OVER THE COUNTER MEDICATION Take 1 capsule by mouth 2 (two) times daily. Hemp oil gummies     OVER THE COUNTER MEDICATION Take 1 tablet by mouth daily. arthrozene     Polyethyl Glycol-Propyl Glycol (SYSTANE) 0.4-0.3 % SOLN 1 drop daily as needed (as needed for dry eye).     Sodium Hyaluronate, oral, (HYALURONIC ACID) 100 MG CAPS Take 100 mg by mouth 2 (two) times daily.     spironolactone (ALDACTONE) 25 MG tablet Take 0.5 tablets (12.5 mg total) by mouth daily. 90 tablet 3   tadalafil (CIALIS) 20 MG tablet TAKE ONE TABLET BY MOUTH DAILY AS NEEDED 30 tablet 11   No current facility-administered medications for this encounter.   BP 100/60    Pulse 78    Wt 72.8 kg (160 lb 6.4 oz)    BMI 23.52 kg/m  General: NAD Neck: No JVD, no thyromegaly or thyroid nodule.  Lungs: Clear to auscultation bilaterally with normal respiratory effort. CV: Nondisplaced PMI.  Heart irregular S1/S2, no S3/S4, 1/6 HSM LLSB/apex.  No peripheral edema.  No carotid bruit.   Normal pedal pulses.  Abdomen: Soft, nontender, no hepatosplenomegaly, no distention.  Skin: Intact without lesions or rashes.  Neurologic: Alert and oriented x 3.  Psych: Normal affect. Extremities: No clubbing or cyanosis.  HEENT: Normal.   Assessment/Plan: 1. Chronic diastolic CHF: Echo in 1/77 with EF 60-65%, normal RV, PASP 71 mmHg, severe biatrial enlargement, severe TR, probably moderate MR, IVC dilated.  In the summer of 2022, he developed symptomatic CHF, this has improved with diuresis and sodium restriction.  TEE showed severe functional TR and severe primary mitral regurgitation with partial flail posterior leaflet (P3).  I suspect that CHF is due to chronic atrial fibrillation + valvular disease (MR, TR).  He is now s/p Mitraclip, last echo in 12/22 showed EF 60-65%, D-shaped septum with moderate RV enlargement and mildly decreased systolic function, PASP 55 mmHg, severe biatrial enlargement, 2 Mitraclips with only 1 attached to both leaflets, mild MR with mean gradient 3 mmHg, severe TR, small iatrogenic secundum ASD, dilated IVC. He describes minimal symptoms currently, NYHA class I-II.  Not volume overloaded.  - Continue Lasix 40 mg daily with BMET/BNP today.  - Continue empagliflozin 10 mg daily.    - Continue spironolactone 12.5 daily.   2. Pulmonary hypertension: Pulmonary venous hypertension by 9/22 RHC.   3. Atrial fibrillation: This is chronic and likely permanent (present since 2017).  The atria are severely dilated on echo. I do not think that he would be likely to hold NSR with DCCV, even with anti-arrhythmic.  - Continue Eliquis.  - Good rate control on current Toprol XL.  4. Valvular heart disease: He is s/p Mitraclip with 12/22 echo showing mild MR and minimal MS, but on last echo, he still had severe TR.  Symptomatically improved s/p Mitraclip.  Minimal murmur on exam.  - I will arrange for repeat echo.  If he still has severe TR, he is interested in evaluation at  [facility] for percutaneous TV repair.  Followup in 3 months.   Loralie Champagne 07/25/2021

## 2021-07-27 ENCOUNTER — Other Ambulatory Visit (HOSPITAL_COMMUNITY): Payer: Self-pay

## 2021-07-27 ENCOUNTER — Other Ambulatory Visit: Payer: Self-pay

## 2021-07-27 ENCOUNTER — Encounter (HOSPITAL_COMMUNITY)
Admission: RE | Admit: 2021-07-27 | Discharge: 2021-07-27 | Disposition: A | Discharge status: Home / Self Care | Payer: Medicare PPO | Source: Ambulatory Visit | Attending: Cardiovascular Disease | Admitting: Cardiovascular Disease

## 2021-07-27 DIAGNOSIS — Z9889 Other specified postprocedural states: Secondary | ICD-10-CM

## 2021-07-28 NOTE — Progress Notes (Signed)
Cardiac Individual Treatment Plan ? ?Patient Details  ?Name: John Holder ?MRN: [id number] ?Date of Birth: September 01, 1931 ?Referring Provider:   ?Flowsheet Row CARDIAC REHAB PHASE II ORIENTATION from 07/02/2021 in [place]  ?Referring Provider Loralie Champagne, MD  ? ?  ? ? ?Initial Encounter Date:  ?Flowsheet Row CARDIAC REHAB PHASE II ORIENTATION from 07/02/2021 in [place]  ?Date 07/02/21  ? ?  ? ? ?Visit Diagnosis: 03/19/21 S/P mitral valve repair ? ?Patient's Home Medications on Admission: ? ?Current Outpatient Medications:  ?  ALPRAZolam (XANAX) 0.25 MG tablet, Take 1 tablet (0.25 mg total) by mouth every 6 (six) hours as needed for anxiety, Disp: 30 tablet, Rfl: 0 ?  apixaban (ELIQUIS) 5 MG TABS tablet, TAKE 1 TABLET BY MOUTH TWICE DAILY, Disp: 180 tablet, Rfl: 3 ?  Apoaequorin (PREVAGEN EXTRA STRENGTH PO), Take 1 tablet by mouth daily., Disp: , Rfl:  ?  Barberry-Oreg Grape-Goldenseal (BERBERINE COMPLEX PO), Take 2,400 mg by mouth daily., Disp: , Rfl:  ?  Chromium 1000 MCG TABS, Take 1,000 mcg by mouth daily. chromium picolinate, Disp: , Rfl:  ?  Coenzyme Q10 (CO Q10) 200 MG CAPS, Take 200 mg by mouth daily., Disp: , Rfl:  ?  empagliflozin (JARDIANCE) 10 MG TABS tablet, Take 1 tablet (10 mg total) by mouth daily before breakfast., Disp: 30 tablet, Rfl: 6 ?  ferrous gluconate (FERGON) 324 MG tablet, Take 324 mg by mouth daily with breakfast., Disp: , Rfl:  ?  furosemide (LASIX) 40 MG tablet, Take 1 tablet (40 mg total) by mouth daily., Disp: 30 tablet, Rfl: 3 ?  KRILL OIL PO, Take 1 capsule by mouth daily at 12 noon., Disp: , Rfl:  ?  losartan (COZAAR) 50 MG tablet, Take 1 tablet (50 mg total) by mouth daily., Disp: 90 tablet, Rfl: 1 ?  MAGNESIUM GLYCINATE PO, Take 525 mg by mouth in the morning and at bedtime., Disp: , Rfl:  ?  Melatonin 10 MG TABS, Take 10 mg by mouth at bedtime., Disp: , Rfl:  ?  metoprolol succinate (TOPROL-XL) 50 MG 24 hr tablet,  Take 1 tablet (50 mg total) by mouth 2 (two) times daily. Take with or immediately following a meal, Disp: 180 tablet, Rfl: 1 ?  Misc Natural Products (ADVANCED JOINT RELIEF PO), Take 1 tablet by mouth 2 (two) times daily., Disp: , Rfl:  ?  Multiple Vitamin (MULTIVITAMIN) capsule, Take 1 capsule by mouth daily. Mega men 50 plus, Disp: , Rfl:  ?  Multiple Vitamins-Minerals (ZINC PO), Take by mouth., Disp: , Rfl:  ?  NON FORMULARY, Take 300 mg by mouth 2 (two) times daily. TRU vitamin  Nigen, Disp: , Rfl:  ?  omeprazole (PRILOSEC) 20 MG capsule, Take 1 capsule (20 mg total) by mouth daily., Disp: 90 capsule, Rfl: 3 ?  OVER THE COUNTER MEDICATION, Take 2 capsules by mouth daily. Prostacare, Disp: , Rfl:  ?  OVER THE COUNTER MEDICATION, Take 1 capsule by mouth 2 (two) times daily. [place], Disp: , Rfl:  ?  OVER THE COUNTER MEDICATION, Take 1 tablet by mouth 2 (two) times daily. virectin, Disp: , Rfl:  ?  OVER THE COUNTER MEDICATION, Take 1 tablet by mouth daily. Liver care, Disp: , Rfl:  ?  OVER THE COUNTER MEDICATION, Take 1 tablet by mouth 2 (two) times daily. Tri Flex, Disp: , Rfl:  ?  OVER THE COUNTER MEDICATION, Take 1 capsule by mouth 2 (two) times  daily. conjugated linoleic acid, Disp: , Rfl:  ?  OVER THE COUNTER MEDICATION, Take 1 tablet by mouth 2 (two) times daily. CDB Gummies, Disp: , Rfl:  ?  OVER THE COUNTER MEDICATION, Take 1 capsule by mouth 2 (two) times daily. Hemp oil gummies, Disp: , Rfl:  ?  OVER THE COUNTER MEDICATION, Take 1 tablet by mouth daily. arthrozene, Disp: , Rfl:  ?  Polyethyl Glycol-Propyl Glycol (SYSTANE) 0.4-0.3 % SOLN, 1 drop daily as needed (as needed for dry eye)., Disp: , Rfl:  ?  Sodium Hyaluronate, oral, (HYALURONIC ACID) 100 MG CAPS, Take 100 mg by mouth 2 (two) times daily., Disp: , Rfl:  ?  spironolactone (ALDACTONE) 25 MG tablet, Take 0.5 tablets (12.5 mg total) by mouth daily., Disp: 90 tablet, Rfl: 3 ?  tadalafil (CIALIS) 20 MG tablet, TAKE ONE TABLET BY MOUTH DAILY  AS NEEDED, Disp: 30 tablet, Rfl: 11 ? ?Past Medical History: ?Past Medical History:  ?Diagnosis Date  ? Blood transfusion without reported diagnosis   ? Cataract   ? removed in replace bilateral  ? CHF (congestive heart failure) ([place])   ? Dysrhythmia   ? GERD (gastroesophageal reflux disease)   ? Hypertension   ? Iron deficiency anemia   ? amdx 08/2018 >> Tx with PRBCs and Feraheme started; no GI source per workup; followed by Heme (Dr. Jana Hakim) and GI (Dr. Cristina Gong)   ? Left renal mass   ? s/p L nephrectomy  ? Mitral regurgitation   ? Echo 02/2016:  Mild conc LVH, EF 55-60, mild to mod MR, mod LAE, mod to severe RAE, mod TR, PASP 41 // Echo 10/2018: EF 60-65, mod conc LVH, severe BAE, trivial eff, mild MVP with mod MR, mod to severe TR, asc aorta 36 (mild dilation)   ? Persistent Atrial Fibrillation   ? Pulmonary hypertension ([place])   ? Echocardiogram 7/22: EF 60-65, no RWMA, normal RVSF, RVSP 70.7 (severe pulmonary hypertension), severe BAE, mod MR, severe TR, mild to mod AV sclerosis w/o AS  ? S/P mitral valve clip implantation 02/26/2021  ? G4 NT W device; A3/P3 with Dr. Burt Knack  ? ? ?Tobacco Use: ?Social History  ? ?Tobacco Use  ?Smoking Status Never  ?Smokeless Tobacco Never  ? ? ?Labs: ?Recent Review Flowsheet Data   ? ? Labs for ITP Cardiac and Pulmonary Rehab Latest Ref Rng & Units 09/08/2018 01/27/2021 01/27/2021 02/23/2021 03/17/2021  ? Trlycerides <150 mg/dL 31 - - - -  ? Hemoglobin A1c 4.8 - 5.6 % - - - - 5.4  ? PHART 7.350 - 7.450 - - - 7.389 7.421  ? PCO2ART 32.0 - 48.0 mmHg - - - 38.4 37.1  ? HCO3 20.0 - 28.0 mmol/L - 25.5 25.2 22.6 23.6  ? TCO2 22 - 32 mmol/L - 27 27 - -  ? ACIDBASEDEF 0.0 - 2.0 mmol/L - 2.0 2.0 1.6 0.3  ? O2SAT % - 73.0 76.0 96.8 97.9  ? ?  ? ? ?Capillary Blood Glucose: ?Lab Results  ?Component Value Date  ? GLUCAP 112 (H) 02/26/2021  ? ? ? ?Exercise Target Goals: ?Exercise Program Goal: ?Individual exercise prescription set using results from initial 6 min walk test and THRR while  considering  patient?s activity barriers and safety.  ? ?Exercise Prescription Goal: ?Initial exercise prescription builds to 30-45 minutes a day of aerobic activity, 2-3 days per week.  Home exercise guidelines will be given to patient during program as part of exercise prescription that the participant will acknowledge. ? ?  Activity Barriers & Risk Stratification: ? Activity Barriers & Cardiac Risk Stratification - 07/02/21 1343   ? ?  ? Activity Barriers & Cardiac Risk Stratification  ? Activity Barriers Arthritis;Joint Problems;Muscular Weakness;Balance Concerns   ? Cardiac Risk Stratification High   ? ?  ?  ? ?  ? ? ?6 Minute Walk: ? 6 Minute Walk   ? ? [place] Name 07/02/21 1342  ?  ?  ?  ? 6 Minute Walk  ? Phase Initial    ? Distance 1483 feet    ? Walk Time 6 minutes    ? # of Rest Breaks 0    ? MPH 2.81    ? METS 2.63    ? RPE 11    ? Perceived Dyspnea  0    ? VO2 Peak 9.22    ? Symptoms No    ? Resting HR 81 bpm    ? Resting BP 118/70    ? Resting Oxygen Saturation  95 %    ? Exercise Oxygen Saturation  during 6 min walk 96 %    ? Max Ex. HR 109 bpm    ? Max Ex. BP 132/80    ? 2 Minute Post BP 108/60    ? ?  ?  ? ?  ? ? ?Oxygen Initial Assessment: ? ? ?Oxygen Re-Evaluation: ? ? ?Oxygen Discharge (Final Oxygen Re-Evaluation): ? ? ?Initial Exercise Prescription: ? Initial Exercise Prescription - 07/02/21 1300   ? ?  ? Date of Initial Exercise RX and Referring Provider  ? Date 07/02/21   ? Referring Provider Loralie Champagne, MD   ? Expected Discharge Date 08/28/21   ?  ? NuStep  ? Level 2   ? SPM 75   ? Minutes 15   ? METs 2.4   ?  ? Arm Ergometer  ? Level 1.5   ? RPM 60   ? Minutes 15   ? METs 2.5   ?  ? Prescription Details  ? Frequency (times per week) 3   ? Duration Progress to 30 minutes of continuous aerobic without signs/symptoms of physical distress   ?  ? Intensity  ? THRR 40-80% of Max Heartrate 53-106   ? Ratings of Perceived Exertion 11-13   ? Perceived Dyspnea 0-4   ?  ? Progression  ? Progression  Continue progressive overload as per policy without signs/symptoms or physical distress.   ?  ? Resistance Training  ? Training Prescription Yes   ? Weight 3 lbs   ? Reps 10-15   ? ?  ?  ? ?  ? ? ?Perform [place]

## 2021-07-29 ENCOUNTER — Encounter (HOSPITAL_COMMUNITY)
Admission: RE | Admit: 2021-07-29 | Discharge: 2021-07-29 | Disposition: A | Discharge status: Home / Self Care | Payer: Medicare PPO | Source: Ambulatory Visit | Attending: Cardiovascular Disease | Admitting: Cardiovascular Disease

## 2021-07-29 ENCOUNTER — Other Ambulatory Visit: Payer: Self-pay

## 2021-07-29 DIAGNOSIS — Z9889 Other specified postprocedural states: Secondary | ICD-10-CM | POA: Diagnosis not present

## 2021-07-30 ENCOUNTER — Ambulatory Visit (HOSPITAL_COMMUNITY)
Admission: RE | Admit: 2021-07-30 | Discharge: 2021-07-30 | Disposition: A | Discharge status: Home / Self Care | Payer: Medicare PPO | Source: Ambulatory Visit | Attending: Internal Medicine | Admitting: Internal Medicine

## 2021-07-30 DIAGNOSIS — Z952 Presence of prosthetic heart valve: Secondary | ICD-10-CM

## 2021-07-30 DIAGNOSIS — I4891 Unspecified atrial fibrillation: Secondary | ICD-10-CM | POA: Insufficient documentation

## 2021-07-30 DIAGNOSIS — I272 Pulmonary hypertension, unspecified: Secondary | ICD-10-CM | POA: Diagnosis not present

## 2021-07-30 DIAGNOSIS — I509 Heart failure, unspecified: Secondary | ICD-10-CM | POA: Insufficient documentation

## 2021-07-30 DIAGNOSIS — I081 Rheumatic disorders of both mitral and tricuspid valves: Secondary | ICD-10-CM | POA: Diagnosis not present

## 2021-07-30 DIAGNOSIS — I340 Nonrheumatic mitral (valve) insufficiency: Secondary | ICD-10-CM

## 2021-07-30 DIAGNOSIS — I110 Hypertensive heart disease with heart failure: Secondary | ICD-10-CM | POA: Diagnosis not present

## 2021-07-30 LAB — ECHOCARDIOGRAM COMPLETE: S' Lateral: 3.3 cm

## 2021-07-30 NOTE — Progress Notes (Signed)
?  Echocardiogram ?2D Echocardiogram has been performed. ? ?John Holder ?07/30/2021, 2:21 PM ?

## 2021-07-31 ENCOUNTER — Encounter (HOSPITAL_COMMUNITY)
Admission: RE | Admit: 2021-07-31 | Discharge: 2021-07-31 | Disposition: A | Discharge status: Home / Self Care | Payer: Medicare PPO | Source: Ambulatory Visit | Attending: Cardiovascular Disease | Admitting: Cardiovascular Disease

## 2021-07-31 ENCOUNTER — Other Ambulatory Visit: Payer: Self-pay

## 2021-07-31 DIAGNOSIS — Z9889 Other specified postprocedural states: Secondary | ICD-10-CM

## 2021-08-03 ENCOUNTER — Encounter (HOSPITAL_COMMUNITY): Payer: Medicare PPO

## 2021-08-05 ENCOUNTER — Encounter (HOSPITAL_COMMUNITY): Payer: Medicare PPO

## 2021-08-06 ENCOUNTER — Encounter (HOSPITAL_COMMUNITY): Payer: Self-pay | Admitting: Cardiology

## 2021-08-07 ENCOUNTER — Encounter (HOSPITAL_COMMUNITY): Payer: Medicare PPO

## 2021-08-10 ENCOUNTER — Encounter (HOSPITAL_COMMUNITY): Payer: Medicare PPO

## 2021-08-12 ENCOUNTER — Encounter (HOSPITAL_COMMUNITY)
Admission: RE | Admit: 2021-08-12 | Discharge: 2021-08-12 | Disposition: A | Discharge status: Home / Self Care | Payer: Medicare PPO | Source: Ambulatory Visit | Attending: Cardiovascular Disease | Admitting: Cardiovascular Disease

## 2021-08-12 DIAGNOSIS — Z9889 Other specified postprocedural states: Secondary | ICD-10-CM | POA: Diagnosis not present

## 2021-08-13 ENCOUNTER — Other Ambulatory Visit (HOSPITAL_COMMUNITY): Payer: Self-pay

## 2021-08-13 ENCOUNTER — Other Ambulatory Visit (HOSPITAL_COMMUNITY): Payer: Self-pay | Admitting: Cardiology

## 2021-08-13 MED ORDER — EMPAGLIFLOZIN 10 MG PO TABS
10.0000 mg | ORAL_TABLET | Freq: Every day | ORAL | 6 refills | Status: DC
Start: 2021-08-13 — End: 2022-03-15
  Filled 2021-08-13: qty 30, 30d supply, fill #0
  Filled 2021-09-10: qty 30, 30d supply, fill #1
  Filled 2021-10-14: qty 30, 30d supply, fill #2
  Filled 2021-11-08: qty 30, 30d supply, fill #3
  Filled 2021-12-10: qty 30, 30d supply, fill #4
  Filled 2022-01-08: qty 30, 30d supply, fill #5
  Filled 2022-02-09: qty 30, 30d supply, fill #6

## 2021-08-14 ENCOUNTER — Encounter (HOSPITAL_COMMUNITY)
Admission: RE | Admit: 2021-08-14 | Discharge: 2021-08-14 | Disposition: A | Discharge status: Home / Self Care | Payer: Medicare PPO | Source: Ambulatory Visit | Attending: Cardiovascular Disease | Admitting: Cardiovascular Disease

## 2021-08-14 ENCOUNTER — Other Ambulatory Visit (HOSPITAL_COMMUNITY): Payer: Self-pay

## 2021-08-14 DIAGNOSIS — Z9889 Other specified postprocedural states: Secondary | ICD-10-CM | POA: Diagnosis not present

## 2021-08-17 ENCOUNTER — Encounter (HOSPITAL_COMMUNITY)
Admission: RE | Admit: 2021-08-17 | Discharge: 2021-08-17 | Disposition: A | Discharge status: Home / Self Care | Payer: Medicare PPO | Source: Ambulatory Visit | Attending: Cardiovascular Disease | Admitting: Cardiovascular Disease

## 2021-08-17 DIAGNOSIS — Z952 Presence of prosthetic heart valve: Secondary | ICD-10-CM | POA: Diagnosis not present

## 2021-08-17 DIAGNOSIS — Z9889 Other specified postprocedural states: Secondary | ICD-10-CM

## 2021-08-17 DIAGNOSIS — Z48812 Encounter for surgical aftercare following surgery on the circulatory system: Secondary | ICD-10-CM | POA: Insufficient documentation

## 2021-08-19 ENCOUNTER — Encounter (HOSPITAL_COMMUNITY)
Admission: RE | Admit: 2021-08-19 | Discharge: 2021-08-19 | Disposition: A | Discharge status: Home / Self Care | Payer: Medicare PPO | Source: Ambulatory Visit | Attending: Cardiovascular Disease | Admitting: Cardiovascular Disease

## 2021-08-19 DIAGNOSIS — Z9889 Other specified postprocedural states: Secondary | ICD-10-CM

## 2021-08-19 DIAGNOSIS — Z952 Presence of prosthetic heart valve: Secondary | ICD-10-CM | POA: Diagnosis not present

## 2021-08-19 DIAGNOSIS — Z48812 Encounter for surgical aftercare following surgery on the circulatory system: Secondary | ICD-10-CM | POA: Diagnosis not present

## 2021-08-21 ENCOUNTER — Encounter (HOSPITAL_COMMUNITY)
Admission: RE | Admit: 2021-08-21 | Discharge: 2021-08-21 | Disposition: A | Discharge status: Home / Self Care | Payer: Medicare PPO | Source: Ambulatory Visit | Attending: Cardiovascular Disease | Admitting: Cardiovascular Disease

## 2021-08-21 DIAGNOSIS — Z952 Presence of prosthetic heart valve: Secondary | ICD-10-CM | POA: Diagnosis not present

## 2021-08-21 DIAGNOSIS — Z9889 Other specified postprocedural states: Secondary | ICD-10-CM

## 2021-08-21 DIAGNOSIS — Z48812 Encounter for surgical aftercare following surgery on the circulatory system: Secondary | ICD-10-CM | POA: Diagnosis not present

## 2021-08-24 ENCOUNTER — Encounter (HOSPITAL_COMMUNITY)
Admission: RE | Admit: 2021-08-24 | Discharge: 2021-08-24 | Disposition: A | Discharge status: Home / Self Care | Payer: Medicare PPO | Source: Ambulatory Visit | Attending: Cardiovascular Disease | Admitting: Cardiovascular Disease

## 2021-08-24 DIAGNOSIS — Z952 Presence of prosthetic heart valve: Secondary | ICD-10-CM | POA: Diagnosis not present

## 2021-08-24 DIAGNOSIS — Z48812 Encounter for surgical aftercare following surgery on the circulatory system: Secondary | ICD-10-CM | POA: Diagnosis not present

## 2021-08-24 DIAGNOSIS — Z9889 Other specified postprocedural states: Secondary | ICD-10-CM

## 2021-08-25 NOTE — Progress Notes (Signed)
Cardiac Individual Treatment Plan ? ?Patient Details  ?Name: John Holder ?MRN: [id number] ?Date of Birth: May 03, 1932 ?Referring Provider:   ?Flowsheet Row CARDIAC REHAB PHASE II ORIENTATION from 07/02/2021 in [place]  ?Referring Provider Loralie Champagne, MD  ? ?  ? ? ?Initial Encounter Date:  ?Flowsheet Row CARDIAC REHAB PHASE II ORIENTATION from 07/02/2021 in [place]  ?Date 07/02/21  ? ?  ? ? ?Visit Diagnosis: 03/19/21 S/P mitral valve repair ? ?Patient's Home Medications on Admission: ? ?Current Outpatient Medications:  ?  ALPRAZolam (XANAX) 0.25 MG tablet, Take 1 tablet (0.25 mg total) by mouth every 6 (six) hours as needed for anxiety, Disp: 30 tablet, Rfl: 0 ?  apixaban (ELIQUIS) 5 MG TABS tablet, TAKE 1 TABLET BY MOUTH TWICE DAILY, Disp: 180 tablet, Rfl: 3 ?  Apoaequorin (PREVAGEN EXTRA STRENGTH PO), Take 1 tablet by mouth daily., Disp: , Rfl:  ?  Barberry-Oreg Grape-Goldenseal (BERBERINE COMPLEX PO), Take 2,400 mg by mouth daily., Disp: , Rfl:  ?  Chromium 1000 MCG TABS, Take 1,000 mcg by mouth daily. chromium picolinate, Disp: , Rfl:  ?  Coenzyme Q10 (CO Q10) 200 MG CAPS, Take 200 mg by mouth daily., Disp: , Rfl:  ?  empagliflozin (JARDIANCE) 10 MG TABS tablet, Take 1 tablet (10 mg total) by mouth daily before breakfast., Disp: 30 tablet, Rfl: 6 ?  ferrous gluconate (FERGON) 324 MG tablet, Take 324 mg by mouth daily with breakfast., Disp: , Rfl:  ?  furosemide (LASIX) 40 MG tablet, Take 1 tablet (40 mg total) by mouth daily., Disp: 30 tablet, Rfl: 3 ?  KRILL OIL PO, Take 1 capsule by mouth daily at 12 noon., Disp: , Rfl:  ?  losartan (COZAAR) 50 MG tablet, Take 1 tablet (50 mg total) by mouth daily., Disp: 90 tablet, Rfl: 1 ?  MAGNESIUM GLYCINATE PO, Take 525 mg by mouth in the morning and at bedtime., Disp: , Rfl:  ?  Melatonin 10 MG TABS, Take 10 mg by mouth at bedtime., Disp: , Rfl:  ?  metoprolol succinate (TOPROL-XL) 50 MG 24 hr tablet,  Take 1 tablet (50 mg total) by mouth 2 (two) times daily. Take with or immediately following a meal, Disp: 180 tablet, Rfl: 1 ?  Misc Natural Products (ADVANCED JOINT RELIEF PO), Take 1 tablet by mouth 2 (two) times daily., Disp: , Rfl:  ?  Multiple Vitamin (MULTIVITAMIN) capsule, Take 1 capsule by mouth daily. Mega men 50 plus, Disp: , Rfl:  ?  Multiple Vitamins-Minerals (ZINC PO), Take by mouth., Disp: , Rfl:  ?  NON FORMULARY, Take 300 mg by mouth 2 (two) times daily. TRU vitamin  Nigen, Disp: , Rfl:  ?  omeprazole (PRILOSEC) 20 MG capsule, Take 1 capsule (20 mg total) by mouth daily., Disp: 90 capsule, Rfl: 3 ?  OVER THE COUNTER MEDICATION, Take 2 capsules by mouth daily. Prostacare, Disp: , Rfl:  ?  OVER THE COUNTER MEDICATION, Take 1 capsule by mouth 2 (two) times daily. Crane, Disp: , Rfl:  ?  OVER THE COUNTER MEDICATION, Take 1 tablet by mouth 2 (two) times daily. virectin, Disp: , Rfl:  ?  OVER THE COUNTER MEDICATION, Take 1 tablet by mouth daily. Liver care, Disp: , Rfl:  ?  OVER THE COUNTER MEDICATION, Take 1 tablet by mouth 2 (two) times daily. Tri Flex, Disp: , Rfl:  ?  OVER THE COUNTER MEDICATION, Take 1 capsule by mouth 2 (two) times  daily. conjugated linoleic acid, Disp: , Rfl:  ?  OVER THE COUNTER MEDICATION, Take 1 tablet by mouth 2 (two) times daily. CDB Gummies, Disp: , Rfl:  ?  OVER THE COUNTER MEDICATION, Take 1 capsule by mouth 2 (two) times daily. Hemp oil gummies, Disp: , Rfl:  ?  OVER THE COUNTER MEDICATION, Take 1 tablet by mouth daily. arthrozene, Disp: , Rfl:  ?  Polyethyl Glycol-Propyl Glycol (SYSTANE) 0.4-0.3 % SOLN, 1 drop daily as needed (as needed for dry eye)., Disp: , Rfl:  ?  Sodium Hyaluronate, oral, (HYALURONIC ACID) 100 MG CAPS, Take 100 mg by mouth 2 (two) times daily., Disp: , Rfl:  ?  spironolactone (ALDACTONE) 25 MG tablet, Take 0.5 tablets (12.5 mg total) by mouth daily., Disp: 90 tablet, Rfl: 3 ?  tadalafil (CIALIS) 20 MG tablet, TAKE ONE TABLET BY MOUTH DAILY  AS NEEDED, Disp: 30 tablet, Rfl: 11 ? ?Past Medical History: ?Past Medical History:  ?Diagnosis Date  ? Blood transfusion without reported diagnosis   ? Cataract   ? removed in replace bilateral  ? CHF (congestive heart failure) ([place])   ? Dysrhythmia   ? GERD (gastroesophageal reflux disease)   ? Hypertension   ? Iron deficiency anemia   ? amdx 08/2018 >> Tx with PRBCs and Feraheme started; no GI source per workup; followed by Heme (Dr. Jana Hakim) and GI (Dr. Cristina Gong)   ? Left renal mass   ? s/p L nephrectomy  ? Mitral regurgitation   ? Echo 02/2016:  Mild conc LVH, EF 55-60, mild to mod MR, mod LAE, mod to severe RAE, mod TR, PASP 41 // Echo 10/2018: EF 60-65, mod conc LVH, severe BAE, trivial eff, mild MVP with mod MR, mod to severe TR, asc aorta 36 (mild dilation)   ? Persistent Atrial Fibrillation   ? Pulmonary hypertension ([place])   ? Echocardiogram 7/22: EF 60-65, no RWMA, normal RVSF, RVSP 70.7 (severe pulmonary hypertension), severe BAE, mod MR, severe TR, mild to mod AV sclerosis w/o AS  ? S/P mitral valve clip implantation 02/26/2021  ? G4 NT W device; A3/P3 with Dr. Burt Knack  ? ? ?Tobacco Use: ?Social History  ? ?Tobacco Use  ?Smoking Status Never  ?Smokeless Tobacco Never  ? ? ?Labs: ?Review Flowsheet   ? ?  ?  Latest Ref Rng & Units 09/08/2018 01/27/2021 02/23/2021 03/17/2021  ?Labs for ITP Cardiac and Pulmonary Rehab  ?Trlycerides <150 mg/dL 31       ?Hemoglobin A1c 4.8 - 5.6 %    5.4    ?PH, Arterial 7.350 - 7.450   7.389   7.421    ?PCO2 arterial 32.0 - 48.0 mmHg   38.4   37.1    ?Bicarbonate 20.0 - 28.0 mmol/L  25.5    ? 25.2   22.6   23.6    ?TCO2 22 - 32 mmol/L  27    ? 27      ?Acid-base deficit 0.0 - 2.0 mmol/L  2.0    ? 2.0   1.6   0.3    ?O2 Saturation %  73.0    ? 76.0   96.8   97.9    ?  ? ? Multiple values from one day are sorted in reverse-chronological order  ?  ?  ? ? ?Capillary Blood Glucose: ?Lab Results  ?Component Value Date  ? GLUCAP 112 (H) 02/26/2021  ? ? ? ?Exercise Target Goals: ?Exercise  Program Goal: ?Individual exercise prescription set using results from initial  6 min walk test and THRR while considering  patient?s activity barriers and safety.  ? ?Exercise Prescription Goal: ?Starting with aerobic activity 30 plus minutes a day, 3 days per week for initial exercise prescription. Provide home exercise prescription and guidelines that participant acknowledges understanding prior to discharge. ? ?Activity Barriers & Risk Stratification: ? Activity Barriers & Cardiac Risk Stratification - 07/02/21 1343   ? ?  ? Activity Barriers & Cardiac Risk Stratification  ? Activity Barriers Arthritis;Joint Problems;Muscular Weakness;Balance Concerns   ? Cardiac Risk Stratification High   ? ?  ?  ? ?  ? ? ?6 Minute Walk: ? 6 Minute Walk   ? ? [place] Name 07/02/21 1342  ?  ?  ?  ? 6 Minute Walk  ? Phase Initial    ? Distance 1483 feet    ? Walk Time 6 minutes    ? # of Rest Breaks 0    ? MPH 2.81    ? METS 2.63    ? RPE 11    ? Perceived Dyspnea  0    ? VO2 Peak 9.22    ? Symptoms No    ? Resting HR 81 bpm    ? Resting BP 118/70    ? Resting Oxygen Saturation  95 %    ? Exercise Oxygen Saturation  during 6 min walk 96 %    ? Max Ex. HR 109 bpm    ? Max Ex. BP 132/80    ? 2 Minute Post BP 108/60    ? ?  ?  ? ?  ? ? ?Oxygen Initial Assessment: ? ? ?Oxygen Re-Evaluation: ? ? ?Oxygen Discharge (Final Oxygen Re-Evaluation): ? ? ?Initial Exercise Prescription: ? Initial Exercise Prescription - 07/02/21 1300   ? ?  ? Date of Initial Exercise RX and Referring Provider  ? Date 07/02/21   ? Referring Provider Loralie Champagne, MD   ? Expected Discharge Date 08/28/21   ?  ? NuStep  ? Level 2   ? SPM 75   ? Minutes 15   ? METs 2.4   ?  ? Arm Ergometer  ? Level 1.5   ? RPM 60   ? Minutes 15   ? METs 2.5   ?  ? Prescription Details  ? Frequency (times per week) 3   ? Duration Progress to 30 minutes of continuous aerobic without signs/symptoms of physical distress   ?  ? Intensity  ? THRR 40-80% of Max Heartrate 53-106   ? Ratings of  Perceived Exertion 11-13   ? Perceived Dyspnea 0-4   ?  ? Progression  ? Progression Continue progressive overload as per policy without signs/symptoms or physical distress.   ?  ? Resistance Trainin

## 2021-08-26 ENCOUNTER — Encounter (HOSPITAL_COMMUNITY)
Admission: RE | Admit: 2021-08-26 | Discharge: 2021-08-26 | Disposition: A | Discharge status: Home / Self Care | Payer: Medicare PPO | Source: Ambulatory Visit | Attending: Cardiovascular Disease | Admitting: Cardiovascular Disease

## 2021-08-26 VITALS — Ht 69.25 in | Wt 160.3 lb

## 2021-08-26 DIAGNOSIS — Z952 Presence of prosthetic heart valve: Secondary | ICD-10-CM | POA: Diagnosis not present

## 2021-08-26 DIAGNOSIS — Z48812 Encounter for surgical aftercare following surgery on the circulatory system: Secondary | ICD-10-CM | POA: Diagnosis not present

## 2021-08-26 DIAGNOSIS — Z9889 Other specified postprocedural states: Secondary | ICD-10-CM

## 2021-08-28 ENCOUNTER — Encounter (HOSPITAL_COMMUNITY)
Admission: RE | Admit: 2021-08-28 | Discharge: 2021-08-28 | Disposition: A | Discharge status: Home / Self Care | Payer: Medicare PPO | Source: Ambulatory Visit | Attending: Cardiovascular Disease | Admitting: Cardiovascular Disease

## 2021-08-28 ENCOUNTER — Other Ambulatory Visit (HOSPITAL_COMMUNITY): Payer: Self-pay

## 2021-08-28 DIAGNOSIS — Z48812 Encounter for surgical aftercare following surgery on the circulatory system: Secondary | ICD-10-CM | POA: Diagnosis not present

## 2021-08-28 DIAGNOSIS — Z952 Presence of prosthetic heart valve: Secondary | ICD-10-CM | POA: Diagnosis not present

## 2021-08-28 DIAGNOSIS — Z9889 Other specified postprocedural states: Secondary | ICD-10-CM

## 2021-08-28 NOTE — Progress Notes (Addendum)
Discharge Progress Report ? ?Patient Details  ?Name: John Holder ?MRN: [id number] ?Date of Birth: September 06, 1931 ?Referring Provider:   ?Flowsheet Row CARDIAC REHAB PHASE II ORIENTATION from 07/02/2021 in [facility]  ?Referring Provider Loralie Champagne, MD  ? ?  ? ? ? ?Number of Visits: 20 ? ?Reason for Discharge:  ?Patient reached a stable level of exercise. ?Patient independent in their exercise. ?Patient has met program and personal goals. ? ?Smoking History:  ?Social History  ? ?Tobacco Use  ?Smoking Status Never  ?Smokeless Tobacco Never  ? ? ?Diagnosis:  ?03/19/21 S/P mitral valve repair ? ?ADL UCSD: ? ? ?Initial Exercise Prescription: ? Initial Exercise Prescription - 07/02/21 1300   ? ?  ? Date of Initial Exercise RX and Referring Provider  ? Date 07/02/21   ? Referring Provider Loralie Champagne, MD   ? Expected Discharge Date 08/28/21   ?  ? NuStep  ? Level 2   ? SPM 75   ? Minutes 15   ? METs 2.4   ?  ? Arm Ergometer  ? Level 1.5   ? RPM 60   ? Minutes 15   ? METs 2.5   ?  ? Prescription Details  ? Frequency (times per week) 3   ? Duration Progress to 30 minutes of continuous aerobic without signs/symptoms of physical distress   ?  ? Intensity  ? THRR 40-80% of Max Heartrate 53-106   ? Ratings of Perceived Exertion 11-13   ? Perceived Dyspnea 0-4   ?  ? Progression  ? Progression Continue progressive overload as per policy without signs/symptoms or physical distress.   ?  ? Resistance Training  ? Training Prescription Yes   ? Weight 3 lbs   ? Reps 10-15   ? ?  ?  ? ?  ? ? ?Discharge Exercise Prescription (Final Exercise Prescription Changes): ? Exercise Prescription Changes - 08/28/21 1400   ? ?  ? Response to Exercise  ? Blood Pressure (Admit) 100/72   ? Blood Pressure (Exercise) 130/72   ? Blood Pressure (Exit) 108/72   ? Heart Rate (Admit) 85 bpm   ? Heart Rate (Exercise) 122 bpm   ? Heart Rate (Exit) 85 bpm   ? Rating of Perceived Exertion (Exercise) 12   ? Symptoms None   ?  Comments Pt graduated form the CRP2 program today   ? Duration Continue with 30 min of aerobic exercise without signs/symptoms of physical distress.   ? Intensity THRR unchanged   ?  ? Progression  ? Progression Continue to progress workloads to maintain intensity without signs/symptoms of physical distress.   ?  ? Resistance Training  ? Training Prescription Yes   ? Weight 5 lbs   ? Reps 10-15   ? Time 10 Minutes   ?  ? Interval Training  ? Interval Training No   ?  ? NuStep  ? Level 4   ? Minutes 15   ? METs 2.5   ?  ? Arm Ergometer  ? Level 6   ? Watts 26   ? RPM 67   ? Minutes 15   ? METs 3.6   ?  ? Home Exercise Plan  ? Plans to continue exercise at Home (comment)   ? Frequency Add 3 additional days to program exercise sessions.   ? Initial Home Exercises Provided 07/20/21   ? ?  ?  ? ?  ? ? ?Functional Capacity: ? 6 Minute  Walk   ? ? [place] Name 07/02/21 1342 08/26/21 1308  ?  ?  ? 6 Minute Walk  ? Phase Initial Discharge   ? Distance 1483 feet 1681 feet   ? Distance % Change -- 13.35 %   ? Distance Feet Change -- 198 ft   ? Walk Time 6 minutes 6 minutes   ? # of Rest Breaks 0 0   ? MPH 2.81 3.18   ? METS 2.63 3.1   ? RPE 11 9   ? Perceived Dyspnea  0 0   ? VO2 Peak 9.22 10.69   ? Symptoms No No   ? Resting HR 81 bpm 67 bpm   ? Resting BP 118/70 122/80   ? Resting Oxygen Saturation  95 % 95 %   ? Exercise Oxygen Saturation  during 6 min walk 96 % 96 %   ? Max Ex. HR 109 bpm 111 bpm   ? Max Ex. BP 132/80 134/84   ? 2 Minute Post BP 108/60 --   ? ?  ?  ? ?  ? ? ?Psychological, QOL, Others - Outcomes: ?PHQ 2/9: ? ?  08/28/2021  ?  2:12 PM 07/02/2021  ? 10:44 AM 09/30/2014  ?  2:40 PM  ?Depression screen PHQ 2/9  ?Decreased Interest 0 0 0  ?Down, Depressed, Hopeless 0 0 0  ?PHQ - 2 Score 0 0 0  ? ? ?Quality of Life: ? Quality of Life - 08/26/21 1627   ? ?  ? Quality of Life Scores  ? Health/Function Pre 28.1 %   ? Health/Function Post 28.6 %   ? Health/Function % Change 1.78 %   ? Socioeconomic Pre 26.67 %   ?  Socioeconomic Post 29.17 %   ? Socioeconomic % Change  9.37 %   ? Psych/Spiritual Pre 25.07 %   ? Psych/Spiritual Post 28.21 %   ? Psych/Spiritual % Change 12.52 %   ? Family Pre 24 %   ? Family Post 27.6 %   ? Family % Change 15 %   ? GLOBAL Pre 26.58 %   ? GLOBAL Post 28.47 %   ? GLOBAL % Change 7.11 %   ? ?  ?  ? ?  ? ? ?Personal Goals: ?Goals established at orientation with interventions provided to work toward goal. ? Personal Goals and Risk Factors at Admission - 07/02/21 1340   ? ?  ? Core Components/Risk Factors/Patient Goals on Admission  ?  Weight Management Weight Maintenance   ? Hypertension Yes   ? Intervention Provide education on lifestyle modifcations including regular physical activity/exercise, weight management, moderate sodium restriction and increased consumption of fresh fruit, vegetables, and low fat dairy, alcohol moderation, and smoking cessation.;Monitor prescription use compliance.   ? Expected Outcomes Short Term: Continued assessment and intervention until BP is < 140/82m HG in hypertensive participants. < 130/86mHG in hypertensive participants with diabetes, heart failure or chronic kidney disease.;Long Term: Maintenance of blood pressure at goal levels.   ? ?  ?  ? ?  ?  ? ?Personal Goals Discharge: ? Goals and Risk Factor Review   ? ? [place] 07/06/21 1635 07/28/21 1457 08/25/21 1717  ?  ?  ?  ? Core Components/Risk Factors/Patient Goals Review  ? Personal Goals Review Hypertension Hypertension Hypertension    ? Review JiClair Gullingtarted cardiac rehab on 07/06/21. JiClair Gullingid well with exercise. Vital signs were stable. JiClair Gullingas been doing well with exercise at phase 2  cardiac rehab. at times systolic BP's have been in the upper 90's has been asymptomatic. Will continue to monitor BP [name] has been doing well with exercise at phase 2 cardiac rehab. at times systolic BP's have been in the upper 90's has been asymptomatic.[name] will complete phase 2 cardiac rehab on 08/28/21. [name] has lost 2 kg  since starting cardiac rehab.    ? Expected Outcomes [name] will continue to participate in cardiac rehab for exercise, nutrition and lifestyle modifications [name] will continue to participate in cardiac rehab for exercise, nutrition and lifestyle modifications [name] will continue to participate in cardiac rehab for exercise, nutrition and lifestyle modifications    ? ?  ?  ? ?  ? ? ?Exercise Goals and Review: ? Exercise Goals   ? ? [place] Name 07/02/21 1344  ?  ?  ?  ?  ?  ? Exercise Goals  ? Increase Physical Activity Yes      ? Intervention Provide advice, education, support and counseling about physical activity/exercise needs.;Develop an individualized exercise prescription for aerobic and resistive training based on initial evaluation findings, risk stratification, comorbidities and participant's personal goals.      ? Expected Outcomes Short Term: Attend rehab on a regular basis to increase amount of physical activity.;Long Term: Add in home exercise to make exercise part of routine and to increase amount of physical activity.;Long Term: Exercising regularly at least 3-5 days a week.      ? Increase Strength and Stamina Yes      ? Intervention Provide advice, education, support and counseling about physical activity/exercise needs.;Develop an individualized exercise prescription for aerobic and resistive training based on initial evaluation findings, risk stratification, comorbidities and participant's personal goals.      ? Expected Outcomes Short Term: Increase workloads from initial exercise prescription for resistance, speed, and METs.;Short Term: Perform resistance training exercises routinely during rehab and add in resistance training at home;Long Term: Improve cardiorespiratory fitness, muscular endurance and strength as measured by increased METs and functional capacity (6MWT)      ? Able to understand and use rate of perceived exertion (RPE) scale Yes      ? Intervention Provide education and explanation on  how to use RPE scale      ? Expected Outcomes Short Term: Able to use RPE daily in rehab to express subjective intensity level;Long Term:  Able to use RPE to guide intensity level when exercising independently      ? [place]

## 2021-09-07 ENCOUNTER — Other Ambulatory Visit (HOSPITAL_COMMUNITY): Payer: Self-pay

## 2021-09-07 DIAGNOSIS — I10 Essential (primary) hypertension: Secondary | ICD-10-CM | POA: Diagnosis not present

## 2021-09-07 DIAGNOSIS — R7301 Impaired fasting glucose: Secondary | ICD-10-CM | POA: Diagnosis not present

## 2021-09-07 DIAGNOSIS — I272 Pulmonary hypertension, unspecified: Secondary | ICD-10-CM | POA: Diagnosis not present

## 2021-09-07 DIAGNOSIS — I482 Chronic atrial fibrillation, unspecified: Secondary | ICD-10-CM | POA: Diagnosis not present

## 2021-09-07 DIAGNOSIS — I509 Heart failure, unspecified: Secondary | ICD-10-CM | POA: Diagnosis not present

## 2021-09-09 ENCOUNTER — Other Ambulatory Visit (HOSPITAL_COMMUNITY): Payer: Self-pay

## 2021-09-10 ENCOUNTER — Other Ambulatory Visit (HOSPITAL_COMMUNITY): Payer: Self-pay

## 2021-09-29 ENCOUNTER — Encounter (HOSPITAL_COMMUNITY): Payer: Self-pay | Admitting: Cardiology

## 2021-10-01 ENCOUNTER — Other Ambulatory Visit (HOSPITAL_COMMUNITY): Payer: Self-pay

## 2021-10-01 ENCOUNTER — Encounter (HOSPITAL_COMMUNITY): Payer: Self-pay | Admitting: Cardiology

## 2021-10-01 ENCOUNTER — Telehealth (HOSPITAL_COMMUNITY): Payer: Self-pay | Admitting: Cardiology

## 2021-10-01 MED ORDER — ALPRAZOLAM 0.25 MG PO TABS
0.2500 mg | ORAL_TABLET | Freq: Four times a day (QID) | ORAL | 0 refills | Status: DC | PRN
  Filled 2021-10-01: qty 30, 8d supply, fill #0

## 2021-10-01 NOTE — Telephone Encounter (Signed)
07/30/2021  4:22 PM EDT     Tricuspid regurgitation is still severe. I will would like to have him referred to Dr. Gerlene Fee at [facility] in [place] for consideration of tricuspid valve clipping. His nurse is [employer] [phone]. Please send images from his last echo. Her phone number is [phone].    I talked to Mr Budden about this at last appointment, make sure he knows that the TR is still severe and I am going to make the referral.     Detailed message left for Estill Bamberg to iron our referral process Daphne sent results and demographic information sent via fax. Patient updated on referral being resubmitted

## 2021-10-07 NOTE — Telephone Encounter (Signed)
Following up on referral [facility] with Estill Bamberg

## 2021-10-07 NOTE — Telephone Encounter (Signed)
Follow up call received from [place] referral was received in March 2023. referral was made for mitral clip and reviewed as such. Pt requested to have procedure done locally and pt seen by Dr Burt Knack.   Second structural heart referral for tricuspid regurgitation. Images with TEE echo and cath sent to  [facility] [address] [place] [address]  Records sent to [phone] attn Estill Bamberg

## 2021-10-09 ENCOUNTER — Other Ambulatory Visit (HOSPITAL_COMMUNITY): Payer: Self-pay

## 2021-10-13 ENCOUNTER — Other Ambulatory Visit (HOSPITAL_COMMUNITY): Payer: Self-pay

## 2021-10-14 ENCOUNTER — Other Ambulatory Visit (HOSPITAL_COMMUNITY): Payer: Self-pay

## 2021-10-15 ENCOUNTER — Other Ambulatory Visit (HOSPITAL_COMMUNITY): Payer: Self-pay

## 2021-10-15 DIAGNOSIS — N644 Mastodynia: Secondary | ICD-10-CM | POA: Diagnosis not present

## 2021-10-15 DIAGNOSIS — N6342 Unspecified lump in left breast, subareolar: Secondary | ICD-10-CM | POA: Diagnosis not present

## 2021-10-15 DIAGNOSIS — N62 Hypertrophy of breast: Secondary | ICD-10-CM | POA: Diagnosis not present

## 2021-10-16 ENCOUNTER — Other Ambulatory Visit: Payer: Self-pay | Admitting: Adult Health

## 2021-10-16 DIAGNOSIS — N6320 Unspecified lump in the left breast, unspecified quadrant: Secondary | ICD-10-CM

## 2021-10-27 ENCOUNTER — Other Ambulatory Visit (HOSPITAL_COMMUNITY): Payer: Self-pay | Admitting: Cardiology

## 2021-10-27 ENCOUNTER — Other Ambulatory Visit (HOSPITAL_COMMUNITY): Payer: Self-pay

## 2021-10-27 DIAGNOSIS — I272 Pulmonary hypertension, unspecified: Secondary | ICD-10-CM | POA: Diagnosis not present

## 2021-10-27 DIAGNOSIS — I361 Nonrheumatic tricuspid (valve) insufficiency: Secondary | ICD-10-CM | POA: Diagnosis not present

## 2021-10-27 DIAGNOSIS — I482 Chronic atrial fibrillation, unspecified: Secondary | ICD-10-CM | POA: Diagnosis not present

## 2021-10-27 DIAGNOSIS — I5032 Chronic diastolic (congestive) heart failure: Secondary | ICD-10-CM | POA: Diagnosis not present

## 2021-10-27 DIAGNOSIS — Z9889 Other specified postprocedural states: Secondary | ICD-10-CM | POA: Diagnosis not present

## 2021-10-27 MED ORDER — FUROSEMIDE 40 MG PO TABS
40.0000 mg | ORAL_TABLET | Freq: Every day | ORAL | 3 refills | Status: DC
Start: 2021-10-27 — End: 2022-07-15
  Filled 2021-10-27: qty 90, 90d supply, fill #0
  Filled 2022-01-30: qty 90, 90d supply, fill #1
  Filled 2022-05-26: qty 90, 90d supply, fill #2

## 2021-10-28 ENCOUNTER — Ambulatory Visit: Payer: Medicare PPO

## 2021-10-28 ENCOUNTER — Ambulatory Visit
Admission: RE | Admit: 2021-10-28 | Discharge: 2021-10-28 | Disposition: A | Discharge status: Home / Self Care | Payer: Medicare PPO | Source: Ambulatory Visit | Attending: Adult Health | Admitting: Adult Health

## 2021-10-28 DIAGNOSIS — N6320 Unspecified lump in the left breast, unspecified quadrant: Secondary | ICD-10-CM

## 2021-10-28 DIAGNOSIS — N62 Hypertrophy of breast: Secondary | ICD-10-CM | POA: Diagnosis not present

## 2021-11-08 ENCOUNTER — Other Ambulatory Visit: Payer: Self-pay | Admitting: Cardiovascular Disease

## 2021-11-09 ENCOUNTER — Other Ambulatory Visit: Payer: Self-pay | Admitting: Cardiovascular Disease

## 2021-11-09 ENCOUNTER — Other Ambulatory Visit (HOSPITAL_COMMUNITY): Payer: Self-pay

## 2021-11-09 ENCOUNTER — Encounter (HOSPITAL_COMMUNITY): Payer: Self-pay | Admitting: Cardiology

## 2021-11-09 ENCOUNTER — Ambulatory Visit (HOSPITAL_COMMUNITY)
Admission: RE | Admit: 2021-11-09 | Discharge: 2021-11-09 | Disposition: A | Discharge status: Home / Self Care | Payer: Medicare PPO | Source: Ambulatory Visit | Attending: Cardiology | Admitting: Cardiology

## 2021-11-09 VITALS — BP 110/70 | HR 76 | Wt 161.6 lb

## 2021-11-09 DIAGNOSIS — Z79899 Other long term (current) drug therapy: Secondary | ICD-10-CM | POA: Diagnosis not present

## 2021-11-09 DIAGNOSIS — Z9889 Other specified postprocedural states: Secondary | ICD-10-CM

## 2021-11-09 DIAGNOSIS — Z95818 Presence of other cardiac implants and grafts: Secondary | ICD-10-CM

## 2021-11-09 DIAGNOSIS — Z7901 Long term (current) use of anticoagulants: Secondary | ICD-10-CM | POA: Insufficient documentation

## 2021-11-09 DIAGNOSIS — I482 Chronic atrial fibrillation, unspecified: Secondary | ICD-10-CM | POA: Diagnosis not present

## 2021-11-09 DIAGNOSIS — I081 Rheumatic disorders of both mitral and tricuspid valves: Secondary | ICD-10-CM | POA: Diagnosis not present

## 2021-11-09 DIAGNOSIS — I071 Rheumatic tricuspid insufficiency: Secondary | ICD-10-CM

## 2021-11-09 DIAGNOSIS — I2510 Atherosclerotic heart disease of native coronary artery without angina pectoris: Secondary | ICD-10-CM | POA: Diagnosis not present

## 2021-11-09 DIAGNOSIS — I272 Pulmonary hypertension, unspecified: Secondary | ICD-10-CM | POA: Diagnosis not present

## 2021-11-09 DIAGNOSIS — I5032 Chronic diastolic (congestive) heart failure: Secondary | ICD-10-CM | POA: Diagnosis not present

## 2021-11-09 DIAGNOSIS — Z7984 Long term (current) use of oral hypoglycemic drugs: Secondary | ICD-10-CM | POA: Diagnosis not present

## 2021-11-09 DIAGNOSIS — K7460 Unspecified cirrhosis of liver: Secondary | ICD-10-CM | POA: Insufficient documentation

## 2021-11-09 LAB — CBC
HCT: 45 % (ref 39.0–52.0)
Hemoglobin: 15.2 g/dL (ref 13.0–17.0)
MCH: 32.4 pg (ref 26.0–34.0)
MCHC: 33.8 g/dL (ref 30.0–36.0)
MCV: 95.9 fL (ref 80.0–100.0)
Platelets: 133 10*3/uL — ABNORMAL LOW (ref 150–400)
RBC: 4.69 MIL/uL (ref 4.22–5.81)
RDW: 13.2 % (ref 11.5–15.5)
WBC: 3.4 10*3/uL — ABNORMAL LOW (ref 4.0–10.5)
nRBC: 0 % (ref 0.0–0.2)

## 2021-11-09 LAB — BASIC METABOLIC PANEL
Anion gap: 7 (ref 5–15)
BUN: 21 mg/dL (ref 8–23)
CO2: 29 mmol/L (ref 22–32)
Calcium: 9.2 mg/dL (ref 8.9–10.3)
Chloride: 103 mmol/L (ref 98–111)
Creatinine, Ser: 1.15 mg/dL (ref 0.61–1.24)
GFR, Estimated: >60 mL/min (ref >60)
Glucose, Bld: 91 mg/dL (ref 70–99)
Potassium: 4 mmol/L (ref 3.5–5.1)
Sodium: 139 mmol/L (ref 135–145)

## 2021-11-09 MED ORDER — LOSARTAN POTASSIUM 25 MG PO TABS
25.0000 mg | ORAL_TABLET | Freq: Every day | ORAL | 3 refills | Status: DC
Start: 2021-11-09 — End: 2022-11-08
  Filled 2021-11-09: qty 90, 90d supply, fill #0
  Filled 2022-01-29: qty 90, 90d supply, fill #1
  Filled 2022-05-03: qty 90, 90d supply, fill #2
  Filled 2022-08-09: qty 90, 90d supply, fill #3

## 2021-11-09 MED ORDER — LOSARTAN POTASSIUM 25 MG PO TABS
25.0000 mg | ORAL_TABLET | Freq: Every day | ORAL | 2 refills | Status: DC
  Filled 2021-11-09: qty 90, 90d supply, fill #0

## 2021-11-09 MED ORDER — LOSARTAN POTASSIUM 25 MG PO TABS
25.0000 mg | ORAL_TABLET | Freq: Every day | ORAL | 1 refills | Status: DC
  Filled 2021-11-09: qty 90, 90d supply, fill #0

## 2021-11-09 NOTE — Progress Notes (Signed)
PCP: Geoffry Paradise, MD Cardiology: Dr. Elease Hashimoto HF Cardiology: Dr. Shirlee Latch  88 y.o. with history of chronic atrial fibrillation, diastolic CHF, pulmonary hypertension, [name] was referred by Dr. Elease Hashimoto for evaluation of CHF and pulmonary hypertension. Patient was found to be in atrial fibrillation back in 2017.  At that time, he had a cardioversion.  This did not hold, and he has been in atrial fibrillation since 2017 chronically as far as I can tell. He has had Fe deficiency anemia requiring Fe infusions, GI workup was unrevealing.  Also of note, he has cirrhosis by 9/21 MRI abdomen, cause uncertain (not a heavy drinker).   In the summer of 2022, his legs began to swell significantly and developed weeping sores.  He developed significant dyspnea.  He was seen in the cardiology clinic and started on Lasix.  He cut back on sodium.  Echo in 7/22 showed EF 60-65%, normal RV, PASP 71 mmHg, severe biatrial enlargement, severe TR, probably moderate MR, IVC dilated. Over time, his symptoms haves improved.   TEE in 9/22 showed severe TR (functional) and severe MR (prolapse/partial P3 flail).  LHC/RHC in 9/22 showed minimal CAD, mildly elevated PCWP.     Patient had Mitraclip placed in 10/22 but the device failed the day after with device only attached to one leaflet.  Patient had redo Mitraclip in 11/22, this time it was successful with new clip adjacent to original clip.  Echo in 12/22 showed EF 60-65%, D-shaped septum with moderate RV enlargement and mildly decreased systolic function, PASP 55 mmHg, severe biatrial enlargement, 2 Mitraclips with only 1 attached to both leaflets, mild MR with mean gradient 3 mmHg, severe TR, small iatrogenic secundum ASD, dilated IVC.   Echo in 3/23 showed EF 60-65%, D-shaped septum, mild RV dysfunction with mildly enlarged RV, PASP 50 mmHg, severe biatrial enlargement, mild MR s/p Mitraclip with mean gradient 4.5 mmHg, severe TR.   Patient returns for  followup of CHF and mitral regurgitation.  Weight is stable.  He has finished cardiac rehab.  Feels like Lasix is keeping fluid off.  No lightheadedness.  Does not feel palpitations.  Very active, goes to gym 2-3 x/week, plays tennis, walks dog.  Dyspnea only with heavy exertion.  No orthopnea/PND.    Labs (7/22): pro-BNP 1404 Labs (8/22): hgb 12.8, K 4.2, creatinine 1.11 Labs (9/22): K 4.5, creatinine 1.06 Labs (11/22): K 4.9, creatinine 1.04, hgb 12.1 Labs (12/22): K 4.2, creatinine 1.14, BNP 318 Labs (3/23): K 4.1, creatinine 1.12, BNP 292  PMH: 1. Pulmonary hypertension: Echo in 7/22 with PASP 71 mmHg.  - V/Q scan (8/22): no evidence for chronic PE.  - PFTs (8/22): Minimal airways obstruction - RHC (9/22) showed pulmonary venous hypertension.  2. Fe deficiency anemia: He had extensive GI evaluation in the past that was unrevealing.  Followed by Dr. Darnelle Catalan, has had Fe infusions.  3. Chronic atrial fibrillation: Diagnosed in 2017, failed DCCV in 2017 and appears to have been in atrial fibrillation since then.  4. Cirrhosis: 9/21 abdominal MRI showed cirrhosis.  No history of heavy ETOH, cause uncertain.  5. Chronic diastolic CHF: Echo (7/22) with EF 60-65%, normal RV, PASP 71 mmHg, severe biatrial enlargement, severe TR, probably moderate MR, IVC dilated.  6. Valvular heart disease: Echo in 7/22 with probably moderate MR, severe TR.  Suspect functional due to AF/dilated atria.  - TEE (9/22): EF 55-60%, RV moderately dilated with normal systolic function, moderate LAE, severe RAE, severe TR (functional) with peak RV-RA  gradient 45 mmHg, severe highly eccentric mitral regurgitation with prolapse and partial flail of the P3 segment of the posterior leaflet.   - LHC/RHC (9/22): Nonobstructive mild CAD; mean RA 6, PA 47/15 mean 31, mean PCWP 15 with v-waves to 32, CI 3.32, PVR 2.5 WU.  - Failed Mitraclip in 10/22, successful Mitraclip in 11/22.  - Echo (12/22): EF 60-65%, D-shaped septum with  moderate RV enlargement and mildly decreased systolic function, PASP 55 mmHg, severe biatrial enlargement, 2 Mitraclips with only 1 attached to both leaflets, mild MR with mean gradient 3 mmHg, severe TR, small iatrogenic secundum ASD, dilated IVC.  - Echo (3/23): EF 60-65%, D-shaped septum, mild RV dysfunction with mildly enlarged RV, PASP 50 mmHg, severe biatrial enlargement, mild MR s/p Mitraclip with mean gradient 4.5 mmHg, severe TR.  7. Left renal mass: S/p left nephrectomy.  8. T-spine compression fracture.  9. Melanoma  SH: Widower, occasional ETOH (not heavy), nonsmoker.  Retired Clinical research associate.  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  [employer] [employer] graduate.   Family History  Problem Relation Age of Onset   Breast cancer Mother 56   Hypertension Father    Diabetes Neg Hx    Stroke Neg Hx    CAD Neg Hx    Colon cancer Neg Hx    Colon polyps Neg Hx    Esophageal cancer Neg Hx    Rectal cancer Neg Hx    Stomach cancer Neg Hx    ROS: All systems reviewed and negative except as per HPI.   Current Outpatient Medications  Medication Sig Dispense Refill   ALPRAZolam (XANAX) 0.25 MG tablet Take 1 tablet (0.25 mg total) by mouth every 6 (six) hours as needed for anxiety 30 tablet 0   apixaban (ELIQUIS) 5 MG TABS tablet TAKE 1 TABLET BY MOUTH TWICE DAILY 180 tablet 3   Apoaequorin (PREVAGEN EXTRA STRENGTH PO) Take 1 tablet by mouth daily.     Barberry-Oreg Grape-Goldenseal (BERBERINE COMPLEX PO) Take 2,400 mg by mouth daily.     Chromium 1000 MCG TABS Take 1,000 mcg by mouth daily. chromium picolinate     Coenzyme Q10 (CO Q10) 200 MG CAPS Take 200 mg by mouth daily.     empagliflozin (JARDIANCE) 10 MG TABS tablet Take 1 tablet (10 mg total) by mouth daily before breakfast. 30 tablet 6   ferrous gluconate (FERGON) 324 MG tablet Take 324 mg by mouth daily with breakfast.     furosemide (LASIX) 40 MG tablet Take 1 tablet (40 mg total) by mouth daily. 90 tablet 3   KRILL OIL PO Take 1  capsule by mouth daily at 12 noon.     MAGNESIUM GLYCINATE PO Take 525 mg by mouth in the morning and at bedtime.     Melatonin 10 MG TABS Take 10 mg by mouth at bedtime.     metoprolol succinate (TOPROL-XL) 50 MG 24 hr tablet Take 1 tablet (50 mg total) by mouth 2 (two) times daily. Take with or immediately following a meal 180 tablet 1   Misc Natural Products (ADVANCED JOINT RELIEF PO) Take 1 tablet by mouth 2 (two) times daily.     Multiple Vitamin (MULTIVITAMIN) capsule Take 1 capsule by mouth daily. Mega men 50 plus     Multiple Vitamins-Minerals (ZINC PO) Take by mouth.     NON FORMULARY Take 300 mg by mouth 2 (two) times daily. TRU vitamin  Nigen     omeprazole (PRILOSEC) 20 MG capsule Take  1 capsule (20 mg total) by mouth daily. 90 capsule 3   OVER THE COUNTER MEDICATION Take 2 capsules by mouth daily. Prostacare     OVER THE COUNTER MEDICATION Take 1 capsule by mouth 2 (two) times daily. Uricare Himalaya     OVER THE COUNTER MEDICATION Take 1 tablet by mouth 2 (two) times daily. virectin     OVER THE COUNTER MEDICATION Take 1 tablet by mouth 2 (two) times daily. Liver care     OVER THE COUNTER MEDICATION Take 1 tablet by mouth 2 (two) times daily. Tri Flex     OVER THE COUNTER MEDICATION Take 1 capsule by mouth 2 (two) times daily. conjugated linoleic acid     OVER THE COUNTER MEDICATION Take 1 tablet by mouth daily in the afternoon. CDB Gummies     OVER THE COUNTER MEDICATION Take 1 capsule by mouth daily in the afternoon. Hemp oil gummies     OVER THE COUNTER MEDICATION Take 1 tablet by mouth daily. arthrozene     Polyethyl Glycol-Propyl Glycol (SYSTANE) 0.4-0.3 % SOLN 1 drop daily as needed (as needed for dry eye).     Sodium Hyaluronate, oral, (HYALURONIC ACID) 100 MG CAPS Take 100 mg by mouth 2 (two) times daily.     spironolactone (ALDACTONE) 25 MG tablet Take 0.5 tablets (12.5 mg total) by mouth daily. 90 tablet 3   tadalafil (CIALIS) 20 MG tablet TAKE ONE TABLET BY MOUTH DAILY  AS NEEDED 30 tablet 11   losartan (COZAAR) 25 MG tablet Take 1 tablet (25 mg total) by mouth daily. 90 tablet 3   No current facility-administered medications for this encounter.   BP 110/70   Pulse 76   Wt 73.3 kg (161 lb 9.6 oz)   SpO2 95%   BMI 23.69 kg/m  General: NAD Neck: No JVD, no thyromegaly or thyroid nodule.  Lungs: Clear to auscultation bilaterally with normal respiratory effort. CV: Nondisplaced PMI.  Heart regular S1/S2, no S3/S4, 2/6 HSM LLSB.  No peripheral edema.  No carotid bruit.  Normal pedal pulses.  Abdomen: Soft, nontender, no hepatosplenomegaly, no distention.  Skin: Intact without lesions or rashes.  Neurologic: Alert and oriented x 3.  Psych: Normal affect. Extremities: No clubbing or cyanosis.  HEENT: Normal.   Assessment/Plan: 1. Chronic diastolic CHF: Echo in 7/22 with EF 60-65%, normal RV, PASP 71 mmHg, severe biatrial enlargement, severe TR, probably moderate MR, IVC dilated.  In the summer of 2022, he developed symptomatic CHF, this has improved with diuresis and sodium restriction.  TEE showed severe functional TR and severe primary mitral regurgitation with partial flail posterior leaflet (P3).  I suspect that CHF is due to chronic atrial fibrillation + valvular disease (MR, TR).  He is now s/p Mitraclip, last echo in 12/22 showed EF 60-65%, D-shaped septum with moderate RV enlargement and mildly decreased systolic function, PASP 55 mmHg, severe biatrial enlargement, 2 Mitraclips with only 1 attached to both leaflets, mild MR with mean gradient 3 mmHg, severe TR, small iatrogenic secundum ASD, dilated IVC. Echo in 3/23 showed EF 60-65%, D-shaped septum, mild RV dysfunction with mildly enlarged RV, PASP 50 mmHg, severe biatrial enlargement, mild MR s/p Mitraclip with mean gradient 4.5 mmHg, severe TR.  He describes minimal symptoms currently, NYHA class II at most.  Not volume overloaded.  - Continue Lasix 40 mg daily with BMET today.  - Continue  empagliflozin 10 mg daily.    - Continue spironolactone 12.5 daily.   2. Pulmonary hypertension: Pulmonary  venous hypertension by 9/22 RHC.   3. Atrial fibrillation: This is chronic and likely permanent (present since 2017).  The atria are severely dilated on echo. I do not think that he would be likely to hold NSR with DCCV, even with anti-arrhythmic.  - Continue Eliquis. Check CBC.  - Good rate control on current Toprol XL.  4. Valvular heart disease: He is s/p Mitraclip with 3/23 echo showing mild MR and mild MS, but he still had severe TR.  Symptomatically improved s/p Mitraclip.  With severe TR and RV changes already noted on echo (D-shaped septum, mild RV enlargement and hypokinesis), I am concerned that he could develop progressive RV failure over time.  - We discussed percutaneous TV repair at the [facility] in [place].  He is not markedly symptomatic on diuretics but is at risk for progressive RV failure.  He is interested in pursuing workup for Triclip.  He is going to contact Dr. Art Buff.   Followup in 3 months.   Marca Ancona 11/09/2021

## 2021-11-10 ENCOUNTER — Other Ambulatory Visit (HOSPITAL_COMMUNITY): Payer: Self-pay

## 2021-11-10 ENCOUNTER — Other Ambulatory Visit: Payer: Self-pay | Admitting: Cardiology

## 2021-11-10 ENCOUNTER — Other Ambulatory Visit (HOSPITAL_COMMUNITY): Payer: Self-pay | Admitting: *Deleted

## 2021-11-10 MED ORDER — METOPROLOL SUCCINATE ER 50 MG PO TB24
50.0000 mg | ORAL_TABLET | Freq: Two times a day (BID) | ORAL | 1 refills | Status: DC
  Filled 2021-11-10: qty 180, 90d supply, fill #0
  Filled 2022-01-29: qty 180, 90d supply, fill #1

## 2021-11-22 ENCOUNTER — Encounter: Payer: Self-pay | Admitting: Cardiovascular Disease
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
## 2021-11-23 ENCOUNTER — Ambulatory Visit: Payer: Medicare PPO | Admitting: Cardiovascular Disease

## 2021-11-23 ENCOUNTER — Encounter: Payer: Self-pay | Admitting: Cardiovascular Disease

## 2021-11-23 VITALS — BP 102/64 | HR 67 | Ht 71.0 in | Wt 162.8 lb

## 2021-11-23 DIAGNOSIS — I340 Nonrheumatic mitral (valve) insufficiency: Secondary | ICD-10-CM | POA: Diagnosis not present

## 2021-11-23 DIAGNOSIS — I482 Chronic atrial fibrillation, unspecified: Secondary | ICD-10-CM | POA: Diagnosis not present

## 2021-11-23 DIAGNOSIS — I071 Rheumatic tricuspid insufficiency: Secondary | ICD-10-CM

## 2021-11-23 NOTE — Patient Instructions (Signed)
Medication Instructions:  Your physician recommends that you continue on your current medications as directed. Please refer to the Current Medication list given to you today.  *If you need a refill on your cardiac medications before your next appointment, please call your pharmacy*   Lab Work: NONE If you have labs (blood work) drawn today and your tests are completely normal, you will receive your results only by: [place] (if you have MyChart) OR A paper copy in the mail If you have any lab test that is abnormal or we need to change your treatment, we will call you to review the results.   Testing/Procedures: NONE   Follow-Up: At [facility], you and your health needs are our priority.  As part of our continuing mission to provide you with exceptional heart care, we have created designated Provider Care Teams.  These Care Teams include your primary Cardiologist (physician) and Advanced Practice Providers (APPs -  Physician Assistants and Nurse Practitioners) who all work together to provide you with the care you need, when you need it.  Your next appointment:   Next scheduled appt w/ Aundra Dubin   Important Information About [place]

## 2021-11-25 DIAGNOSIS — I5032 Chronic diastolic (congestive) heart failure: Secondary | ICD-10-CM | POA: Diagnosis not present

## 2021-11-25 DIAGNOSIS — I071 Rheumatic tricuspid insufficiency: Secondary | ICD-10-CM | POA: Diagnosis not present

## 2021-11-26 DIAGNOSIS — I071 Rheumatic tricuspid insufficiency: Secondary | ICD-10-CM | POA: Diagnosis not present

## 2021-11-26 DIAGNOSIS — I5032 Chronic diastolic (congestive) heart failure: Secondary | ICD-10-CM | POA: Diagnosis not present

## 2021-11-26 DIAGNOSIS — I088 Other rheumatic multiple valve diseases: Secondary | ICD-10-CM | POA: Diagnosis not present

## 2021-11-26 DIAGNOSIS — I110 Hypertensive heart disease with heart failure: Secondary | ICD-10-CM | POA: Diagnosis not present

## 2021-11-26 DIAGNOSIS — R931 Abnormal findings on diagnostic imaging of heart and coronary circulation: Secondary | ICD-10-CM | POA: Diagnosis not present

## 2021-11-26 DIAGNOSIS — Z87891 Personal history of nicotine dependence: Secondary | ICD-10-CM | POA: Diagnosis not present

## 2021-11-26 DIAGNOSIS — I272 Pulmonary hypertension, unspecified: Secondary | ICD-10-CM | POA: Diagnosis not present

## 2021-12-02 ENCOUNTER — Other Ambulatory Visit (HOSPITAL_COMMUNITY): Payer: Self-pay

## 2021-12-02 DIAGNOSIS — D225 Melanocytic nevi of trunk: Secondary | ICD-10-CM | POA: Diagnosis not present

## 2021-12-02 DIAGNOSIS — I272 Pulmonary hypertension, unspecified: Secondary | ICD-10-CM | POA: Diagnosis not present

## 2021-12-02 DIAGNOSIS — L570 Actinic keratosis: Secondary | ICD-10-CM | POA: Diagnosis not present

## 2021-12-02 DIAGNOSIS — I482 Chronic atrial fibrillation, unspecified: Secondary | ICD-10-CM | POA: Diagnosis not present

## 2021-12-02 DIAGNOSIS — Z85828 Personal history of other malignant neoplasm of skin: Secondary | ICD-10-CM | POA: Diagnosis not present

## 2021-12-02 DIAGNOSIS — R051 Acute cough: Secondary | ICD-10-CM | POA: Diagnosis not present

## 2021-12-02 DIAGNOSIS — I509 Heart failure, unspecified: Secondary | ICD-10-CM | POA: Diagnosis not present

## 2021-12-02 DIAGNOSIS — L814 Other melanin hyperpigmentation: Secondary | ICD-10-CM | POA: Diagnosis not present

## 2021-12-02 DIAGNOSIS — I10 Essential (primary) hypertension: Secondary | ICD-10-CM | POA: Diagnosis not present

## 2021-12-02 DIAGNOSIS — D692 Other nonthrombocytopenic purpura: Secondary | ICD-10-CM | POA: Diagnosis not present

## 2021-12-02 DIAGNOSIS — L821 Other seborrheic keratosis: Secondary | ICD-10-CM | POA: Diagnosis not present

## 2021-12-02 DIAGNOSIS — D2261 Melanocytic nevi of right upper limb, including shoulder: Secondary | ICD-10-CM | POA: Diagnosis not present

## 2021-12-02 DIAGNOSIS — D2371 Other benign neoplasm of skin of right lower limb, including hip: Secondary | ICD-10-CM | POA: Diagnosis not present

## 2021-12-02 DIAGNOSIS — D1801 Hemangioma of skin and subcutaneous tissue: Secondary | ICD-10-CM | POA: Diagnosis not present

## 2021-12-02 DIAGNOSIS — J069 Acute upper respiratory infection, unspecified: Secondary | ICD-10-CM | POA: Diagnosis not present

## 2021-12-02 DIAGNOSIS — D2262 Melanocytic nevi of left upper limb, including shoulder: Secondary | ICD-10-CM | POA: Diagnosis not present

## 2021-12-02 MED ORDER — CEFDINIR 300 MG PO CAPS
300.0000 mg | ORAL_CAPSULE | Freq: Two times a day (BID) | ORAL | 0 refills | Status: DC
  Filled 2021-12-02: qty 14, 7d supply, fill #0

## 2021-12-09 ENCOUNTER — Other Ambulatory Visit: Payer: Self-pay | Admitting: Physician Assistant

## 2021-12-09 DIAGNOSIS — I340 Nonrheumatic mitral (valve) insufficiency: Secondary | ICD-10-CM

## 2021-12-09 DIAGNOSIS — Z9889 Other specified postprocedural states: Secondary | ICD-10-CM

## 2021-12-10 ENCOUNTER — Other Ambulatory Visit (HOSPITAL_COMMUNITY): Payer: Self-pay

## 2021-12-14 DIAGNOSIS — E785 Hyperlipidemia, unspecified: Secondary | ICD-10-CM | POA: Diagnosis not present

## 2021-12-14 DIAGNOSIS — I10 Essential (primary) hypertension: Secondary | ICD-10-CM | POA: Diagnosis not present

## 2021-12-18 ENCOUNTER — Telehealth: Payer: Self-pay

## 2021-12-18 NOTE — Telephone Encounter (Signed)
Confirmed with the patient his new date/time for echo and office visit with Dr. Aundra Dubin on 02/19/2022 with arrival time of 0845. He was grateful for call and agrees with plan.

## 2021-12-18 NOTE — Telephone Encounter (Signed)
-----   Message from Claudia Pollock sent at 12/18/2021 11:36 AM EDT ----- Regarding: RE: change apt date YW ----- Message ----- From: Theodoro Parma, RN Sent: 12/18/2021  11:33 AM EDT To: Claudia Pollock Subject: RE: change apt date                            I'll do it! Thank you so much!!  Have a great weekend, [place] ----- Message ----- From: Claudia Pollock Sent: 12/18/2021  11:22 AM EDT To: Theodoro Parma, RN Subject: RE: change apt date                            10/6 echo @ 9 appt @ 58. Do I need to contact pt? ----- Message ----- From: Theodoro Parma, RN Sent: 12/18/2021  10:43 AM EDT To: Claudia Pollock Subject: FW: change apt date                            Hey! Can you help with this? I think Nira Conn is out.  Thank you!! ----- Message ----- From: Eileen Stanford, PA-C Sent: 12/10/2021  12:22 PM EDT To: Larey Dresser, MD; Scarlette Calico, RN; # Subject: change apt date                                Pt has an apt with DM on 9/19. I called him to let him know I placed an echocardiogram to for 1 year eval s/p Clip before that apt and he said that he will be out of town then and wont be back until 10/1. Is there anyway we can move both the DM apt and the echo to sometime in October? Maybe we could move them a little closer together too!. Thank you!!! KT

## 2022-01-05 DIAGNOSIS — I4891 Unspecified atrial fibrillation: Secondary | ICD-10-CM | POA: Diagnosis not present

## 2022-01-05 DIAGNOSIS — Z95818 Presence of other cardiac implants and grafts: Secondary | ICD-10-CM | POA: Diagnosis not present

## 2022-01-05 DIAGNOSIS — I361 Nonrheumatic tricuspid (valve) insufficiency: Secondary | ICD-10-CM | POA: Diagnosis not present

## 2022-01-05 DIAGNOSIS — Z79899 Other long term (current) drug therapy: Secondary | ICD-10-CM | POA: Diagnosis not present

## 2022-01-05 DIAGNOSIS — Z7901 Long term (current) use of anticoagulants: Secondary | ICD-10-CM | POA: Diagnosis not present

## 2022-01-05 DIAGNOSIS — I5032 Chronic diastolic (congestive) heart failure: Secondary | ICD-10-CM | POA: Diagnosis not present

## 2022-01-06 DIAGNOSIS — N5201 Erectile dysfunction due to arterial insufficiency: Secondary | ICD-10-CM | POA: Diagnosis not present

## 2022-01-06 DIAGNOSIS — D49511 Neoplasm of unspecified behavior of right kidney: Secondary | ICD-10-CM | POA: Diagnosis not present

## 2022-01-06 DIAGNOSIS — Z905 Acquired absence of kidney: Secondary | ICD-10-CM | POA: Diagnosis not present

## 2022-01-07 DIAGNOSIS — I4891 Unspecified atrial fibrillation: Secondary | ICD-10-CM | POA: Diagnosis not present

## 2022-01-07 DIAGNOSIS — I482 Chronic atrial fibrillation, unspecified: Secondary | ICD-10-CM | POA: Diagnosis not present

## 2022-01-07 DIAGNOSIS — R9431 Abnormal electrocardiogram [ECG] [EKG]: Secondary | ICD-10-CM | POA: Diagnosis not present

## 2022-01-07 DIAGNOSIS — I361 Nonrheumatic tricuspid (valve) insufficiency: Secondary | ICD-10-CM | POA: Diagnosis not present

## 2022-01-07 DIAGNOSIS — I071 Rheumatic tricuspid insufficiency: Secondary | ICD-10-CM | POA: Diagnosis not present

## 2022-01-07 DIAGNOSIS — I5032 Chronic diastolic (congestive) heart failure: Secondary | ICD-10-CM | POA: Diagnosis not present

## 2022-01-07 DIAGNOSIS — I4510 Unspecified right bundle-branch block: Secondary | ICD-10-CM | POA: Diagnosis not present

## 2022-01-07 DIAGNOSIS — I272 Pulmonary hypertension, unspecified: Secondary | ICD-10-CM | POA: Diagnosis not present

## 2022-01-08 ENCOUNTER — Other Ambulatory Visit (HOSPITAL_COMMUNITY): Payer: Self-pay

## 2022-01-11 ENCOUNTER — Other Ambulatory Visit (HOSPITAL_COMMUNITY): Payer: Self-pay

## 2022-01-27 DIAGNOSIS — Z01810 Encounter for preprocedural cardiovascular examination: Secondary | ICD-10-CM | POA: Diagnosis not present

## 2022-01-28 DIAGNOSIS — I5032 Chronic diastolic (congestive) heart failure: Secondary | ICD-10-CM | POA: Diagnosis not present

## 2022-01-28 DIAGNOSIS — Z79899 Other long term (current) drug therapy: Secondary | ICD-10-CM | POA: Diagnosis not present

## 2022-01-28 DIAGNOSIS — Z7984 Long term (current) use of oral hypoglycemic drugs: Secondary | ICD-10-CM | POA: Diagnosis not present

## 2022-01-28 DIAGNOSIS — I361 Nonrheumatic tricuspid (valve) insufficiency: Secondary | ICD-10-CM | POA: Diagnosis not present

## 2022-01-28 DIAGNOSIS — Z7901 Long term (current) use of anticoagulants: Secondary | ICD-10-CM | POA: Diagnosis not present

## 2022-01-28 DIAGNOSIS — Z95818 Presence of other cardiac implants and grafts: Secondary | ICD-10-CM | POA: Diagnosis not present

## 2022-01-28 DIAGNOSIS — I4821 Permanent atrial fibrillation: Secondary | ICD-10-CM | POA: Diagnosis not present

## 2022-01-28 DIAGNOSIS — I4891 Unspecified atrial fibrillation: Secondary | ICD-10-CM | POA: Diagnosis not present

## 2022-01-29 ENCOUNTER — Other Ambulatory Visit (HOSPITAL_COMMUNITY): Payer: Self-pay

## 2022-02-01 ENCOUNTER — Other Ambulatory Visit (HOSPITAL_COMMUNITY): Payer: Self-pay

## 2022-02-02 ENCOUNTER — Other Ambulatory Visit (HOSPITAL_COMMUNITY): Payer: Medicare PPO

## 2022-02-02 ENCOUNTER — Encounter (HOSPITAL_COMMUNITY): Payer: Medicare PPO | Admitting: Cardiology

## 2022-02-09 ENCOUNTER — Other Ambulatory Visit (HOSPITAL_COMMUNITY): Payer: Self-pay

## 2022-02-10 DIAGNOSIS — I482 Chronic atrial fibrillation, unspecified: Secondary | ICD-10-CM | POA: Diagnosis not present

## 2022-02-10 DIAGNOSIS — I2722 Pulmonary hypertension due to left heart disease: Secondary | ICD-10-CM | POA: Diagnosis not present

## 2022-02-10 DIAGNOSIS — N2889 Other specified disorders of kidney and ureter: Secondary | ICD-10-CM | POA: Diagnosis not present

## 2022-02-10 DIAGNOSIS — Z006 Encounter for examination for normal comparison and control in clinical research program: Secondary | ICD-10-CM | POA: Diagnosis not present

## 2022-02-10 DIAGNOSIS — I081 Rheumatic disorders of both mitral and tricuspid valves: Secondary | ICD-10-CM | POA: Diagnosis not present

## 2022-02-10 DIAGNOSIS — I4821 Permanent atrial fibrillation: Secondary | ICD-10-CM | POA: Diagnosis not present

## 2022-02-10 DIAGNOSIS — I5032 Chronic diastolic (congestive) heart failure: Secondary | ICD-10-CM | POA: Diagnosis not present

## 2022-02-10 DIAGNOSIS — Z7901 Long term (current) use of anticoagulants: Secondary | ICD-10-CM | POA: Diagnosis not present

## 2022-02-10 DIAGNOSIS — K769 Liver disease, unspecified: Secondary | ICD-10-CM | POA: Diagnosis not present

## 2022-02-10 DIAGNOSIS — I4891 Unspecified atrial fibrillation: Secondary | ICD-10-CM | POA: Diagnosis not present

## 2022-02-10 DIAGNOSIS — I340 Nonrheumatic mitral (valve) insufficiency: Secondary | ICD-10-CM | POA: Diagnosis not present

## 2022-02-10 DIAGNOSIS — Z905 Acquired absence of kidney: Secondary | ICD-10-CM | POA: Diagnosis not present

## 2022-02-10 DIAGNOSIS — I272 Pulmonary hypertension, unspecified: Secondary | ICD-10-CM | POA: Diagnosis not present

## 2022-02-10 DIAGNOSIS — I361 Nonrheumatic tricuspid (valve) insufficiency: Secondary | ICD-10-CM | POA: Diagnosis not present

## 2022-02-10 DIAGNOSIS — I071 Rheumatic tricuspid insufficiency: Secondary | ICD-10-CM | POA: Diagnosis not present

## 2022-02-13 ENCOUNTER — Encounter (HOSPITAL_COMMUNITY): Payer: Self-pay | Admitting: Cardiology

## 2022-02-18 DIAGNOSIS — I071 Rheumatic tricuspid insufficiency: Secondary | ICD-10-CM | POA: Diagnosis not present

## 2022-02-18 DIAGNOSIS — I482 Chronic atrial fibrillation, unspecified: Secondary | ICD-10-CM | POA: Diagnosis not present

## 2022-02-18 DIAGNOSIS — I5032 Chronic diastolic (congestive) heart failure: Secondary | ICD-10-CM | POA: Diagnosis not present

## 2022-02-19 ENCOUNTER — Ambulatory Visit (HOSPITAL_COMMUNITY)
Admission: RE | Admit: 2022-02-19 | Discharge: 2022-02-19 | Disposition: A | Discharge status: Home / Self Care | Payer: Medicare PPO | Source: Ambulatory Visit | Attending: Cardiology | Admitting: Cardiology

## 2022-02-19 ENCOUNTER — Ambulatory Visit (HOSPITAL_COMMUNITY): Payer: Medicare PPO

## 2022-02-19 VITALS — BP 110/80 | HR 71 | Wt 163.0 lb

## 2022-02-19 DIAGNOSIS — Z905 Acquired absence of kidney: Secondary | ICD-10-CM | POA: Insufficient documentation

## 2022-02-19 DIAGNOSIS — Z7901 Long term (current) use of anticoagulants: Secondary | ICD-10-CM | POA: Diagnosis not present

## 2022-02-19 DIAGNOSIS — Z79899 Other long term (current) drug therapy: Secondary | ICD-10-CM | POA: Insufficient documentation

## 2022-02-19 DIAGNOSIS — I272 Pulmonary hypertension, unspecified: Secondary | ICD-10-CM | POA: Insufficient documentation

## 2022-02-19 DIAGNOSIS — I340 Nonrheumatic mitral (valve) insufficiency: Secondary | ICD-10-CM | POA: Diagnosis not present

## 2022-02-19 DIAGNOSIS — Z8249 Family history of ischemic heart disease and other diseases of the circulatory system: Secondary | ICD-10-CM | POA: Diagnosis not present

## 2022-02-19 DIAGNOSIS — Z7984 Long term (current) use of oral hypoglycemic drugs: Secondary | ICD-10-CM | POA: Diagnosis not present

## 2022-02-19 DIAGNOSIS — I071 Rheumatic tricuspid insufficiency: Secondary | ICD-10-CM

## 2022-02-19 DIAGNOSIS — I482 Chronic atrial fibrillation, unspecified: Secondary | ICD-10-CM | POA: Diagnosis not present

## 2022-02-19 DIAGNOSIS — I5032 Chronic diastolic (congestive) heart failure: Secondary | ICD-10-CM | POA: Diagnosis not present

## 2022-02-19 DIAGNOSIS — I2510 Atherosclerotic heart disease of native coronary artery without angina pectoris: Secondary | ICD-10-CM | POA: Insufficient documentation

## 2022-02-19 DIAGNOSIS — K7460 Unspecified cirrhosis of liver: Secondary | ICD-10-CM | POA: Diagnosis not present

## 2022-02-19 DIAGNOSIS — I081 Rheumatic disorders of both mitral and tricuspid valves: Secondary | ICD-10-CM | POA: Insufficient documentation

## 2022-02-19 LAB — BASIC METABOLIC PANEL
Anion gap: 11 (ref 5–15)
BUN: 20 mg/dL (ref 8–23)
CO2: 28 mmol/L (ref 22–32)
Calcium: 9.7 mg/dL (ref 8.9–10.3)
Chloride: 101 mmol/L (ref 98–111)
Creatinine, Ser: 1.18 mg/dL (ref 0.61–1.24)
GFR, Estimated: 59 mL/min — ABNORMAL LOW (ref >60)
Glucose, Bld: 90 mg/dL (ref 70–99)
Potassium: 4.2 mmol/L (ref 3.5–5.1)
Sodium: 140 mmol/L (ref 135–145)

## 2022-02-19 LAB — CBC
HCT: 46.2 % (ref 39.0–52.0)
Hemoglobin: 15.5 g/dL (ref 13.0–17.0)
MCH: 32.2 pg (ref 26.0–34.0)
MCHC: 33.5 g/dL (ref 30.0–36.0)
MCV: 95.9 fL (ref 80.0–100.0)
Platelets: 132 10*3/uL — ABNORMAL LOW (ref 150–400)
RBC: 4.82 MIL/uL (ref 4.22–5.81)
RDW: 13.2 % (ref 11.5–15.5)
WBC: 3.4 10*3/uL — ABNORMAL LOW (ref 4.0–10.5)
nRBC: 0 % (ref 0.0–0.2)

## 2022-02-19 LAB — BRAIN NATRIURETIC PEPTIDE: B Natriuretic Peptide: 360.3 pg/mL — ABNORMAL HIGH (ref 0.0–100.0)

## 2022-02-19 NOTE — Patient Instructions (Signed)
There has been no changes to your medications.  Labs done today, your results will be available in MyChart, we will contact you for abnormal readings.  Your physician recommends that you schedule a follow-up appointment in: 3 months ( January 2024)  ** please call the office November to arrange your follow up appointment **  If you have any questions or concerns before your next appointment please send [place] a message through [place] or call our office at [phone].    TO LEAVE A MESSAGE FOR THE NURSE SELECT OPTION 2, PLEASE LEAVE A MESSAGE INCLUDING: YOUR NAME DATE OF BIRTH CALL BACK NUMBER REASON FOR CALL**this is important as we prioritize the call backs  YOU WILL RECEIVE A CALL BACK THE SAME DAY AS LONG AS YOU CALL BEFORE 4:00 PM  At the [facility], you and your health needs are our priority. As part of our continuing mission to provide you with exceptional heart care, we have created designated Provider Care Teams. These Care Teams include your primary Cardiologist (physician) and Advanced Practice Providers (APPs- Physician Assistants and Nurse Practitioners) who all work together to provide you with the care you need, when you need it.   You may see any of the following providers on your designated Care Team at your next follow up: Dr Glori Bickers Dr Loralie Champagne Dr. Roxana Hires, NP Lyda Jester, [place] [facility] Susan Moore, [place] Forestine Na, NP Audry Riles, PharmD   Please be sure to bring in all your medications bottles to every appointment.

## 2022-02-21 NOTE — Progress Notes (Signed)
PCP: Burnard Bunting, MD Cardiology: Dr. Acie Fredrickson HF Cardiology: Dr. Aundra Dubin  88 y.o. with history of chronic atrial fibrillation, diastolic CHF, pulmonary hypertension, [name] was referred by Dr. Acie Fredrickson for evaluation of CHF and pulmonary hypertension. Patient was found to be in atrial fibrillation back in 2017.  At that time, he had a cardioversion.  This did not hold, and he has been in atrial fibrillation since 2017 chronically as far as I can tell. He has had Fe deficiency anemia requiring Fe infusions, GI workup was unrevealing.  Also of note, he has cirrhosis by 9/21 MRI abdomen, cause uncertain (not a heavy drinker).   In the summer of 2022, his legs began to swell significantly and developed weeping sores.  He developed significant dyspnea.  He was seen in the cardiology clinic and started on Lasix.  He cut back on sodium.  Echo in 7/22 showed EF 60-65%, normal RV, PASP 71 mmHg, severe biatrial enlargement, severe TR, probably moderate MR, IVC dilated. Over time, his symptoms haves improved.   TEE in 9/22 showed severe TR (functional) and severe MR (prolapse/partial P3 flail).  LHC/RHC in 9/22 showed minimal CAD, mildly elevated PCWP.     Patient had Mitraclip placed in 10/22 but the device failed the day after with device only attached to one leaflet.  Patient had redo Mitraclip in 11/22, this time it was successful with new clip adjacent to original clip.  Echo in 12/22 showed EF 60-65%, D-shaped septum with moderate RV enlargement and mildly decreased systolic function, PASP 55 mmHg, severe biatrial enlargement, 2 Mitraclips with only 1 attached to both leaflets, mild MR with mean gradient 3 mmHg, severe TR, small iatrogenic secundum ASD, dilated IVC.   Echo in 3/23 showed EF 60-65%, D-shaped septum, mild RV dysfunction with mildly enlarged RV, PASP 50 mmHg, severe biatrial enlargement, mild MR s/p Mitraclip with mean gradient 4.5 mmHg, severe TR.   Patient had tTEER as part  of Triluminate study on 02/10/22.  2 clips were placed . Post-procedure echo in 9/23 showed EF 66%, severe RV dilation with low normal systolic function, 2 Mitraclips with mild-moderate MR, 2 Triclips with moderate TR, PASP 63 mmHg.   Patient returns for followup of CHF and mitral regurgitation/tricuspid regurgitation.  Weight is stable.  He is doing well symptomatically.  Still playing tennis.  Walks 1 mile 2-3 times/week.  Good energy level, no significant exertional dyspnea.  No lightheadedness.  No chest pain.   Labs (7/22): pro-BNP 1404 Labs (8/22): hgb 12.8, K 4.2, creatinine 1.11 Labs (9/22): K 4.5, creatinine 1.06 Labs (11/22): K 4.9, creatinine 1.04, hgb 12.1 Labs (12/22): K 4.2, creatinine 1.14, BNP 318 Labs (3/23): K 4.1, creatinine 1.12, BNP 292 Labs (9/23): K 4.3, creatinine 1.13  ECG (personally reviewed): atrial fibrillation, IVCD 122 msec  PMH: 1. Pulmonary hypertension: Echo in 7/22 with PASP 71 mmHg.  - V/Q scan (8/22): no evidence for chronic PE.  - PFTs (8/22): Minimal airways obstruction - RHC (9/22) showed pulmonary venous hypertension.  2. Fe deficiency anemia: He had extensive GI evaluation in the past that was unrevealing.  Followed by Dr. Jana Hakim, has had Fe infusions.  3. Chronic atrial fibrillation: Diagnosed in 2017, failed DCCV in 2017 and appears to have been in atrial fibrillation since then.  4. Cirrhosis: 9/21 abdominal MRI showed cirrhosis.  No history of heavy ETOH, cause uncertain.  5. Chronic diastolic CHF: Echo (8/93) with EF 60-65%, normal RV, PASP 71 mmHg, severe biatrial enlargement, severe TR,  probably moderate MR, IVC dilated.  6. Valvular heart disease: Echo in 7/22 with probably moderate MR, severe TR.  Suspect functional due to AF/dilated atria.  - TEE (9/22): EF 55-60%, RV moderately dilated with normal systolic function, moderate LAE, severe RAE, severe TR (functional) with peak RV-RA gradient 45 mmHg, severe highly eccentric mitral  regurgitation with prolapse and partial flail of the P3 segment of the posterior leaflet.   - LHC/RHC (9/22): Nonobstructive mild CAD; mean RA 6, PA 47/15 mean 31, mean PCWP 15 with v-waves to 32, CI 3.32, PVR 2.5 WU.  - Failed Mitraclip in 10/22, successful Mitraclip in 11/22.  - Echo (12/22): EF 60-65%, D-shaped septum with moderate RV enlargement and mildly decreased systolic function, PASP 55 mmHg, severe biatrial enlargement, 2 Mitraclips with only 1 attached to both leaflets, mild MR with mean gradient 3 mmHg, severe TR, small iatrogenic secundum ASD, dilated IVC.  - Echo (3/23): EF 60-65%, D-shaped septum, mild RV dysfunction with mildly enlarged RV, PASP 50 mmHg, severe biatrial enlargement, mild MR s/p Mitraclip with mean gradient 4.5 mmHg, severe TR.  - tTEER as part of Triluminate study in 9/23.  - Post-tTEER echo in 9/23: EF 66%, severe RV dilation with low normal systolic function, 2 Mitraclips with mild-moderate MR, 2 Triclips with moderate TR, PASP 63 mmHg.  7. Left renal mass: S/p left nephrectomy.  8. T-spine compression fracture.  9. Melanoma  SH: Widower, occasional ETOH (not heavy), nonsmoker.  Retired Chief Executive Officer.  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  [employer] graduate.   Family History  Problem Relation Age of Onset   Breast cancer Mother 23   Hypertension Father    Diabetes Neg Hx    Stroke Neg Hx    CAD Neg Hx    Colon cancer Neg Hx    Colon polyps Neg Hx    Esophageal cancer Neg Hx    Rectal cancer Neg Hx    Stomach cancer Neg Hx    ROS: All systems reviewed and negative except as per HPI.   Current Outpatient Medications  Medication Sig Dispense Refill   ALPRAZolam (XANAX) 0.25 MG tablet Take 1 tablet (0.25 mg total) by mouth every 6 (six) hours as needed for anxiety 30 tablet 0   apixaban (ELIQUIS) 5 MG TABS tablet TAKE 1 TABLET BY MOUTH TWICE DAILY 180 tablet 3   Apoaequorin (PREVAGEN EXTRA STRENGTH PO) Take 1 tablet by mouth daily.      Barberry-Oreg Grape-Goldenseal (BERBERINE COMPLEX PO) Take 2,400 mg by mouth daily.     Cholecalciferol 50 MCG (2000 UT) CAPS take one capsule Orally daily     Chromium 1000 MCG TABS Take 1,000 mcg by mouth daily. chromium picolinate     Coenzyme Q10 (CO Q10) 200 MG CAPS Take 200 mg by mouth daily.     empagliflozin (JARDIANCE) 10 MG TABS tablet Take 1 tablet (10 mg total) by mouth daily before breakfast. 30 tablet 6   ferrous gluconate (FERGON) 324 MG tablet Take 324 mg by mouth daily with breakfast.     furosemide (LASIX) 40 MG tablet Take 1 tablet (40 mg total) by mouth daily. 90 tablet 3   KRILL OIL PO Take 1 capsule by mouth daily at 12 noon.     losartan (COZAAR) 25 MG tablet Take 1 tablet (25 mg total) by mouth daily. 90 tablet 3   MAGNESIUM GLYCINATE PO Take 525 mg by mouth in the morning and at bedtime.  Melatonin 10 MG TABS Take 10 mg by mouth at bedtime.     metoprolol succinate (TOPROL-XL) 50 MG 24 hr tablet Take 1 tablet (50 mg total) by mouth 2 (two) times daily. Take with or immediately following a meal 180 tablet 1   Misc Natural Products (ADVANCED JOINT RELIEF PO) Take 1 tablet by mouth 2 (two) times daily.     Multiple Vitamin (MULTIVITAMIN) capsule Take 1 capsule by mouth daily. Mega men 50 plus     Multiple Vitamins-Minerals (ZINC PO) Take by mouth.     NON FORMULARY Take 300 mg by mouth 2 (two) times daily. TRU vitamin  Nigen     omeprazole (PRILOSEC) 20 MG capsule Take 1 capsule (20 mg total) by mouth daily. 90 capsule 3   OVER THE COUNTER MEDICATION Take 2 capsules by mouth daily. Prostacare     OVER THE COUNTER MEDICATION Take 1 capsule by mouth 2 (two) times daily. Uricare Himalaya     OVER THE COUNTER MEDICATION Take 1 tablet by mouth 2 (two) times daily. virectin     OVER THE COUNTER MEDICATION Take 1 tablet by mouth 2 (two) times daily. Liver care     OVER THE COUNTER MEDICATION Take 1 tablet by mouth 2 (two) times daily. Tri Flex     OVER THE COUNTER MEDICATION  Take 1 capsule by mouth 2 (two) times daily. conjugated linoleic acid     OVER THE COUNTER MEDICATION Take 1 tablet by mouth daily in the afternoon. CDB Gummies     OVER THE COUNTER MEDICATION Take 1 capsule by mouth daily in the afternoon. Hemp oil gummies     OVER THE COUNTER MEDICATION Take 1 tablet by mouth daily. arthrozene     Polyethyl Glycol-Propyl Glycol (SYSTANE) 0.4-0.3 % SOLN 1 drop daily as needed (as needed for dry eye).     Sodium Hyaluronate, oral, (HYALURONIC ACID) 100 MG CAPS Take 100 mg by mouth 2 (two) times daily.     spironolactone (ALDACTONE) 25 MG tablet Take 0.5 tablets (12.5 mg total) by mouth daily. 90 tablet 3   tadalafil (CIALIS) 20 MG tablet TAKE ONE TABLET BY MOUTH DAILY AS NEEDED 30 tablet 11   No current facility-administered medications for this encounter.   BP 110/80   Pulse 71   Wt 73.9 kg (163 lb)   SpO2 98%   BMI 22.73 kg/m  General: NAD Neck: No JVD, no thyromegaly or thyroid nodule.  Lungs: Clear to auscultation bilaterally with normal respiratory effort. CV: Nondisplaced PMI.  Heart regular S1/S2, no S3/S4, 1/6 HSM apex.  No peripheral edema.  No carotid bruit.  Normal pedal pulses.  Abdomen: Soft, nontender, no hepatosplenomegaly, no distention.  Skin: Intact without lesions or rashes.  Neurologic: Alert and oriented x 3.  Psych: Normal affect. Extremities: No clubbing or cyanosis.  HEENT: Normal.   Assessment/Plan: 1. Chronic diastolic CHF: Echo in 7/90 with EF 60-65%, normal RV, PASP 71 mmHg, severe biatrial enlargement, severe TR, probably moderate MR, IVC dilated.  In the summer of 2022, he developed symptomatic CHF, this has improved with diuresis and sodium restriction.  TEE showed severe functional TR and severe primary mitral regurgitation with partial flail posterior leaflet (P3).  I suspect that CHF is due to chronic atrial fibrillation + valvular disease (MR, TR).  He is now s/p Mitraclip, last echo in 12/22 showed EF 60-65%, D-shaped  septum with moderate RV enlargement and mildly decreased systolic function, PASP 55 mmHg, severe biatrial enlargement, 2 Mitraclips  with only 1 attached to both leaflets, mild MR with mean gradient 3 mmHg, severe TR, small iatrogenic secundum ASD, dilated IVC. Echo in 3/23 showed EF 60-65%, D-shaped septum, mild RV dysfunction with mildly enlarged RV, PASP 50 mmHg, severe biatrial enlargement, mild MR s/p Mitraclip with mean gradient 4.5 mmHg, severe TR.  Patient then had tTEER in 9/23. Post-op echo showed EF 66%, severe RV dilation with low normal systolic function, 2 Mitraclips with mild-moderate MR, 2 Triclips with moderate TR, PASP 63 mmHg.  He describes minimal symptoms currently, NYHA class II at most.  Not volume overloaded on exam.  - Continue Lasix 40 mg daily with BMET/BNP today.  - Continue empagliflozin 10 mg daily.    - Continue spironolactone 12.5 daily.   2. Pulmonary hypertension: Pulmonary venous hypertension by 9/22 RHC.  PASP was 63 on post-tTEER echo, will need to follow this over time.  Hopefully will improve, but he could have some residual PH due to pulmonary vascular remodeling in the setting of vascular disease.  3. Atrial fibrillation: This is chronic and likely permanent (present since 2017).  The atria are severely dilated on echo. I do not think that he would be likely to hold NSR with DCCV, even with anti-arrhythmic.  - Continue Eliquis. Check CBC today.  - Good rate control on current Toprol XL.  4. Valvular heart disease: He is s/p Mitraclip with 3/23 echo showing mild MR and mild MS, but he still had severe TR.  Symptomatically improved s/p Mitraclip.  With severe TR and RV changes already noted on echo (D-shaped septum, mild RV enlargement and hypokinesis), I was concerned that he could develop progressive RV failure over time. He then had tTEER with 2 Triclips in 9/23 as part of Triluminate trial.   - He will get echo on 10/31 to follow Triclip placement.   Followup in 3  months.   Loralie Champagne 02/21/2022

## 2022-02-23 NOTE — Addendum Note (Signed)
Encounter addended by: Theodoro Parma, RN on: 02/23/2022 4:41 PM  Actions taken: Flowsheet accepted

## 2022-03-01 NOTE — Progress Notes (Unsigned)
Synopsis: Referred for asthma by Burnard Bunting, MD  Subjective:   PATIENT ID: John Holder GENDER: male DOB: 08/03/1931, MRN: [id number]  No chief complaint on file.  88yM with history of IDA, never smoker, ?asthma, persistent AF, mitral regurgitation, pulmonary hypertension (V/Q 8/18 negative, PFTs with normal DLCO but do show mild obstruction [post BD FEV1 of 94% predicted], TTE indeterminate for diastolic dysfunction, moderate MR, severe TR, has had severe left and right atrial dilation), GERD, left renal mass s/p nephrectomy who is referred for possible asthma and PH.   He says he only has DOE when he has anemia - this has been an issue for about 3 years. Unclear source of blood loss. He says when his hemoglobin is normal he has no activity limitation at all due to dyspnea. Plays tennis, walks his dog. Only time he notices dyspnea when his anemia is corrected is when he goes up a super steep, sustained incline. With Hb down to 10 he has to stop playing tennis, can't walk his usual route.   He has no cough, CP, orthopnea at all. Never had asthma as a kid. He has never smoked or vaped although he has been around second hand smoke for most of his life.    He snores mildly. He has no excessive daytime sleepiness. He has no PND.   Some knee pain but insignificant morning stiffness, no rashes.  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 Never took diet pills, ADHD medication. Smoked 1 pipe/day tobacco for 20 years, quit when he was 88yo.  Interval HPI:  Had redo mitraclip since last visit, tricuspid clip as well through study in [place]. He has noticed improvement in DOE (which was by his report relatively mild to begin with). No cough, unintentional weight loss, drenching night sweats, fever. No family history of lung cancer. On review of CTA Chest  from 2020 he did have 1cm or so nodular focus of scar/atelectasis in lingula.   CT coronaries 9/14 with 1.2cm nodule in lingula but I cannot review these images in [place] as of the day of the visit and he did not bring disc.   Otherwise pertinent review of systems is negative.  Past Medical History:  Diagnosis Date   Blood transfusion without reported diagnosis    Cataract    removed in replace bilateral   CHF (congestive heart failure) (HCC)    Dysrhythmia    GERD (gastroesophageal reflux disease)    Hypertension    Iron deficiency anemia    amdx 08/2018 >> Tx with PRBCs and Feraheme started; no GI source per workup; followed by Heme (Dr. Jana Hakim) and GI (Dr. Cristina Gong)    Left renal mass    s/p L nephrectomy   Mitral regurgitation    Echo 02/2016:  Mild conc LVH, EF 55-60, mild to mod MR, mod LAE, mod to severe RAE, mod TR, PASP 41 // Echo 10/2018: EF 60-65, mod conc LVH, severe BAE, trivial eff, mild MVP with mod MR, mod to severe TR, asc aorta 36 (mild dilation)    Persistent Atrial Fibrillation    Pulmonary hypertension ([place])    Echocardiogram 7/22: EF 60-65, no RWMA, normal RVSF, RVSP 70.7 (severe pulmonary hypertension), severe BAE, mod MR, severe TR, mild to mod AV sclerosis w/o AS   S/P mitral valve clip implantation  02/26/2021   G4 NT W device; A3/P3 with Dr. Burt Knack     Family History  Problem Relation Age of Onset   Breast cancer Mother 60   Hypertension Father    Diabetes Neg Hx    Stroke Neg Hx    CAD Neg Hx    Colon cancer Neg Hx    Colon polyps Neg Hx    Esophageal cancer Neg Hx    Rectal cancer Neg Hx    Stomach cancer Neg Hx      Past Surgical History:  Procedure Laterality Date   CARDIAC CATHETERIZATION     CARDIOVERSION N/A 02/04/2016   Procedure: CARDIOVERSION;  Surgeon: Thayer Headings, MD;  Location: [facility] ENDOSCOPY;  Service: Cardiovascular;  Laterality: N/A;   CHOLECYSTECTOMY     COLONOSCOPY     COLONOSCOPY WITH PROPOFOL N/A 09/10/2018   Procedure:  COLONOSCOPY WITH PROPOFOL;  Surgeon: Ronald Lobo, MD;  Location: [facility] ENDOSCOPY;  Service: Endoscopy;  Laterality: N/A;   ESOPHAGOGASTRODUODENOSCOPY (EGD) WITH PROPOFOL N/A 09/10/2018   Procedure: ESOPHAGOGASTRODUODENOSCOPY (EGD) WITH PROPOFOL;  Surgeon: Ronald Lobo, MD;  Location: [facility] ENDOSCOPY;  Service: Endoscopy;  Laterality: N/A;   EYE SURGERY     GIVENS CAPSULE STUDY N/A 09/10/2018   Procedure: GIVENS CAPSULE STUDY;  Surgeon: Ronald Lobo, MD;  Location: [facility] ENDOSCOPY;  Service: Endoscopy;  Laterality: N/A;   MITRAL VALVE REPAIR N/A 02/26/2021   Procedure: MITRAL VALVE REPAIR;  Surgeon: [name], MD;  Location: [place] CV LAB;  Service: Cardiovascular;  Laterality: N/A;   MITRAL VALVE REPAIR N/A 03/19/2021   Procedure: MITRAL VALVE REPAIR;  Surgeon: [name], MD;  Location: [place] CV LAB;  Service: Cardiovascular;  Laterality: N/A;   NEPHRECTOMY Left 2002   RIGHT/LEFT HEART CATH AND CORONARY ANGIOGRAPHY N/A 01/27/2021   Procedure: RIGHT/LEFT HEART CATH AND CORONARY ANGIOGRAPHY;  Surgeon: Larey Dresser, MD;  Location: [place] CV LAB;  Service: Cardiovascular;  Laterality: N/A;   TEE WITHOUT CARDIOVERSION N/A 01/27/2021   Procedure: TRANSESOPHAGEAL ECHOCARDIOGRAM (TEE);  Surgeon: Larey Dresser, MD;  Location: [facility] ENDOSCOPY;  Service: Cardiovascular;  Laterality: N/A;   TEE WITHOUT CARDIOVERSION N/A 02/26/2021   Procedure: TRANSESOPHAGEAL ECHOCARDIOGRAM (TEE);  Surgeon: [name], MD;  Location: [place] CV LAB;  Service: Cardiovascular;  Laterality: N/A;   TEE WITHOUT CARDIOVERSION N/A 02/27/2021   Procedure: TRANSESOPHAGEAL ECHOCARDIOGRAM (TEE);  Surgeon: Larey Dresser, MD;  Location: [facility] ENDOSCOPY;  Service: Cardiovascular;  Laterality: N/A;   TEE WITHOUT CARDIOVERSION N/A 03/19/2021   Procedure: TRANSESOPHAGEAL ECHOCARDIOGRAM (TEE);  Surgeon: [name], MD;  Location: [place] CV LAB;  Service: Cardiovascular;  Laterality: N/A;    TONSILLECTOMY      Social History   Socioeconomic History   Marital status: Widowed    Spouse name: Not on file   Number of children: 3   Years of education: Not on file   Highest education level: Professional school degree (e.g., MD, DDS, DVM, JD)  Occupational History   Occupation: judge, lawyer, professor    Comment: retired from all 3  Tobacco Use   Smoking status: Never   Smokeless tobacco: Never  Vaping Use   Vaping Use: Never used  Substance and Sexual Activity   Alcohol use: Yes    Alcohol/week: 0.0 standard drinks of alcohol    Comment: occasional   Drug use: No   Sexual activity: Not on file  Other Topics Concern   Not on file  Social History Narrative   Not on file  Social Determinants of Health   Financial Resource Strain: Not on file  Food Insecurity: Not on file  Transportation Needs: Not on file  Physical Activity: Not on file  Stress: Not on file  Social Connections: Not on file  Intimate Partner Violence: Not on file     No Known Allergies   Outpatient Medications Prior to Visit  Medication Sig Dispense Refill   ALPRAZolam (XANAX) 0.25 MG tablet Take 1 tablet (0.25 mg total) by mouth every 6 (six) hours as needed for anxiety 30 tablet 0   apixaban (ELIQUIS) 5 MG TABS tablet TAKE 1 TABLET BY MOUTH TWICE DAILY 180 tablet 3   Apoaequorin (PREVAGEN EXTRA STRENGTH PO) Take 1 tablet by mouth daily.     Barberry-Oreg Grape-Goldenseal (BERBERINE COMPLEX PO) Take 2,400 mg by mouth daily.     Cholecalciferol 50 MCG (2000 UT) CAPS take one capsule Orally daily     Chromium 1000 MCG TABS Take 1,000 mcg by mouth daily. chromium picolinate     Coenzyme Q10 (CO Q10) 200 MG CAPS Take 200 mg by mouth daily.     empagliflozin (JARDIANCE) 10 MG TABS tablet Take 1 tablet (10 mg total) by mouth daily before breakfast. 30 tablet 6   ferrous gluconate (FERGON) 324 MG tablet Take 324 mg by mouth daily with breakfast.     furosemide (LASIX) 40 MG tablet Take 1 tablet  (40 mg total) by mouth daily. 90 tablet 3   KRILL OIL PO Take 1 capsule by mouth daily at 12 noon.     losartan (COZAAR) 25 MG tablet Take 1 tablet (25 mg total) by mouth daily. 90 tablet 3   MAGNESIUM GLYCINATE PO Take 525 mg by mouth in the morning and at bedtime.     Melatonin 10 MG TABS Take 10 mg by mouth at bedtime.     metoprolol succinate (TOPROL-XL) 50 MG 24 hr tablet Take 1 tablet (50 mg total) by mouth 2 (two) times daily. Take with or immediately following a meal 180 tablet 1   Misc Natural Products (ADVANCED JOINT RELIEF PO) Take 1 tablet by mouth 2 (two) times daily.     Multiple Vitamin (MULTIVITAMIN) capsule Take 1 capsule by mouth daily. Mega men 50 plus     Multiple Vitamins-Minerals (ZINC PO) Take by mouth.     NON FORMULARY Take 300 mg by mouth 2 (two) times daily. TRU vitamin  Nigen     omeprazole (PRILOSEC) 20 MG capsule Take 1 capsule (20 mg total) by mouth daily. 90 capsule 3   OVER THE COUNTER MEDICATION Take 2 capsules by mouth daily. Prostacare     OVER THE COUNTER MEDICATION Take 1 capsule by mouth 2 (two) times daily. Uricare Himalaya     OVER THE COUNTER MEDICATION Take 1 tablet by mouth 2 (two) times daily. virectin     OVER THE COUNTER MEDICATION Take 1 tablet by mouth 2 (two) times daily. Liver care     OVER THE COUNTER MEDICATION Take 1 tablet by mouth 2 (two) times daily. Tri Flex     OVER THE COUNTER MEDICATION Take 1 capsule by mouth 2 (two) times daily. conjugated linoleic acid     OVER THE COUNTER MEDICATION Take 1 tablet by mouth daily in the afternoon. CDB Gummies     OVER THE COUNTER MEDICATION Take 1 capsule by mouth daily in the afternoon. Hemp oil gummies     OVER THE COUNTER MEDICATION Take 1 tablet by mouth daily. arthrozene  Polyethyl Glycol-Propyl Glycol (SYSTANE) 0.4-0.3 % SOLN 1 drop daily as needed (as needed for dry eye).     Sodium Hyaluronate, oral, (HYALURONIC ACID) 100 MG CAPS Take 100 mg by mouth 2 (two) times daily.      spironolactone (ALDACTONE) 25 MG tablet Take 0.5 tablets (12.5 mg total) by mouth daily. 90 tablet 3   tadalafil (CIALIS) 20 MG tablet TAKE ONE TABLET BY MOUTH DAILY AS NEEDED 30 tablet 11   No facility-administered medications prior to visit.       Objective:   Physical Exam:  General appearance: 88 y.o., male, NAD, conversant  Eyes: anicteric sclerae; PERRL, tracking appropriately HENT: NCAT; MMM Neck: Trachea midline; no lymphadenopathy, no JVD Lungs: CTAB, no crackles, no wheeze, with normal respiratory effort CV: RRR, no murmur  Abdomen: Soft, non-tender; non-distended, BS present  Extremities: No peripheral edema, warm Skin: Normal turgor and texture; no rash Psych: Appropriate affect Neuro: Alert and oriented to person and place, no focal deficit     There were no vitals filed for this visit.    on RA BMI Readings from Last 3 Encounters:  02/19/22 22.73 kg/m  11/23/21 22.71 kg/m  11/09/21 23.69 kg/m   Wt Readings from Last 3 Encounters:  02/19/22 163 lb (73.9 kg)  11/23/21 162 lb 12.8 oz (73.8 kg)  11/09/21 161 lb 9.6 oz (73.3 kg)     CBC    Component Value Date/Time   WBC 3.4 (L) 02/19/2022 0933   RBC 4.82 02/19/2022 0933   HGB 15.5 02/19/2022 0933   HGB 10.0 (L) 11/26/2020 1312   HCT 46.2 02/19/2022 0933   HCT 31.3 (L) 11/26/2020 1312   PLT 132 (L) 02/19/2022 0933   PLT 159 11/26/2020 1312   MCV 95.9 02/19/2022 0933   MCV 83 11/26/2020 1312   MCH 32.2 02/19/2022 0933   MCHC 33.5 02/19/2022 0933   RDW 13.2 02/19/2022 0933   RDW 15.7 (H) 11/26/2020 1312   LYMPHSABS 0.7 01/08/2021 1501   MONOABS 0.4 01/08/2021 1501   EOSABS 0.2 01/08/2021 1501   BASOSABS 0.0 01/08/2021 1501     Chest Imaging: CXR 12/31/20 reviewed by me and remarkable for borderline enlarged CMS  CT Coronaries 01/28/22 reviewed by me with 1.2cm nodular opacity lingula - images not available in [place]  Pulmonary Functions Testing Results:    Latest Ref Rng & Units  12/29/2020   11:57 AM  PFT Results  FVC-Pre L 3.29   FVC-Predicted Pre % 87   FVC-Post L 3.64   FVC-Predicted Post % 96   Pre FEV1/FVC % % 62   Post FEV1/FCV % % 68   FEV1-Pre L 2.05   FEV1-Predicted Pre % 78   FEV1-Post L 2.47   DLCO uncorrected ml/min/mmHg 19.97   DLCO UNC% % 84   DLCO corrected ml/min/mmHg 23.76   DLCO COR %Predicted % 100   DLVA Predicted % 108   TLC L 6.48   TLC % Predicted % 91   RV % Predicted % 112      Echocardiogram:   TTE 12/12/20: Normal EF, indeterminate for diastolic dysfunction, severely elevated RVSP, severe BAE, moderate MR, severe TR, IVC dilated with <50% collapsibility     Assessment & Plan:   # Mild obstructive lung disease: Unclear etiology in this never smoker. CXR without emphysema, lung bases on CT A/P with a couple foci of scarring. I think his obstruction and bronchodilator response could be caused by chronic CHF (Magnussen, et al, Eur Journal Heart  Fail, 2017; [place], Heart Lung, 2013). Possible asthma but seems like symptoms have improved with control of anemia and valvular heart disease/PH.  # Pulmonary hypertension with underlying valvular heart disease, chronic diastolic heart failure Well compensated today.  # Lingula 1.2cm nodule On review of CTA Chest from 2020 he did have 1cm or so nodular focus of scar/atelectasis in lingula. No family history lung ca but did smoke pipe/tobacco for 20y.   Plan: - We will request a copy of a CD with images from your CT scan at [facility]. Please also call medical records there yourself to request a copy of the disc and drop off at our office and I will review it - We will tentatively set up follow up in 3 months but if this nodule looks exactly the same as it did on CT scan from 2020 here then it may not need any follow up at all       Maryjane Hurter, MD [facility] 03/01/2022 4:24 PM

## 2022-03-02 ENCOUNTER — Ambulatory Visit: Payer: Medicare PPO | Admitting: Student

## 2022-03-02 ENCOUNTER — Encounter: Payer: Self-pay | Admitting: Student

## 2022-03-02 VITALS — BP 118/68 | HR 89 | Ht 70.0 in | Wt 163.8 lb

## 2022-03-02 DIAGNOSIS — R911 Solitary pulmonary nodule: Secondary | ICD-10-CM | POA: Diagnosis not present

## 2022-03-02 NOTE — Patient Instructions (Addendum)
-   We will request a copy of a CD with images from your CT scan at [facility]. Please also call medical records there yourself to request a copy of the disc and drop off at our office and I will review it - We will tentatively set up follow up in 3 months but if this nodule looks exactly the same as it did on CT scan from 2020 here then it may not need any follow up at all.

## 2022-03-03 DIAGNOSIS — R82998 Other abnormal findings in urine: Secondary | ICD-10-CM | POA: Diagnosis not present

## 2022-03-03 DIAGNOSIS — I10 Essential (primary) hypertension: Secondary | ICD-10-CM | POA: Diagnosis not present

## 2022-03-03 DIAGNOSIS — E785 Hyperlipidemia, unspecified: Secondary | ICD-10-CM | POA: Diagnosis not present

## 2022-03-03 DIAGNOSIS — R7301 Impaired fasting glucose: Secondary | ICD-10-CM | POA: Diagnosis not present

## 2022-03-03 DIAGNOSIS — Z125 Encounter for screening for malignant neoplasm of prostate: Secondary | ICD-10-CM | POA: Diagnosis not present

## 2022-03-03 DIAGNOSIS — R7989 Other specified abnormal findings of blood chemistry: Secondary | ICD-10-CM | POA: Diagnosis not present

## 2022-03-05 ENCOUNTER — Other Ambulatory Visit (HOSPITAL_COMMUNITY): Payer: Self-pay | Admitting: Cardiology

## 2022-03-05 ENCOUNTER — Other Ambulatory Visit (HOSPITAL_COMMUNITY): Payer: Self-pay

## 2022-03-05 MED ORDER — APIXABAN 5 MG PO TABS
5.0000 mg | ORAL_TABLET | Freq: Two times a day (BID) | ORAL | 3 refills | Status: DC
  Filled 2022-03-05: qty 180, 90d supply, fill #0
  Filled 2022-06-10: qty 180, 90d supply, fill #1
  Filled 2022-09-02: qty 180, 90d supply, fill #2
  Filled 2022-12-23: qty 180, 90d supply, fill #3

## 2022-03-10 ENCOUNTER — Other Ambulatory Visit (HOSPITAL_COMMUNITY): Payer: Self-pay

## 2022-03-10 DIAGNOSIS — E785 Hyperlipidemia, unspecified: Secondary | ICD-10-CM | POA: Diagnosis not present

## 2022-03-10 DIAGNOSIS — Z1339 Encounter for screening examination for other mental health and behavioral disorders: Secondary | ICD-10-CM | POA: Diagnosis not present

## 2022-03-10 DIAGNOSIS — R7301 Impaired fasting glucose: Secondary | ICD-10-CM | POA: Diagnosis not present

## 2022-03-10 DIAGNOSIS — I509 Heart failure, unspecified: Secondary | ICD-10-CM | POA: Diagnosis not present

## 2022-03-10 DIAGNOSIS — Z1331 Encounter for screening for depression: Secondary | ICD-10-CM | POA: Diagnosis not present

## 2022-03-10 DIAGNOSIS — I110 Hypertensive heart disease with heart failure: Secondary | ICD-10-CM | POA: Diagnosis not present

## 2022-03-10 DIAGNOSIS — Z0000 Encounter for general adult medical examination without abnormal findings: Secondary | ICD-10-CM | POA: Diagnosis not present

## 2022-03-10 MED ORDER — ALPRAZOLAM 0.25 MG PO TABS
0.2500 mg | ORAL_TABLET | Freq: Four times a day (QID) | ORAL | 2 refills | Status: DC | PRN
  Filled 2022-03-10: qty 30, 8d supply, fill #0
  Filled 2022-06-14: qty 30, 8d supply, fill #1

## 2022-03-15 ENCOUNTER — Other Ambulatory Visit (HOSPITAL_COMMUNITY): Payer: Self-pay

## 2022-03-15 ENCOUNTER — Other Ambulatory Visit (HOSPITAL_COMMUNITY): Payer: Self-pay | Admitting: Cardiology

## 2022-03-16 ENCOUNTER — Other Ambulatory Visit (HOSPITAL_COMMUNITY): Payer: Self-pay

## 2022-03-16 DIAGNOSIS — I422 Other hypertrophic cardiomyopathy: Secondary | ICD-10-CM | POA: Diagnosis not present

## 2022-03-16 DIAGNOSIS — I081 Rheumatic disorders of both mitral and tricuspid valves: Secondary | ICD-10-CM | POA: Diagnosis not present

## 2022-03-16 MED ORDER — EMPAGLIFLOZIN 10 MG PO TABS
10.0000 mg | ORAL_TABLET | Freq: Every day | ORAL | 6 refills | Status: DC
  Filled 2022-03-16: qty 30, 30d supply, fill #0
  Filled 2022-04-07 – 2022-04-09 (×2): qty 30, 30d supply, fill #1
  Filled 2022-06-14: qty 30, 30d supply, fill #0
  Filled 2022-07-15: qty 30, 30d supply, fill #1
  Filled 2022-08-09: qty 30, 30d supply, fill #2
  Filled 2022-09-02: qty 30, 30d supply, fill #3

## 2022-03-18 DIAGNOSIS — I4510 Unspecified right bundle-branch block: Secondary | ICD-10-CM | POA: Diagnosis not present

## 2022-03-18 DIAGNOSIS — I361 Nonrheumatic tricuspid (valve) insufficiency: Secondary | ICD-10-CM | POA: Diagnosis not present

## 2022-03-18 DIAGNOSIS — I4891 Unspecified atrial fibrillation: Secondary | ICD-10-CM | POA: Diagnosis not present

## 2022-03-18 DIAGNOSIS — Z95818 Presence of other cardiac implants and grafts: Secondary | ICD-10-CM | POA: Diagnosis not present

## 2022-03-18 DIAGNOSIS — I4821 Permanent atrial fibrillation: Secondary | ICD-10-CM | POA: Diagnosis not present

## 2022-03-18 DIAGNOSIS — I340 Nonrheumatic mitral (valve) insufficiency: Secondary | ICD-10-CM | POA: Diagnosis not present

## 2022-03-18 DIAGNOSIS — Z905 Acquired absence of kidney: Secondary | ICD-10-CM | POA: Diagnosis not present

## 2022-03-18 DIAGNOSIS — R9431 Abnormal electrocardiogram [ECG] [EKG]: Secondary | ICD-10-CM | POA: Diagnosis not present

## 2022-03-25 DIAGNOSIS — I081 Rheumatic disorders of both mitral and tricuspid valves: Secondary | ICD-10-CM | POA: Diagnosis not present

## 2022-03-30 DIAGNOSIS — Z961 Presence of intraocular lens: Secondary | ICD-10-CM | POA: Diagnosis not present

## 2022-04-01 ENCOUNTER — Telehealth: Payer: Self-pay | Admitting: Student

## 2022-04-02 NOTE — Telephone Encounter (Signed)
I took the disc home with me thinking I had cd drive. I'll look at it as soon as I'm back in the office.   Thanks!

## 2022-04-02 NOTE — Telephone Encounter (Signed)
Went up front to obtain the disc that was dropped off yesterday 11/16 by pt and there was no disc up front in the basket.  Dr. Verlee Monte, please advise if you received this disc yesterday 11/16?

## 2022-04-05 NOTE — Telephone Encounter (Signed)
LMOM for Pt to return call to office about his CD that he dropped off.

## 2022-04-07 ENCOUNTER — Other Ambulatory Visit (HOSPITAL_COMMUNITY): Payer: Self-pay

## 2022-04-09 ENCOUNTER — Other Ambulatory Visit (HOSPITAL_COMMUNITY): Payer: Self-pay

## 2022-04-09 NOTE — Telephone Encounter (Signed)
My chart message sent. Stable nodule over 3 years on review of OSH CT scans. No need for further surveillance.

## 2022-04-26 ENCOUNTER — Other Ambulatory Visit (HOSPITAL_COMMUNITY): Payer: Self-pay | Admitting: Cardiology

## 2022-04-26 ENCOUNTER — Other Ambulatory Visit (HOSPITAL_COMMUNITY): Payer: Self-pay

## 2022-04-26 MED ORDER — SPIRONOLACTONE 25 MG PO TABS
12.5000 mg | ORAL_TABLET | Freq: Every day | ORAL | 0 refills | Status: DC
  Filled 2022-04-26: qty 45, 90d supply, fill #0

## 2022-04-27 DIAGNOSIS — H903 Sensorineural hearing loss, bilateral: Secondary | ICD-10-CM | POA: Diagnosis not present

## 2022-05-03 ENCOUNTER — Other Ambulatory Visit (HOSPITAL_COMMUNITY): Payer: Self-pay

## 2022-05-03 ENCOUNTER — Other Ambulatory Visit (HOSPITAL_COMMUNITY): Payer: Self-pay | Admitting: Cardiology

## 2022-05-03 MED ORDER — METOPROLOL SUCCINATE ER 50 MG PO TB24
50.0000 mg | ORAL_TABLET | Freq: Two times a day (BID) | ORAL | 1 refills | Status: DC
  Filled 2022-05-03: qty 180, 90d supply, fill #0
  Filled 2022-08-09: qty 180, 90d supply, fill #1

## 2022-05-04 ENCOUNTER — Other Ambulatory Visit (HOSPITAL_COMMUNITY): Payer: Self-pay

## 2022-05-18 ENCOUNTER — Other Ambulatory Visit (HOSPITAL_COMMUNITY): Payer: Self-pay

## 2022-05-25 ENCOUNTER — Other Ambulatory Visit (HOSPITAL_COMMUNITY): Payer: Self-pay

## 2022-05-25 DIAGNOSIS — H1032 Unspecified acute conjunctivitis, left eye: Secondary | ICD-10-CM | POA: Diagnosis not present

## 2022-05-25 DIAGNOSIS — H0100B Unspecified blepharitis left eye, upper and lower eyelids: Secondary | ICD-10-CM | POA: Diagnosis not present

## 2022-05-25 MED ORDER — OMEPRAZOLE 20 MG PO CPDR
20.0000 mg | DELAYED_RELEASE_CAPSULE | Freq: Every day | ORAL | 3 refills | Status: DC
  Filled 2022-05-25: qty 90, 90d supply, fill #0
  Filled 2022-08-09: qty 90, 90d supply, fill #1
  Filled 2022-11-18: qty 90, 90d supply, fill #2
  Filled 2023-01-26 – 2023-01-28 (×2): qty 90, 90d supply, fill #3

## 2022-05-25 MED ORDER — AZITHROMYCIN 250 MG PO TABS
ORAL_TABLET | ORAL | 3 refills | Status: AC
  Filled 2022-05-25: qty 6, 5d supply, fill #0
  Filled 2022-06-10: qty 6, 5d supply, fill #1
  Filled 2022-07-28: qty 6, 5d supply, fill #2

## 2022-05-25 MED ORDER — NEOMYCIN-POLYMYXIN-DEXAMETH 0.1 % OP SUSP
OPHTHALMIC | 0 refills | Status: AC
  Filled 2022-05-25: qty 5, 14d supply, fill #0

## 2022-05-26 ENCOUNTER — Other Ambulatory Visit: Payer: Self-pay

## 2022-05-29 NOTE — Progress Notes (Deleted)
Synopsis: Referred for asthma by Burnard Bunting, MD  Subjective:   PATIENT ID: John Holder GENDER: male DOB: 05/24/1931, MRN: [id number]  No chief complaint on file.  13yM with history of IDA, never smoker, ?asthma, persistent AF, mitral regurgitation, pulmonary hypertension (V/Q 8/18 negative, PFTs with normal DLCO but do show mild obstruction [post BD FEV1 of 94% predicted], TTE indeterminate for diastolic dysfunction, moderate MR, severe TR, has had severe left and right atrial dilation), GERD, left renal mass s/p nephrectomy who is referred for possible asthma and PH.   He says he only has DOE when he has anemia - this has been an issue for about 3 years. Unclear source of blood loss. He says when his hemoglobin is normal he has no activity limitation at all due to dyspnea. Plays tennis, walks his dog. Only time he notices dyspnea when his anemia is corrected is when he goes up a super steep, sustained incline. With Hb down to 10 he has to stop playing tennis, can't walk his usual route.   He has no cough, CP, orthopnea at all. Never had asthma as a kid. He has never smoked or vaped although he has been around second hand smoke for most of his life.    He snores mildly. He has no excessive daytime sleepiness. He has no PND.   Some knee pain but insignificant morning stiffness, no rashes.  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 Never took diet pills, ADHD medication. Smoked 1 pipe/day tobacco for 20 years, quit when he was 89yo.  Interval HPI:  Nodule is stable over last 3 years - see phone encounter 04/01/22  Otherwise pertinent review of systems is negative.  Past Medical History:  Diagnosis Date   Blood transfusion without reported diagnosis    Cataract    removed in replace bilateral   CHF (congestive heart failure) (HCC)     Dysrhythmia    GERD (gastroesophageal reflux disease)    Hypertension    Iron deficiency anemia    amdx 08/2018 >> Tx with PRBCs and Feraheme started; no GI source per workup; followed by Heme (Dr. Jana Hakim) and GI (Dr. Cristina Gong)    Left renal mass    s/p L nephrectomy   Mitral regurgitation    Echo 02/2016:  Mild conc LVH, EF 55-60, mild to mod MR, mod LAE, mod to severe RAE, mod TR, PASP 41 // Echo 10/2018: EF 60-65, mod conc LVH, severe BAE, trivial eff, mild MVP with mod MR, mod to severe TR, asc aorta 36 (mild dilation)    Persistent Atrial Fibrillation    Pulmonary hypertension ([place])    Echocardiogram 7/22: EF 60-65, no RWMA, normal RVSF, RVSP 70.7 (severe pulmonary hypertension), severe BAE, mod MR, severe TR, mild to mod AV sclerosis w/o AS   S/P mitral valve clip implantation 02/26/2021   G4 NT W device; A3/P3 with Dr. Burt Knack     Family History  Problem Relation Age of Onset   Breast cancer Mother 24   Hypertension Father    Diabetes Neg Hx    Stroke Neg Hx    CAD Neg Hx    Colon cancer Neg Hx    Colon polyps Neg Hx    Esophageal cancer Neg Hx    Rectal cancer Neg Hx    Stomach cancer  Neg Hx      Past Surgical History:  Procedure Laterality Date   CARDIAC CATHETERIZATION     CARDIOVERSION N/A 02/04/2016   Procedure: CARDIOVERSION;  Surgeon: Thayer Headings, MD;  Location: [facility];  Service: Cardiovascular;  Laterality: N/A;   CHOLECYSTECTOMY     COLONOSCOPY     COLONOSCOPY WITH PROPOFOL N/A 09/10/2018   Procedure: COLONOSCOPY WITH PROPOFOL;  Surgeon: Ronald Lobo, MD;  Location: [facility];  Service: Endoscopy;  Laterality: N/A;   ESOPHAGOGASTRODUODENOSCOPY (EGD) WITH PROPOFOL N/A 09/10/2018   Procedure: ESOPHAGOGASTRODUODENOSCOPY (EGD) WITH PROPOFOL;  Surgeon: Ronald Lobo, MD;  Location: [facility];  Service: Endoscopy;  Laterality: N/A;   EYE SURGERY     GIVENS CAPSULE STUDY N/A 09/10/2018   Procedure: GIVENS CAPSULE STUDY;  Surgeon: Ronald Lobo,  MD;  Location: [facility];  Service: Endoscopy;  Laterality: N/A;   MITRAL VALVE REPAIR N/A 02/26/2021   Procedure: MITRAL VALVE REPAIR;  Surgeon: [name], MD;  Location: [facility];  Service: Cardiovascular;  Laterality: N/A;   MITRAL VALVE REPAIR N/A 03/19/2021   Procedure: MITRAL VALVE REPAIR;  Surgeon: [name], MD;  Location: [facility];  Service: Cardiovascular;  Laterality: N/A;   NEPHRECTOMY Left 2002   RIGHT/LEFT HEART CATH AND CORONARY ANGIOGRAPHY N/A 01/27/2021   Procedure: RIGHT/LEFT HEART CATH AND CORONARY ANGIOGRAPHY;  Surgeon: Larey Dresser, MD;  Location: [facility];  Service: Cardiovascular;  Laterality: N/A;   TEE WITHOUT CARDIOVERSION N/A 01/27/2021   Procedure: TRANSESOPHAGEAL ECHOCARDIOGRAM (TEE);  Surgeon: Larey Dresser, MD;  Location: [facility];  Service: Cardiovascular;  Laterality: N/A;   TEE WITHOUT CARDIOVERSION N/A 02/26/2021   Procedure: TRANSESOPHAGEAL ECHOCARDIOGRAM (TEE);  Surgeon: [name], MD;  Location: [facility];  Service: Cardiovascular;  Laterality: N/A;   TEE WITHOUT CARDIOVERSION N/A 02/27/2021   Procedure: TRANSESOPHAGEAL ECHOCARDIOGRAM (TEE);  Surgeon: Larey Dresser, MD;  Location: [facility] ENDOSCOPY;  Service: Cardiovascular;  Laterality: N/A;   TEE WITHOUT CARDIOVERSION N/A 03/19/2021   Procedure: TRANSESOPHAGEAL ECHOCARDIOGRAM (TEE);  Surgeon: [name], MD;  Location: [place] [facility];  Service: Cardiovascular;  Laterality: N/A;   TONSILLECTOMY      Social History   Socioeconomic History   Marital status: Widowed    Spouse name: Not on file   Number of children: 3   Years of education: Not on file   Highest education level: Professional school degree (e.g., MD, DDS, DVM, JD)  Occupational History   Occupation: judge, lawyer, professor    Comment: retired from all 3  Tobacco Use   Smoking status: Former    Types: Pipe    Quit date: 05/17/1978    Years since quitting:  44.0   Smokeless tobacco: Never   Tobacco comments:    Smoked cigars for approx 40 years.   Vaping Use   Vaping Use: Never used  Substance and Sexual Activity   Alcohol use: Yes    Alcohol/week: 0.0 standard drinks of alcohol    Comment: occasional   Drug use: No   Sexual activity: Not on file  Other Topics Concern   Not on file  Social History Narrative   Not on file   Social Determinants of Health   Financial Resource Strain: Not on file  Food Insecurity: Not on file  Transportation Needs: Not on file  Physical Activity: Not on file  Stress: Not on file  Social Connections: Not on file  Intimate Partner Violence: Not on file  No Known Allergies   Outpatient Medications Prior to Visit  Medication Sig Dispense Refill   ALPRAZolam (XANAX) 0.25 MG tablet Take 1 tablet (0.25 mg total) by mouth every 6 (six) hours as needed for anxiety 30 tablet 0   ALPRAZolam (XANAX) 0.25 MG tablet Take 1 tablet (0.25 mg total) by mouth every 6 (six) hours as needed for anxiety 30 tablet 2   apixaban (ELIQUIS) 5 MG TABS tablet TAKE 1 TABLET BY MOUTH TWICE DAILY 180 tablet 3   Apoaequorin (PREVAGEN EXTRA STRENGTH PO) Take 1 tablet by mouth daily.     azithromycin (ZITHROMAX) 250 MG tablet Take 2 tablets (500 mg total) by mouth daily for 1 day, THEN 1 tablet (250 mg) daily for 4 days 6 tablet 3   Barberry-Oreg Grape-Goldenseal (BERBERINE COMPLEX PO) Take 2,400 mg by mouth daily.     Cholecalciferol 50 MCG (2000 UT) CAPS take one capsule Orally daily     Chromium 1000 MCG TABS Take 1,000 mcg by mouth daily. chromium picolinate     Coenzyme Q10 (CO Q10) 200 MG CAPS Take 200 mg by mouth daily.     empagliflozin (JARDIANCE) 10 MG TABS tablet Take 1 tablet (10 mg total) by mouth daily before breakfast. 30 tablet 6   ferrous gluconate (FERGON) 324 MG tablet Take 324 mg by mouth daily with breakfast.     furosemide (LASIX) 40 MG tablet Take 1 tablet (40 mg total) by mouth daily. 90 tablet 3   KRILL  OIL PO Take 1 capsule by mouth daily at 12 noon.     losartan (COZAAR) 25 MG tablet Take 1 tablet (25 mg total) by mouth daily. 90 tablet 3   MAGNESIUM GLYCINATE PO Take 525 mg by mouth in the morning and at bedtime.     Melatonin 10 MG TABS Take 10 mg by mouth at bedtime.     metoprolol succinate (TOPROL-XL) 50 MG 24 hr tablet Take 1 tablet (50 mg total) by mouth 2 (two) times daily. Take with or immediately following a meal 180 tablet 1   Misc Natural Products (ADVANCED JOINT RELIEF PO) Take 1 tablet by mouth 2 (two) times daily.     Multiple Vitamin (MULTIVITAMIN) capsule Take 1 capsule by mouth daily. Mega men 50 plus     Multiple Vitamins-Minerals (ZINC PO) Take by mouth.     neomycin-polymyxin-dexamethasone (MAXITROL) 0.1 % ophthalmic suspension Place 1 drop into the left eye 4 (four) times daily for 7 days, THEN 1 drop left eye 2 (two) times daily for 7 days. 5 mL 0   NON FORMULARY Take 300 mg by mouth 2 (two) times daily. TRU vitamin  Nigen     omeprazole (PRILOSEC) 20 MG capsule Take 1 capsule (20 mg total) by mouth daily. 90 capsule 3   OVER THE COUNTER MEDICATION Take 2 capsules by mouth daily. Prostacare     OVER THE COUNTER MEDICATION Take 1 capsule by mouth 2 (two) times daily. Uricare Himalaya     OVER THE COUNTER MEDICATION Take 1 tablet by mouth 2 (two) times daily. virectin     OVER THE COUNTER MEDICATION Take 1 tablet by mouth 2 (two) times daily. Liver care     OVER THE COUNTER MEDICATION Take 1 tablet by mouth 2 (two) times daily. Tri Flex     OVER THE COUNTER MEDICATION Take 1 capsule by mouth 2 (two) times daily. conjugated linoleic acid     OVER THE COUNTER MEDICATION Take 1 tablet by mouth daily  in the afternoon. CDB Gummies     OVER THE COUNTER MEDICATION Take 1 capsule by mouth daily in the afternoon. Hemp oil gummies     OVER THE COUNTER MEDICATION Take 1 tablet by mouth daily. arthrozene     Polyethyl Glycol-Propyl Glycol (SYSTANE) 0.4-0.3 % SOLN 1 drop daily as  needed (as needed for dry eye).     Sodium Hyaluronate, oral, (HYALURONIC ACID) 100 MG CAPS Take 100 mg by mouth 2 (two) times daily.     spironolactone (ALDACTONE) 25 MG tablet Take 1/2 tablet (12.5 mg total) by mouth daily. 45 tablet 0   tadalafil (CIALIS) 20 MG tablet TAKE ONE TABLET BY MOUTH DAILY AS NEEDED 30 tablet 11   No facility-administered medications prior to visit.       Objective:   Physical Exam:  General appearance: 89 y.o., male, NAD, conversant  Eyes: anicteric sclerae; PERRL, tracking appropriately HENT: NCAT; MMM Neck: Trachea midline; no lymphadenopathy, no JVD Lungs: CTAB, no crackles, no wheeze, with normal respiratory effort CV: RRR, no murmur  Abdomen: Soft, non-tender; non-distended, BS present  Extremities: No peripheral edema, warm Skin: Normal turgor and texture; no rash Psych: Appropriate affect Neuro: Alert and oriented to person and place, no focal deficit     There were no vitals filed for this visit.    on RA BMI Readings from Last 3 Encounters:  03/02/22 23.50 kg/m  02/19/22 22.73 kg/m  11/23/21 22.71 kg/m   Wt Readings from Last 3 Encounters:  03/02/22 163 lb 12.8 oz (74.3 kg)  02/19/22 163 lb (73.9 kg)  11/23/21 162 lb 12.8 oz (73.8 kg)     CBC    Component Value Date/Time   WBC 3.4 (L) 02/19/2022 0933   RBC 4.82 02/19/2022 0933   HGB 15.5 02/19/2022 0933   HGB 10.0 (L) 11/26/2020 1312   HCT 46.2 02/19/2022 0933   HCT 31.3 (L) 11/26/2020 1312   PLT 132 (L) 02/19/2022 0933   PLT 159 11/26/2020 1312   MCV 95.9 02/19/2022 0933   MCV 83 11/26/2020 1312   MCH 32.2 02/19/2022 0933   MCHC 33.5 02/19/2022 0933   RDW 13.2 02/19/2022 0933   RDW 15.7 (H) 11/26/2020 1312   LYMPHSABS 0.7 01/08/2021 1501   MONOABS 0.4 01/08/2021 1501   EOSABS 0.2 01/08/2021 1501   BASOSABS 0.0 01/08/2021 1501     Chest Imaging: CXR 12/31/20 reviewed by me and remarkable for borderline enlarged CMS  CT Coronaries 01/28/22 reviewed by me  with 1.2cm nodular opacity lingula - images not available in [facility]  Pulmonary Functions Testing Results:    Latest Ref Rng & Units 12/29/2020   11:57 AM  PFT Results  FVC-Pre L 3.29   FVC-Predicted Pre % 87   FVC-Post L 3.64   FVC-Predicted Post % 96   Pre FEV1/FVC % % 62   Post FEV1/FCV % % 68   FEV1-Pre L 2.05   FEV1-Predicted Pre % 78   FEV1-Post L 2.47   DLCO uncorrected ml/min/mmHg 19.97   DLCO UNC% % 84   DLCO corrected ml/min/mmHg 23.76   DLCO COR %Predicted % 100   DLVA Predicted % 108   TLC L 6.48   TLC % Predicted % 91   RV % Predicted % 112      Echocardiogram:   TTE 12/12/20: Normal EF, indeterminate for diastolic dysfunction, severely elevated RVSP, severe BAE, moderate MR, severe TR, IVC dilated with <50% collapsibility     Assessment & Plan:   #  Mild obstructive lung disease: Unclear etiology in this never smoker. CXR without emphysema, lung bases on CT A/P with a couple foci of scarring. I think his obstruction and bronchodilator response could be caused by chronic CHF (Magnussen, et al, Eur Journal Heart Fail, 2017; [place], Heart Lung, 2013). Possible asthma but seems like symptoms have improved with control of anemia and valvular heart disease/PH.  # Pulmonary hypertension with underlying valvular heart disease, chronic diastolic heart failure Well compensated today.  # Lingula 1.2cm nodule On review of CTA Chest from 2020 he did have 1cm or so nodular focus of scar/atelectasis in lingula. No family history lung ca but did smoke pipe/tobacco for 20y.   Plan: - We will request a copy of a CD with images from your CT scan at [facility]. Please also call medical records there yourself to request a copy of the disc and drop off at our office and I will review it - We will tentatively set up follow up in 3 months but if this nodule looks exactly the same as it did on CT scan from 2020 here then it may not need any follow up at all       Maryjane Hurter, MD [facility] 05/29/2022 10:56 AM

## 2022-06-02 ENCOUNTER — Ambulatory Visit: Payer: Medicare PPO | Admitting: Student

## 2022-06-07 DIAGNOSIS — L814 Other melanin hyperpigmentation: Secondary | ICD-10-CM | POA: Diagnosis not present

## 2022-06-07 DIAGNOSIS — D2261 Melanocytic nevi of right upper limb, including shoulder: Secondary | ICD-10-CM | POA: Diagnosis not present

## 2022-06-07 DIAGNOSIS — Z85828 Personal history of other malignant neoplasm of skin: Secondary | ICD-10-CM | POA: Diagnosis not present

## 2022-06-07 DIAGNOSIS — L821 Other seborrheic keratosis: Secondary | ICD-10-CM | POA: Diagnosis not present

## 2022-06-07 DIAGNOSIS — D225 Melanocytic nevi of trunk: Secondary | ICD-10-CM | POA: Diagnosis not present

## 2022-06-07 DIAGNOSIS — Z85820 Personal history of malignant melanoma of skin: Secondary | ICD-10-CM | POA: Diagnosis not present

## 2022-06-08 DIAGNOSIS — H04123 Dry eye syndrome of bilateral lacrimal glands: Secondary | ICD-10-CM | POA: Diagnosis not present

## 2022-06-08 DIAGNOSIS — H00014 Hordeolum externum left upper eyelid: Secondary | ICD-10-CM | POA: Diagnosis not present

## 2022-06-08 DIAGNOSIS — H0100B Unspecified blepharitis left eye, upper and lower eyelids: Secondary | ICD-10-CM | POA: Diagnosis not present

## 2022-06-08 DIAGNOSIS — H00015 Hordeolum externum left lower eyelid: Secondary | ICD-10-CM | POA: Diagnosis not present

## 2022-06-10 ENCOUNTER — Other Ambulatory Visit: Payer: Self-pay

## 2022-06-10 ENCOUNTER — Other Ambulatory Visit (HOSPITAL_COMMUNITY): Payer: Self-pay

## 2022-06-11 ENCOUNTER — Other Ambulatory Visit (HOSPITAL_COMMUNITY): Payer: Self-pay

## 2022-06-14 ENCOUNTER — Other Ambulatory Visit (HOSPITAL_COMMUNITY): Payer: Self-pay

## 2022-06-14 ENCOUNTER — Other Ambulatory Visit: Payer: Self-pay

## 2022-06-29 ENCOUNTER — Encounter (HOSPITAL_COMMUNITY): Payer: Medicare PPO | Admitting: Cardiology

## 2022-07-15 ENCOUNTER — Encounter (HOSPITAL_COMMUNITY): Payer: Self-pay | Admitting: Cardiology

## 2022-07-15 ENCOUNTER — Ambulatory Visit (HOSPITAL_COMMUNITY)
Admission: RE | Admit: 2022-07-15 | Discharge: 2022-07-15 | Disposition: A | Discharge status: Home / Self Care | Payer: Medicare PPO | Source: Ambulatory Visit | Attending: Cardiology | Admitting: Cardiology

## 2022-07-15 ENCOUNTER — Other Ambulatory Visit (HOSPITAL_COMMUNITY): Payer: Self-pay

## 2022-07-15 VITALS — BP 102/70 | HR 92 | Wt 163.4 lb

## 2022-07-15 DIAGNOSIS — Z79899 Other long term (current) drug therapy: Secondary | ICD-10-CM | POA: Diagnosis not present

## 2022-07-15 DIAGNOSIS — Z862 Personal history of diseases of the blood and blood-forming organs and certain disorders involving the immune mechanism: Secondary | ICD-10-CM | POA: Insufficient documentation

## 2022-07-15 DIAGNOSIS — I081 Rheumatic disorders of both mitral and tricuspid valves: Secondary | ICD-10-CM | POA: Diagnosis not present

## 2022-07-15 DIAGNOSIS — I4510 Unspecified right bundle-branch block: Secondary | ICD-10-CM | POA: Diagnosis not present

## 2022-07-15 DIAGNOSIS — Z7901 Long term (current) use of anticoagulants: Secondary | ICD-10-CM | POA: Insufficient documentation

## 2022-07-15 DIAGNOSIS — I5032 Chronic diastolic (congestive) heart failure: Secondary | ICD-10-CM

## 2022-07-15 DIAGNOSIS — I071 Rheumatic tricuspid insufficiency: Secondary | ICD-10-CM | POA: Diagnosis not present

## 2022-07-15 DIAGNOSIS — I272 Pulmonary hypertension, unspecified: Secondary | ICD-10-CM | POA: Insufficient documentation

## 2022-07-15 DIAGNOSIS — I482 Chronic atrial fibrillation, unspecified: Secondary | ICD-10-CM

## 2022-07-15 DIAGNOSIS — I340 Nonrheumatic mitral (valve) insufficiency: Secondary | ICD-10-CM | POA: Diagnosis not present

## 2022-07-15 DIAGNOSIS — K7460 Unspecified cirrhosis of liver: Secondary | ICD-10-CM | POA: Insufficient documentation

## 2022-07-15 LAB — BASIC METABOLIC PANEL
Anion gap: 7 (ref 5–15)
BUN: 23 mg/dL (ref 8–23)
CO2: 27 mmol/L (ref 22–32)
Calcium: 9 mg/dL (ref 8.9–10.3)
Chloride: 101 mmol/L (ref 98–111)
Creatinine, Ser: 1.17 mg/dL (ref 0.61–1.24)
GFR, Estimated: 60 mL/min — ABNORMAL LOW (ref >60)
Glucose, Bld: 118 mg/dL — ABNORMAL HIGH (ref 70–99)
Potassium: 3.9 mmol/L (ref 3.5–5.1)
Sodium: 135 mmol/L (ref 135–145)

## 2022-07-15 LAB — CBC
HCT: 44.3 % (ref 39.0–52.0)
Hemoglobin: 14.9 g/dL (ref 13.0–17.0)
MCH: 31.5 pg (ref 26.0–34.0)
MCHC: 33.6 g/dL (ref 30.0–36.0)
MCV: 93.7 fL (ref 80.0–100.0)
Platelets: 138 10*3/uL — ABNORMAL LOW (ref 150–400)
RBC: 4.73 MIL/uL (ref 4.22–5.81)
RDW: 13.2 % (ref 11.5–15.5)
WBC: 2.6 10*3/uL — ABNORMAL LOW (ref 4.0–10.5)
nRBC: 0 % (ref 0.0–0.2)

## 2022-07-15 LAB — BRAIN NATRIURETIC PEPTIDE: B Natriuretic Peptide: 317.8 pg/mL — ABNORMAL HIGH (ref 0.0–100.0)

## 2022-07-15 MED ORDER — FUROSEMIDE 20 MG PO TABS
20.0000 mg | ORAL_TABLET | Freq: Every day | ORAL | 7 refills | Status: DC
  Filled 2022-07-15: qty 30, 30d supply, fill #0
  Filled 2022-10-12: qty 30, 30d supply, fill #1
  Filled 2022-11-08: qty 30, 30d supply, fill #2
  Filled 2022-12-09: qty 30, 30d supply, fill #3
  Filled 2023-01-11: qty 30, 30d supply, fill #4
  Filled 2023-02-14: qty 30, 30d supply, fill #5

## 2022-07-15 NOTE — Progress Notes (Signed)
PCP: Burnard Bunting, MD Cardiology: Dr. Acie Fredrickson HF Cardiology: Dr. Aundra Dubin  89 y.o. with history of chronic atrial fibrillation, diastolic CHF, pulmonary hypertension, [name] was referred by Dr. Acie Fredrickson for evaluation of CHF and pulmonary hypertension. Patient was found to be in atrial fibrillation back in 2017.  At that time, he had a cardioversion.  This did not hold, and he has been in atrial fibrillation since 2017 chronically as far as I can tell. He has had Fe deficiency anemia requiring Fe infusions, GI workup was unrevealing.  Also of note, he has cirrhosis by 9/21 MRI abdomen, cause uncertain (not a heavy drinker).   In the summer of 2022, his legs began to swell significantly and developed weeping sores.  He developed significant dyspnea.  He was seen in the cardiology clinic and started on Lasix.  He cut back on sodium.  Echo in 7/22 showed EF 60-65%, normal RV, PASP 71 mmHg, severe biatrial enlargement, severe TR, probably moderate MR, IVC dilated. Over time, his symptoms haves improved.   TEE in 9/22 showed severe TR (functional) and severe MR (prolapse/partial P3 flail).  LHC/RHC in 9/22 showed minimal CAD, mildly elevated PCWP.     Patient had Mitraclip placed in 10/22 but the device failed the day after with device only attached to one leaflet.  Patient had redo Mitraclip in 11/22, this time it was successful with new clip adjacent to original clip.  Echo in 12/22 showed EF 60-65%, D-shaped septum with moderate RV enlargement and mildly decreased systolic function, PASP 55 mmHg, severe biatrial enlargement, 2 Mitraclips with only 1 attached to both leaflets, mild MR with mean gradient 3 mmHg, severe TR, small iatrogenic secundum ASD, dilated IVC.   Echo in 3/23 showed EF 60-65%, D-shaped septum, mild RV dysfunction with mildly enlarged RV, PASP 50 mmHg, severe biatrial enlargement, mild MR s/p Mitraclip with mean gradient 4.5 mmHg, severe TR.   Patient had tTEER as part  of Triluminate study on 02/10/22.  2 clips were placed . Post-procedure echo in 9/23 showed EF 66%, severe RV dilation with low normal systolic function, 2 Mitraclips with mild-moderate MR, 2 Triclips with moderate TR, PASP 63 mmHg.   Echo at [facility] in 10/23 showed LVEF 62%, mild LVH, mild RV dilation with normal systolic function, mild MR with 2 mitraclips and mean gradient 4, moderate TR with 2 triclips, PASP 42 mmHg.  Cardiac MRI in 11/23 showed LV EF 73%, RV EF 53%, mild-moderate TR with regurgitant fraction 20%.   Patient returns for followup of CHF and mitral regurgitation/tricuspid regurgitation.  He is doing very well.  Plays tennis twice a week and notes that stamina is better since he had the Triclips placed.  No significant exertional dyspnea.  No problems walking up stairs. No chest pain.  No lightheadedness.  Rare lightheadedness with standing too fast.  No BRBPR/melena.  ECG (personally reviewed): atrial fibrillation, iRBBB, inferior TWIs  Labs (7/22): pro-BNP 1404 Labs (8/22): hgb 12.8, K 4.2, creatinine 1.11 Labs (9/22): K 4.5, creatinine 1.06 Labs (11/22): K 4.9, creatinine 1.04, hgb 12.1 Labs (12/22): K 4.2, creatinine 1.14, BNP 318 Labs (3/23): K 4.1, creatinine 1.12, BNP 292 Labs (9/23): K 4.3, creatinine 1.13 Labs (10/23): K 4.9, creatinine 1.22  PMH: 1. Pulmonary hypertension: Echo in 7/22 with PASP 71 mmHg.  - V/Q scan (8/22): no evidence for chronic PE.  - PFTs (8/22): Minimal airways obstruction - RHC (9/22) showed pulmonary venous hypertension.  2. Fe deficiency anemia: He had extensive  GI evaluation in the past that was unrevealing.  Followed by Dr. Jana Hakim, has had Fe infusions.  3. Chronic atrial fibrillation: Diagnosed in 2017, failed DCCV in 2017 and appears to have been in atrial fibrillation since then.  4. Cirrhosis: 9/21 abdominal MRI showed cirrhosis.  No history of heavy ETOH, cause uncertain.  5. Chronic diastolic CHF: Echo (99991111) with EF 60-65%, normal RV,  PASP 71 mmHg, severe biatrial enlargement, severe TR, probably moderate MR, IVC dilated.  6. Valvular heart disease: Echo in 7/22 with probably moderate MR, severe TR.  Suspect functional due to AF/dilated atria.  - TEE (9/22): EF 55-60%, RV moderately dilated with normal systolic function, moderate LAE, severe RAE, severe TR (functional) with peak RV-RA gradient 45 mmHg, severe highly eccentric mitral regurgitation with prolapse and partial flail of the P3 segment of the posterior leaflet.   - LHC/RHC (9/22): Nonobstructive mild CAD; mean RA 6, PA 47/15 mean 31, mean PCWP 15 with v-waves to 32, CI 3.32, PVR 2.5 WU.  - Failed Mitraclip in 10/22, successful Mitraclip in 11/22.  - Echo (12/22): EF 60-65%, D-shaped septum with moderate RV enlargement and mildly decreased systolic function, PASP 55 mmHg, severe biatrial enlargement, 2 Mitraclips with only 1 attached to both leaflets, mild MR with mean gradient 3 mmHg, severe TR, small iatrogenic secundum ASD, dilated IVC.  - Echo (3/23): EF 60-65%, D-shaped septum, mild RV dysfunction with mildly enlarged RV, PASP 50 mmHg, severe biatrial enlargement, mild MR s/p Mitraclip with mean gradient 4.5 mmHg, severe TR.  - tTEER as part of Triluminate study in 9/23.  - Post-tTEER echo in 9/23: EF 66%, severe RV dilation with low normal systolic function, 2 Mitraclips with mild-moderate MR, 2 Triclips with moderate TR, PASP 63 mmHg.  - Echo (10/23, [place]): LVEF 62%, mild LVH, mild RV dilation with normal systolic function, mild MR with 2 mitraclips and mean gradient 4, moderate TR with 2 triclips, PASP 42 mmHg.   - Cardiac MRI (11/23, [facility]): LV EF 73%, RV EF 53%, mild-moderate TR with regurgitant fraction 20%.  7. Left renal mass: S/p left nephrectomy.  8. T-spine compression fracture.  9. Melanoma  SH: Widower, occasional ETOH (not heavy), nonsmoker.  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  [employer] graduate.   Family History   Problem Relation Age of Onset   Breast cancer Mother 3   Hypertension Father    Diabetes Neg Hx    Stroke Neg Hx    CAD Neg Hx    Colon cancer Neg Hx    Colon polyps Neg Hx    Esophageal cancer Neg Hx    Rectal cancer Neg Hx    Stomach cancer Neg Hx    ROS: All systems reviewed and negative except as per HPI.   Current Outpatient Medications  Medication Sig Dispense Refill   ALPRAZolam (XANAX) 0.25 MG tablet Take 1 tablet (0.25 mg total) by mouth every 6 (six) hours as needed for anxiety 30 tablet 0   apixaban (ELIQUIS) 5 MG TABS tablet TAKE 1 TABLET BY MOUTH TWICE DAILY 180 tablet 3   Apoaequorin (PREVAGEN EXTRA STRENGTH PO) Take 1 tablet by mouth daily.     Barberry-Oreg Grape-Goldenseal (BERBERINE COMPLEX PO) Take 2,400 mg by mouth daily.     Cholecalciferol 50 MCG (2000 UT) CAPS take one capsule Orally daily     Chromium 1000 MCG TABS Take 1,000 mcg by mouth daily. chromium picolinate     Coenzyme  Q10 (CO Q10) 200 MG CAPS Take 200 mg by mouth daily.     empagliflozin (JARDIANCE) 10 MG TABS tablet Take 1 tablet (10 mg total) by mouth daily before breakfast. 30 tablet 6   ferrous gluconate (FERGON) 324 MG tablet Take 324 mg by mouth daily with breakfast.     losartan (COZAAR) 25 MG tablet Take 1 tablet (25 mg total) by mouth daily. 90 tablet 3   MAGNESIUM GLYCINATE PO Take 525 mg by mouth in the morning and at bedtime.     Melatonin 10 MG TABS Take 10 mg by mouth at bedtime.     metoprolol succinate (TOPROL-XL) 50 MG 24 hr tablet Take 1 tablet (50 mg total) by mouth 2 (two) times daily. Take with or immediately following a meal 180 tablet 1   Misc Natural Products (ADVANCED JOINT RELIEF PO) Take 1 tablet by mouth 2 (two) times daily.     Multiple Vitamin (MULTIVITAMIN) capsule Take 1 capsule by mouth daily. Mega men 50 plus     Multiple Vitamins-Minerals (ZINC PO) Take by mouth daily.     NON FORMULARY Take 300 mg by mouth 2 (two) times daily. TRU vitamin  Nigen     omeprazole  (PRILOSEC) 20 MG capsule Take 1 capsule (20 mg total) by mouth daily. 90 capsule 3   OVER THE COUNTER MEDICATION Take 2 capsules by mouth daily. Prostacare     OVER THE COUNTER MEDICATION Take 1 capsule by mouth 2 (two) times daily. Uricare Himalaya     OVER THE COUNTER MEDICATION Take 1 tablet by mouth 2 (two) times daily. virectin     OVER THE COUNTER MEDICATION Take 1 tablet by mouth 2 (two) times daily. Liver care     OVER THE COUNTER MEDICATION Take 1 tablet by mouth 2 (two) times daily. Tri Flex     OVER THE COUNTER MEDICATION Take 1 capsule by mouth 2 (two) times daily. conjugated linoleic acid     OVER THE COUNTER MEDICATION Take 1 tablet by mouth daily in the afternoon. CDB Gummies     OVER THE COUNTER MEDICATION Take 1 capsule by mouth daily in the afternoon. Hemp oil gummies     OVER THE COUNTER MEDICATION Take 1 tablet by mouth daily. arthrozene     Polyethyl Glycol-Propyl Glycol (SYSTANE) 0.4-0.3 % SOLN 1 drop daily as needed (as needed for dry eye).     Sodium Hyaluronate, oral, (HYALURONIC ACID) 100 MG CAPS Take 100 mg by mouth 2 (two) times daily.     spironolactone (ALDACTONE) 25 MG tablet Take 1/2 tablet (12.5 mg total) by mouth daily. 45 tablet 0   tadalafil (CIALIS) 20 MG tablet TAKE ONE TABLET BY MOUTH DAILY AS NEEDED 30 tablet 11   furosemide (LASIX) 20 MG tablet Take 1 tablet (20 mg total) by mouth daily. 30 tablet 7   No current facility-administered medications for this encounter.   BP 102/70   Pulse 92   Wt 74.1 kg (163 lb 6.4 oz)   SpO2 92%   BMI 23.45 kg/m  General: NAD Neck: No JVD, no thyromegaly or thyroid nodule.  Lungs: Clear to auscultation bilaterally with normal respiratory effort. CV: Nondisplaced PMI.  Heart regular S1/S2, no S3/S4, 1/6 HSM LLSB.  No peripheral edema.  No carotid bruit.  Normal pedal pulses.  Abdomen: Soft, nontender, no hepatosplenomegaly, no distention.  Skin: Intact without lesions or rashes.  Neurologic: Alert and oriented x 3.   Psych: Normal affect. Extremities: No clubbing or  cyanosis.  HEENT: Normal.   Assessment/Plan: 1. Chronic diastolic CHF: Echo in 99991111 with EF 60-65%, normal RV, PASP 71 mmHg, severe biatrial enlargement, severe TR, probably moderate MR, IVC dilated.  In the summer of 2022, he developed symptomatic CHF, this has improved with diuresis and sodium restriction.  TEE showed severe functional TR and severe primary mitral regurgitation with partial flail posterior leaflet (P3).  I suspect that CHF is due to chronic atrial fibrillation + valvular disease (MR, TR).  He is now s/p Mitraclip, last echo in 12/22 showed EF 60-65%, D-shaped septum with moderate RV enlargement and mildly decreased systolic function, PASP 55 mmHg, severe biatrial enlargement, 2 Mitraclips with only 1 attached to both leaflets, mild MR with mean gradient 3 mmHg, severe TR, small iatrogenic secundum ASD, dilated IVC. Echo in 3/23 showed EF 60-65%, D-shaped septum, mild RV dysfunction with mildly enlarged RV, PASP 50 mmHg, severe biatrial enlargement, mild MR s/p Mitraclip with mean gradient 4.5 mmHg, severe TR.  Patient then had tTEER in 9/23. Post-op echo showed EF 66%, severe RV dilation with low normal systolic function, 2 Mitraclips with mild-moderate MR, 2 Triclips with moderate TR, PASP 63 mmHg.  Echo at [facility] in 10/23 showed LVEF 62%, mild LVH, mild RV dilation with normal systolic function, mild MR with 2 mitraclips and mean gradient 4, moderate TR with 2 triclips, PASP 42 mmHg.  Cardiac MRI in 11/23 showed LV EF 73%, RV EF 53%, mild-moderate TR with regurgitant fraction 20%.  He describes minimal symptoms currently, NYHA class I. Not volume overloaded on exam.  - Decrease Lasix to 20 mg daily.  BMET/BNP today.  - Continue empagliflozin 10 mg daily.    - Continue spironolactone 12.5 daily.   2. Pulmonary hypertension: Pulmonary venous hypertension by 9/22 RHC.  PASP was 63 on post-tTEER echo and 42 mmHg on 10/23 echo.  This is  improving, but he could have some residual PH due to pulmonary vascular remodeling in the setting of long-standing valvular disease.  3. Atrial fibrillation: This is chronic and likely permanent (present since 2017).  The atria are severely dilated on echo. I do not think that he would be likely to hold NSR with DCCV, even with anti-arrhythmic.  - Continue Eliquis. Check CBC today.  - Good rate control on current Toprol XL.  4. Valvular heart disease: He is s/p Mitraclip with 3/23 echo showing mild MR and mild MS, but he still had severe TR.  Symptomatically improved s/p Mitraclip.  With severe TR and RV changes already noted on echo (D-shaped septum, mild RV enlargement and hypokinesis), I was concerned that he could develop progressive RV failure over time. He then had tTEER with 2 Triclips in 9/23 as part of Triluminate trial.    Followup in 6 months.   Loralie Champagne 07/15/2022

## 2022-07-15 NOTE — Patient Instructions (Signed)
Good to see you today!    DECREASE Lasix to 20 mg daily  Labs done today, your results will be available in MyChart, we will contact you for abnormal readings.  Your physician recommends that you schedule a follow-up appointment in: 6 months(September) Call office in July to schedule an appointment  If you have any questions or concerns before your next appointment please send [place] a message through [place] or call our office at [phone].    TO LEAVE A MESSAGE FOR THE NURSE SELECT OPTION 2, PLEASE LEAVE A MESSAGE INCLUDING: YOUR NAME DATE OF BIRTH CALL BACK NUMBER REASON FOR CALL**this is important as we prioritize the call backs  YOU WILL RECEIVE A CALL BACK THE SAME DAY AS LONG AS YOU CALL BEFORE 4:00 PM  At the [facility], you and your health needs are our priority. As part of our continuing mission to provide you with exceptional heart care, we have created designated Provider Care Teams. These Care Teams include your primary Cardiologist (physician) and Advanced Practice Providers (APPs- Physician Assistants and Nurse Practitioners) who all work together to provide you with the care you need, when you need it.   You may see any of the following providers on your designated Care Team at your next follow up: Dr Glori Bickers Dr Loralie Champagne Dr. Roxana Hires, NP Lyda Jester, [place] [facility] [place], [place] Forestine Na, NP Audry Riles, PharmD   Please be sure to bring in all your medications bottles to every appointment.    Thank you for choosing [facility]

## 2022-07-26 ENCOUNTER — Other Ambulatory Visit (HOSPITAL_COMMUNITY): Payer: Self-pay

## 2022-07-26 ENCOUNTER — Other Ambulatory Visit (HOSPITAL_COMMUNITY): Payer: Self-pay | Admitting: Cardiology

## 2022-07-26 MED ORDER — SPIRONOLACTONE 25 MG PO TABS
12.5000 mg | ORAL_TABLET | Freq: Every day | ORAL | 0 refills | Status: DC
  Filled 2022-07-26: qty 45, 90d supply, fill #0

## 2022-07-28 ENCOUNTER — Other Ambulatory Visit (HOSPITAL_COMMUNITY): Payer: Self-pay

## 2022-08-09 ENCOUNTER — Other Ambulatory Visit (HOSPITAL_COMMUNITY): Payer: Self-pay

## 2022-08-25 DIAGNOSIS — H0100B Unspecified blepharitis left eye, upper and lower eyelids: Secondary | ICD-10-CM | POA: Diagnosis not present

## 2022-08-25 DIAGNOSIS — H0100A Unspecified blepharitis right eye, upper and lower eyelids: Secondary | ICD-10-CM | POA: Diagnosis not present

## 2022-08-25 DIAGNOSIS — H04123 Dry eye syndrome of bilateral lacrimal glands: Secondary | ICD-10-CM | POA: Diagnosis not present

## 2022-09-02 ENCOUNTER — Other Ambulatory Visit (HOSPITAL_COMMUNITY): Payer: Self-pay

## 2022-09-03 ENCOUNTER — Other Ambulatory Visit: Payer: Self-pay

## 2022-09-03 MED ORDER — ALPRAZOLAM 0.25 MG PO TABS
0.2500 mg | ORAL_TABLET | Freq: Four times a day (QID) | ORAL | 0 refills | Status: DC | PRN
  Filled 2022-09-03: qty 30, 8d supply, fill #0

## 2022-10-13 DIAGNOSIS — D72819 Decreased white blood cell count, unspecified: Secondary | ICD-10-CM | POA: Diagnosis not present

## 2022-10-13 DIAGNOSIS — I482 Chronic atrial fibrillation, unspecified: Secondary | ICD-10-CM | POA: Diagnosis not present

## 2022-10-13 DIAGNOSIS — D649 Anemia, unspecified: Secondary | ICD-10-CM | POA: Diagnosis not present

## 2022-10-13 DIAGNOSIS — D6859 Other primary thrombophilia: Secondary | ICD-10-CM | POA: Diagnosis not present

## 2022-10-13 DIAGNOSIS — I110 Hypertensive heart disease with heart failure: Secondary | ICD-10-CM | POA: Diagnosis not present

## 2022-10-18 ENCOUNTER — Other Ambulatory Visit (HOSPITAL_COMMUNITY): Payer: Self-pay

## 2022-10-18 ENCOUNTER — Other Ambulatory Visit (HOSPITAL_COMMUNITY): Payer: Self-pay | Admitting: Cardiology

## 2022-10-18 MED ORDER — EMPAGLIFLOZIN 10 MG PO TABS
10.0000 mg | ORAL_TABLET | Freq: Every day | ORAL | 11 refills | Status: DC
  Filled 2022-10-18: qty 30, 30d supply, fill #0
  Filled 2022-11-16: qty 30, 30d supply, fill #1
  Filled 2022-12-14: qty 30, 30d supply, fill #2
  Filled 2023-01-11: qty 30, 30d supply, fill #3
  Filled 2023-02-14: qty 30, 30d supply, fill #4

## 2022-10-29 ENCOUNTER — Other Ambulatory Visit (HOSPITAL_COMMUNITY): Payer: Self-pay

## 2022-10-29 ENCOUNTER — Other Ambulatory Visit (HOSPITAL_COMMUNITY): Payer: Self-pay | Admitting: Cardiology

## 2022-10-29 MED ORDER — SPIRONOLACTONE 25 MG PO TABS
12.5000 mg | ORAL_TABLET | Freq: Every day | ORAL | 3 refills | Status: DC
  Filled 2022-10-29: qty 45, 90d supply, fill #0
  Filled 2023-01-26 – 2023-01-28 (×2): qty 45, 90d supply, fill #1

## 2022-11-08 ENCOUNTER — Other Ambulatory Visit (HOSPITAL_COMMUNITY): Payer: Self-pay | Admitting: Cardiology

## 2022-11-08 ENCOUNTER — Other Ambulatory Visit (HOSPITAL_COMMUNITY): Payer: Self-pay

## 2022-11-08 MED ORDER — LOSARTAN POTASSIUM 25 MG PO TABS
25.0000 mg | ORAL_TABLET | Freq: Every day | ORAL | 3 refills | Status: DC
  Filled 2022-11-08: qty 90, 90d supply, fill #0
  Filled 2023-02-14: qty 90, 90d supply, fill #1
  Filled 2023-05-04: qty 90, 90d supply, fill #2
  Filled 2023-08-04: qty 90, 90d supply, fill #3

## 2022-11-08 MED ORDER — METOPROLOL SUCCINATE ER 50 MG PO TB24
50.0000 mg | ORAL_TABLET | Freq: Two times a day (BID) | ORAL | 1 refills | Status: DC
  Filled 2022-11-08: qty 180, 90d supply, fill #0
  Filled 2023-01-30 – 2023-02-14 (×2): qty 180, 90d supply, fill #1

## 2022-11-17 ENCOUNTER — Other Ambulatory Visit (HOSPITAL_COMMUNITY): Payer: Self-pay

## 2022-12-07 DIAGNOSIS — L570 Actinic keratosis: Secondary | ICD-10-CM | POA: Diagnosis not present

## 2022-12-07 DIAGNOSIS — L821 Other seborrheic keratosis: Secondary | ICD-10-CM | POA: Diagnosis not present

## 2022-12-07 DIAGNOSIS — D692 Other nonthrombocytopenic purpura: Secondary | ICD-10-CM | POA: Diagnosis not present

## 2022-12-07 DIAGNOSIS — L817 Pigmented purpuric dermatosis: Secondary | ICD-10-CM | POA: Diagnosis not present

## 2022-12-07 DIAGNOSIS — Z85828 Personal history of other malignant neoplasm of skin: Secondary | ICD-10-CM | POA: Diagnosis not present

## 2022-12-07 DIAGNOSIS — L814 Other melanin hyperpigmentation: Secondary | ICD-10-CM | POA: Diagnosis not present

## 2022-12-07 DIAGNOSIS — D225 Melanocytic nevi of trunk: Secondary | ICD-10-CM | POA: Diagnosis not present

## 2022-12-11 IMAGING — MG DIGITAL DIAGNOSTIC BILAT W/ TOMO W/ CAD
6 of 12 series · 6 of 36 positions shown · non-contrast
Comparison: None available.

CLINICAL DATA: Palpable lump in the left retroareolar region

EXAM:
DIGITAL DIAGNOSTIC BILATERAL MAMMOGRAM WITH TOMOSYNTHESIS AND CAD
TECHNIQUE: Bilateral digital diagnostic mammography and breast tomosynthesis
was performed. The images were evaluated with computer-aided
detection.

[L MLO synth-2D (1 of 2)]
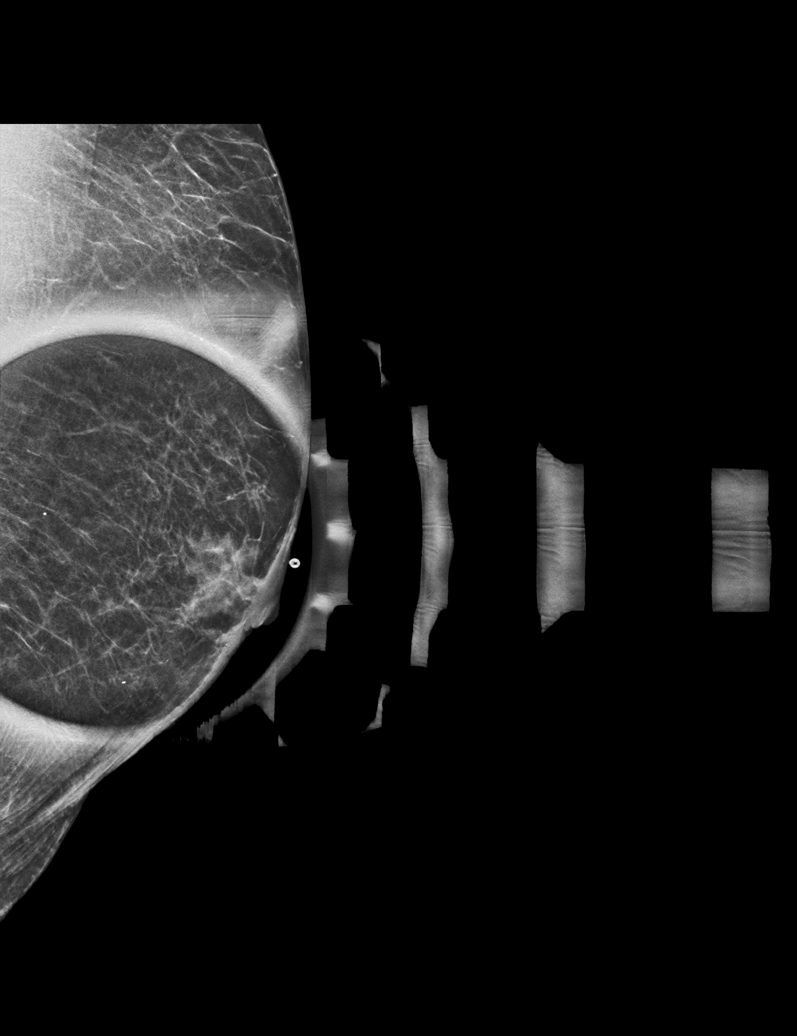

[R MLO synth-2D]
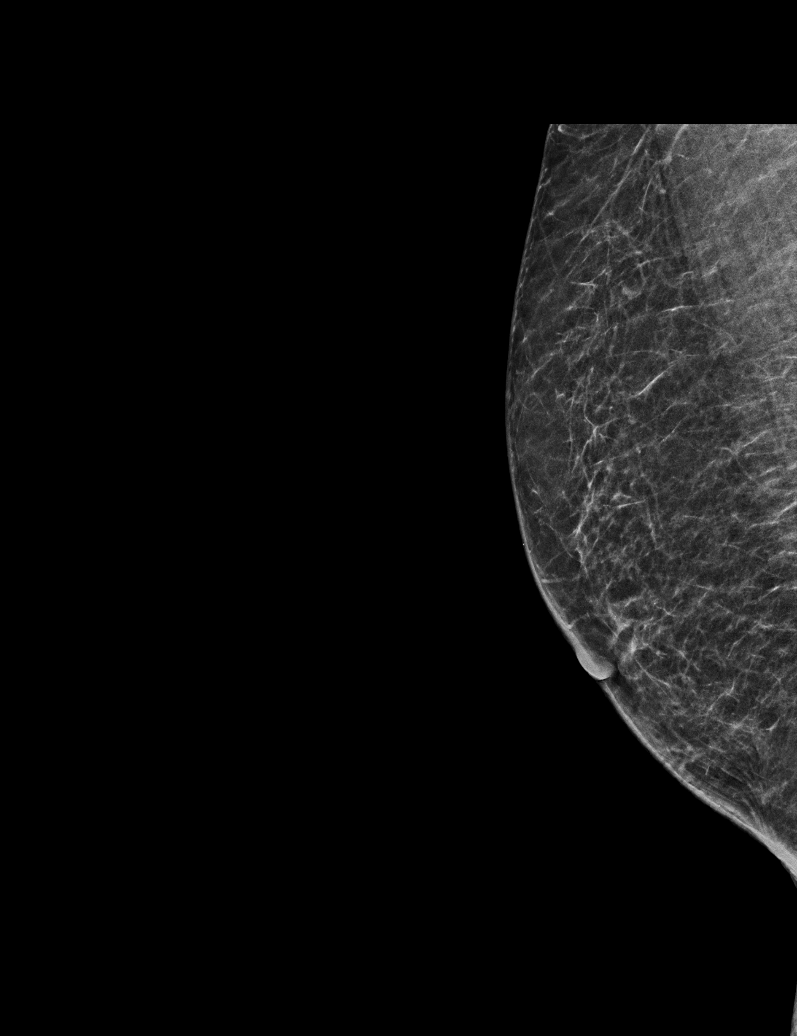

[L CC synth-2D (1 of 2)]
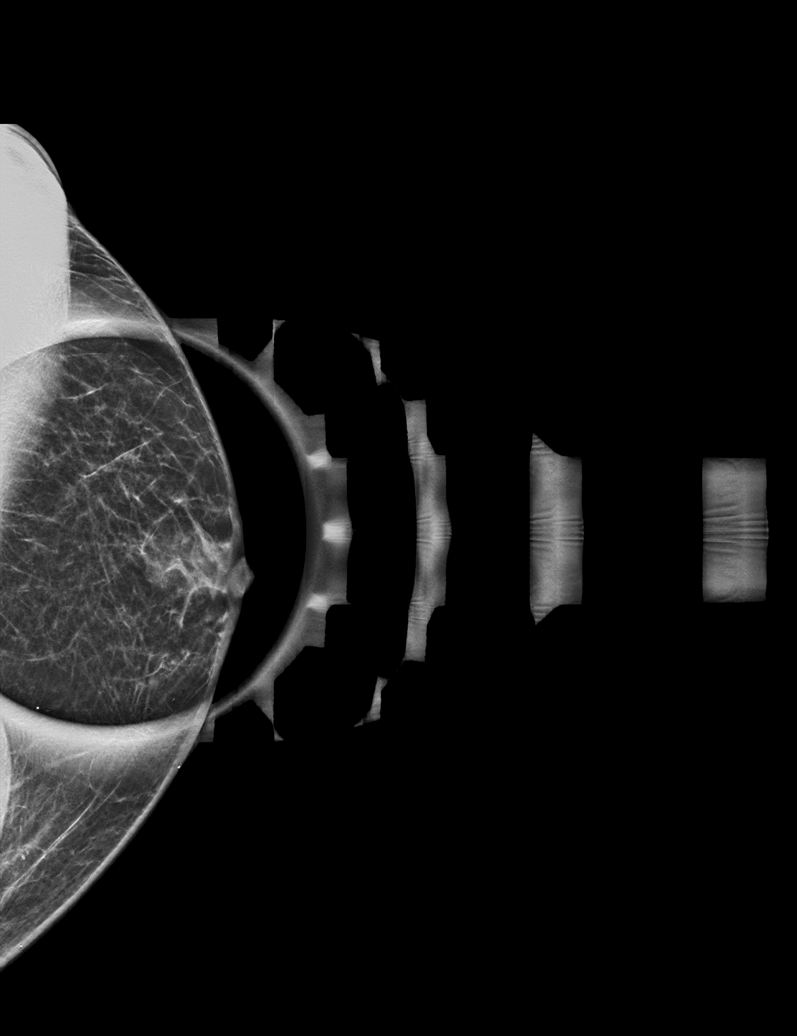

[R CC synth-2D]
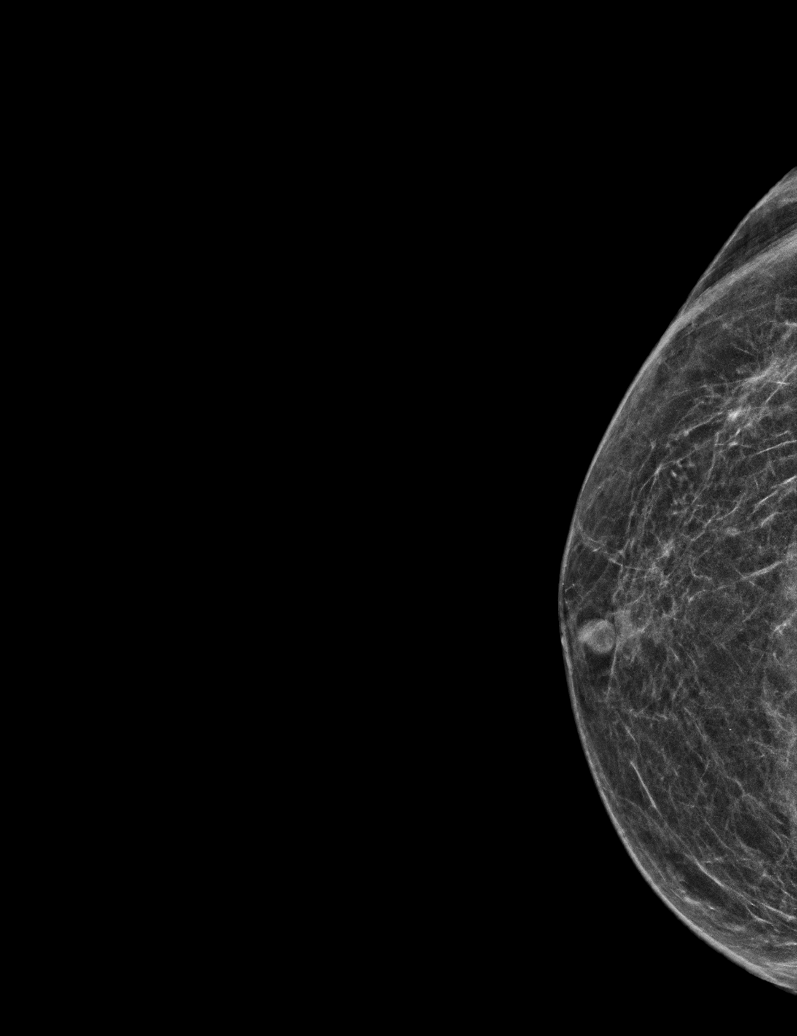

[L CC synth-2D (2 of 2)]
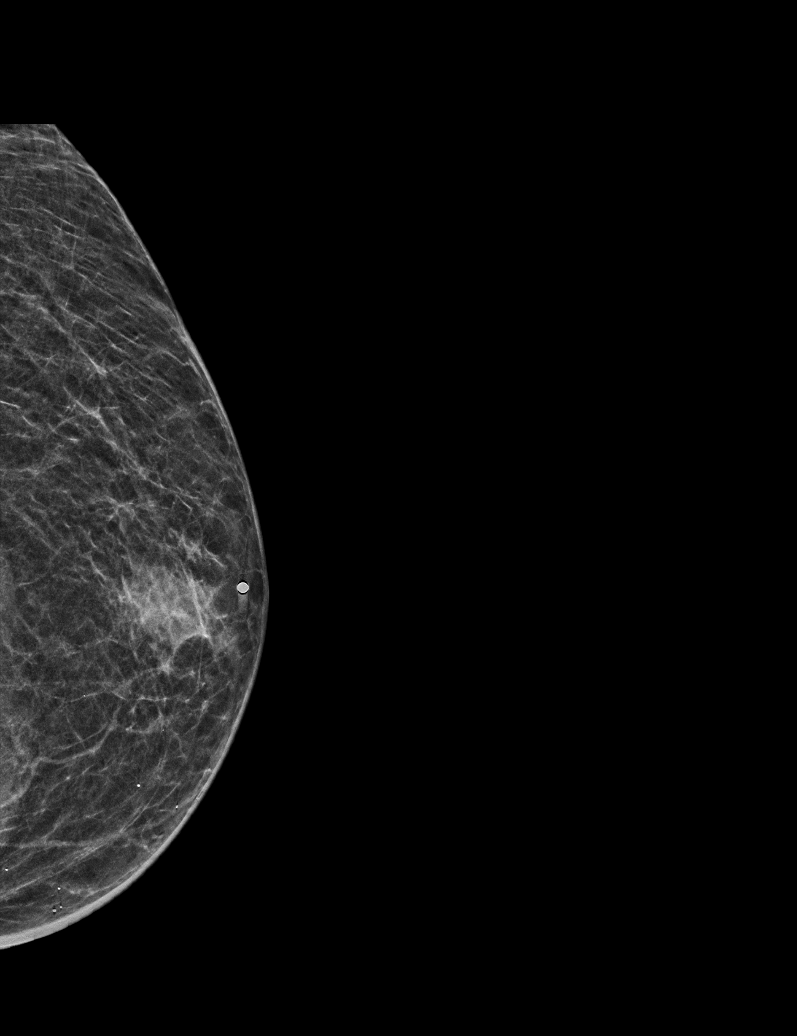

[L MLO synth-2D (2 of 2)]
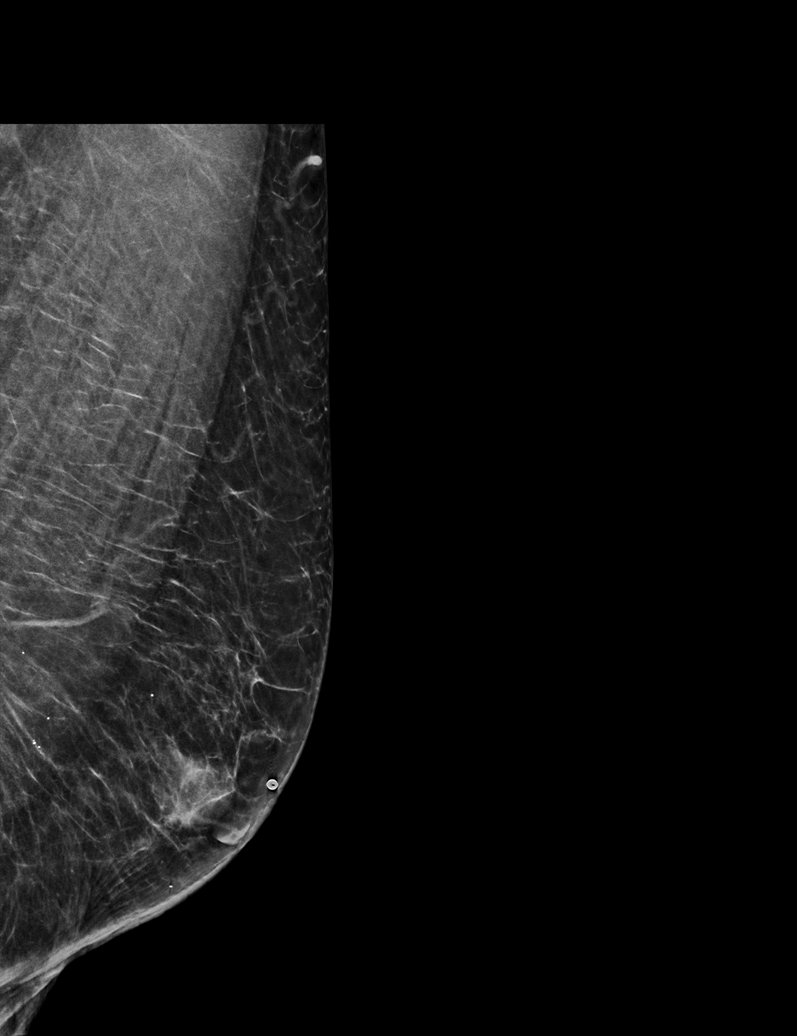

[6 of 36 positions shown; findings below may reference images not displayed]

ACR Breast Density Category b: There are scattered areas of
fibroglandular density.
FINDINGS: The patient is palpating gynecomastia, best evaluated on the spot
MLO view. No evidence of malignancy in either breast.
IMPRESSION: The patient is palpating gynecomastia. No evidence of malignancy in
either breast.

RECOMMENDATION:
Treatment of the gynecomastia should be based on clinical and
physical exam.

I have discussed the findings and recommendations with the patient.
If applicable, a reminder letter will be sent to the patient
regarding the next appointment.

BI-RADS CATEGORY  2: Benign.

## 2022-12-23 ENCOUNTER — Other Ambulatory Visit (HOSPITAL_COMMUNITY): Payer: Self-pay

## 2023-01-28 ENCOUNTER — Other Ambulatory Visit (HOSPITAL_COMMUNITY): Payer: Self-pay

## 2023-01-28 DIAGNOSIS — N5201 Erectile dysfunction due to arterial insufficiency: Secondary | ICD-10-CM | POA: Diagnosis not present

## 2023-01-28 DIAGNOSIS — D49511 Neoplasm of unspecified behavior of right kidney: Secondary | ICD-10-CM | POA: Diagnosis not present

## 2023-02-10 ENCOUNTER — Other Ambulatory Visit (HOSPITAL_COMMUNITY): Payer: Self-pay

## 2023-02-15 DIAGNOSIS — H532 Diplopia: Secondary | ICD-10-CM | POA: Diagnosis not present

## 2023-02-17 DIAGNOSIS — I361 Nonrheumatic tricuspid (valve) insufficiency: Secondary | ICD-10-CM | POA: Diagnosis not present

## 2023-02-17 DIAGNOSIS — I4821 Permanent atrial fibrillation: Secondary | ICD-10-CM | POA: Diagnosis not present

## 2023-02-17 DIAGNOSIS — Z905 Acquired absence of kidney: Secondary | ICD-10-CM | POA: Diagnosis not present

## 2023-02-17 DIAGNOSIS — Z7901 Long term (current) use of anticoagulants: Secondary | ICD-10-CM | POA: Diagnosis not present

## 2023-02-17 DIAGNOSIS — Z79899 Other long term (current) drug therapy: Secondary | ICD-10-CM | POA: Diagnosis not present

## 2023-02-17 DIAGNOSIS — Z95818 Presence of other cardiac implants and grafts: Secondary | ICD-10-CM | POA: Diagnosis not present

## 2023-02-17 DIAGNOSIS — I5032 Chronic diastolic (congestive) heart failure: Secondary | ICD-10-CM | POA: Diagnosis not present

## 2023-02-17 DIAGNOSIS — I081 Rheumatic disorders of both mitral and tricuspid valves: Secondary | ICD-10-CM | POA: Diagnosis not present

## 2023-02-17 DIAGNOSIS — Z952 Presence of prosthetic heart valve: Secondary | ICD-10-CM | POA: Diagnosis not present

## 2023-03-09 ENCOUNTER — Other Ambulatory Visit (HOSPITAL_COMMUNITY): Payer: Self-pay

## 2023-03-09 ENCOUNTER — Encounter (HOSPITAL_COMMUNITY): Payer: Self-pay | Admitting: Cardiology

## 2023-03-09 ENCOUNTER — Ambulatory Visit (HOSPITAL_COMMUNITY)
Admission: RE | Admit: 2023-03-09 | Discharge: 2023-03-09 | Disposition: A | Discharge status: Home / Self Care | Payer: Medicare PPO | Source: Ambulatory Visit | Attending: Cardiology | Admitting: Cardiology

## 2023-03-09 VITALS — BP 110/70 | HR 62 | Wt 164.0 lb

## 2023-03-09 DIAGNOSIS — D509 Iron deficiency anemia, unspecified: Secondary | ICD-10-CM | POA: Diagnosis not present

## 2023-03-09 DIAGNOSIS — Z7984 Long term (current) use of oral hypoglycemic drugs: Secondary | ICD-10-CM | POA: Diagnosis not present

## 2023-03-09 DIAGNOSIS — I071 Rheumatic tricuspid insufficiency: Secondary | ICD-10-CM

## 2023-03-09 DIAGNOSIS — Z95818 Presence of other cardiac implants and grafts: Secondary | ICD-10-CM | POA: Diagnosis not present

## 2023-03-09 DIAGNOSIS — I482 Chronic atrial fibrillation, unspecified: Secondary | ICD-10-CM | POA: Insufficient documentation

## 2023-03-09 DIAGNOSIS — Z79899 Other long term (current) drug therapy: Secondary | ICD-10-CM | POA: Insufficient documentation

## 2023-03-09 DIAGNOSIS — I639 Cerebral infarction, unspecified: Secondary | ICD-10-CM | POA: Insufficient documentation

## 2023-03-09 DIAGNOSIS — I272 Pulmonary hypertension, unspecified: Secondary | ICD-10-CM | POA: Diagnosis not present

## 2023-03-09 DIAGNOSIS — Z9889 Other specified postprocedural states: Secondary | ICD-10-CM

## 2023-03-09 DIAGNOSIS — I081 Rheumatic disorders of both mitral and tricuspid valves: Secondary | ICD-10-CM | POA: Insufficient documentation

## 2023-03-09 DIAGNOSIS — Z7901 Long term (current) use of anticoagulants: Secondary | ICD-10-CM | POA: Diagnosis not present

## 2023-03-09 DIAGNOSIS — I5032 Chronic diastolic (congestive) heart failure: Secondary | ICD-10-CM | POA: Diagnosis not present

## 2023-03-09 MED ORDER — FUROSEMIDE 20 MG PO TABS
20.0000 mg | ORAL_TABLET | Freq: Every day | ORAL | 3 refills | Status: DC
  Filled 2023-03-09 – 2023-03-11 (×3): qty 90, 90d supply, fill #0
  Filled 2023-06-14: qty 90, 90d supply, fill #1
  Filled 2023-09-08: qty 90, 90d supply, fill #2
  Filled 2023-12-26: qty 90, 90d supply, fill #3

## 2023-03-09 MED ORDER — EMPAGLIFLOZIN 10 MG PO TABS
10.0000 mg | ORAL_TABLET | Freq: Every day | ORAL | 11 refills | Status: DC
  Filled 2023-03-09 – 2023-03-11 (×2): qty 30, 30d supply, fill #0
  Filled 2023-04-07: qty 30, 30d supply, fill #1
  Filled 2023-05-04: qty 30, 30d supply, fill #2
  Filled 2023-06-14: qty 30, 30d supply, fill #3
  Filled 2023-07-13: qty 30, 30d supply, fill #4
  Filled 2023-08-05: qty 30, 30d supply, fill #5
  Filled 2023-09-08: qty 30, 30d supply, fill #6
  Filled 2023-10-10: qty 30, 30d supply, fill #7
  Filled 2023-11-07: qty 30, 30d supply, fill #8
  Filled 2023-12-13: qty 30, 30d supply, fill #9
  Filled 2024-01-09: qty 30, 30d supply, fill #10
  Filled 2024-02-07: qty 30, 30d supply, fill #11

## 2023-03-09 MED ORDER — SPIRONOLACTONE 25 MG PO TABS
25.0000 mg | ORAL_TABLET | Freq: Every day | ORAL | 3 refills | Status: DC
  Filled 2023-03-09 – 2023-03-30 (×2): qty 90, 90d supply, fill #0
  Filled 2023-06-17: qty 90, 90d supply, fill #1
  Filled 2023-09-23: qty 90, 90d supply, fill #2
  Filled 2023-10-24 – 2023-12-26 (×2): qty 90, 90d supply, fill #3

## 2023-03-09 NOTE — Patient Instructions (Signed)
INCREASE Spironolactone to 25 mg daily.  Blood work in 10 days.  You have been referred to [facility]. They will call you to arrange your appointment.  Your provider has ordered Carotid dopplers for you.  You will be called to have this test arranged.  Your provider has ordered a MRI of your head. You will be called to have this test arranged.  Your physician recommends that you schedule a follow-up appointment in: 3 months ( January 2025) ** PLEASE CALL THE OFFICE IN MID NOVEMBER TO ARRANGE YOUR FOLLOW UP APPOINTMENT. **  If you have any questions or concerns before your next appointment please send [place] a message through [place] or call our office at [phone].    TO LEAVE A MESSAGE FOR THE NURSE SELECT OPTION 2, PLEASE LEAVE A MESSAGE INCLUDING: YOUR NAME DATE OF BIRTH CALL BACK NUMBER REASON FOR CALL**this is important as we prioritize the call backs  YOU WILL RECEIVE A CALL BACK THE SAME DAY AS LONG AS YOU CALL BEFORE 4:00 PM  At the [facility], you and your health needs are our priority. As part of our continuing mission to provide you with exceptional heart care, we have created designated Provider Care Teams. These Care Teams include your primary Cardiologist (physician) and Advanced Practice Providers (APPs- Physician Assistants and Nurse Practitioners) who all work together to provide you with the care you need, when you need it.   You may see any of the following providers on your designated Care Team at your next follow up: Dr Arvilla Meres Dr Marca Ancona Dr. Dorthula Nettles Dr. Clearnce Hasten Amy Filbert Schilder, NP Robbie Lis, [place] [facility] [place], [place] Brynda Peon, NP [place] Lee, NP Karle Plumber, PharmD   Please be sure to bring in all your medications bottles to every appointment.    Thank you for choosing [facility]

## 2023-03-09 NOTE — Progress Notes (Signed)
PCP: Geoffry Paradise, MD Cardiology: Dr. Elease Hashimoto HF Cardiology: Dr. Shirlee Latch  89 y.o. with history of chronic atrial fibrillation, diastolic CHF, pulmonary hypertension, [name] was referred by Dr. Elease Hashimoto for evaluation of CHF and pulmonary hypertension. Patient was found to be in atrial fibrillation back in 2017.  At that time, he had a cardioversion.  This did not hold, and he has been in atrial fibrillation since 2017 chronically as far as I can tell. He has had Fe deficiency anemia requiring Fe infusions, GI workup was unrevealing.  Also of note, he has cirrhosis by 9/21 MRI abdomen, cause uncertain (not a heavy drinker).   In the summer of 2022, his legs began to swell significantly and developed weeping sores.  He developed significant dyspnea.  He was seen in the cardiology clinic and started on Lasix.  He cut back on sodium.  Echo in 7/22 showed EF 60-65%, normal RV, PASP 71 mmHg, severe biatrial enlargement, severe TR, probably moderate MR, IVC dilated. Over time, his symptoms haves improved.   TEE in 9/22 showed severe TR (functional) and severe MR (prolapse/partial P3 flail).  LHC/RHC in 9/22 showed minimal CAD, mildly elevated PCWP.     Patient had Mitraclip placed in 10/22 but the device failed the day after with device only attached to one leaflet.  Patient had redo Mitraclip in 11/22, this time it was successful with new clip adjacent to original clip.  Echo in 12/22 showed EF 60-65%, D-shaped septum with moderate RV enlargement and mildly decreased systolic function, PASP 55 mmHg, severe biatrial enlargement, 2 Mitraclips with only 1 attached to both leaflets, mild MR with mean gradient 3 mmHg, severe TR, small iatrogenic secundum ASD, dilated IVC.   Echo in 3/23 showed EF 60-65%, D-shaped septum, mild RV dysfunction with mildly enlarged RV, PASP 50 mmHg, severe biatrial enlargement, mild MR s/p Mitraclip with mean gradient 4.5 mmHg, severe TR.   Patient had tTEER as part  of Triluminate study on 02/10/22.  2 clips were placed . Post-procedure echo in 9/23 showed EF 66%, severe RV dilation with low normal systolic function, 2 Mitraclips with mild-moderate MR, 2 Triclips with moderate TR, PASP 63 mmHg.   Echo at [facility] in 10/23 showed LVEF 62%, mild LVH, mild RV dilation with normal systolic function, mild MR with 2 mitraclips and mean gradient 4, moderate TR with 2 triclips, PASP 42 mmHg.  Cardiac MRI in 11/23 showed LV EF 73%, RV EF 53%, mild-moderate TR with regurgitant fraction 20%.   Echo in 10/24 showed LV EF 75%, moderate-severe TR with 2 Triclips, moderate MR with 2 Mitraclips mean gradient 3 mmHg, severe biatrial enlargement, mild RV dilation with mildly decreased RV systolic function.    Patient returns for followup of CHF and mitral regurgitation/tricuspid regurgitation.  No exertional dyspnea or chest pain.  He is playing tennis regularly.  No problems walking up stairs.  No lightheadedness.  No orthopnea/PND.  Main problem recently has been about 1 month of diplopia, this seems to have been fairly constant over the last month.  He had an episode of 6th nerve palsy 7-8 years ago but this has seemed somewhat different. He saw his ophthalmologist who raised concern for a CVA.   ECG (personally reviewed): atrial fibrillation, iRBBB  Labs (7/22): pro-BNP 1404 Labs (8/22): hgb 12.8, K 4.2, creatinine 1.11 Labs (9/22): K 4.5, creatinine 1.06 Labs (11/22): K 4.9, creatinine 1.04, hgb 12.1 Labs (12/22): K 4.2, creatinine 1.14, BNP 318 Labs (3/23): K 4.1, creatinine  1.12, BNP 292 Labs (9/23): K 4.3, creatinine 1.13 Labs (10/23): K 4.9, creatinine 1.22 Labs (10/24): K 4.3, creatinine 1.02, NT-proBNP 1457, hgb 15.7  PMH: 1. Pulmonary hypertension: Echo in 7/22 with PASP 71 mmHg.  - V/Q scan (8/22): no evidence for chronic PE.  - PFTs (8/22): Minimal airways obstruction - RHC (9/22) showed pulmonary venous hypertension.  2. Fe deficiency anemia: He had extensive GI  evaluation in the past that was unrevealing.  Followed by Dr. Darnelle Catalan, has had Fe infusions.  3. Chronic atrial fibrillation: Diagnosed in 2017, failed DCCV in 2017 and appears to have been in atrial fibrillation since then.  4. Cirrhosis: 9/21 abdominal MRI showed cirrhosis.  No history of heavy ETOH, cause uncertain.  5. Chronic diastolic CHF: Echo (7/22) with EF 60-65%, normal RV, PASP 71 mmHg, severe biatrial enlargement, severe TR, probably moderate MR, IVC dilated.  6. Valvular heart disease: Echo in 7/22 with probably moderate MR, severe TR.  Suspect functional due to AF/dilated atria.  - TEE (9/22): EF 55-60%, RV moderately dilated with normal systolic function, moderate LAE, severe RAE, severe TR (functional) with peak RV-RA gradient 45 mmHg, severe highly eccentric mitral regurgitation with prolapse and partial flail of the P3 segment of the posterior leaflet.   - LHC/RHC (9/22): Nonobstructive mild CAD; mean RA 6, PA 47/15 mean 31, mean PCWP 15 with v-waves to 32, CI 3.32, PVR 2.5 WU.  - Failed Mitraclip in 10/22, successful Mitraclip in 11/22.  - Echo (12/22): EF 60-65%, D-shaped septum with moderate RV enlargement and mildly decreased systolic function, PASP 55 mmHg, severe biatrial enlargement, 2 Mitraclips with only 1 attached to both leaflets, mild MR with mean gradient 3 mmHg, severe TR, small iatrogenic secundum ASD, dilated IVC.  - Echo (3/23): EF 60-65%, D-shaped septum, mild RV dysfunction with mildly enlarged RV, PASP 50 mmHg, severe biatrial enlargement, mild MR s/p Mitraclip with mean gradient 4.5 mmHg, severe TR.  - tTEER as part of Triluminate study in 9/23.  - Post-tTEER echo in 9/23: EF 66%, severe RV dilation with low normal systolic function, 2 Mitraclips with mild-moderate MR, 2 Triclips with moderate TR, PASP 63 mmHg.  - Echo (10/23, [facility]): LVEF 62%, mild LVH, mild RV dilation with normal systolic function, mild MR with 2 mitraclips and mean gradient 4, moderate TR with 2  triclips, PASP 42 mmHg.   - Cardiac MRI (11/23, [facility]): LV EF 73%, RV EF 53%, mild-moderate TR with regurgitant fraction 20%.  - Echo (10/24): LV EF 75%, moderate-severe TR with 2 Triclips, moderate MR with 2 Mitraclips mean gradient 3 mmHg, severe biatrial enlargement, mild RV dilation with mildly decreased RV systolic function.   7. Left renal mass: S/p left nephrectomy.  8. T-spine compression fracture.  9. Melanoma 10. H/o 6th nerve palsy  SH: Widower, occasional ETOH (not heavy), nonsmoker.  Retired Clinical research associate.  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  [employer] graduate.   Family History  Problem Relation Age of Onset   Breast cancer Mother 58   Hypertension Father    Diabetes Neg Hx    Stroke Neg Hx    CAD Neg Hx    Colon cancer Neg Hx    Colon polyps Neg Hx    Esophageal cancer Neg Hx    Rectal cancer Neg Hx    Stomach cancer Neg Hx    ROS: All systems reviewed and negative except as per HPI.   Current Outpatient Medications  Medication Sig Dispense Refill  ALPRAZolam (XANAX) 0.25 MG tablet Take 1 tablet (0.25 mg total) by mouth every 6 (six) hours as needed FOR ANXIETY 30 tablet 0   apixaban (ELIQUIS) 5 MG TABS tablet TAKE 1 TABLET BY MOUTH TWICE DAILY 180 tablet 3   Apoaequorin (PREVAGEN EXTRA STRENGTH PO) Take 1 tablet by mouth daily.     Barberry-Oreg Grape-Goldenseal (BERBERINE COMPLEX PO) Take 2,400 mg by mouth daily.     Cholecalciferol 50 MCG (2000 UT) CAPS take one capsule Orally daily     Chromium 1000 MCG TABS Take 1,000 mcg by mouth daily. chromium picolinate     Coenzyme Q10 (CO Q10) 200 MG CAPS Take 200 mg by mouth daily.     ferrous gluconate (FERGON) 324 MG tablet Take 324 mg by mouth 3 (three) times a week.     losartan (COZAAR) 25 MG tablet Take 1 tablet (25 mg total) by mouth daily. 90 tablet 3   MAGNESIUM GLYCINATE PO Take 525 mg by mouth in the morning and at bedtime.     Melatonin 10 MG TABS Take 10 mg by mouth at bedtime.     metoprolol  succinate (TOPROL-XL) 50 MG 24 hr tablet Take 1 tablet (50 mg total) by mouth 2 (two) times daily. Take with or immediately following a meal 180 tablet 1   Misc Natural Products (ADVANCED JOINT RELIEF PO) Take 1 tablet by mouth 2 (two) times daily.     Multiple Vitamin (MULTIVITAMIN) capsule Take 1 capsule by mouth daily. Mega men 50 plus     Multiple Vitamins-Minerals (ZINC PO) Take by mouth daily.     NON FORMULARY Take 300 mg by mouth 2 (two) times daily. TRU vitamin  Nigen     omeprazole (PRILOSEC) 20 MG capsule Take 1 capsule (20 mg total) by mouth daily. 90 capsule 3   OVER THE COUNTER MEDICATION Take 2 capsules by mouth daily. Prostacare     OVER THE COUNTER MEDICATION Take 1 capsule by mouth 2 (two) times daily. Uricare Himalaya     OVER THE COUNTER MEDICATION Take 1 tablet by mouth 2 (two) times daily. virectin     OVER THE COUNTER MEDICATION Take 1 tablet by mouth 2 (two) times daily. Liver care     OVER THE COUNTER MEDICATION Take 1 tablet by mouth 2 (two) times daily. Tri Flex     OVER THE COUNTER MEDICATION Take 1 capsule by mouth 2 (two) times daily. conjugated linoleic acid     OVER THE COUNTER MEDICATION Take 1 tablet by mouth daily in the afternoon. CDB Gummies     OVER THE COUNTER MEDICATION Take 1 capsule by mouth daily in the afternoon. Hemp oil gummies     OVER THE COUNTER MEDICATION Take 1 tablet by mouth daily. arthrozene     Polyethyl Glycol-Propyl Glycol (SYSTANE) 0.4-0.3 % SOLN 1 drop daily as needed (as needed for dry eye).     Sodium Hyaluronate, oral, (HYALURONIC ACID) 100 MG CAPS Take 100 mg by mouth 2 (two) times daily.     tadalafil (CIALIS) 20 MG tablet TAKE ONE TABLET BY MOUTH DAILY AS NEEDED 30 tablet 11   empagliflozin (JARDIANCE) 10 MG TABS tablet Take 1 tablet (10 mg total) by mouth daily before breakfast. 30 tablet 11   furosemide (LASIX) 20 MG tablet Take 1 tablet (20 mg total) by mouth daily. 90 tablet 3   spironolactone (ALDACTONE) 25 MG tablet Take 1  tablet (25 mg total) by mouth daily. 90 tablet 3  No current facility-administered medications for this encounter.   BP 110/70   Pulse 62   Wt 74.4 kg (164 lb)   SpO2 96%   BMI 23.53 kg/m  General: NAD Neck: No JVD, no thyromegaly or thyroid nodule.  Lungs: Clear to auscultation bilaterally with normal respiratory effort. CV: Nondisplaced PMI.  Heart irregular S1/S2, no S3/S4, 1/6 HSM apex.  No peripheral edema.  No carotid bruit.  Normal pedal pulses.  Abdomen: Soft, nontender, no hepatosplenomegaly, no distention.  Skin: Intact without lesions or rashes.  Neurologic: Alert and oriented x 3. Extraocular muscles appear intact Psych: Normal affect. Extremities: No clubbing or cyanosis.  HEENT: Normal.   Assessment/Plan: 1. Chronic diastolic CHF: Echo in 7/22 with EF 60-65%, normal RV, PASP 71 mmHg, severe biatrial enlargement, severe TR, probably moderate MR, IVC dilated.  In the summer of 2022, he developed symptomatic CHF, this has improved with diuresis and sodium restriction.  TEE showed severe functional TR and severe primary mitral regurgitation with partial flail posterior leaflet (P3).  I suspect that CHF is due to chronic atrial fibrillation + valvular disease (MR, TR).  He is now s/p Mitraclip, last echo in 12/22 showed EF 60-65%, D-shaped septum with moderate RV enlargement and mildly decreased systolic function, PASP 55 mmHg, severe biatrial enlargement, 2 Mitraclips with only 1 attached to both leaflets, mild MR with mean gradient 3 mmHg, severe TR, small iatrogenic secundum ASD, dilated IVC. Echo in 3/23 showed EF 60-65%, D-shaped septum, mild RV dysfunction with mildly enlarged RV, PASP 50 mmHg, severe biatrial enlargement, mild MR s/p Mitraclip with mean gradient 4.5 mmHg, severe TR.  Patient then had tTEER in 9/23. Post-op echo showed EF 66%, severe RV dilation with low normal systolic function, 2 Mitraclips with mild-moderate MR, 2 Triclips with moderate TR, PASP 63 mmHg.   Echo at [facility] in 10/23 showed LVEF 62%, mild LVH, mild RV dilation with normal systolic function, mild MR with 2 mitraclips and mean gradient 4, moderate TR with 2 triclips, PASP 42 mmHg.  Cardiac MRI in 11/23 showed LV EF 73%, RV EF 53%, mild-moderate TR with regurgitant fraction 20%. Echo in 10/24 showed LV EF 75%, moderate-severe TR with 2 Triclips, moderate MR with 2 Mitraclips mean gradient 3 mmHg, severe biatrial enlargement, mild RV dilation with mildly decreased RV systolic function.  He describes minimal exertional symptoms currently, NYHA class I-II. Not volume overloaded on exam though recent NT-pro-BNP was higher.  - Continue Lasix 20 mg daily.   - Continue empagliflozin 10 mg daily.    - Increase spironolactone to 25 mg daily.  BMET 10 days.  2. Pulmonary hypertension: Pulmonary venous hypertension by 9/22 RHC.  PASP was 63 on post-tTEER echo and 42 mmHg on 10/23 echo.  This is improving, but he could have some residual PH due to pulmonary vascular remodeling in the setting of long-standing valvular disease.  3. Atrial fibrillation: This is chronic and likely permanent (present since 2017).  The atria are severely dilated on echo. I do not think that he would be likely to hold NSR with DCCV, even with anti-arrhythmic.  - Continue Eliquis.  - Good rate control on current Toprol XL.  4. Valvular heart disease: He is s/p Mitraclip with 3/23 echo showing mild MR and mild MS, but he still had severe TR.  Symptomatically improved s/p Mitraclip.  With severe TR and RV changes already noted on echo (D-shaped septum, mild RV enlargement and hypokinesis), I was concerned that he could develop progressive RV  failure over time. He then had tTEER with 2 Triclips in 9/23 as part of Triluminate trial.  Most recent echo in 10/24 showed moderate MR s/p 2 Mitraclips and somewhat worse TR (moderate-severe) with 2 Triclips.  5. Diplopia: This has lasted several weeks.  Given history of atrial fibrillation, I am  concerned for possible CVA. He has been compliant with Eliquis.  - I will arrange for head MRI to assess for CVA.  - I will check carotid dopplers.  - I will refer to neurology.   Followup in 3 months  Marca Ancona 03/09/2023

## 2023-03-10 ENCOUNTER — Encounter: Payer: Self-pay | Admitting: Oncology

## 2023-03-10 ENCOUNTER — Other Ambulatory Visit (HOSPITAL_COMMUNITY): Payer: Self-pay

## 2023-03-11 ENCOUNTER — Other Ambulatory Visit (HOSPITAL_COMMUNITY): Payer: Self-pay

## 2023-03-11 DIAGNOSIS — Z125 Encounter for screening for malignant neoplasm of prostate: Secondary | ICD-10-CM | POA: Diagnosis not present

## 2023-03-11 DIAGNOSIS — Z1389 Encounter for screening for other disorder: Secondary | ICD-10-CM | POA: Diagnosis not present

## 2023-03-11 DIAGNOSIS — R82998 Other abnormal findings in urine: Secondary | ICD-10-CM | POA: Diagnosis not present

## 2023-03-11 DIAGNOSIS — D649 Anemia, unspecified: Secondary | ICD-10-CM | POA: Diagnosis not present

## 2023-03-11 DIAGNOSIS — Z1212 Encounter for screening for malignant neoplasm of rectum: Secondary | ICD-10-CM | POA: Diagnosis not present

## 2023-03-11 DIAGNOSIS — I10 Essential (primary) hypertension: Secondary | ICD-10-CM | POA: Diagnosis not present

## 2023-03-11 DIAGNOSIS — R7301 Impaired fasting glucose: Secondary | ICD-10-CM | POA: Diagnosis not present

## 2023-03-14 DIAGNOSIS — D696 Thrombocytopenia, unspecified: Secondary | ICD-10-CM | POA: Diagnosis not present

## 2023-03-14 DIAGNOSIS — R7301 Impaired fasting glucose: Secondary | ICD-10-CM | POA: Diagnosis not present

## 2023-03-14 DIAGNOSIS — I272 Pulmonary hypertension, unspecified: Secondary | ICD-10-CM | POA: Diagnosis not present

## 2023-03-14 DIAGNOSIS — R972 Elevated prostate specific antigen [PSA]: Secondary | ICD-10-CM | POA: Diagnosis not present

## 2023-03-14 DIAGNOSIS — Z1339 Encounter for screening examination for other mental health and behavioral disorders: Secondary | ICD-10-CM | POA: Diagnosis not present

## 2023-03-14 DIAGNOSIS — Z0000 Encounter for general adult medical examination without abnormal findings: Secondary | ICD-10-CM | POA: Diagnosis not present

## 2023-03-14 DIAGNOSIS — I509 Heart failure, unspecified: Secondary | ICD-10-CM | POA: Diagnosis not present

## 2023-03-14 DIAGNOSIS — I482 Chronic atrial fibrillation, unspecified: Secondary | ICD-10-CM | POA: Diagnosis not present

## 2023-03-14 DIAGNOSIS — Z1331 Encounter for screening for depression: Secondary | ICD-10-CM | POA: Diagnosis not present

## 2023-03-14 DIAGNOSIS — D6869 Other thrombophilia: Secondary | ICD-10-CM | POA: Diagnosis not present

## 2023-03-14 DIAGNOSIS — I110 Hypertensive heart disease with heart failure: Secondary | ICD-10-CM | POA: Diagnosis not present

## 2023-03-17 ENCOUNTER — Ambulatory Visit: Payer: Medicare PPO | Admitting: Neurology

## 2023-03-17 ENCOUNTER — Encounter (HOSPITAL_COMMUNITY): Payer: Medicare PPO

## 2023-03-17 ENCOUNTER — Encounter: Payer: Self-pay | Admitting: Neurology

## 2023-03-17 ENCOUNTER — Ambulatory Visit (HOSPITAL_COMMUNITY)
Admission: RE | Admit: 2023-03-17 | Discharge: 2023-03-17 | Disposition: A | Discharge status: Home / Self Care | Payer: Medicare PPO | Source: Ambulatory Visit | Attending: Cardiology | Admitting: Cardiology

## 2023-03-17 VITALS — BP 127/73 | HR 64 | Ht 70.5 in | Wt 163.5 lb

## 2023-03-17 DIAGNOSIS — H532 Diplopia: Secondary | ICD-10-CM | POA: Diagnosis not present

## 2023-03-17 DIAGNOSIS — I639 Cerebral infarction, unspecified: Secondary | ICD-10-CM | POA: Diagnosis not present

## 2023-03-17 NOTE — Progress Notes (Addendum)
Chief Complaint  Patient presents with   New Patient (Initial Visit)    Rm, 16, alone, NP internal referral-concern for CVA: double vision 2 months, former LOVE pt diagnosed 6 nerve palsy       ASSESSMENT AND PLAN  [name] is a 89 y.o. 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  DIAGNOSTIC DATA (LABS, IMAGING, TESTING) - I reviewed patient records, labs, notes, testing and imaging myself where available.   MEDICAL HISTORY:  John Holder, is a 89 year old male seen in request by his primary care from [facility] Dr. Jacky Kindle, Gerlene Burdock, for evaluation of double vision, initial evaluation was on April 06, 2023  History is obtained from the patient and review of electronic medical records. I personally reviewed pertinent available imaging films in PACS.   PMHx of  Left 6th nerve palsy. A fib, eliquis HTN CHF MVR, in 2022, TVR in 2023, Dr. Excell Seltzer,   He is already on Eliquis because of history of atrial fibrillation, lives at home with his partner, still very physically active, walk regularly, play tennis twice a week, in August 2024, woke up in the morning, he noticed double vision, was seen by ophthalmologist, concerning ischemic cranial neuropathy, referred to neurology for further evaluation  Over the past few  months, his double vision has improved, this week, sometimes he woke up with no double vision, occasionally transient double vision,  He denies ptosis, no dysarthria, no dysphagia, no limb muscle weakness denies headaches, or body achy pain   PHYSICAL EXAM:   Vitals:   03/17/23 0855  BP: 127/73  Pulse: 64  Weight: 163 lb 8 oz (74.2 kg)  Height: 5' 10.5" (1.791 m)   Not recorded     Body mass index is 23.13 kg/m.  PHYSICAL EXAMNIATION:  Gen: NAD, conversant, well nourised, well groomed                     Cardiovascular: Regular rate rhythm, no peripheral edema, warm, nontender. Eyes: Conjunctivae clear without exudates or hemorrhage Neck: Supple, no carotid bruits. Pulmonary: Clear to auscultation bilaterally   NEUROLOGICAL EXAM:  MENTAL STATUS: Speech/cognition: Awake, alert, oriented to history taking and casual conversation CRANIAL NERVES: CN II: Visual fields are full to confrontation. Pupils are round equal and briskly reactive to light. CN III, IV, VI: extraocular movement are normal. No ptosis.  Cover and uncover showed bilateral exophoria, right lens testing showed weakness of left extraocular muscles and left upper and lower gaze. CN V: Facial sensation is intact to light touch CN VII: Face is symmetric with normal eye closure  CN VIII: Hearing is normal to causal conversation. CN IX, X: Phonation is normal. CN XI: Head turning and shoulder shrug are intact  MOTOR: There is no pronator drift of out-stretched arms. Muscle bulk and tone are normal. Muscle strength is normal.  REFLEXES: Reflexes  are 1 and symmetric at the biceps, triceps, knees, and ankles. Plantar responses are flexor.  SENSORY: Mildly length-dependent decreased light touch light touch, pinprick and vibratory sensation  COORDINATION: There is no trunk or limb dysmetria noted.  GAIT/STANCE: Posture is normal. Gait is steady   REVIEW OF SYSTEMS:  Full 14 system review of systems  performed and notable only for as above All other review of systems were negative.   ALLERGIES: No Known Allergies  HOME MEDICATIONS: Current Outpatient Medications  Medication Sig Dispense Refill   ALPRAZolam (XANAX) 0.25 MG tablet Take 1 tablet (0.25 mg total) by mouth every 6 (six) hours as needed FOR ANXIETY 30 tablet 0   apixaban (ELIQUIS) 5 MG TABS tablet TAKE 1 TABLET BY MOUTH TWICE DAILY 180 tablet 3   Apoaequorin (PREVAGEN EXTRA STRENGTH PO) Take 1 tablet by mouth daily.     Barberry-Oreg Grape-Goldenseal (BERBERINE COMPLEX PO) Take 2,400 mg by mouth daily.     Cholecalciferol 50 MCG (2000 UT) CAPS take one capsule Orally daily     Chromium 1000 MCG TABS Take 1,000 mcg by mouth daily. chromium picolinate     Coenzyme Q10 (CO Q10) 200 MG CAPS Take 200 mg by mouth daily.     empagliflozin (JARDIANCE) 10 MG TABS tablet Take 1 tablet (10 mg total) by mouth daily before breakfast. 30 tablet 11   ferrous gluconate (FERGON) 324 MG tablet Take 324 mg by mouth 3 (three) times a week.     furosemide (LASIX) 20 MG tablet Take 1 tablet (20 mg total) by mouth daily. 90 tablet 3   losartan (COZAAR) 25 MG tablet Take 1 tablet (25 mg total) by mouth daily. 90 tablet 3   MAGNESIUM GLYCINATE PO Take 525 mg by mouth in the morning and at bedtime.     Melatonin 10 MG TABS Take 10 mg by mouth at bedtime.     metoprolol succinate (TOPROL-XL) 50 MG 24 hr tablet Take 1 tablet (50 mg total) by mouth 2 (two) times daily. Take with or immediately following a meal 180 tablet 1   Misc Natural Products (ADVANCED JOINT RELIEF PO) Take 1 tablet by mouth 2 (two) times daily.     Multiple Vitamin (MULTIVITAMIN) capsule Take 1 capsule by mouth daily. Mega men 50 plus     Multiple Vitamins-Minerals (ZINC PO) Take by mouth daily.     NON FORMULARY Take 300 mg by mouth 2 (two) times daily. TRU vitamin  Nigen     omeprazole (PRILOSEC) 20 MG capsule Take 1 capsule (20 mg total) by mouth daily. 90 capsule 3   OVER THE  COUNTER MEDICATION Take 2 capsules by mouth daily. Prostacare     OVER THE COUNTER MEDICATION Take 1 capsule by mouth 2 (two) times daily. Uricare Himalaya     OVER THE COUNTER MEDICATION Take 1 tablet by mouth 2 (two) times daily. virectin     OVER THE COUNTER MEDICATION Take 1 tablet by mouth 2 (two) times daily. Liver care     OVER THE COUNTER MEDICATION Take 1 tablet by mouth 2 (two) times daily. Tri Flex     OVER THE COUNTER MEDICATION Take 1 capsule by mouth 2 (two) times daily. conjugated linoleic acid     OVER THE COUNTER MEDICATION Take 1 tablet by mouth daily in the afternoon. CDB Gummies     OVER THE COUNTER MEDICATION Take 1 capsule by mouth daily in the afternoon. Hemp oil gummies     OVER THE COUNTER  MEDICATION Take 1 tablet by mouth daily. arthrozene     Polyethyl Glycol-Propyl Glycol (SYSTANE) 0.4-0.3 % SOLN 1 drop daily as needed (as needed for dry eye).     Sodium Hyaluronate, oral, (HYALURONIC ACID) 100 MG CAPS Take 100 mg by mouth 2 (two) times daily.     spironolactone (ALDACTONE) 25 MG tablet Take 1 tablet (25 mg total) by mouth daily. 90 tablet 3   tadalafil (CIALIS) 20 MG tablet TAKE ONE TABLET BY MOUTH DAILY AS NEEDED 30 tablet 11   No current facility-administered medications for this visit.    PAST MEDICAL HISTORY: Past Medical History:  Diagnosis Date   Blood transfusion without reported diagnosis    Cataract    removed in replace bilateral   CHF (congestive heart failure) (HCC)    Dysrhythmia    GERD (gastroesophageal reflux disease)    Hypertension    Iron deficiency anemia    amdx 08/2018 >> Tx with PRBCs and Feraheme started; no GI source per workup; followed by Heme (Dr. Darnelle Catalan) and GI (Dr. Matthias Hughs)    Left renal mass    s/p L nephrectomy   Mitral regurgitation    Echo 02/2016:  Mild conc LVH, EF 55-60, mild to mod MR, mod LAE, mod to severe RAE, mod TR, PASP 41 // Echo 10/2018: EF 60-65, mod conc LVH, severe BAE, trivial eff, mild MVP with mod MR, mod  to severe TR, asc aorta 36 (mild dilation)    Persistent Atrial Fibrillation    Pulmonary hypertension (HCC)    Echocardiogram 7/22: EF 60-65, no RWMA, normal RVSF, RVSP 70.7 (severe pulmonary hypertension), severe BAE, mod MR, severe TR, mild to mod AV sclerosis w/o AS   S/P mitral valve clip implantation 02/26/2021   G4 NT W device; A3/P3 with Dr. Excell Seltzer    PAST SURGICAL HISTORY: Past Surgical History:  Procedure Laterality Date   CARDIAC CATHETERIZATION     CARDIOVERSION N/A 02/04/2016   Procedure: CARDIOVERSION;  Surgeon: Vesta Mixer, MD;  Location: [facility] ENDOSCOPY;  Service: Cardiovascular;  Laterality: N/A;   CHOLECYSTECTOMY     COLONOSCOPY     COLONOSCOPY WITH PROPOFOL N/A 09/10/2018   Procedure: COLONOSCOPY WITH PROPOFOL;  Surgeon: Bernette Redbird, MD;  Location: [facility] ENDOSCOPY;  Service: Endoscopy;  Laterality: N/A;   ESOPHAGOGASTRODUODENOSCOPY (EGD) WITH PROPOFOL N/A 09/10/2018   Procedure: ESOPHAGOGASTRODUODENOSCOPY (EGD) WITH PROPOFOL;  Surgeon: Bernette Redbird, MD;  Location: [facility] ENDOSCOPY;  Service: Endoscopy;  Laterality: N/A;   EYE SURGERY     GIVENS CAPSULE STUDY N/A 09/10/2018   Procedure: GIVENS CAPSULE STUDY;  Surgeon: Bernette Redbird, MD;  Location: [facility] ENDOSCOPY;  Service: Endoscopy;  Laterality: N/A;   NEPHRECTOMY Left 2002   RIGHT/LEFT HEART CATH AND CORONARY ANGIOGRAPHY N/A 01/27/2021   Procedure: RIGHT/LEFT HEART CATH AND CORONARY ANGIOGRAPHY;  Surgeon: Laurey Morale, MD;  Location: [facility] INVASIVE CV LAB;  Service: Cardiovascular;  Laterality: N/A;   TEE WITHOUT CARDIOVERSION N/A 01/27/2021   Procedure: TRANSESOPHAGEAL ECHOCARDIOGRAM (TEE);  Surgeon: Laurey Morale, MD;  Location: [facility] ENDOSCOPY;  Service: Cardiovascular;  Laterality: N/A;   TEE WITHOUT CARDIOVERSION N/A 02/26/2021   Procedure: TRANSESOPHAGEAL ECHOCARDIOGRAM (TEE);  Surgeon: Tonny Bollman, MD;  Location: [facility] INVASIVE CV LAB;  Service: Cardiovascular;  Laterality: N/A;   TEE WITHOUT CARDIOVERSION  N/A 02/27/2021   Procedure: TRANSESOPHAGEAL ECHOCARDIOGRAM (TEE);  Surgeon: Laurey Morale, MD;  Location: [facility] ENDOSCOPY;  Service: Cardiovascular;  Laterality: N/A;   TEE WITHOUT CARDIOVERSION N/A 03/19/2021   Procedure: TRANSESOPHAGEAL ECHOCARDIOGRAM (TEE);  Surgeon: Tonny Bollman, MD;  Location: [facility] INVASIVE CV LAB;  Service: Cardiovascular;  Laterality: N/A;   TONSILLECTOMY     TRANSCATHETER MITRAL EDGE TO EDGE REPAIR N/A 02/26/2021   Procedure: MITRAL VALVE REPAIR;  Surgeon: Tonny Bollman, MD;  Location: [facility] INVASIVE CV LAB;  Service: Cardiovascular;  Laterality: N/A;   TRANSCATHETER MITRAL EDGE TO EDGE REPAIR N/A 03/19/2021   Procedure: MITRAL VALVE REPAIR;  Surgeon: Tonny Bollman, MD;  Location: [facility] INVASIVE CV LAB;  Service: Cardiovascular;  Laterality: N/A;    FAMILY HISTORY: Family History  Problem Relation Age of Onset   Breast cancer Mother 73   Hypertension Father    Diabetes Neg Hx    Stroke Neg Hx    CAD Neg Hx    Colon cancer Neg Hx    Colon polyps Neg Hx    Esophageal cancer Neg Hx    Rectal cancer Neg Hx    Stomach cancer Neg Hx     SOCIAL HISTORY: Social History   Socioeconomic History   Marital status: Widowed    Spouse name: Not on file   Number of children: 3   Years of education: Not on file   Highest education level: Professional school degree (e.g., MD, DDS, DVM, JD)  Occupational History   Occupation: judge, lawyer, professor    Comment: retired from all 3  Tobacco Use   Smoking status: Former    Types: Pipe    Quit date: 05/17/1978    Years since quitting: 44.8   Smokeless tobacco: Never   Tobacco comments:    Smoked cigars for approx 40 years.   Vaping Use   Vaping status: Never Used  Substance and Sexual Activity   Alcohol use: Yes    Alcohol/week: 0.0 standard drinks of alcohol    Comment: occasional   Drug use: No   Sexual activity: Not on file  Other Topics Concern   Not on file  Social History Narrative   Not on file    Social Determinants of Health   Financial Resource Strain: Not on file  Food Insecurity: Not on file  Transportation Needs: Not on file  Physical Activity: Not on file  Stress: Not on file  Social Connections: Not on file  Intimate Partner Violence: Not on file      Levert Feinstein, M.D. Ph.D.  [facility] [address], [address] [place], [place] [address] Ph: [phone] Fax: [phone]  CC:  Laurey Morale, MD [address] [place],  [place] [address]  Geoffry Paradise, MD

## 2023-03-18 ENCOUNTER — Ambulatory Visit (HOSPITAL_COMMUNITY)
Admission: RE | Admit: 2023-03-18 | Discharge: 2023-03-18 | Disposition: A | Discharge status: Home / Self Care | Payer: Medicare PPO | Source: Ambulatory Visit | Attending: Cardiology | Admitting: Cardiology

## 2023-03-18 DIAGNOSIS — R220 Localized swelling, mass and lump, head: Secondary | ICD-10-CM | POA: Diagnosis not present

## 2023-03-18 DIAGNOSIS — H532 Diplopia: Secondary | ICD-10-CM | POA: Diagnosis not present

## 2023-03-18 DIAGNOSIS — I639 Cerebral infarction, unspecified: Secondary | ICD-10-CM | POA: Diagnosis not present

## 2023-03-18 DIAGNOSIS — I6782 Cerebral ischemia: Secondary | ICD-10-CM | POA: Diagnosis not present

## 2023-03-18 LAB — ANA W/REFLEX IF POSITIVE: Anti Nuclear Antibody (ANA): NEGATIVE

## 2023-03-18 LAB — C-REACTIVE PROTEIN: CRP: <1 mg/L (ref 0–10)

## 2023-03-18 LAB — ACETYLCHOLINE RECEPTOR, BINDING: AChR Binding Ab, Serum: <0.03 nmol/L (ref 0.00–0.24)

## 2023-03-18 LAB — SEDIMENTATION RATE: Sed Rate: 2 mm/h (ref 0–30)

## 2023-03-18 MED ORDER — GADOBUTROL 1 MMOL/ML IV SOLN
7.0000 mL | Freq: Once | INTRAVENOUS | Status: AC | PRN
  Administered 2023-03-18: 7 mL via INTRAVENOUS

## 2023-03-21 ENCOUNTER — Telehealth (HOSPITAL_COMMUNITY): Payer: Self-pay

## 2023-03-21 NOTE — Telephone Encounter (Addendum)
Pt aware, agreeable, and verbalized understanding   ----- Message from Marca Ancona sent at 03/19/2023  1:52 PM EDT ----- No significant carotid stenosis

## 2023-03-21 NOTE — Telephone Encounter (Addendum)
Pt aware, agreeable, and verbalized understanding   ----- Message from Marca Ancona sent at 03/19/2023  1:26 PM EDT ----- No evidence for CVA, small meningioma noted. Will forward to neurology.

## 2023-03-22 ENCOUNTER — Telehealth: Payer: Medicare PPO

## 2023-03-24 ENCOUNTER — Encounter: Payer: Self-pay | Admitting: Neurology

## 2023-03-30 ENCOUNTER — Other Ambulatory Visit (HOSPITAL_COMMUNITY): Payer: Self-pay | Admitting: Cardiology

## 2023-03-30 ENCOUNTER — Other Ambulatory Visit (HOSPITAL_COMMUNITY): Payer: Self-pay

## 2023-03-30 MED ORDER — APIXABAN 5 MG PO TABS
5.0000 mg | ORAL_TABLET | Freq: Two times a day (BID) | ORAL | 3 refills | Status: DC
  Filled 2023-03-30: qty 180, 90d supply, fill #0
  Filled 2023-06-28: qty 180, 90d supply, fill #1
  Filled 2023-09-23: qty 180, 90d supply, fill #2
  Filled 2024-01-09: qty 180, 90d supply, fill #3

## 2023-03-31 ENCOUNTER — Ambulatory Visit (HOSPITAL_COMMUNITY)
Admission: RE | Admit: 2023-03-31 | Discharge: 2023-03-31 | Disposition: A | Discharge status: Home / Self Care | Payer: Medicare PPO | Source: Ambulatory Visit | Attending: Cardiology | Admitting: Cardiology

## 2023-03-31 DIAGNOSIS — I5032 Chronic diastolic (congestive) heart failure: Secondary | ICD-10-CM | POA: Insufficient documentation

## 2023-03-31 LAB — BASIC METABOLIC PANEL
Anion gap: 8 (ref 5–15)
BUN: 22 mg/dL (ref 8–23)
CO2: 25 mmol/L (ref 22–32)
Calcium: 9.1 mg/dL (ref 8.9–10.3)
Chloride: 103 mmol/L (ref 98–111)
Creatinine, Ser: 1.06 mg/dL (ref 0.61–1.24)
GFR, Estimated: >60 mL/min (ref >60)
Glucose, Bld: 92 mg/dL (ref 70–99)
Potassium: 4.2 mmol/L (ref 3.5–5.1)
Sodium: 136 mmol/L (ref 135–145)

## 2023-04-01 ENCOUNTER — Other Ambulatory Visit (HOSPITAL_COMMUNITY): Payer: Medicare PPO

## 2023-04-05 DIAGNOSIS — Z961 Presence of intraocular lens: Secondary | ICD-10-CM | POA: Diagnosis not present

## 2023-04-05 DIAGNOSIS — H532 Diplopia: Secondary | ICD-10-CM | POA: Diagnosis not present

## 2023-04-06 ENCOUNTER — Telehealth: Payer: Medicare PPO | Admitting: Neurology

## 2023-04-06 DIAGNOSIS — H532 Diplopia: Secondary | ICD-10-CM | POA: Diagnosis not present

## 2023-04-06 NOTE — Progress Notes (Unsigned)
No chief complaint on file.     ASSESSMENT AND PLAN  John Holder is a 89 y.o. 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, due to acute small vessel disease event, now has improved, no longer have double vision  He does has multiple vascular risk factors, aging, atrial fibrillation, on Eliquis, history of mitral valve and tricuspid valve repair,  Acetylcholine receptor antibody, inflammatory markers were all negative  MRI of the brain showed no acute abnormality, incidental findings of small meningioma with no evidence of mass effect, and ultrasound of carotid artery was essentially normal  Keep Eliquis with history of atrial fibrillation  Encouraged him to continue moderate exercise increase water intake,  DIAGNOSTIC DATA (LABS, IMAGING, TESTING) - I reviewed patient records, labs, notes, testing and imaging myself where available.   MEDICAL HISTORY:  John Holder, is a 89 year old male seen in request by his primary care from [facility] Dr. Jacky Kindle, Gerlene Burdock, for evaluation of double vision, initial evaluation was on April 06, 2023  History is obtained from the patient and review of electronic medical records. I personally reviewed pertinent available imaging films in PACS.   PMHx of  Left 6th nerve palsy. A fib, eliquis HTN CHF MVR, in 2022, TVR in 2023, Dr. Excell Seltzer,   He is already on Eliquis because of history of atrial fibrillation, lives at home with his partner, still very physically active, walk regularly, play tennis twice a week, in August 2024, woke up in the morning, he noticed double vision, was seen by ophthalmologist, concerning ischemic cranial neuropathy, referred to neurology for further evaluation  Over the past few months, his double vision has improved, this week, sometimes he woke up with  no double vision, occasionally transient double vision,  He denies ptosis, no dysarthria, no dysphagia, no limb muscle weakness denies headaches, or body achy pain   Virtual Visit via video UPDATE Apr 06 2023 I discussed the limitations of evaluation and management by telemedicine and the availability of in person appointments. The patient expressed understanding and agreed to proceed  Location: Provider: [facility] office; Patient: Home  I connected with John Holder  on April 06, 2023 by a video enabled telemedicine application and verified that I am speaking with the correct person using two identifiers.  UPDATED HiSTORY His double vision has disappeared, he is overall feeling well, no body achy pain, no visual loss, laboratory in November showed normal ESR C-reactive proteins, Negative acetylcholine receptor antibody Personally reviewed MRI of the brain from March 18, 2023 1. No evidence of an acute or recent subacute infarction. 2. 5 mm dural-based mass overlying the anterior right frontal lobe most consistent with a meningioma. 3. 7 mm extra-axial enhancing focus along the petrous left temporal bone, as described. This is favored to reflect asymmetric venous enhancement. However, a small meningioma cannot be excluded. 4. Mild chronic small vessel ischemic changes within the cerebral white matter. 5. Mild-to-moderate generalized cerebral atrophy. 6. Mild left maxillary sinusitis.    Observations/Objective: I have reviewed problem lists, medications, allergies.   Awake, alert, oriented to history taking and casual conversation, facial symmetric, no dysarthria no aphasia, moving 4 extremities without difficulties. REVIEW OF SYSTEMS:  Full 14 system review of systems performed and notable only for as above All other review of systems were negative.   ALLERGIES: No Known Allergies  HOME MEDICATIONS: Current Outpatient Medications  Medication Sig Dispense Refill   ALPRAZolam  (XANAX) 0.25 MG tablet Take 1 tablet (0.25 mg total) by mouth every 6 (six) hours as needed FOR ANXIETY 30 tablet 0   apixaban (ELIQUIS) 5 MG TABS tablet Take 1 tablet (5 mg total) by mouth 2 (two) times daily. 180 tablet 3   Apoaequorin (PREVAGEN EXTRA STRENGTH PO) Take 1 tablet by mouth daily.     Barberry-Oreg Grape-Goldenseal (BERBERINE COMPLEX PO) Take 2,400 mg by mouth daily.     Cholecalciferol 50 MCG (2000 UT) CAPS take one capsule Orally daily     Chromium 1000 MCG TABS Take 1,000 mcg by mouth daily. chromium picolinate     Coenzyme Q10 (CO Q10) 200 MG CAPS Take 200 mg by mouth daily.     empagliflozin (JARDIANCE) 10 MG TABS tablet Take 1 tablet (10 mg total) by mouth daily before breakfast. 30 tablet 11   ferrous gluconate (FERGON) 324 MG tablet Take 324 mg by mouth 3 (three) times a week.     furosemide (LASIX) 20 MG tablet Take 1 tablet (20 mg total) by mouth daily. 90 tablet 3   losartan (COZAAR) 25 MG tablet Take 1 tablet (25 mg total) by mouth daily. 90 tablet 3   MAGNESIUM GLYCINATE PO Take 525 mg by mouth in the morning and at bedtime.     Melatonin 10 MG TABS Take 10 mg by mouth at bedtime.     metoprolol succinate (TOPROL-XL) 50 MG 24 hr tablet Take 1 tablet (50 mg total) by mouth 2 (two) times daily. Take with or immediately following a meal 180 tablet 1   Misc Natural Products (ADVANCED JOINT RELIEF PO) Take 1 tablet by mouth 2 (two) times daily.     Multiple Vitamin (MULTIVITAMIN) capsule Take 1 capsule by mouth daily. Mega men 50 plus     Multiple Vitamins-Minerals (ZINC PO) Take by mouth daily.     NON FORMULARY Take 300 mg by mouth 2 (two) times daily. TRU vitamin  Nigen     omeprazole (PRILOSEC) 20 MG capsule Take 1 capsule (20 mg total) by mouth daily. 90 capsule 3   OVER THE COUNTER MEDICATION Take 2 capsules by mouth daily. Prostacare     OVER THE COUNTER MEDICATION Take 1 capsule by mouth 2 (two) times daily. Uricare Himalaya     OVER THE COUNTER MEDICATION Take 1  tablet by mouth 2 (two) times daily. virectin     OVER THE COUNTER MEDICATION Take 1 tablet by mouth 2 (two) times daily. Liver care     OVER THE COUNTER MEDICATION Take 1 tablet by mouth 2 (two) times daily. Tri Flex     OVER THE COUNTER MEDICATION Take 1 capsule by mouth 2 (two) times daily. conjugated linoleic acid     OVER THE COUNTER MEDICATION Take 1 tablet by mouth daily in the afternoon. CDB Gummies     OVER THE COUNTER MEDICATION Take 1 capsule by mouth daily in the afternoon. Hemp oil gummies     OVER THE COUNTER MEDICATION Take 1 tablet by mouth daily. arthrozene     Polyethyl Glycol-Propyl Glycol (SYSTANE) 0.4-0.3 % SOLN 1 drop daily as needed (as needed for dry eye).     Sodium Hyaluronate, oral, (HYALURONIC ACID) 100 MG CAPS Take 100 mg by mouth 2 (two) times daily.     spironolactone (ALDACTONE) 25 MG tablet Take 1 tablet (25 mg total) by mouth daily. 90 tablet 3   tadalafil (  CIALIS) 20 MG tablet TAKE ONE TABLET BY MOUTH DAILY AS NEEDED 30 tablet 11   No current facility-administered medications for this visit.    PAST MEDICAL HISTORY: Past Medical History:  Diagnosis Date   Blood transfusion without reported diagnosis    Cataract    removed in replace bilateral   CHF (congestive heart failure) (HCC)    Dysrhythmia    GERD (gastroesophageal reflux disease)    Hypertension    Iron deficiency anemia    amdx 08/2018 >> Tx with PRBCs and Feraheme started; no GI source per workup; followed by Heme (Dr. Darnelle Catalan) and GI (Dr. Matthias Hughs)    Left renal mass    s/p L nephrectomy   Mitral regurgitation    Echo 02/2016:  Mild conc LVH, EF 55-60, mild to mod MR, mod LAE, mod to severe RAE, mod TR, PASP 41 // Echo 10/2018: EF 60-65, mod conc LVH, severe BAE, trivial eff, mild MVP with mod MR, mod to severe TR, asc aorta 36 (mild dilation)    Persistent Atrial Fibrillation    Pulmonary hypertension (HCC)    Echocardiogram 7/22: EF 60-65, no RWMA, normal RVSF, RVSP 70.7 (severe pulmonary  hypertension), severe BAE, mod MR, severe TR, mild to mod AV sclerosis w/o AS   S/P mitral valve clip implantation 02/26/2021   G4 NT W device; A3/P3 with Dr. Excell Seltzer    PAST SURGICAL HISTORY: Past Surgical History:  Procedure Laterality Date   CARDIAC CATHETERIZATION     CARDIOVERSION N/A 02/04/2016   Procedure: CARDIOVERSION;  Surgeon: Vesta Mixer, MD;  Location: [facility] ENDOSCOPY;  Service: Cardiovascular;  Laterality: N/A;   CHOLECYSTECTOMY     COLONOSCOPY     COLONOSCOPY WITH PROPOFOL N/A 09/10/2018   Procedure: COLONOSCOPY WITH PROPOFOL;  Surgeon: Bernette Redbird, MD;  Location: [facility] ENDOSCOPY;  Service: Endoscopy;  Laterality: N/A;   ESOPHAGOGASTRODUODENOSCOPY (EGD) WITH PROPOFOL N/A 09/10/2018   Procedure: ESOPHAGOGASTRODUODENOSCOPY (EGD) WITH PROPOFOL;  Surgeon: Bernette Redbird, MD;  Location: [facility] ENDOSCOPY;  Service: Endoscopy;  Laterality: N/A;   EYE SURGERY     GIVENS CAPSULE STUDY N/A 09/10/2018   Procedure: GIVENS CAPSULE STUDY;  Surgeon: Bernette Redbird, MD;  Location: [facility] ENDOSCOPY;  Service: Endoscopy;  Laterality: N/A;   NEPHRECTOMY Left 2002   RIGHT/LEFT HEART CATH AND CORONARY ANGIOGRAPHY N/A 01/27/2021   Procedure: RIGHT/LEFT HEART CATH AND CORONARY ANGIOGRAPHY;  Surgeon: Laurey Morale, MD;  Location: [facility] INVASIVE CV LAB;  Service: Cardiovascular;  Laterality: N/A;   TEE WITHOUT CARDIOVERSION N/A 01/27/2021   Procedure: TRANSESOPHAGEAL ECHOCARDIOGRAM (TEE);  Surgeon: Laurey Morale, MD;  Location: [facility] ENDOSCOPY;  Service: Cardiovascular;  Laterality: N/A;   TEE WITHOUT CARDIOVERSION N/A 02/26/2021   Procedure: TRANSESOPHAGEAL ECHOCARDIOGRAM (TEE);  Surgeon: Tonny Bollman, MD;  Location: [facility] INVASIVE CV LAB;  Service: Cardiovascular;  Laterality: N/A;   TEE WITHOUT CARDIOVERSION N/A 02/27/2021   Procedure: TRANSESOPHAGEAL ECHOCARDIOGRAM (TEE);  Surgeon: Laurey Morale, MD;  Location: [facility] ENDOSCOPY;  Service: Cardiovascular;  Laterality: N/A;   TEE WITHOUT CARDIOVERSION  N/A 03/19/2021   Procedure: TRANSESOPHAGEAL ECHOCARDIOGRAM (TEE);  Surgeon: Tonny Bollman, MD;  Location: [facility] INVASIVE CV LAB;  Service: Cardiovascular;  Laterality: N/A;   TONSILLECTOMY     TRANSCATHETER MITRAL EDGE TO EDGE REPAIR N/A 02/26/2021   Procedure: MITRAL VALVE REPAIR;  Surgeon: Tonny Bollman, MD;  Location: [facility] INVASIVE CV LAB;  Service: Cardiovascular;  Laterality: N/A;   TRANSCATHETER MITRAL EDGE TO EDGE REPAIR N/A 03/19/2021   Procedure: MITRAL VALVE REPAIR;  Surgeon: Tonny Bollman,  MD;  Location: [facility] INVASIVE CV LAB;  Service: Cardiovascular;  Laterality: N/A;    FAMILY HISTORY: Family History  Problem Relation Age of Onset   Breast cancer Mother 12   Hypertension Father    Diabetes Neg Hx    Stroke Neg Hx    CAD Neg Hx    Colon cancer Neg Hx    Colon polyps Neg Hx    Esophageal cancer Neg Hx    Rectal cancer Neg Hx    Stomach cancer Neg Hx     SOCIAL HISTORY: Social History   Socioeconomic History   Marital status: Widowed    Spouse name: Not on file   Number of children: 3   Years of education: Not on file   Highest education level: Professional school degree (e.g., MD, DDS, DVM, JD)  Occupational History   Occupation: judge, lawyer, professor    Comment: retired from all 3  Tobacco Use   Smoking status: Former    Types: Pipe    Quit date: 05/17/1978    Years since quitting: 44.9   Smokeless tobacco: Never   Tobacco comments:    Smoked cigars for approx 40 years.   Vaping Use   Vaping status: Never Used  Substance and Sexual Activity   Alcohol use: Yes    Alcohol/week: 0.0 standard drinks of alcohol    Comment: occasional   Drug use: No   Sexual activity: Not on file  Other Topics Concern   Not on file  Social History Narrative   Not on file   Social Determinants of Health   Financial Resource Strain: Not on file  Food Insecurity: Not on file  Transportation Needs: Not on file  Physical Activity: Not on file  Stress: Not on file   Social Connections: Not on file  Intimate Partner Violence: Not on file      Levert Feinstein, M.D. Ph.D.  [facility] [address] [place], [place] [address] Ph: [phone] Fax: [phone]  CC:  Geoffry Paradise, MD [address] [place],  [place] [address]  Geoffry Paradise, MD

## 2023-04-07 ENCOUNTER — Encounter: Payer: Self-pay | Admitting: Oncology

## 2023-04-07 ENCOUNTER — Other Ambulatory Visit (HOSPITAL_COMMUNITY): Payer: Self-pay

## 2023-04-07 MED ORDER — ALPRAZOLAM 0.25 MG PO TABS
0.2500 mg | ORAL_TABLET | Freq: Four times a day (QID) | ORAL | 0 refills | Status: DC | PRN
  Filled 2023-04-07: qty 30, 30d supply, fill #0

## 2023-04-13 ENCOUNTER — Other Ambulatory Visit (HOSPITAL_COMMUNITY): Payer: Self-pay

## 2023-04-28 ENCOUNTER — Emergency Department (HOSPITAL_BASED_OUTPATIENT_CLINIC_OR_DEPARTMENT_OTHER): Payer: Medicare PPO

## 2023-04-28 ENCOUNTER — Emergency Department (HOSPITAL_BASED_OUTPATIENT_CLINIC_OR_DEPARTMENT_OTHER)
Admission: EM | Admit: 2023-04-28 | Discharge: 2023-04-28 | Disposition: A | Discharge status: Home / Self Care | Payer: Medicare PPO | Attending: Emergency Medicine | Admitting: Emergency Medicine

## 2023-04-28 DIAGNOSIS — S0990XA Unspecified injury of head, initial encounter: Secondary | ICD-10-CM | POA: Diagnosis not present

## 2023-04-28 DIAGNOSIS — Y9373 Activity, racquet and hand sports: Secondary | ICD-10-CM | POA: Insufficient documentation

## 2023-04-28 DIAGNOSIS — I110 Hypertensive heart disease with heart failure: Secondary | ICD-10-CM | POA: Diagnosis not present

## 2023-04-28 DIAGNOSIS — M47812 Spondylosis without myelopathy or radiculopathy, cervical region: Secondary | ICD-10-CM | POA: Diagnosis not present

## 2023-04-28 DIAGNOSIS — W19XXXA Unspecified fall, initial encounter: Secondary | ICD-10-CM | POA: Insufficient documentation

## 2023-04-28 DIAGNOSIS — M4312 Spondylolisthesis, cervical region: Secondary | ICD-10-CM | POA: Diagnosis not present

## 2023-04-28 DIAGNOSIS — Z79899 Other long term (current) drug therapy: Secondary | ICD-10-CM | POA: Diagnosis not present

## 2023-04-28 DIAGNOSIS — Z7901 Long term (current) use of anticoagulants: Secondary | ICD-10-CM | POA: Diagnosis not present

## 2023-04-28 DIAGNOSIS — I509 Heart failure, unspecified: Secondary | ICD-10-CM | POA: Diagnosis not present

## 2023-04-28 DIAGNOSIS — S199XXA Unspecified injury of neck, initial encounter: Secondary | ICD-10-CM | POA: Diagnosis not present

## 2023-04-28 DIAGNOSIS — M4802 Spinal stenosis, cervical region: Secondary | ICD-10-CM | POA: Diagnosis not present

## 2023-04-28 NOTE — ED Provider Notes (Signed)
[place] EMERGENCY DEPARTMENT AT [facility] Provider Note   CSN: [id number] Arrival date & time: 04/28/23  7829     History  No chief complaint on file.   John Holder is a 89 y.o. male.  89 yo M with hx of AF on eliquis, CHF, HTN, and mitraclip who presents after head injury.  Patient was playing tennis and backed up and fell.  Says that his bottom hit the ground first but then he hit his head.  Did not lose consciousness.  Has only mild headache since then.  No neck pain.  No pain in his arms or legs.  Has been ambulatory since.  Last took his Eliquis at 43 AM.        Home Medications Prior to Admission medications   Medication Sig Start Date End Date Taking? Authorizing Provider  ALPRAZolam (XANAX) 0.25 MG tablet Take 1 tablet (0.25 mg total) by mouth every 6 (six) hours as needed FOR ANXIETY 09/02/22     ALPRAZolam (XANAX) 0.25 MG tablet Take 1 tablet (0.25 mg total) by mouth every 6 (six) hours as needed for anxiety 04/07/23     apixaban (ELIQUIS) 5 MG TABS tablet Take 1 tablet (5 mg total) by mouth 2 (two) times daily. 03/30/23   Laurey Morale, MD  Apoaequorin [facility] EXTRA STRENGTH PO) Take 1 tablet by mouth daily. 10/16/18   [provider]  Barberry-Oreg Grape-Goldenseal (BERBERINE COMPLEX PO) Take 2,400 mg by mouth daily.    [provider]  Cholecalciferol 50 MCG (2000 UT) CAPS take one capsule Orally daily    [provider]  Chromium 1000 MCG TABS Take 1,000 mcg by mouth daily. chromium picolinate    [provider]  Coenzyme Q10 (CO Q10) 200 MG CAPS Take 200 mg by mouth daily. 03/06/04   [provider]  empagliflozin (JARDIANCE) 10 MG TABS tablet Take 1 tablet (10 mg total) by mouth daily before breakfast. 03/09/23   Laurey Morale, MD  ferrous gluconate (FERGON) 324 MG tablet Take 324 mg by mouth 3 (three) times a week.    [provider]  furosemide (LASIX) 20 MG tablet Take 1 tablet (20 mg  total) by mouth daily. 03/09/23   Laurey Morale, MD  losartan (COZAAR) 25 MG tablet Take 1 tablet (25 mg total) by mouth daily. 11/08/22   Laurey Morale, MD  MAGNESIUM GLYCINATE PO Take 525 mg by mouth in the morning and at bedtime. 06/16/18   [provider]  Melatonin 10 MG TABS Take 10 mg by mouth at bedtime. 03/06/04   [provider]  metoprolol succinate (TOPROL-XL) 50 MG 24 hr tablet Take 1 tablet (50 mg total) by mouth 2 (two) times daily. Take with or immediately following a meal 11/08/22   Laurey Morale, MD  Misc Natural Products (ADVANCED JOINT RELIEF PO) Take 1 tablet by mouth 2 (two) times daily.    [provider]  Multiple Vitamin (MULTIVITAMIN) capsule Take 1 capsule by mouth daily. Mega men 50 plus    [provider]  Multiple Vitamins-Minerals (ZINC PO) Take by mouth daily.    [provider]  NON FORMULARY Take 300 mg by mouth 2 (two) times daily. TRU vitamin  Nigen    [provider]  omeprazole (PRILOSEC) 20 MG capsule Take 1 capsule (20 mg total) by mouth daily. 05/25/22     OVER THE COUNTER MEDICATION Take 2 capsules by mouth daily. Prostacare    [provider]  OVER THE COUNTER MEDICATION Take 1 capsule by mouth 2 (two) times daily. U[employer]are [employer], Historical, MD  OVER THE COUNTER MEDICATION Take 1 tablet by mouth 2 (two) times daily. virectin    [provider]  OVER THE COUNTER MEDICATION Take 1 tablet by mouth 2 (two) times daily. Liver care    [provider]  OVER THE COUNTER MEDICATION Take 1 tablet by mouth 2 (two) times daily. Tri Flex    [provider]  OVER THE COUNTER MEDICATION Take 1 capsule by mouth 2 (two) times daily. conjugated linoleic acid    [provider]  OVER THE COUNTER MEDICATION Take 1 tablet by mouth daily in the afternoon. CDB Gummies    [provider]  OVER THE COUNTER MEDICATION Take 1 capsule by mouth daily in the  afternoon. Hemp oil gummies    [provider]  OVER THE COUNTER MEDICATION Take 1 tablet by mouth daily. arthrozene    [provider]  Polyethyl Glycol-Propyl Glycol (SYSTANE) 0.4-0.3 % SOLN 1 drop daily as needed (as needed for dry eye).    [provider]  Sodium Hyaluronate, oral, (HYALURONIC ACID) 100 MG CAPS Take 100 mg by mouth 2 (two) times daily.    [provider]  spironolactone (ALDACTONE) 25 MG tablet Take 1 tablet (25 mg total) by mouth daily. 03/09/23   Laurey Morale, MD  tadalafil (CIALIS) 20 MG tablet TAKE ONE TABLET BY MOUTH DAILY AS NEEDED 03/10/20   Bjorn Pippin, MD      Allergies    Patient has no known allergies.    Review of Systems   Review of Systems  Physical Exam Updated Vital Signs BP 106/69   Pulse (!) 110   Temp 97.8 F (36.6 C) (Oral)   Resp 18   SpO2 100%  Physical Exam Vitals and nursing note reviewed.  Constitutional:      General: He is not in acute distress.    Appearance: Normal appearance. He is well-developed. He is not ill-appearing.  HENT:     Head: Normocephalic and atraumatic.     Right Ear: External ear normal.     Left Ear: External ear normal.     Nose: Nose normal.     Mouth/Throat:     Mouth: Mucous membranes are moist.     Pharynx: Oropharynx is clear.  Eyes:     Extraocular Movements: Extraocular movements intact.     Conjunctiva/sclera: Conjunctivae normal.     Pupils: Pupils are equal, round, and reactive to light.  Neck:     Comments: No C-spine midline tenderness to palpation Cardiovascular:     Rate and Rhythm: Normal rate and regular rhythm.     Pulses: Normal pulses.     Heart sounds: Normal heart sounds.  Pulmonary:     Effort: Pulmonary effort is normal. No respiratory distress.     Breath sounds: Normal breath sounds.  Abdominal:     General: Abdomen is flat. There is no distension.     Palpations: Abdomen is soft. There is no mass.     Tenderness: There is no  abdominal tenderness. There is no guarding.  Musculoskeletal:        General: No deformity. Normal range of motion.     Cervical back: Normal range of motion and neck supple. No rigidity or tenderness.     Right lower leg: No edema.     Left lower leg: No edema.  Comments: No tenderness to palpation of midline thoracic or lumbar spine.  No step-offs palpated.  No tenderness to palpation of chest wall.  No bruising noted.  No tenderness to palpation of bilateral clavicles.  No tenderness to palpation, bruising, or deformities noted of bilateral shoulders, elbows, wrists, hips, knees, or ankles.  Skin:    General: Skin is warm and dry.  Neurological:     General: No focal deficit present.     Mental Status: He is alert and oriented to person, place, and time. Mental status is at baseline.     Cranial Nerves: No cranial nerve deficit.     Sensory: No sensory deficit.     Motor: No weakness.  Psychiatric:        Mood and Affect: Mood normal.        Behavior: Behavior normal.     ED Results / Procedures / Treatments   Labs (all labs ordered are listed, but only abnormal results are displayed) Labs Reviewed - No data to display  EKG None  Radiology CT Cervical Spine Wo Contrast Result Date: 04/28/2023 CLINICAL DATA:  Neck trauma.  [name] backwards while playing tennis. EXAM: CT CERVICAL SPINE WITHOUT CONTRAST TECHNIQUE: Multidetector CT imaging of the cervical spine was performed without intravenous contrast. Multiplanar CT image reconstructions were also generated. RADIATION DOSE REDUCTION: This exam was performed according to the departmental dose-optimization program which includes automated exposure control, adjustment of the mA and/or kV according to patient size and/or use of iterative reconstruction technique. COMPARISON:  None Available. FINDINGS: Alignment: Slight degenerative anterolisthesis is present at C2-3. No other significant listhesis is present. The cervical lordosis  is preserved. Skull base and vertebrae: The craniocervical junction is normal. Mild osteopenia is noted. No acute fractures are present. Vertebral body heights are normal. Soft tissues and spinal canal: No prevertebral fluid or swelling. No visible canal hematoma. Disc levels: Multilevel degenerative changes are secondary to uncovertebral and facet disease. Moderate left foraminal stenosis is present at C3-4 and C4-5. Mild foraminal narrowing is present bilaterally at C5-6 and C6-7. No significant stenosis is present at C7-T1. Upper chest: The lung apices are clear. The thoracic inlet is within normal limits. IMPRESSION: 1. No acute fracture or traumatic subluxation. 2. Multilevel degenerative changes of the cervical spine as described. Electronically Signed   By: Marin Roberts M.D.   On: 04/28/2023 20:22   CT Head Wo Contrast Result Date: 04/28/2023 CLINICAL DATA:  Head trauma. [name] backwards while playing tennis. Patient is on Eliquis. EXAM: CT HEAD WITHOUT CONTRAST TECHNIQUE: Contiguous axial images were obtained from the base of the skull through the vertex without intravenous contrast. RADIATION DOSE REDUCTION: This exam was performed according to the departmental dose-optimization program which includes automated exposure control, adjustment of the mA and/or kV according to patient size and/or use of iterative reconstruction technique. COMPARISON:  MR head without and with contrast 03/18/2023. FINDINGS: Brain: Moderate atrophy and white matter changes are stable. The ventricles are proportionate to the degree of atrophy. Mild prominence of the extra-axial spaces is stable. Midline structures are within normal limits. The brainstem and cerebellum are within normal limits. Vascular: No hyperdense vessel or unexpected calcification. Skull: Calvarium is intact. No focal lytic or blastic lesions are present. No significant extracranial soft tissue lesion is present. Sinuses/Orbits: The paranasal sinuses  and mastoid air cells are clear. The globes and orbits are within normal limits. IMPRESSION: 1. No acute intracranial abnormality or significant interval change. 2. Stable atrophy and white matter disease.  This likely reflects the sequela of chronic microvascular ischemia. Electronically Signed   By: Marin Roberts M.D.   On: 04/28/2023 20:14    Procedures Procedures    Medications Ordered in ED Medications - No data to display  ED Course/ Medical Decision Making/ A&P                                 Medical Decision Making Amount and/or Complexity of Data Reviewed Radiology: ordered.   Gaither Nuding Appelhans is a 89 y.o. male with comorbidities that complicate the patient evaluation including AF on eliquis, CHF, HTN, and mitraclip who presents after head injury.   Initial Ddx:  TBI, concussion, C-spine injury, extremity fracture  MDM/Course:  Patient presents to the emergency department after head injury.  He is on Eliquis.  This is a result of a mechanical fall.  No other injuries on exam.  Had a CT of the head and C-spine without fracture.  Will him follow-up with his primary doctor in several days.   This patient presents to the ED for concern of complaints listed in HPI, this involves an extensive number of treatment options, and is a complaint that carries with it a high risk of complications and morbidity. Disposition including potential need for admission considered.   Dispo: DC Home. Return precautions discussed including, but not limited to, those listed in the AVS. Allowed pt time to ask questions which were answered fully prior to dc.  Records reviewed Outpatient Clinic Notes I independently reviewed the following imaging with scope of interpretation limited to determining acute life threatening conditions related to emergency care: CT Head and agree with the radiologist interpretation with the following exceptions: none I have reviewed the patients home medications and made  adjustments as needed Social Determinants of health:  Elderly  Portions of this note were generated with Scientist, clinical (histocompatibility and immunogenetics). Dictation errors may occur despite best attempts at proofreading.     Final Clinical Impression(s) / ED Diagnoses Final diagnoses:  Fall, initial encounter  Minor head injury, initial encounter  Current use of long term anticoagulation    Rx / DC Orders ED Discharge Orders     None         Rondel Baton, MD 04/29/23 1248

## 2023-04-28 NOTE — ED Triage Notes (Signed)
[name] backwards and struck head while playing tennis. No LOC. Skin intact. On Eliquis for Afib. A+Ox4. NAD ambulatory.

## 2023-04-28 NOTE — Discharge Instructions (Signed)
You were seen for your head injury in the emergency department.   At home, please take tylenol for your headache.    Check your MyChart online for the results of any tests that had not resulted by the time you left the emergency department.   Follow-up with your primary doctor in 2-3 days regarding your visit.    Return immediately to the emergency department if you experience any of the following: severe headache, or any other concerning symptoms.    Thank you for visiting our Emergency Department. It was a pleasure taking care of you today.

## 2023-05-04 ENCOUNTER — Other Ambulatory Visit (HOSPITAL_COMMUNITY): Payer: Self-pay

## 2023-05-04 MED ORDER — OMEPRAZOLE 20 MG PO CPDR
20.0000 mg | DELAYED_RELEASE_CAPSULE | Freq: Every day | ORAL | 3 refills | Status: DC
  Filled 2023-05-04: qty 90, 90d supply, fill #0
  Filled 2023-08-04: qty 90, 90d supply, fill #1
  Filled 2023-11-07: qty 90, 90d supply, fill #2
  Filled 2024-02-07: qty 90, 90d supply, fill #3

## 2023-05-09 ENCOUNTER — Other Ambulatory Visit (HOSPITAL_COMMUNITY): Payer: Self-pay | Admitting: Cardiology

## 2023-05-09 ENCOUNTER — Other Ambulatory Visit (HOSPITAL_COMMUNITY): Payer: Self-pay

## 2023-05-09 MED ORDER — METOPROLOL SUCCINATE ER 50 MG PO TB24
50.0000 mg | ORAL_TABLET | Freq: Two times a day (BID) | ORAL | 0 refills | Status: DC
  Filled 2023-05-09: qty 180, 90d supply, fill #0

## 2023-06-16 ENCOUNTER — Encounter (HOSPITAL_COMMUNITY): Payer: Self-pay | Admitting: Cardiology

## 2023-06-16 ENCOUNTER — Ambulatory Visit (HOSPITAL_COMMUNITY)
Admission: RE | Admit: 2023-06-16 | Discharge: 2023-06-16 | Disposition: A | Discharge status: Home / Self Care | Payer: Medicare PPO | Source: Ambulatory Visit | Attending: Cardiology | Admitting: Cardiology

## 2023-06-16 VITALS — BP 100/60 | HR 65 | Wt 161.8 lb

## 2023-06-16 DIAGNOSIS — Z8249 Family history of ischemic heart disease and other diseases of the circulatory system: Secondary | ICD-10-CM | POA: Insufficient documentation

## 2023-06-16 DIAGNOSIS — Z79899 Other long term (current) drug therapy: Secondary | ICD-10-CM | POA: Insufficient documentation

## 2023-06-16 DIAGNOSIS — Z7901 Long term (current) use of anticoagulants: Secondary | ICD-10-CM | POA: Insufficient documentation

## 2023-06-16 DIAGNOSIS — L820 Inflamed seborrheic keratosis: Secondary | ICD-10-CM | POA: Diagnosis not present

## 2023-06-16 DIAGNOSIS — K7460 Unspecified cirrhosis of liver: Secondary | ICD-10-CM | POA: Diagnosis not present

## 2023-06-16 DIAGNOSIS — I5032 Chronic diastolic (congestive) heart failure: Secondary | ICD-10-CM | POA: Insufficient documentation

## 2023-06-16 DIAGNOSIS — D485 Neoplasm of uncertain behavior of skin: Secondary | ICD-10-CM | POA: Diagnosis not present

## 2023-06-16 DIAGNOSIS — I4510 Unspecified right bundle-branch block: Secondary | ICD-10-CM | POA: Diagnosis not present

## 2023-06-16 DIAGNOSIS — I482 Chronic atrial fibrillation, unspecified: Secondary | ICD-10-CM | POA: Insufficient documentation

## 2023-06-16 DIAGNOSIS — L570 Actinic keratosis: Secondary | ICD-10-CM | POA: Diagnosis not present

## 2023-06-16 DIAGNOSIS — Z85828 Personal history of other malignant neoplasm of skin: Secondary | ICD-10-CM | POA: Diagnosis not present

## 2023-06-16 DIAGNOSIS — I272 Pulmonary hypertension, unspecified: Secondary | ICD-10-CM | POA: Insufficient documentation

## 2023-06-16 DIAGNOSIS — L817 Pigmented purpuric dermatosis: Secondary | ICD-10-CM | POA: Diagnosis not present

## 2023-06-16 DIAGNOSIS — I2510 Atherosclerotic heart disease of native coronary artery without angina pectoris: Secondary | ICD-10-CM | POA: Insufficient documentation

## 2023-06-16 DIAGNOSIS — I081 Rheumatic disorders of both mitral and tricuspid valves: Secondary | ICD-10-CM | POA: Diagnosis not present

## 2023-06-16 DIAGNOSIS — L814 Other melanin hyperpigmentation: Secondary | ICD-10-CM | POA: Diagnosis not present

## 2023-06-16 DIAGNOSIS — L821 Other seborrheic keratosis: Secondary | ICD-10-CM | POA: Diagnosis not present

## 2023-06-16 DIAGNOSIS — D1801 Hemangioma of skin and subcutaneous tissue: Secondary | ICD-10-CM | POA: Diagnosis not present

## 2023-06-16 LAB — CBC
HCT: 44 % (ref 39.0–52.0)
Hemoglobin: 14.8 g/dL (ref 13.0–17.0)
MCH: 31.5 pg (ref 26.0–34.0)
MCHC: 33.6 g/dL (ref 30.0–36.0)
MCV: 93.6 fL (ref 80.0–100.0)
Platelets: 124 10*3/uL — ABNORMAL LOW (ref 150–400)
RBC: 4.7 MIL/uL (ref 4.22–5.81)
RDW: 13 % (ref 11.5–15.5)
WBC: 3.1 10*3/uL — ABNORMAL LOW (ref 4.0–10.5)
nRBC: 0 % (ref 0.0–0.2)

## 2023-06-16 LAB — BASIC METABOLIC PANEL
Anion gap: 8 (ref 5–15)
BUN: 24 mg/dL — ABNORMAL HIGH (ref 8–23)
CO2: 25 mmol/L (ref 22–32)
Calcium: 9.2 mg/dL (ref 8.9–10.3)
Chloride: 105 mmol/L (ref 98–111)
Creatinine, Ser: 1.3 mg/dL — ABNORMAL HIGH (ref 0.61–1.24)
GFR, Estimated: 53 mL/min — ABNORMAL LOW (ref >60)
Glucose, Bld: 86 mg/dL (ref 70–99)
Potassium: 3.8 mmol/L (ref 3.5–5.1)
Sodium: 138 mmol/L (ref 135–145)

## 2023-06-16 LAB — LIPID PANEL
Cholesterol: 174 mg/dL (ref 0–200)
HDL: 54 mg/dL (ref >40)
LDL Cholesterol: 109 mg/dL — ABNORMAL HIGH (ref 0–99)
Total CHOL/HDL Ratio: 3.2 {ratio}
Triglycerides: 57 mg/dL (ref <150)
VLDL: 11 mg/dL (ref 0–40)

## 2023-06-16 LAB — BRAIN NATRIURETIC PEPTIDE: B Natriuretic Peptide: 308.4 pg/mL — ABNORMAL HIGH (ref 0.0–100.0)

## 2023-06-16 NOTE — Patient Instructions (Signed)
Thre has been no changes to your medications.  Labs done today, your results will be available in MyChart, we will contact you for abnormal readings.  Your physician recommends that you schedule a follow-up appointment in: 4 months ( May) ** PLEASE CALL THE OFFICE IN MARCH TO ARRANGE YOUR FOLLOW UP APPOINTMENT.**  If you have any questions or concerns before your next appointment please send [place] a message through [employer] or call our office at [phone].    TO LEAVE A MESSAGE FOR THE NURSE SELECT OPTION 2, PLEASE LEAVE A MESSAGE INCLUDING: YOUR NAME DATE OF BIRTH CALL BACK NUMBER REASON FOR CALL**this is important as we prioritize the call backs  YOU WILL RECEIVE A CALL BACK THE SAME DAY AS LONG AS YOU CALL BEFORE 4:00 PM  At the [facility], you and your health needs are our priority. As part of our continuing mission to provide you with exceptional heart care, we have created designated Provider Care Teams. These Care Teams include your primary Cardiologist (physician) and Advanced Practice Providers (APPs- Physician Assistants and Nurse Practitioners) who all work together to provide you with the care you need, when you need it.   You may see any of the following providers on your designated Care Team at your next follow up: Dr Arvilla Meres Dr Marca Ancona Dr. Dorthula Nettles Dr. Clearnce Hasten Amy Filbert Schilder, NP Robbie Lis, [place] [facility] [place], [place] Brynda Peon, NP [place] Lee, NP Karle Plumber, PharmD   Please be sure to bring in all your medications bottles to every appointment.    Thank you for choosing [facility]

## 2023-06-16 NOTE — Progress Notes (Signed)
PCP: Geoffry Paradise, MD Cardiology: Dr. Elease Hashimoto HF Cardiology: Dr. Shirlee Latch  Chief complaint: CHF  89 y.o. with history of chronic atrial fibrillation, diastolic CHF, pulmonary hypertension, [name] was referred by Dr. Elease Hashimoto for evaluation of CHF and pulmonary hypertension. Patient was found to be in atrial fibrillation back in 2017.  At that time, he had a cardioversion.  This did not hold, and he has been in atrial fibrillation since 2017 chronically as far as I can tell. He has had Fe deficiency anemia requiring Fe infusions, GI workup was unrevealing.  Also of note, he has cirrhosis by 9/21 MRI abdomen, cause uncertain (not a heavy drinker).   In the summer of 2022, his legs began to swell significantly and developed weeping sores.  He developed significant dyspnea.  He was seen in the cardiology clinic and started on Lasix.  He cut back on sodium.  Echo in 7/22 showed EF 60-65%, normal RV, PASP 71 mmHg, severe biatrial enlargement, severe TR, probably moderate MR, IVC dilated. Over time, his symptoms haves improved.   TEE in 9/22 showed severe TR (functional) and severe MR (prolapse/partial P3 flail).  LHC/RHC in 9/22 showed minimal CAD, mildly elevated PCWP.     Patient had Mitraclip placed in 10/22 but the device failed the day after with device only attached to one leaflet.  Patient had redo Mitraclip in 11/22, this time it was successful with new clip adjacent to original clip.  Echo in 12/22 showed EF 60-65%, D-shaped septum with moderate RV enlargement and mildly decreased systolic function, PASP 55 mmHg, severe biatrial enlargement, 2 Mitraclips with only 1 attached to both leaflets, mild MR with mean gradient 3 mmHg, severe TR, small iatrogenic secundum ASD, dilated IVC.   Echo in 3/23 showed EF 60-65%, D-shaped septum, mild RV dysfunction with mildly enlarged RV, PASP 50 mmHg, severe biatrial enlargement, mild MR s/p Mitraclip with mean gradient 4.5 mmHg, severe TR.    Patient had tTEER as part of Triluminate study on 02/10/22.  2 clips were placed . Post-procedure echo in 9/23 showed EF 66%, severe RV dilation with low normal systolic function, 2 Mitraclips with mild-moderate MR, 2 Triclips with moderate TR, PASP 63 mmHg.   Echo at [facility] in 10/23 showed LVEF 62%, mild LVH, mild RV dilation with normal systolic function, mild MR with 2 mitraclips and mean gradient 4, moderate TR with 2 triclips, PASP 42 mmHg.  Cardiac MRI in 11/23 showed LV EF 73%, RV EF 53%, mild-moderate TR with regurgitant fraction 20%.   Echo in 10/24 showed LV EF 75%, moderate-severe TR with 2 Triclips, moderate MR with 2 Mitraclips mean gradient 3 mmHg, severe biatrial enlargement, mild RV dilation with mildly decreased RV systolic function.    Onset of diplopia in 10/24.  He had had a prior episode of 6th nerve palsy.  He saw a neurologist, MRI of brain in 11/24 showed no CVA.  Carotid dopplers in 10/24 showed no significant disease. The diplopia has resolved, no definite cause.   Patient returns for followup of CHF and mitral regurgitation/tricuspid regurgitation.  He continues to do well. He has been playing tennis a couple of times/week.  No chest pain.  No lightheadedness. He is in chronic atrial fibrillation but does not feel it. Weight down 3 lbs.   ECG (personally reviewed): atrial fibrillation, RBBB  Labs (7/22): pro-BNP 1404 Labs (8/22): hgb 12.8, K 4.2, creatinine 1.11 Labs (9/22): K 4.5, creatinine 1.06 Labs (11/22): K 4.9, creatinine 1.04, hgb 12.1  Labs (12/22): K 4.2, creatinine 1.14, BNP 318 Labs (3/23): K 4.1, creatinine 1.12, BNP 292 Labs (9/23): K 4.3, creatinine 1.13 Labs (10/23): K 4.9, creatinine 1.22 Labs (10/24): K 4.3, creatinine 1.02, NT-proBNP 1457, hgb 15.7 Labs (11/24): K 4.2, creatinine 1.06  PMH: 1. Pulmonary hypertension: Echo in 7/22 with PASP 71 mmHg.  - V/Q scan (8/22): no evidence for chronic PE.  - PFTs (8/22): Minimal airways obstruction - RHC  (9/22) showed pulmonary venous hypertension.  2. Fe deficiency anemia: He had extensive GI evaluation in the past that was unrevealing.  Followed by Dr. Darnelle Catalan, has had Fe infusions.  3. Chronic atrial fibrillation: Diagnosed in 2017, failed DCCV in 2017 and appears to have been in atrial fibrillation since then.  4. Cirrhosis: 9/21 abdominal MRI showed cirrhosis.  No history of heavy ETOH, cause uncertain.  5. Chronic diastolic CHF: Echo (7/22) with EF 60-65%, normal RV, PASP 71 mmHg, severe biatrial enlargement, severe TR, probably moderate MR, IVC dilated.  6. Valvular heart disease: Echo in 7/22 with probably moderate MR, severe TR.  Suspect functional due to AF/dilated atria.  - TEE (9/22): EF 55-60%, RV moderately dilated with normal systolic function, moderate LAE, severe RAE, severe TR (functional) with peak RV-RA gradient 45 mmHg, severe highly eccentric mitral regurgitation with prolapse and partial flail of the P3 segment of the posterior leaflet.   - LHC/RHC (9/22): Nonobstructive mild CAD; mean RA 6, PA 47/15 mean 31, mean PCWP 15 with v-waves to 32, CI 3.32, PVR 2.5 WU.  - Failed Mitraclip in 10/22, successful Mitraclip in 11/22.  - Echo (12/22): EF 60-65%, D-shaped septum with moderate RV enlargement and mildly decreased systolic function, PASP 55 mmHg, severe biatrial enlargement, 2 Mitraclips with only 1 attached to both leaflets, mild MR with mean gradient 3 mmHg, severe TR, small iatrogenic secundum ASD, dilated IVC.  - Echo (3/23): EF 60-65%, D-shaped septum, mild RV dysfunction with mildly enlarged RV, PASP 50 mmHg, severe biatrial enlargement, mild MR s/p Mitraclip with mean gradient 4.5 mmHg, severe TR.  - tTEER as part of Triluminate study in 9/23.  - Post-tTEER echo in 9/23: EF 66%, severe RV dilation with low normal systolic function, 2 Mitraclips with mild-moderate MR, 2 Triclips with moderate TR, PASP 63 mmHg.  - Echo (10/23, [facility]): LVEF 62%, mild LVH, mild RV dilation with  normal systolic function, mild MR with 2 mitraclips and mean gradient 4, moderate TR with 2 triclips, PASP 42 mmHg.   - Cardiac MRI (11/23, [facility]): LV EF 73%, RV EF 53%, mild-moderate TR with regurgitant fraction 20%.  - Echo (10/24): LV EF 75%, moderate-severe TR with 2 Triclips, moderate MR with 2 Mitraclips mean gradient 3 mmHg, severe biatrial enlargement, mild RV dilation with mildly decreased RV systolic function.   7. Left renal mass: S/p left nephrectomy.  8. T-spine compression fracture.  9. Melanoma 10. H/o 6th nerve palsy: Episode around 2016.  Recurrent episode in 10/24, resolved after a couple of weeks.  - MRI 11/24: No evidence for CVA.  - Carotid dopplers (10/24): No significant disease.   SH: Widower, occasional ETOH (not heavy), nonsmoker.  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  [employer] graduate.   Family History  Problem Relation Age of Onset   Breast cancer Mother 37   Hypertension Father    Diabetes Neg Hx    Stroke Neg Hx    CAD Neg Hx    Colon cancer Neg Hx  Colon polyps Neg Hx    Esophageal cancer Neg Hx    Rectal cancer Neg Hx    Stomach cancer Neg Hx    ROS: All systems reviewed and negative except as per HPI.   Current Outpatient Medications  Medication Sig Dispense Refill   ALPRAZolam (XANAX) 0.25 MG tablet Take 1 tablet (0.25 mg total) by mouth every 6 (six) hours as needed for anxiety 30 tablet 0   apixaban (ELIQUIS) 5 MG TABS tablet Take 1 tablet (5 mg total) by mouth 2 (two) times daily. 180 tablet 3   Apoaequorin (PREVAGEN EXTRA STRENGTH PO) Take 1 tablet by mouth daily.     Barberry-Oreg Grape-Goldenseal (BERBERINE COMPLEX PO) Take 2,400 mg by mouth daily.     Cholecalciferol 50 MCG (2000 UT) CAPS take one capsule Orally daily     Chromium 1000 MCG TABS Take 1,000 mcg by mouth daily. chromium picolinate     Coenzyme Q10 (CO Q10) 200 MG CAPS Take 200 mg by mouth daily.     empagliflozin (JARDIANCE) 10 MG TABS tablet  Take 1 tablet (10 mg total) by mouth daily before breakfast. 30 tablet 11   ferrous gluconate (FERGON) 324 MG tablet Take 324 mg by mouth 3 (three) times a week.     furosemide (LASIX) 20 MG tablet Take 1 tablet (20 mg total) by mouth daily. 90 tablet 3   losartan (COZAAR) 25 MG tablet Take 1 tablet (25 mg total) by mouth daily. 90 tablet 3   MAGNESIUM GLYCINATE PO Take 525 mg by mouth in the morning and at bedtime.     Melatonin 10 MG TABS Take 10 mg by mouth at bedtime.     metoprolol succinate (TOPROL-XL) 50 MG 24 hr tablet Take 1 tablet (50 mg total) by mouth 2 (two) times daily. Take with or immediately following a meal 180 tablet 0   Misc Natural Products (ADVANCED JOINT RELIEF PO) Take 1 tablet by mouth 2 (two) times daily.     Multiple Vitamin (MULTIVITAMIN) capsule Take 1 capsule by mouth daily. Mega men 50 plus     Multiple Vitamins-Minerals (ZINC PO) Take by mouth daily.     NON FORMULARY Take 300 mg by mouth 2 (two) times daily. TRU vitamin  Nigen     omeprazole (PRILOSEC) 20 MG capsule Take 1 capsule (20 mg total) by mouth daily. 90 capsule 3   OVER THE COUNTER MEDICATION Take 2 capsules by mouth daily. Prostacare     OVER THE COUNTER MEDICATION Take 1 capsule by mouth 2 (two) times daily. U[employer]are Himalaya     OVER THE COUNTER MEDICATION Take 1 tablet by mouth 2 (two) times daily. virectin     OVER THE COUNTER MEDICATION Take 1 tablet by mouth 2 (two) times daily. Liver care     OVER THE COUNTER MEDICATION Take 1 tablet by mouth 2 (two) times daily. Tri Flex     OVER THE COUNTER MEDICATION Take 1 capsule by mouth 2 (two) times daily. conjugated linoleic acid     OVER THE COUNTER MEDICATION Take 1 tablet by mouth daily in the afternoon. CDB Gummies     OVER THE COUNTER MEDICATION Take 1 capsule by mouth daily in the afternoon. Hemp oil gummies     OVER THE COUNTER MEDICATION Take 1 tablet by mouth daily. arthrozene     Polyethyl Glycol-Propyl Glycol (SYSTANE) 0.4-0.3 % SOLN 1 drop  daily as needed (as needed for dry eye).     Sodium Hyaluronate, oral, (  HYALURONIC ACID) 100 MG CAPS Take 100 mg by mouth 2 (two) times daily.     spironolactone (ALDACTONE) 25 MG tablet Take 1 tablet (25 mg total) by mouth daily. 90 tablet 3   tadalafil (CIALIS) 20 MG tablet TAKE ONE TABLET BY MOUTH DAILY AS NEEDED 30 tablet 11   No current facility-administered medications for this encounter.   BP 100/60   Pulse 65   Wt 73.4 kg (161 lb 12.8 oz)   BMI 22.89 kg/m  General: NAD Neck: No JVD, no thyromegaly or thyroid nodule.  Lungs: Clear to auscultation bilaterally with normal respiratory effort. CV: Nondisplaced PMI.  Heart regular S1/S2 with widely split S2, no S3/S4, no murmur.  No peripheral edema.  No carotid bruit.  Normal pedal pulses.  Abdomen: Soft, nontender, no hepatosplenomegaly, no distention.  Skin: Intact without lesions or rashes.  Neurologic: Alert and oriented x 3.  Psych: Normal affect. Extremities: No clubbing or cyanosis.  HEENT: Normal.   Assessment/Plan: 1. Chronic diastolic CHF: Echo in 7/22 with EF 60-65%, normal RV, PASP 71 mmHg, severe biatrial enlargement, severe TR, probably moderate MR, IVC dilated.  In the summer of 2022, he developed symptomatic CHF, this has improved with diuresis and sodium restriction.  TEE showed severe functional TR and severe primary mitral regurgitation with partial flail posterior leaflet (P3).  I suspect that CHF is due to chronic atrial fibrillation + valvular disease (MR, TR).  He is now s/p Mitraclip, last echo in 12/22 showed EF 60-65%, D-shaped septum with moderate RV enlargement and mildly decreased systolic function, PASP 55 mmHg, severe biatrial enlargement, 2 Mitraclips with only 1 attached to both leaflets, mild MR with mean gradient 3 mmHg, severe TR, small iatrogenic secundum ASD, dilated IVC. Echo in 3/23 showed EF 60-65%, D-shaped septum, mild RV dysfunction with mildly enlarged RV, PASP 50 mmHg, severe biatrial  enlargement, mild MR s/p Mitraclip with mean gradient 4.5 mmHg, severe TR.  Patient then had tTEER in 9/23. Post-op echo showed EF 66%, severe RV dilation with low normal systolic function, 2 Mitraclips with mild-moderate MR, 2 Triclips with moderate TR, PASP 63 mmHg.  Echo at [facility] in 10/23 showed LVEF 62%, mild LVH, mild RV dilation with normal systolic function, mild MR with 2 mitraclips and mean gradient 4, moderate TR with 2 triclips, PASP 42 mmHg.  Cardiac MRI in 11/23 showed LV EF 73%, RV EF 53%, mild-moderate TR with regurgitant fraction 20%. Echo in 10/24 showed LV EF 75%, moderate-severe TR with 2 Triclips, moderate MR with 2 Mitraclips mean gradient 3 mmHg, severe biatrial enlargement, mild RV dilation with mildly decreased RV systolic function.  NYHA class I-II, not volume overloaded on exam.  - Continue Lasix 20 mg daily.   - Continue empagliflozin 10 mg daily.    - Continue spironolactone 25 mg daily. BMET/BNP today.  - He will have an echo done in [place] this fall.  2. Pulmonary hypertension: Pulmonary venous hypertension by 9/22 RHC.  PASP was 63 on post-tTEER echo and 42 mmHg on 10/23 echo.  This is improving, but he could have some residual PH due to pulmonary vascular remodeling in the setting of long-standing mitral valvular disease.  3. Atrial fibrillation: This is chronic and likely permanent (present since 2017).  The atria are severely dilated on echo. I do not think that he would be likely to hold NSR with DCCV, even with anti-arrhythmic.  - Continue Eliquis. CBC today.  - Good rate control on current Toprol XL.  4.  Valvular heart disease: He is s/p Mitraclip with 3/23 echo showing mild MR and mild MS, but he still had severe TR.  Symptomatically improved s/p Mitraclip.  With severe TR and RV changes already noted on echo (D-shaped septum, mild RV enlargement and hypokinesis), I was concerned that he could develop progressive RV failure over time. He then had tTEER with 2 Triclips  in 9/23 as part of Triluminate trial.  Most recent echo in 10/24 showed moderate MR s/p 2 Mitraclips and somewhat worse TR (moderate-severe) with 2 Triclips.  - He will have an echo in [place] again in the fall.   Followup in 4 months  I spent 32 minutes reviewing records, interviewing/examining patient, and managing orders.    Marca Ancona 06/16/2023

## 2023-06-28 ENCOUNTER — Other Ambulatory Visit (HOSPITAL_COMMUNITY): Payer: Self-pay

## 2023-08-05 ENCOUNTER — Other Ambulatory Visit: Payer: Self-pay

## 2023-08-05 ENCOUNTER — Other Ambulatory Visit (HOSPITAL_COMMUNITY): Payer: Self-pay

## 2023-08-07 ENCOUNTER — Other Ambulatory Visit (HOSPITAL_COMMUNITY): Payer: Self-pay | Admitting: Cardiology

## 2023-08-10 ENCOUNTER — Other Ambulatory Visit (HOSPITAL_COMMUNITY): Payer: Self-pay

## 2023-08-10 MED ORDER — METOPROLOL SUCCINATE ER 50 MG PO TB24
50.0000 mg | ORAL_TABLET | Freq: Two times a day (BID) | ORAL | 0 refills | Status: DC
  Filled 2023-08-10 – 2023-08-24 (×3): qty 180, 90d supply, fill #0

## 2023-08-22 ENCOUNTER — Other Ambulatory Visit (HOSPITAL_COMMUNITY): Payer: Self-pay

## 2023-08-24 ENCOUNTER — Other Ambulatory Visit (HOSPITAL_COMMUNITY): Payer: Self-pay

## 2023-09-23 ENCOUNTER — Other Ambulatory Visit (HOSPITAL_COMMUNITY): Payer: Self-pay

## 2023-09-26 ENCOUNTER — Encounter (HOSPITAL_COMMUNITY): Payer: Self-pay

## 2023-09-26 ENCOUNTER — Other Ambulatory Visit (HOSPITAL_COMMUNITY): Payer: Self-pay

## 2023-09-26 MED ORDER — ALPRAZOLAM 0.25 MG PO TABS
0.2500 mg | ORAL_TABLET | Freq: Four times a day (QID) | ORAL | 0 refills | Status: AC | PRN
  Filled 2023-09-26: qty 30, 30d supply, fill #0

## 2023-10-24 ENCOUNTER — Other Ambulatory Visit: Payer: Self-pay

## 2023-10-24 ENCOUNTER — Other Ambulatory Visit (HOSPITAL_COMMUNITY): Payer: Self-pay

## 2023-11-07 ENCOUNTER — Other Ambulatory Visit: Payer: Self-pay

## 2023-11-07 ENCOUNTER — Other Ambulatory Visit (HOSPITAL_COMMUNITY): Payer: Self-pay

## 2023-11-07 ENCOUNTER — Other Ambulatory Visit (HOSPITAL_COMMUNITY): Payer: Self-pay | Admitting: Cardiology

## 2023-11-07 MED ORDER — LOSARTAN POTASSIUM 25 MG PO TABS
25.0000 mg | ORAL_TABLET | Freq: Every day | ORAL | 3 refills | Status: AC
  Filled 2023-11-07: qty 90, 90d supply, fill #0
  Filled 2024-02-07: qty 90, 90d supply, fill #1
  Filled 2024-05-01: qty 90, 90d supply, fill #2

## 2023-11-25 ENCOUNTER — Other Ambulatory Visit (HOSPITAL_COMMUNITY): Payer: Self-pay

## 2023-11-25 ENCOUNTER — Other Ambulatory Visit (HOSPITAL_COMMUNITY): Payer: Self-pay | Admitting: Cardiology

## 2023-11-25 MED ORDER — METOPROLOL SUCCINATE ER 50 MG PO TB24
50.0000 mg | ORAL_TABLET | Freq: Two times a day (BID) | ORAL | 0 refills | Status: DC
  Filled 2023-11-25: qty 180, 90d supply, fill #0

## 2023-12-07 ENCOUNTER — Telehealth (HOSPITAL_COMMUNITY): Payer: Self-pay | Admitting: Cardiology

## 2023-12-07 NOTE — Telephone Encounter (Signed)
 Called to confirm/remind patient of their appointment at the [facility] on 12/07/2023.   Appointment:   [x] Confirmed  [] Left mess   [] No answer/No voice mail  [] VM Full/unable to leave message  [] Phone not in service  Patient reminded to bring all medications and/or complete list.  Confirmed patient has transportation. Gave directions, instructed to utilize valet parking.

## 2023-12-08 ENCOUNTER — Encounter (HOSPITAL_COMMUNITY): Payer: Self-pay | Admitting: Cardiology

## 2023-12-08 ENCOUNTER — Ambulatory Visit (HOSPITAL_COMMUNITY)
Admission: RE | Admit: 2023-12-08 | Discharge: 2023-12-08 | Disposition: A | Discharge status: Home / Self Care | Source: Ambulatory Visit | Attending: Cardiology | Admitting: Cardiology

## 2023-12-08 ENCOUNTER — Ambulatory Visit (HOSPITAL_COMMUNITY): Payer: Self-pay | Admitting: Cardiology

## 2023-12-08 VITALS — BP 110/72 | HR 71 | Ht 71.0 in | Wt 163.4 lb

## 2023-12-08 DIAGNOSIS — T8203XA Leakage of heart valve prosthesis, initial encounter: Secondary | ICD-10-CM | POA: Diagnosis not present

## 2023-12-08 DIAGNOSIS — D509 Iron deficiency anemia, unspecified: Secondary | ICD-10-CM | POA: Diagnosis not present

## 2023-12-08 DIAGNOSIS — Z7984 Long term (current) use of oral hypoglycemic drugs: Secondary | ICD-10-CM | POA: Diagnosis not present

## 2023-12-08 DIAGNOSIS — Z79899 Other long term (current) drug therapy: Secondary | ICD-10-CM | POA: Diagnosis not present

## 2023-12-08 DIAGNOSIS — Z7901 Long term (current) use of anticoagulants: Secondary | ICD-10-CM | POA: Diagnosis not present

## 2023-12-08 DIAGNOSIS — Z952 Presence of prosthetic heart valve: Secondary | ICD-10-CM | POA: Diagnosis not present

## 2023-12-08 DIAGNOSIS — I272 Pulmonary hypertension, unspecified: Secondary | ICD-10-CM | POA: Insufficient documentation

## 2023-12-08 DIAGNOSIS — I5032 Chronic diastolic (congestive) heart failure: Secondary | ICD-10-CM | POA: Insufficient documentation

## 2023-12-08 DIAGNOSIS — I482 Chronic atrial fibrillation, unspecified: Secondary | ICD-10-CM | POA: Diagnosis not present

## 2023-12-08 LAB — BASIC METABOLIC PANEL WITH GFR
Anion gap: 9 (ref 5–15)
BUN: 28 mg/dL — ABNORMAL HIGH (ref 8–23)
CO2: 26 mmol/L (ref 22–32)
Calcium: 9.2 mg/dL (ref 8.9–10.3)
Chloride: 102 mmol/L (ref 98–111)
Creatinine, Ser: 1.18 mg/dL (ref 0.61–1.24)
GFR, Estimated: 59 mL/min — ABNORMAL LOW (ref >60)
Glucose, Bld: 94 mg/dL (ref 70–99)
Potassium: 4 mmol/L (ref 3.5–5.1)
Sodium: 137 mmol/L (ref 135–145)

## 2023-12-08 LAB — BRAIN NATRIURETIC PEPTIDE: B Natriuretic Peptide: 375.5 pg/mL — ABNORMAL HIGH (ref 0.0–100.0)

## 2023-12-08 NOTE — Progress Notes (Addendum)
 PCP: Shepard Ade, MD HF Cardiology: Dr. Rolan  Chief complaint: CHF  89 y.o. with history of chronic atrial fibrillation, diastolic CHF, pulmonary hypertension, [name] was referred by Dr. Alveta for evaluation of CHF and pulmonary hypertension. Patient was found to be in atrial fibrillation back in 2017.  At that time, he had a cardioversion.  This did not hold, and he has been in atrial fibrillation since 2017 chronically as far as I can tell. He has had Fe deficiency anemia requiring Fe infusions, GI workup was unrevealing.  Also of note, he has cirrhosis by 9/21 MRI abdomen, cause uncertain (not a heavy drinker).   In the summer of 2022, his legs began to swell significantly and developed weeping sores.  He developed significant dyspnea.  He was seen in the cardiology clinic and started on Lasix .  He cut back on sodium.  Echo in 7/22 showed EF 60-65%, normal RV, PASP 71 mmHg, severe biatrial enlargement, severe TR, probably moderate MR, IVC dilated. Over time, his symptoms haves improved.   TEE in 9/22 showed severe TR (functional) and severe MR (prolapse/partial P3 flail).  LHC/RHC in 9/22 showed minimal CAD, mildly elevated PCWP.     Patient had Mitraclip placed in 10/22 but the device failed the day after with device only attached to one leaflet.  Patient had redo Mitraclip in 11/22, this time it was successful with new clip adjacent to original clip.  Echo in 12/22 showed EF 60-65%, D-shaped septum with moderate RV enlargement and mildly decreased systolic function, PASP 55 mmHg, severe biatrial enlargement, 2 Mitraclips with only 1 attached to both leaflets, mild MR with mean gradient 3 mmHg, severe TR, small iatrogenic secundum ASD, dilated IVC.   Echo in 3/23 showed EF 60-65%, D-shaped septum, mild RV dysfunction with mildly enlarged RV, PASP 50 mmHg, severe biatrial enlargement, mild MR s/p Mitraclip with mean gradient 4.5 mmHg, severe TR.   Patient had tTEER as part  of Triluminate study on 02/10/22.  2 clips were placed . Post-procedure echo in 9/23 showed EF 66%, severe RV dilation with low normal systolic function, 2 Mitraclips with mild-moderate MR, 2 Triclips with moderate TR, PASP 63 mmHg.   Echo at [facility] in 10/23 showed LVEF 62%, mild LVH, mild RV dilation with normal systolic function, mild MR with 2 mitraclips and mean gradient 4, moderate TR with 2 triclips, PASP 42 mmHg.  Cardiac MRI in 11/23 showed LV EF 73%, RV EF 53%, mild-moderate TR with regurgitant fraction 20%.   Echo in 10/24 showed LV EF 75%, moderate-severe TR with 2 Triclips, moderate MR with 2 Mitraclips mean gradient 3 mmHg, severe biatrial enlargement, mild RV dilation with mildly decreased RV systolic function.    Onset of diplopia in 10/24.  He had had a prior episode of 6th nerve palsy.  He saw a neurologist, MRI of brain in 11/24 showed no CVA.  Carotid dopplers in 10/24 showed no significant disease. The diplopia has resolved, no definite cause.   Patient returns for followup of CHF and mitral regurgitation/tricuspid regurgitation.  He is playing tennis twice a week.  No significant exertional dyspnea, says he has been playing better recently.  No chest pain.  No lightheadedness.  He is in permanent atrial fibrillation but does not feel palpitations.  No orthopnea/PND.  No BRBPR/melena. Weight up 2 lbs.   ECG (personally reviewed): atrial fibrillation, IVCD 122 msec  Labs (10/24): K 4.3, creatinine 1.02, NT-proBNP 1457, hgb 15.7 Labs (11/24): K 4.2, creatinine  1.06 Labs (1/25): LDL 109, BNP 308, hgb 14.8, K 3.8, creatinine 1.3  PMH: 1. Pulmonary hypertension: Echo in 7/22 with PASP 71 mmHg.  - V/Q scan (8/22): no evidence for chronic PE.  - PFTs (8/22): Minimal airways obstruction - RHC (9/22) showed pulmonary venous hypertension.  2. Fe deficiency anemia: He had extensive GI evaluation in the past that was unrevealing.  Followed by Dr. Magrinat, has had Fe infusions.  3. Chronic  atrial fibrillation: Diagnosed in 2017, failed DCCV in 2017 and appears to have been in atrial fibrillation since then.  4. Cirrhosis: 9/21 abdominal MRI showed cirrhosis.  No history of heavy ETOH, cause uncertain.  5. Chronic diastolic CHF: Echo (7/22) with EF 60-65%, normal RV, PASP 71 mmHg, severe biatrial enlargement, severe TR, probably moderate MR, IVC dilated.  6. Valvular heart disease: Echo in 7/22 with probably moderate MR, severe TR.  Suspect functional due to AF/dilated atria.  - TEE (9/22): EF 55-60%, RV moderately dilated with normal systolic function, moderate LAE, severe RAE, severe TR (functional) with peak RV-RA gradient 45 mmHg, severe highly eccentric mitral regurgitation with prolapse and partial flail of the P3 segment of the posterior leaflet.   - LHC/RHC (9/22): Nonobstructive mild CAD; mean RA 6, PA 47/15 mean 31, mean PCWP 15 with v-waves to 32, CI 3.32, PVR 2.5 WU.  - Failed Mitraclip in 10/22, successful Mitraclip in 11/22.  - Echo (12/22): EF 60-65%, D-shaped septum with moderate RV enlargement and mildly decreased systolic function, PASP 55 mmHg, severe biatrial enlargement, 2 Mitraclips with only 1 attached to both leaflets, mild MR with mean gradient 3 mmHg, severe TR, small iatrogenic secundum ASD, dilated IVC.  - Echo (3/23): EF 60-65%, D-shaped septum, mild RV dysfunction with mildly enlarged RV, PASP 50 mmHg, severe biatrial enlargement, mild MR s/p Mitraclip with mean gradient 4.5 mmHg, severe TR.  - tTEER as part of Triluminate study in 9/23.  - Post-tTEER echo in 9/23: EF 66%, severe RV dilation with low normal systolic function, 2 Mitraclips with mild-moderate MR, 2 Triclips with moderate TR, PASP 63 mmHg.  - Echo (10/23, [facility]): LVEF 62%, mild LVH, mild RV dilation with normal systolic function, mild MR with 2 mitraclips and mean gradient 4, moderate TR with 2 triclips, PASP 42 mmHg.   - Cardiac MRI (11/23, [facility]): LV EF 73%, RV EF 53%, mild-moderate TR with  regurgitant fraction 20%.  - Echo (10/24): LV EF 75%, moderate-severe TR with 2 Triclips, moderate MR with 2 Mitraclips mean gradient 3 mmHg, severe biatrial enlargement, mild RV dilation with mildly decreased RV systolic function.   7. Left renal mass: S/p left nephrectomy.  8. T-spine compression fracture.  9. Melanoma 10. H/o 6th nerve palsy: Episode around 2016.  Recurrent episode in 10/24, resolved after a couple of weeks.  - MRI 11/24: No evidence for CVA.  - Carotid dopplers (10/24): No significant disease.   SH: Widower, occasional ETOH (not heavy), nonsmoker.  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  [employer] graduate.   Family History  Problem Relation Age of Onset   Breast cancer Mother 42   Hypertension Father    Diabetes Neg Hx    Stroke Neg Hx    CAD Neg Hx    Colon cancer Neg Hx    Colon polyps Neg Hx    Esophageal cancer Neg Hx    Rectal cancer Neg Hx    Stomach cancer Neg Hx    ROS:  All systems reviewed and negative except as per HPI.   Current Outpatient Medications  Medication Sig Dispense Refill   ALPRAZolam  (XANAX ) 0.25 MG tablet Take 1 tablet (0.25 mg total) by mouth every 6 (six) hours as needed for anxiety 30 tablet 0   apixaban  (ELIQUIS ) 5 MG TABS tablet Take 1 tablet (5 mg total) by mouth 2 (two) times daily. 180 tablet 3   Apoaequorin (PREVAGEN EXTRA STRENGTH PO) Take 1 tablet by mouth daily.     Barberry-Oreg Grape-Goldenseal (BERBERINE COMPLEX PO) Take 2,400 mg by mouth daily.     Cholecalciferol 50 MCG (2000 UT) CAPS take one capsule Orally daily     Chromium 1000 MCG TABS Take 1,000 mcg by mouth daily. chromium picolinate     Coenzyme Q10 (CO Q10) 200 MG CAPS Take 200 mg by mouth daily.     empagliflozin  (JARDIANCE ) 10 MG TABS tablet Take 1 tablet (10 mg total) by mouth daily before breakfast. 30 tablet 11   ferrous gluconate  (FERGON) 324 MG tablet Take 324 mg by mouth 3 (three) times a week.     furosemide  (LASIX ) 20  MG tablet Take 1 tablet (20 mg total) by mouth daily. 90 tablet 3   losartan  (COZAAR ) 25 MG tablet Take 1 tablet (25 mg total) by mouth daily. 90 tablet 3   MAGNESIUM  GLYCINATE PO Take 525 mg by mouth in the morning and at bedtime.     Melatonin 10 MG TABS Take 10 mg by mouth at bedtime.     metoprolol  succinate (TOPROL -XL) 50 MG 24 hr tablet Take 1 tablet (50 mg total) by mouth 2 (two) times daily. Take with or immediately following a meal 180 tablet 0   Misc Natural Products (ADVANCED JOINT RELIEF PO) Take 1 tablet by mouth 2 (two) times daily.     Multiple Vitamin (MULTIVITAMIN) capsule Take 1 capsule by mouth daily. Mega men 50 plus     Multiple Vitamins-Minerals (ZINC PO) Take by mouth daily.     NON FORMULARY Take 300 mg by mouth 2 (two) times daily. TRU vitamin  Nigen     omeprazole  (PRILOSEC) 20 MG capsule Take 1 capsule (20 mg total) by mouth daily. 90 capsule 3   OVER THE COUNTER MEDICATION Take 2 capsules by mouth daily. Prostacare     OVER THE COUNTER MEDICATION Take 1 capsule by mouth 2 (two) times daily. Uricare Himalaya     OVER THE COUNTER MEDICATION Take 1 tablet by mouth 2 (two) times daily. Liver care     OVER THE COUNTER MEDICATION Take 1 tablet by mouth 2 (two) times daily. Tri Flex     OVER THE COUNTER MEDICATION Take 1 capsule by mouth 2 (two) times daily. conjugated linoleic acid     OVER THE COUNTER MEDICATION Take 1 tablet by mouth daily in the afternoon. CDB Gummies     OVER THE COUNTER MEDICATION Take 1 capsule by mouth daily in the afternoon. Hemp oil gummies     OVER THE COUNTER MEDICATION Take 1 tablet by mouth daily. arthrozene     Polyethyl Glycol-Propyl Glycol (SYSTANE) 0.4-0.3 % SOLN 1 drop daily as needed (as needed for dry eye).     Sodium Hyaluronate, oral, (HYALURONIC ACID) 100 MG CAPS Take 100 mg by mouth 2 (two) times daily.     spironolactone  (ALDACTONE ) 25 MG tablet Take 1 tablet (25 mg total) by mouth daily. 90 tablet 3   tadalafil (CIALIS) 20 MG  tablet TAKE ONE TABLET BY MOUTH  DAILY AS NEEDED 30 tablet 11   OVER THE COUNTER MEDICATION Take 1 tablet by mouth 2 (two) times daily. virectin     No current facility-administered medications for this encounter.   BP 110/72   Pulse 71   Ht 5' 11 (1.803 m)   Wt 74.1 kg (163 lb 6.4 oz)   SpO2 96%   BMI 22.79 kg/m  General: NAD Neck: No JVD, no thyromegaly or thyroid  nodule.  Lungs: Clear to auscultation bilaterally with normal respiratory effort. CV: Nondisplaced PMI.  Heart irregular S1/S2, no S3/S4, no murmur.  No peripheral edema.  No carotid bruit.  Normal pedal pulses.  Abdomen: Soft, nontender, no hepatosplenomegaly, no distention.  Skin: Intact without lesions or rashes.  Neurologic: Alert and oriented x 3.  Psych: Normal affect. Extremities: No clubbing or cyanosis.  HEENT: Normal.   Assessment/Plan: 1. Chronic diastolic CHF: Echo in 7/22 with EF 60-65%, normal RV, PASP 71 mmHg, severe biatrial enlargement, severe TR, probably moderate MR, IVC dilated.  In the summer of 2022, he developed symptomatic CHF, this has improved with diuresis and sodium restriction.  TEE showed severe functional TR and severe primary mitral regurgitation with partial flail posterior leaflet (P3).  I suspect that CHF is due to chronic atrial fibrillation + valvular disease (MR, TR).  He is now s/p Mitraclip, last echo in 12/22 showed EF 60-65%, D-shaped septum with moderate RV enlargement and mildly decreased systolic function, PASP 55 mmHg, severe biatrial enlargement, 2 Mitraclips with only 1 attached to both leaflets, mild MR with mean gradient 3 mmHg, severe TR, small iatrogenic secundum ASD, dilated IVC. Echo in 3/23 showed EF 60-65%, D-shaped septum, mild RV dysfunction with mildly enlarged RV, PASP 50 mmHg, severe biatrial enlargement, mild MR s/p Mitraclip with mean gradient 4.5 mmHg, severe TR.  Patient then had tTEER in 9/23. Post-op echo showed EF 66%, severe RV dilation with low normal systolic  function, 2 Mitraclips with mild-moderate MR, 2 Triclips with moderate TR, PASP 63 mmHg.  Echo at [facility] in 10/23 showed LVEF 62%, mild LVH, mild RV dilation with normal systolic function, mild MR with 2 mitraclips and mean gradient 4, moderate TR with 2 triclips, PASP 42 mmHg.  Cardiac MRI in 11/23 showed LV EF 73%, RV EF 53%, mild-moderate TR with regurgitant fraction 20%. Echo in 10/24 showed LV EF 75%, moderate-severe TR with 2 Triclips, moderate MR with 2 Mitraclips mean gradient 3 mmHg, severe biatrial enlargement, mild RV dilation with mildly decreased RV systolic function.  NYHA class I-II, not volume overloaded on exam.  - Continue Lasix  20 mg daily.  BMET/BNP today.  - Continue empagliflozin  10 mg daily.    - Continue spironolactone  25 mg daily.  - He will have an echo done in [place] in 9/23.  2. Pulmonary hypertension: Pulmonary venous hypertension by 9/22 RHC.  PASP was 63 on post-tTEER echo and 42 mmHg on 10/23 echo.  This is improving, but he could have some residual PH due to pulmonary vascular remodeling in the setting of long-standing mitral valvular disease.  3. Atrial fibrillation: This is chronic and likely permanent (present since 2017).  The atria are severely dilated on echo. I do not think that he would be likely to hold NSR with DCCV, even with anti-arrhythmic.  - Continue Eliquis . CBC today.  - Good rate control on current Toprol  XL.  4. Valvular heart disease: He is s/p Mitraclip with 3/23 echo showing mild MR and mild MS, but he still had severe TR.  Symptomatically improved s/p Mitraclip.  With severe TR and RV changes already noted on echo (D-shaped septum, mild RV enlargement and hypokinesis), I was concerned that he could develop progressive RV failure over time. He then had tTEER with 2 Triclips in 9/23 as part of Triluminate trial.  Most recent echo in 10/24 showed moderate MR s/p 2 Mitraclips and somewhat worse TR (moderate-severe) with 2 Triclips.  - He will have an echo  in [place] in 9/23  Followup in 6 months  I spent 22 minutes reviewing records, interviewing/examining patient, and managing orders.    Ezra Shuck 12/08/2023

## 2023-12-08 NOTE — Patient Instructions (Signed)
 There has been no changes to your medications.  Labs done today, your results will be available in MyChart, we will contact you for abnormal readings.  Your physician recommends that you schedule a follow-up appointment in: 6 months ( January 2026) ** PLEASE CALL THE OFFICE IN NOVEMBER TO ARRANGE YOUR FOLLOW UP APPOINTMENT.**  If you have any questions or concerns before your next appointment please send us  a message through [place] or call our office at [phone].    TO LEAVE A MESSAGE FOR THE NURSE SELECT OPTION 2, PLEASE LEAVE A MESSAGE INCLUDING: YOUR NAME DATE OF BIRTH CALL BACK NUMBER REASON FOR CALL**this is important as we prioritize the call backs  YOU WILL RECEIVE A CALL BACK THE SAME DAY AS LONG AS YOU CALL BEFORE 4:00 PM  At the [facility], you and your health needs are our priority. As part of our continuing mission to provide you with exceptional heart care, we have created designated Provider Care Teams. These Care Teams include your primary Cardiologist (physician) and Advanced Practice Providers (APPs- Physician Assistants and Nurse Practitioners) who all work together to provide you with the care you need, when you need it.   You may see any of the following providers on your designated Care Team at your next follow up: Dr Toribio Fuel Dr Ezra Shuck Dr. Ria Commander Dr. Morene Brownie Amy Lenetta, NP Caffie Shed, [place] [facility] [place], [place] Beckey Coe, NP [place] Lee, NP Ellouise Class, NP Tinnie Redman, PharmD Jaun Bash, PharmD   Please be sure to bring in all your medications bottles to every appointment.    Thank you for choosing [facility]

## 2023-12-14 DIAGNOSIS — L814 Other melanin hyperpigmentation: Secondary | ICD-10-CM | POA: Diagnosis not present

## 2023-12-14 DIAGNOSIS — D1801 Hemangioma of skin and subcutaneous tissue: Secondary | ICD-10-CM | POA: Diagnosis not present

## 2023-12-14 DIAGNOSIS — L570 Actinic keratosis: Secondary | ICD-10-CM | POA: Diagnosis not present

## 2023-12-14 DIAGNOSIS — L819 Disorder of pigmentation, unspecified: Secondary | ICD-10-CM | POA: Diagnosis not present

## 2023-12-14 DIAGNOSIS — Z85828 Personal history of other malignant neoplasm of skin: Secondary | ICD-10-CM | POA: Diagnosis not present

## 2023-12-14 DIAGNOSIS — L821 Other seborrheic keratosis: Secondary | ICD-10-CM | POA: Diagnosis not present

## 2023-12-14 DIAGNOSIS — D692 Other nonthrombocytopenic purpura: Secondary | ICD-10-CM | POA: Diagnosis not present

## 2023-12-14 DIAGNOSIS — C44519 Basal cell carcinoma of skin of other part of trunk: Secondary | ICD-10-CM | POA: Diagnosis not present

## 2024-01-09 ENCOUNTER — Other Ambulatory Visit (HOSPITAL_COMMUNITY): Payer: Self-pay

## 2024-01-12 ENCOUNTER — Emergency Department (HOSPITAL_BASED_OUTPATIENT_CLINIC_OR_DEPARTMENT_OTHER)

## 2024-01-12 ENCOUNTER — Other Ambulatory Visit: Payer: Self-pay

## 2024-01-12 ENCOUNTER — Emergency Department (HOSPITAL_BASED_OUTPATIENT_CLINIC_OR_DEPARTMENT_OTHER)
Admission: EM | Admit: 2024-01-12 | Discharge: 2024-01-12 | Disposition: A | Discharge status: Home / Self Care | Attending: Emergency Medicine | Admitting: Emergency Medicine

## 2024-01-12 ENCOUNTER — Encounter (HOSPITAL_BASED_OUTPATIENT_CLINIC_OR_DEPARTMENT_OTHER): Payer: Self-pay | Admitting: Emergency Medicine

## 2024-01-12 DIAGNOSIS — Z7901 Long term (current) use of anticoagulants: Secondary | ICD-10-CM | POA: Insufficient documentation

## 2024-01-12 DIAGNOSIS — S52352A Displaced comminuted fracture of shaft of radius, left arm, initial encounter for closed fracture: Secondary | ICD-10-CM | POA: Diagnosis not present

## 2024-01-12 DIAGNOSIS — S52615A Nondisplaced fracture of left ulna styloid process, initial encounter for closed fracture: Secondary | ICD-10-CM | POA: Diagnosis not present

## 2024-01-12 DIAGNOSIS — S52612A Displaced fracture of left ulna styloid process, initial encounter for closed fracture: Secondary | ICD-10-CM | POA: Diagnosis not present

## 2024-01-12 DIAGNOSIS — W19XXXA Unspecified fall, initial encounter: Secondary | ICD-10-CM | POA: Diagnosis not present

## 2024-01-12 DIAGNOSIS — S6992XA Unspecified injury of left wrist, hand and finger(s), initial encounter: Secondary | ICD-10-CM | POA: Diagnosis present

## 2024-01-12 DIAGNOSIS — S52532A Colles' fracture of left radius, initial encounter for closed fracture: Secondary | ICD-10-CM

## 2024-01-12 DIAGNOSIS — Z79899 Other long term (current) drug therapy: Secondary | ICD-10-CM | POA: Insufficient documentation

## 2024-01-12 DIAGNOSIS — M19032 Primary osteoarthritis, left wrist: Secondary | ICD-10-CM | POA: Diagnosis not present

## 2024-01-12 DIAGNOSIS — S52502A Unspecified fracture of the lower end of left radius, initial encounter for closed fracture: Secondary | ICD-10-CM | POA: Diagnosis not present

## 2024-01-12 MED ORDER — HYDROCODONE-ACETAMINOPHEN 5-325 MG PO TABS
0.5000 | ORAL_TABLET | Freq: Four times a day (QID) | ORAL | 0 refills | Status: DC | PRN
  Filled 2024-01-12: qty 10, 5d supply, fill #0

## 2024-01-12 MED ORDER — HYDROCODONE-ACETAMINOPHEN 5-325 MG PO TABS
0.5000 | ORAL_TABLET | Freq: Once | ORAL | Status: AC
  Administered 2024-01-12: 0.5 via ORAL
  Filled 2024-01-12: qty 1

## 2024-01-12 NOTE — ED Provider Notes (Signed)
 [place] EMERGENCY DEPARTMENT AT [facility] Provider Note   CSN: [id number] Arrival date & time: 01/12/24  8073     Patient presents with: Wrist Pain   John Holder is a 89 y.o. male.   Patient is an 89 year old male who presents to Emergency Department with a chief complaint of pain to the left wrist.  Patient notes he was playing tennis just prior to arrival when he fell onto an outstretched wrist.  Patient denies any other secondary sites of injury or pain.  He notes he did not strike his head during the fall.  He denies any associated numbness or paresthesias distally.  He is still able to move all digits distally.  He denies any long bone or joint pain at this point.  He denies any pain to his neck or back.   Wrist Pain       Prior to Admission medications   Medication Sig Start Date End Date Taking? Authorizing Provider  ALPRAZolam  (XANAX ) 0.25 MG tablet Take 1 tablet (0.25 mg total) by mouth every 6 (six) hours as needed for anxiety 09/26/23     apixaban  (ELIQUIS ) 5 MG TABS tablet Take 1 tablet (5 mg total) by mouth 2 (two) times daily. 03/30/23   Rolan Ezra RAMAN, MD  Apoaequorin [facility] EXTRA STRENGTH PO) Take 1 tablet by mouth daily. 10/16/18   [provider]  Barberry-Oreg Grape-Goldenseal (BERBERINE COMPLEX PO) Take 2,400 mg by mouth daily.    [provider]  Cholecalciferol 50 MCG (2000 UT) CAPS take one capsule Orally daily    [provider]  Chromium 1000 MCG TABS Take 1,000 mcg by mouth daily. chromium picolinate    [provider]  Coenzyme Q10 (CO Q10) 200 MG CAPS Take 200 mg by mouth daily. 03/06/04   [provider]  empagliflozin  (JARDIANCE ) 10 MG TABS tablet Take 1 tablet (10 mg total) by mouth daily before breakfast. 03/09/23   Rolan Ezra RAMAN, MD  ferrous gluconate  [facility]) 324 MG tablet Take 324 mg by mouth 3 (three) times a week.    [provider]  furosemide  (LASIX ) 20 MG tablet Take 1  tablet (20 mg total) by mouth daily. 03/09/23   Rolan Ezra RAMAN, MD  losartan  (COZAAR ) 25 MG tablet Take 1 tablet (25 mg total) by mouth daily. 11/07/23   Rolan Ezra RAMAN, MD  MAGNESIUM  GLYCINATE PO Take 525 mg by mouth in the morning and at bedtime. 06/16/18   [provider]  Melatonin 10 MG TABS Take 10 mg by mouth at bedtime. 03/06/04   [provider]  metoprolol  succinate (TOPROL -XL) 50 MG 24 hr tablet Take 1 tablet (50 mg total) by mouth 2 (two) times daily. Take with or immediately following a meal 11/25/23   Rolan Ezra RAMAN, MD  Misc Natural Products (ADVANCED JOINT RELIEF PO) Take 1 tablet by mouth 2 (two) times daily.    [provider]  Multiple Vitamin (MULTIVITAMIN) capsule Take 1 capsule by mouth daily. Mega men 50 plus    [provider]  Multiple Vitamins-Minerals (ZINC PO) Take by mouth daily.    [provider]  NON FORMULARY Take 300 mg by mouth 2 (two) times daily. TRU vitamin  Nigen    [provider]  omeprazole  (PRILOSEC) 20 MG capsule Take 1 capsule (20 mg total) by mouth daily. 05/04/23     OVER THE COUNTER MEDICATION Take 2 capsules by mouth daily. Prostacare    [provider]  OVER  THE COUNTER MEDICATION Take 1 capsule by mouth 2 (two) times daily. [employer], Historical, MD  OVER THE COUNTER MEDICATION Take 1 tablet by mouth 2 (two) times daily. virectin    [provider]  OVER THE COUNTER MEDICATION Take 1 tablet by mouth 2 (two) times daily. Liver care    [provider]  OVER THE COUNTER MEDICATION Take 1 tablet by mouth 2 (two) times daily. Tri Flex    [provider]  OVER THE COUNTER MEDICATION Take 1 capsule by mouth 2 (two) times daily. conjugated linoleic acid    [provider]  OVER THE COUNTER MEDICATION Take 1 tablet by mouth daily in the afternoon. CDB Gummies    [provider]  OVER THE COUNTER MEDICATION Take 1 capsule by mouth  daily in the afternoon. Hemp oil gummies    [provider]  OVER THE COUNTER MEDICATION Take 1 tablet by mouth daily. arthrozene    [provider]  Polyethyl Glycol-Propyl Glycol (SYSTANE) 0.4-0.3 % SOLN 1 drop daily as needed (as needed for dry eye).    [provider]  Sodium Hyaluronate, oral, (HYALURONIC ACID) 100 MG CAPS Take 100 mg by mouth 2 (two) times daily.    [provider]  spironolactone  (ALDACTONE ) 25 MG tablet Take 1 tablet (25 mg total) by mouth daily. 03/09/23   Rolan Ezra RAMAN, MD  tadalafil (CIALIS) 20 MG tablet TAKE ONE TABLET BY MOUTH DAILY AS NEEDED 03/10/20   Wrenn, John, MD    Allergies: Patient has no known allergies.    Review of Systems  Musculoskeletal:        Pain to the left wrist  All other systems reviewed and are negative.   Updated Vital Signs BP (!) 152/92 (BP Location: Right Arm)   Pulse 78   Temp 98.1 F (36.7 C) (Oral)   Resp 18   Ht 5' 11 (1.803 m)   Wt 73.9 kg   SpO2 98%   BMI 22.73 kg/m   Physical Exam Vitals and nursing note reviewed.  Constitutional:      Appearance: Normal appearance.  HENT:     Head: Normocephalic and atraumatic.  Eyes:     Extraocular Movements: Extraocular movements intact.     Conjunctiva/sclera: Conjunctivae normal.     Pupils: Pupils are equal, round, and reactive to light.  Cardiovascular:     Rate and Rhythm: Normal rate and regular rhythm.     Pulses: Normal pulses.     Heart sounds: Normal heart sounds.  Pulmonary:     Effort: Pulmonary effort is normal. No respiratory distress.     Breath sounds: Normal breath sounds.  Musculoskeletal:        General: Normal range of motion.     Cervical back: Normal range of motion and neck supple. No rigidity or tenderness.     Comments: Tender to palpation noted over the left wrist diffusely with associated swelling, nontender palpation over the left hand, elbow, shoulder, full range of motion noted in distal digits,  sensation intact distally, cap refill less than 2 seconds distally, radial pulses 2+ upper extremities, limited active and passive range of motion of left wrist secondary to pain and swelling  Skin:    General: Skin is warm and dry.  Neurological:     General: No focal deficit present.     Mental Status: He is alert and oriented to person, place, and time. Mental status is at baseline.  Psychiatric:        Mood and Affect: Mood normal.        Behavior: Behavior normal.        Thought Content: Thought content normal.        Judgment: Judgment normal.     (all labs ordered are listed, but only abnormal results are displayed) Labs Reviewed - No data to display  EKG: None  Radiology: No results found.   Procedures   Medications Ordered in the ED - No data to display                                  Medical Decision Making Patient is doing well at this time and is stable for discharge home.  Discussed with patient he does have a fracture to the left distal radius as well as the ulnar styloid.  Given the comminuted nature of the fracture do not suspect that it would be amenable to reduction at this point.  He is neurovascularly intact distally before and after splint placement.  He has been placed in a sugar-tong splint.  Will continue treatment of his pain on an outpatient basis.  He had no other secondary sites of injury or pain and did not strike his head during the fall.  Discussed with patient about the importance of close follow-up with orthopedics on an outpatient basis and resources were provided.  He was directed to leave the splint in place until evaluated by orthopedics.  Strict return precautions were provided for any new or worsening symptoms.  Patient voiced understanding and had no additional questions.  Patient was evaluated by attending physician who is in agreement of plan as well.  Amount and/or Complexity of Data Reviewed Radiology: ordered.  Risk Prescription drug  management.        Final diagnoses:  None    ED Discharge Orders     None          Daralene Lonni JONETTA DEVONNA 01/12/24 2107    Armenta Canning, MD 01/23/24 [phone]

## 2024-01-12 NOTE — ED Provider Notes (Signed)
 I provided a substantive portion of the care of this patient.  I personally made/approved the management plan for this patient and take responsibility for the patient management.     Patient with fall on outstretched wrist.  He experienced pain and deformity of the left wrist.  No other significant injury.  He reports that the wrist does not move it is not very painful.  Patient is alert.  No acute distress.  Patient has dinner fork deformity of the left wrist.  Radial pulses 2+ and strong.  Hand is warm and dry.  Intact neurologic function.  Agree with splinting and orthopedic follow-up.   Armenta Canning, MD 01/12/24 2000

## 2024-01-12 NOTE — Discharge Instructions (Addendum)
 Please follow-up closely with orthopedics on an outpatient basis.  Please call to make an appointment.  Please leave the splint in place until evaluated by orthopedics.  Please do not get the splint wet.

## 2024-01-12 NOTE — ED Triage Notes (Signed)
  Patient comes in with L wrist pain from fall playing tennis about an hour ago.  Patient states he has no other injuries and did not hit his head.  Patient does take eliquis  for afib.  Denies any pain at this time.  Some slight swelling on L wrist.

## 2024-01-13 ENCOUNTER — Other Ambulatory Visit (HOSPITAL_COMMUNITY): Payer: Self-pay

## 2024-01-13 DIAGNOSIS — S52532A Colles' fracture of left radius, initial encounter for closed fracture: Secondary | ICD-10-CM | POA: Diagnosis not present

## 2024-01-13 DIAGNOSIS — S52502A Unspecified fracture of the lower end of left radius, initial encounter for closed fracture: Secondary | ICD-10-CM | POA: Diagnosis not present

## 2024-01-19 DIAGNOSIS — S52502D Unspecified fracture of the lower end of left radius, subsequent encounter for closed fracture with routine healing: Secondary | ICD-10-CM | POA: Diagnosis not present

## 2024-01-19 DIAGNOSIS — M79645 Pain in left finger(s): Secondary | ICD-10-CM | POA: Diagnosis not present

## 2024-01-31 DIAGNOSIS — D49511 Neoplasm of unspecified behavior of right kidney: Secondary | ICD-10-CM | POA: Diagnosis not present

## 2024-01-31 DIAGNOSIS — N5201 Erectile dysfunction due to arterial insufficiency: Secondary | ICD-10-CM | POA: Diagnosis not present

## 2024-01-31 DIAGNOSIS — R3121 Asymptomatic microscopic hematuria: Secondary | ICD-10-CM | POA: Diagnosis not present

## 2024-02-08 ENCOUNTER — Other Ambulatory Visit: Payer: Self-pay

## 2024-02-08 ENCOUNTER — Emergency Department (HOSPITAL_COMMUNITY)

## 2024-02-08 ENCOUNTER — Other Ambulatory Visit: Payer: Self-pay | Admitting: Cardiology

## 2024-02-08 ENCOUNTER — Inpatient Hospital Stay (INDEPENDENT_AMBULATORY_CARE_PROVIDER_SITE_OTHER)

## 2024-02-08 ENCOUNTER — Inpatient Hospital Stay (HOSPITAL_COMMUNITY)
Admission: EM | Admit: 2024-02-08 | Discharge: 2024-02-08 | DRG: 312 | Disposition: A | Discharge status: Home / Self Care | Attending: Hospitalist | Admitting: Hospitalist

## 2024-02-08 ENCOUNTER — Encounter (HOSPITAL_COMMUNITY): Payer: Self-pay

## 2024-02-08 DIAGNOSIS — I517 Cardiomegaly: Secondary | ICD-10-CM | POA: Diagnosis not present

## 2024-02-08 DIAGNOSIS — Z79899 Other long term (current) drug therapy: Secondary | ICD-10-CM | POA: Diagnosis not present

## 2024-02-08 DIAGNOSIS — Z9842 Cataract extraction status, left eye: Secondary | ICD-10-CM

## 2024-02-08 DIAGNOSIS — I6782 Cerebral ischemia: Secondary | ICD-10-CM | POA: Diagnosis not present

## 2024-02-08 DIAGNOSIS — Z87891 Personal history of nicotine dependence: Secondary | ICD-10-CM | POA: Diagnosis not present

## 2024-02-08 DIAGNOSIS — I361 Nonrheumatic tricuspid (valve) insufficiency: Secondary | ICD-10-CM | POA: Diagnosis not present

## 2024-02-08 DIAGNOSIS — Z9049 Acquired absence of other specified parts of digestive tract: Secondary | ICD-10-CM | POA: Diagnosis not present

## 2024-02-08 DIAGNOSIS — Z9841 Cataract extraction status, right eye: Secondary | ICD-10-CM

## 2024-02-08 DIAGNOSIS — R42 Dizziness and giddiness: Secondary | ICD-10-CM | POA: Diagnosis not present

## 2024-02-08 DIAGNOSIS — I110 Hypertensive heart disease with heart failure: Secondary | ICD-10-CM | POA: Diagnosis not present

## 2024-02-08 DIAGNOSIS — Z7901 Long term (current) use of anticoagulants: Secondary | ICD-10-CM

## 2024-02-08 DIAGNOSIS — Z8249 Family history of ischemic heart disease and other diseases of the circulatory system: Secondary | ICD-10-CM | POA: Diagnosis not present

## 2024-02-08 DIAGNOSIS — I4891 Unspecified atrial fibrillation: Secondary | ICD-10-CM | POA: Diagnosis not present

## 2024-02-08 DIAGNOSIS — G459 Transient cerebral ischemic attack, unspecified: Secondary | ICD-10-CM | POA: Diagnosis not present

## 2024-02-08 DIAGNOSIS — R55 Syncope and collapse: Principal | ICD-10-CM | POA: Diagnosis present

## 2024-02-08 DIAGNOSIS — I5032 Chronic diastolic (congestive) heart failure: Secondary | ICD-10-CM | POA: Diagnosis not present

## 2024-02-08 DIAGNOSIS — I4811 Longstanding persistent atrial fibrillation: Secondary | ICD-10-CM | POA: Diagnosis not present

## 2024-02-08 DIAGNOSIS — R001 Bradycardia, unspecified: Principal | ICD-10-CM | POA: Diagnosis present

## 2024-02-08 DIAGNOSIS — Z7984 Long term (current) use of oral hypoglycemic drugs: Secondary | ICD-10-CM

## 2024-02-08 DIAGNOSIS — I672 Cerebral atherosclerosis: Secondary | ICD-10-CM | POA: Diagnosis not present

## 2024-02-08 DIAGNOSIS — D320 Benign neoplasm of cerebral meninges: Secondary | ICD-10-CM | POA: Diagnosis not present

## 2024-02-08 DIAGNOSIS — G319 Degenerative disease of nervous system, unspecified: Secondary | ICD-10-CM | POA: Diagnosis not present

## 2024-02-08 DIAGNOSIS — R9082 White matter disease, unspecified: Secondary | ICD-10-CM | POA: Diagnosis not present

## 2024-02-08 DIAGNOSIS — Z905 Acquired absence of kidney: Secondary | ICD-10-CM

## 2024-02-08 DIAGNOSIS — I6523 Occlusion and stenosis of bilateral carotid arteries: Secondary | ICD-10-CM | POA: Diagnosis not present

## 2024-02-08 DIAGNOSIS — R918 Other nonspecific abnormal finding of lung field: Secondary | ICD-10-CM | POA: Diagnosis not present

## 2024-02-08 DIAGNOSIS — M25532 Pain in left wrist: Secondary | ICD-10-CM | POA: Diagnosis not present

## 2024-02-08 LAB — COMPREHENSIVE METABOLIC PANEL WITH GFR
ALT: 19 U/L (ref 0–44)
AST: 36 U/L (ref 15–41)
Albumin: 3.9 g/dL (ref 3.5–5.0)
Alkaline Phosphatase: 61 U/L (ref 38–126)
Anion gap: 9 (ref 5–15)
BUN: 24 mg/dL — ABNORMAL HIGH (ref 8–23)
CO2: 24 mmol/L (ref 22–32)
Calcium: 9.4 mg/dL (ref 8.9–10.3)
Chloride: 103 mmol/L (ref 98–111)
Creatinine, Ser: 1.06 mg/dL (ref 0.61–1.24)
GFR, Estimated: >60 mL/min (ref >60)
Glucose, Bld: 113 mg/dL — ABNORMAL HIGH (ref 70–99)
Potassium: 3.9 mmol/L (ref 3.5–5.1)
Sodium: 136 mmol/L (ref 135–145)
Total Bilirubin: 1.4 mg/dL — ABNORMAL HIGH (ref 0.0–1.2)
Total Protein: 6.6 g/dL (ref 6.5–8.1)

## 2024-02-08 LAB — TSH
TSH: 1.777 u[IU]/mL (ref 0.350–4.500)
TSH: 1.679 u[IU]/mL (ref 0.350–4.500)

## 2024-02-08 LAB — CBC WITH DIFFERENTIAL/PLATELET
Abs Immature Granulocytes: 0.02 K/uL (ref 0.00–0.07)
Basophils Absolute: 0 K/uL (ref 0.0–0.1)
Basophils Relative: 1 %
Eosinophils Absolute: 0.1 K/uL (ref 0.0–0.5)
Eosinophils Relative: 1 %
HCT: 44.9 % (ref 39.0–52.0)
Hemoglobin: 15.1 g/dL (ref 13.0–17.0)
Immature Granulocytes: 1 %
Lymphocytes Relative: 21 %
Lymphs Abs: 0.9 K/uL (ref 0.7–4.0)
MCH: 31.3 pg (ref 26.0–34.0)
MCHC: 33.6 g/dL (ref 30.0–36.0)
MCV: 93 fL (ref 80.0–100.0)
Monocytes Absolute: 0.6 K/uL (ref 0.1–1.0)
Monocytes Relative: 13 %
Neutro Abs: 2.7 K/uL (ref 1.7–7.7)
Neutrophils Relative %: 63 %
Platelets: 135 K/uL — ABNORMAL LOW (ref 150–400)
RBC: 4.83 MIL/uL (ref 4.22–5.81)
RDW: 12.9 % (ref 11.5–15.5)
WBC: 4.2 K/uL (ref 4.0–10.5)
nRBC: 0 % (ref 0.0–0.2)

## 2024-02-08 LAB — MAGNESIUM: Magnesium: 2.1 mg/dL (ref 1.7–2.4)

## 2024-02-08 LAB — URINALYSIS, W/ REFLEX TO CULTURE (INFECTION SUSPECTED)
Bilirubin Urine: NEGATIVE
Glucose, UA: >=500 mg/dL — AB
Hgb urine dipstick: NEGATIVE
Ketones, ur: NEGATIVE mg/dL
Leukocytes,Ua: NEGATIVE
Nitrite: NEGATIVE
Protein, ur: NEGATIVE mg/dL
Specific Gravity, Urine: 1.006 (ref 1.005–1.030)
pH: 5 (ref 5.0–8.0)

## 2024-02-08 LAB — TYPE AND SCREEN
ABO/RH(D): O POS
Antibody Screen: NEGATIVE

## 2024-02-08 LAB — BRAIN NATRIURETIC PEPTIDE: B Natriuretic Peptide: 271.5 pg/mL — ABNORMAL HIGH (ref 0.0–100.0)

## 2024-02-08 MED ORDER — LOSARTAN POTASSIUM 50 MG PO TABS: 25.0000 mg | ORAL_TABLET | Freq: Every day | ORAL | Status: DC

## 2024-02-08 MED ORDER — APIXABAN 5 MG PO TABS
5.0000 mg | ORAL_TABLET | Freq: Two times a day (BID) | ORAL | Status: DC
Start: 2024-02-08 — End: 2024-02-08

## 2024-02-08 MED ORDER — IOHEXOL 350 MG/ML SOLN
50.0000 mL | Freq: Once | INTRAVENOUS | Status: AC | PRN
Start: 2024-02-08 — End: 2024-02-08
  Administered 2024-02-08: 50 mL via INTRAVENOUS

## 2024-02-08 MED ORDER — PANTOPRAZOLE SODIUM 40 MG PO TBEC
40.0000 mg | DELAYED_RELEASE_TABLET | Freq: Every day | ORAL | Status: DC
Start: 2024-02-09 — End: 2024-02-08

## 2024-02-08 MED ORDER — FUROSEMIDE 20 MG PO TABS: 20.0000 mg | ORAL_TABLET | Freq: Every day | ORAL | Status: DC

## 2024-02-08 MED ORDER — MELATONIN 5 MG PO TABS: 5.0000 mg | ORAL_TABLET | Freq: Every day | ORAL | Status: DC

## 2024-02-08 MED ORDER — ENOXAPARIN SODIUM 40 MG/0.4ML IJ SOSY: 40.0000 mg | PREFILLED_SYRINGE | INTRAMUSCULAR | Status: DC

## 2024-02-08 MED ORDER — METOPROLOL SUCCINATE ER 25 MG PO TB24
50.0000 mg | ORAL_TABLET | Freq: Two times a day (BID) | ORAL | Status: DC
Start: 2024-02-08 — End: 2024-02-08

## 2024-02-08 MED ORDER — HYDROCODONE-ACETAMINOPHEN 5-325 MG PO TABS: 0.5000 | ORAL_TABLET | Freq: Four times a day (QID) | ORAL | Status: DC | PRN

## 2024-02-08 MED ORDER — ALPRAZOLAM 0.25 MG PO TABS: 0.2500 mg | ORAL_TABLET | Freq: Four times a day (QID) | ORAL | Status: DC | PRN

## 2024-02-08 MED ORDER — SPIRONOLACTONE 25 MG PO TABS: 25.0000 mg | ORAL_TABLET | Freq: Every day | ORAL | Status: DC

## 2024-02-08 MED ORDER — EMPAGLIFLOZIN 10 MG PO TABS: 10.0000 mg | ORAL_TABLET | Freq: Every day | ORAL | Status: DC

## 2024-02-08 MED ORDER — FERROUS GLUCONATE 324 (38 FE) MG PO TABS: 324.0000 mg | ORAL_TABLET | ORAL | Status: DC

## 2024-02-08 NOTE — Discharge Summary (Signed)
 Physician Discharge Summary   Patient: John Holder MRN: [id number] DOB: 09/12/1931  Admit date:     02/08/2024  Discharge date: 02/08/24  Discharge Physician: Marsha Ada   PCP: Shepard Ade, MD   Recommendations at discharge:    Please take all medications as previously prescribed, and follow-up with your cardiologist to have Holter monitor, which will be mailed to your home, interrogated.  Discharge Diagnoses: Principal Problem:   Syncope Active Problems:   Lightheadedness   Longstanding persistent atrial fibrillation (HCC)  Resolved Problems:   * No resolved hospital problems. [facility] Course: See below.  Assessment and Plan: John Holder is a 89 y.o. male with medical history significant of PAF, HFpEF, severe symptomatic nonrheumatic tricuspid insufficiency s/p tTEER with 2 clip devices (XTW, XTW) by Dr. Consepcion 02/10/2022, pulmonary HTN, HTN, IDA, and L renal mass s/p L nephrectomy p/w pre-syncope and found to have c/f symptomatic bradycardia.   In the ED, pt bradycardic to 30s very briefly. Labs notable for BNP 271.5. MRI showed  punctate acute infarct versus artifact within the right aspect of the pons, and Neurology confirmed this was artifact (no infarct). EDP consulted cards who evaluated pt and determined very little bradycardia was actually present; thus, pt was deemed stable for discharge with plan to have 2 week Holter monitor placed and f/u w/ OP cardiologist. Pt and wife in agreement.       Consultants: Cards and Neurology Procedures performed: N/A  Disposition: Home Diet recommendation:  Discharge Diet Orders (From admission, onward)     Start     Ordered   02/08/24 0000  Diet - low sodium heart healthy        02/08/24 1647           Cardiac diet DISCHARGE MEDICATION: Allergies as of 02/08/2024   No Known Allergies      Medication List     TAKE these medications    ADVANCED JOINT RELIEF PO Take 1 tablet by mouth 2 (two) times  daily.   ALPRAZolam  0.25 MG tablet Commonly known as: XANAX  Take 1 tablet (0.25 mg total) by mouth every 6 (six) hours as needed for anxiety   BERBERINE COMPLEX PO Take 2,400 mg by mouth daily.   Cholecalciferol 50 MCG (2000 UT) Caps take one capsule Orally daily   Chromium 1000 MCG Tabs Take 1,000 mcg by mouth daily. chromium picolinate   Co Q10 200 MG Caps Take 200 mg by mouth daily.   Eliquis  5 MG Tabs tablet Generic drug: apixaban  Take 1 tablet (5 mg total) by mouth 2 (two) times daily.   ferrous gluconate  324 MG tablet Commonly known as: FERGON Take 324 mg by mouth 3 (three) times a week.   furosemide  20 MG tablet Commonly known as: LASIX  Take 1 tablet (20 mg total) by mouth daily.   Hyaluronic Acid 100 MG Caps Take 100 mg by mouth 2 (two) times daily.   HYDROcodone -acetaminophen  5-325 MG tablet Commonly known as: NORCO/VICODIN Take 0.5 tablets by mouth every 6 (six) hours as needed for severe pain (pain score 7-10).   Jardiance  10 MG Tabs tablet Generic drug: empagliflozin  Take 1 tablet (10 mg total) by mouth daily before breakfast.   losartan  25 MG tablet Commonly known as: COZAAR  Take 1 tablet (25 mg total) by mouth daily.   MAGNESIUM  GLYCINATE PO Take 525 mg by mouth in the morning and at bedtime.   Melatonin 10 MG Tabs Take 10 mg by mouth at bedtime.  metoprolol  succinate 50 MG 24 hr tablet Commonly known as: TOPROL -XL Take 1 tablet (50 mg total) by mouth 2 (two) times daily. Take with or immediately following a meal   multivitamin capsule Take 1 capsule by mouth daily. Mega men 50 plus   NON FORMULARY Take 300 mg by mouth 2 (two) times daily. TRU vitamin  Nigen   omeprazole  20 MG capsule Commonly known as: PRILOSEC Take 1 capsule (20 mg total) by mouth daily.   OVER THE COUNTER MEDICATION Take 2 capsules by mouth daily. Prostacare   OVER THE COUNTER MEDICATION Take 1 capsule by mouth 2 (two) times daily. Uricare Himalaya   OVER THE  COUNTER MEDICATION Take 1 tablet by mouth 2 (two) times daily. virectin   OVER THE COUNTER MEDICATION Take 1 tablet by mouth 2 (two) times daily. Liver care   OVER THE COUNTER MEDICATION Take 1 tablet by mouth 2 (two) times daily. Tri Flex   OVER THE COUNTER MEDICATION Take 1 capsule by mouth 2 (two) times daily. conjugated linoleic acid   OVER THE COUNTER MEDICATION Take 1 tablet by mouth daily in the afternoon. CDB Gummies   OVER THE COUNTER MEDICATION Take 1 capsule by mouth daily in the afternoon. Hemp oil gummies   OVER THE COUNTER MEDICATION Take 1 tablet by mouth daily. arthrozene   PREVAGEN EXTRA STRENGTH PO Take 1 tablet by mouth daily.   spironolactone  25 MG tablet Commonly known as: ALDACTONE  Take 1 tablet (25 mg total) by mouth daily.   Systane 0.4-0.3 % Soln Generic drug: Polyethyl Glycol-Propyl Glycol 1 drop daily as needed (as needed for dry eye).   tadalafil 20 MG tablet Commonly known as: CIALIS TAKE ONE TABLET BY MOUTH DAILY AS NEEDED   ZINC PO Take by mouth daily.         Discharge Exam: Filed Weights   02/08/24 1258  Weight: 69.9 kg   General: Alert, oriented x3, resting comfortably in no acute distress Respiratory: Lungs clear to auscultation bilaterally with normal respiratory effort; no w/r/r Cardiovascular: Regular rate and rhythm w/o m/r/g   Condition at discharge: good  The results of significant diagnostics from this hospitalization (including imaging, microbiology, ancillary and laboratory) are listed below for reference.   Imaging Studies: DG Chest Portable 1 View Result Date: 02/08/2024 CLINICAL DATA:  Near syncope with lightheadedness. Dizziness and unsteadiness. EXAM: PORTABLE CHEST 1 VIEW COMPARISON:  03/17/2021 FINDINGS: Cardiac enlargement. Linear atelectasis or infiltration in the left base. No airspace disease or consolidation otherwise seen in the lungs. No pleural effusion or pneumothorax. Mediastinal contours appear  intact. Calcified and tortuous aorta. Degenerative changes in the spine and shoulders. IMPRESSION: Cardiac enlargement. Linear atelectasis or infiltration in the left base. Right lung is clear. Electronically Signed   By: Elsie Gravely M.D.   On: 02/08/2024 15:47   CT ANGIO HEAD NECK W WO CM Result Date: 02/08/2024 EXAM: CTA HEAD AND NECK WITH AND WITHOUT 02/08/2024 03:22:00 PM TECHNIQUE: CTA of the head and neck was performed with and without the administration of 50 mL iohexol  (OMNIPAQUE ) 350 MG/ML injection. Multiplanar 2D and/or 3D reformatted images are provided for review. Automated exposure control, iterative reconstruction, and/or weight based adjustment of the mA/kV was utilized to reduce the radiation dose to as low as reasonably achievable. Stenosis of the internal carotid arteries measured using NASCET criteria. COMPARISON: MR head without contrast 02/08/2024. CLINICAL HISTORY: Vertigo, central; transient room spinning dizziness with no hx of vertigo. unsteadiness. now resolved. mild carodit bruit on exam.  Unsteady gait. Transient room spinning dizziness with no history of vertigo. Symptoms resolved at present. FINDINGS: CTA NECK: AORTIC ARCH AND ARCH VESSELS: Atherosclerotic calcifications are present in the distal aortic arch. Calcifications are present in the proximal left subclavian artery without focal stenosis. No aneurysm or dissection is present. No dissection or arterial injury. No significant stenosis of the brachiocephalic or subclavian arteries. CERVICAL CAROTID ARTERIES: Atherosclerotic changes are present at the right carotid bifurcation without significant stenosis. No dissection, arterial injury, or hemodynamically significant stenosis by NASCET criteria. CERVICAL VERTEBRAL ARTERIES: The right vertebral artery is the dominant vessel. No dissection, arterial injury, or significant stenosis. LUNGS AND MEDIASTINUM: Unremarkable. SOFT TISSUES: No acute abnormality. BONES: Multilevel  degenerative changes are present in the cervical spine. Slight degenerative anterolisthesis is present at C2-C3. No acute abnormality. CTA HEAD: ANTERIOR CIRCULATION: Minimal atherosclerotic calcifications are present at the right cavernous ICA without focal stenosis. No significant stenosis of the internal carotid arteries. The anterior communicating artery is patent. No significant stenosis of the anterior cerebral arteries. No significant stenosis of the middle cerebral arteries. No aneurysm. POSTERIOR CIRCULATION: The left posterior cerebral artery is of fetal type. No significant stenosis of the posterior cerebral arteries. No significant stenosis of the basilar artery. No significant stenosis of the vertebral arteries. No aneurysm. OTHER: Moderate atrophy and white matter disease is stable. The potential brainstem infarct is below the resolution of CT. Bilateral lens replacements are noted. The globes and orbits are otherwise within normal limits. No dural venous sinus thrombosis on this non-dedicated study. IMPRESSION: 1. No large vessel occlusion, hemodynamically significant stenosis, or aneurysm in the head or neck. 2. Stable moderate cerebral atrophy and white matter disease. 3. Incidental atherosclerotic and degenerative changes without significant stenosis, see report body for details. Electronically signed by: Lonni Necessary MD 02/08/2024 03:42 PM EDT RP Workstation: [id number]   MR BRAIN WO CONTRAST Result Date: 02/08/2024 CLINICAL DATA:  Provided history: Transient ischemic attack (TIA). EXAM: MRI HEAD WITHOUT CONTRAST TECHNIQUE: Multiplanar, multiecho pulse sequences of the brain and surrounding structures were obtained without intravenous contrast. COMPARISON:  Head CT 04/28/2023.  Brain MRI 03/18/2023. FINDINGS: Brain: Mild-to-moderate generalized cerebral atrophy. Apparent punctate focus of diffusion-weighted signal abnormality within the right aspect of the pons, which could reflect a  tiny acute infarct or artifact (series 13, image 18). Redemonstrated small chronic lacunar infarct within the left frontal lobe periventricular white matter (series 12, image 21). Multifocal T2 FLAIR hyperintense signal abnormality elsewhere within the cerebral white matter, nonspecific but compatible with mild chronic small vessel ischemic disease. 4 mm meningioma overlying the anterior right frontal lobe, unchanged in size from the brain MRI of 03/18/2023. Possible additional small meningioma within the posterior fossa on the left (along the petrous left temporal bone), better appreciated on the prior contrast-enhanced MRI (series 12, image 8 on today's study). No chronic intracranial blood products. No extra-axial fluid collection. No midline shift. Vascular: Maintained flow voids within the proximal large arterial vessels. Skull and upper cervical spine: No focal worrisome marrow lesion. Incompletely assessed cervical spondylosis. Sinuses/Orbits: No mass or acute finding within the imaged orbits. Prior bilateral ocular lens replacement. No significant paranasal sinus disease. IMPRESSION: 1. Punctate acute infarct versus artifact within the right aspect of the pons. 2. 4 mm meningioma overlying the anterior right frontal lobe, unchanged. 3. Possible additional small meningioma within the posterior fossa on the left, as described on the prior MRI. 4. Background parenchymal atrophy and chronic small vessel ischemic disease. Electronically Signed   By:  Rockey Childs D.O.   On: 02/08/2024 14:37   DG Wrist Complete Left Result Date: 01/12/2024 CLINICAL DATA:  Diffuse left wrist pain after a fall. EXAM: LEFT WRIST - COMPLETE 3+ VIEW COMPARISON:  None Available. FINDINGS: Comminuted fractures of the distal left radial metaphysis. Fracture lines extend to the radiocarpal and radioulnar joints. Dorsal angulation and impaction of the distal fracture fragments. The nondisplaced ulnar styloid process fracture. Diffuse soft  tissue swelling over the left wrist. Underlying degenerative changes involving the radiocarpal, STT, and first carpometacarpal joints. Calcification in the triangular fibrocartilage. IMPRESSION: 1. Comminuted fractures of the distal left radius with dorsal angulation of the distal fracture fragments. Nondisplaced ulnar styloid process fracture. 2. Diffuse degenerative change throughout the carpus. Electronically Signed   By: Elsie Gravely M.D.   On: 01/12/2024 19:51    Microbiology: Results for orders placed or performed during the hospital encounter of 03/17/21  SARS Coronavirus 2 by RT PCR (hospital order, performed in [facility] hospital lab) Nasopharyngeal Nasopharyngeal Swab     Status: None   Collection Time: 03/17/21 11:59 AM   Specimen: Nasopharyngeal Swab  Result Value Ref Range Status   SARS Coronavirus 2 NEGATIVE NEGATIVE Final    Comment: (NOTE) SARS-CoV-2 target nucleic acids are NOT DETECTED.  The SARS-CoV-2 RNA is generally detectable in upper and lower respiratory specimens during the acute phase of infection. The lowest concentration of SARS-CoV-2 viral copies this assay can detect is 250 copies / mL. A negative result does not preclude SARS-CoV-2 infection and should not be used as the sole basis for treatment or other patient management decisions.  A negative result may occur with improper specimen collection / handling, submission of specimen other than nasopharyngeal swab, presence of viral mutation(s) within the areas targeted by this assay, and inadequate number of viral copies (<250 copies / mL). A negative result must be combined with clinical observations, patient history, and epidemiological information.  Fact Sheet for Patients:   BoilerBrush.com.cy  Fact Sheet for Healthcare Providers: https://pope.com/  This test is not yet approved or  cleared by the [place]  FDA and has been authorized for  detection and/or diagnosis of SARS-CoV-2 by FDA under an Emergency Use Authorization (EUA).  This EUA will remain in effect (meaning this test can be used) for the duration of the COVID-19 declaration under Section 564(b)(1) of the Act, 21 U.S.C. section 360bbb-3(b)(1), unless the authorization is terminated or revoked sooner.  Performed at [facility], [address] [place], [place] [address]     Labs: CBC: Recent Labs  Lab 02/08/24 1302  WBC 4.2  NEUTROABS 2.7  HGB 15.1  HCT 44.9  MCV 93.0  PLT 135*   Basic Metabolic Panel: Recent Labs  Lab 02/08/24 1302 02/08/24 1308  NA 136  --   K 3.9  --   CL 103  --   CO2 24  --   GLUCOSE 113*  --   BUN 24*  --   CREATININE 1.06  --   CALCIUM 9.4  --   MG  --  2.1   Liver Function Tests: Recent Labs  Lab 02/08/24 1302  AST 36  ALT 19  ALKPHOS 61  BILITOT 1.4*  PROT 6.6  ALBUMIN  3.9   CBG: No results for input(s): GLUCAP in the last 168 hours.  Discharge time spent: greater than 30 minutes.  Signed: Marsha Ada, MD [facility] 02/08/2024

## 2024-02-08 NOTE — Progress Notes (Unsigned)
 Enrolled patient for a 14 day Zio XT  monitor to be mailed to patients home

## 2024-02-08 NOTE — Consult Note (Addendum)
 Cardiology Consultation  Patient ID: John Holder MRN: [id number]; DOB: Jul 08, 1931  Admit date: 02/08/2024 Date of Consult: 02/08/2024  PCP:  Shepard Ade, MD   [facility] Providers Cardiologist:  None     Patient Profile: John Holder is a 89 y.o. male with a hx of chronic atrial fibrillation, pulmonary hypertension, RBBB, chronic HFpEF, valvular heart disease s/p mitraclip x2, iron  deficiency anemia who is being seen 02/08/2024 for the evaluation of transient bradycardia at the request of Dr. Ginger.  History of Present Illness: John Holder has past medical history as above. He presented to [facility] ED via EMS on 02/08/2024 for presyncope. He reported ~10 minutes of lightheadedness, issues with balance and feeling as if the room was spinning. He reports this came on suddenly while he was having a arm cast removed. He denied any speech or vision changes, no numbness/tingling or any weakness. After sitting down for some time he quickly returned to baseline.   Relevant workup since in the ED includes: CBC and CMP unremarkable, TSH normal, BNP mildly elevated but stable, Mag 2.1, brain MRI showed possible acute infarct, 4mm meningioma overlying anterior right frontal lobe, stable. Pending CXR, CTA head/neck.  After speaking with the patient, he agrees with history as stated above.  He says that he was coming home from his Ortho appointment.  Towards the end of August he fractured his wrist, playing tennis, and was having his cast removed and a brace placed on.  He does note that he had significant pain during this visit.  However it was upon returning home where he began feeling lightheaded and dizzy.  His home medications include: Jardiance  10 mg daily, Lasix  20 mg daily, losartan  25 mg daily, Toprol  50 mg twice daily, spironolactone  25 mg daily, Eliquis  5 mg twice daily.  He reports no missed or additional doses of any of these medications.  He denies any recent sick symptoms or  known sick contacts.  He denies any changes in his PO intake.  He tells me that typically his heart rate tends to be 60s to 80s.  He has previously followed with [facility] for cardiology.  He had a recent echocardiogram done 02/17/2023 that showed: LVEF 70 to 75%, severe biatrial enlargement, mildly dilated and mildly reduced RV systolic function, moderate MR with MitraClip present, moderate to severe TR with 2 XTW clips present.  He last saw Dr. Rolan 12/08/2023 as an outpatient for evaluation of CHF, pulmonary hypertension.  At this appointment he was doing well, no symptoms.  His current medications as of this appointment included: Lasix  20 mg daily, Jardiance  10 mg daily, spironolactone  25 mg daily, Eliquis  5 mg twice daily, Toprol  50 mg twice daily.  He was noted to have a heart rate of 71, BP 110/72 at this appointment.  Past Medical History:  Diagnosis Date   Blood transfusion without reported diagnosis    Cataract    removed in replace bilateral   CHF (congestive heart failure) (HCC)    Dysrhythmia    GERD (gastroesophageal reflux disease)    Hypertension    Iron  deficiency anemia    amdx 08/2018 >> Tx with PRBCs and Feraheme  started; no GI source per workup; followed by Heme (Dr. Layla) and GI (Dr. Donnald)    Left renal mass    s/p L nephrectomy   Mitral regurgitation    Echo 02/2016:  Mild conc LVH, EF 55-60, mild to mod MR, mod LAE, mod to severe RAE, mod TR,  PASP 41 // Echo 10/2018: EF 60-65, mod conc LVH, severe BAE, trivial eff, mild MVP with mod MR, mod to severe TR, asc aorta 36 (mild dilation)    Persistent Atrial Fibrillation    Pulmonary hypertension (HCC)    Echocardiogram 7/22: EF 60-65, no RWMA, normal RVSF, RVSP 70.7 (severe pulmonary hypertension), severe BAE, mod MR, severe TR, mild to mod AV sclerosis w/o AS   S/P mitral valve clip implantation 02/26/2021   G4 NT W device; A3/P3 with Dr. Wonda   Past Surgical History:  Procedure Laterality Date   CARDIAC  CATHETERIZATION     CARDIOVERSION N/A 02/04/2016   Procedure: CARDIOVERSION;  Surgeon: Aleene JINNY Passe, MD;  Location: [facility] ENDOSCOPY;  Service: Cardiovascular;  Laterality: N/A;   CHOLECYSTECTOMY     COLONOSCOPY     COLONOSCOPY WITH PROPOFOL  N/A 09/10/2018   Procedure: COLONOSCOPY WITH PROPOFOL ;  Surgeon: Donnald Charleston, MD;  Location: [facility] ENDOSCOPY;  Service: Endoscopy;  Laterality: N/A;   ESOPHAGOGASTRODUODENOSCOPY (EGD) WITH PROPOFOL  N/A 09/10/2018   Procedure: ESOPHAGOGASTRODUODENOSCOPY (EGD) WITH PROPOFOL ;  Surgeon: Donnald Charleston, MD;  Location: [facility] ENDOSCOPY;  Service: Endoscopy;  Laterality: N/A;   EYE SURGERY     GIVENS CAPSULE STUDY N/A 09/10/2018   Procedure: GIVENS CAPSULE STUDY;  Surgeon: Donnald Charleston, MD;  Location: [facility] ENDOSCOPY;  Service: Endoscopy;  Laterality: N/A;   NEPHRECTOMY Left 2002   RIGHT/LEFT HEART CATH AND CORONARY ANGIOGRAPHY N/A 01/27/2021   Procedure: RIGHT/LEFT HEART CATH AND CORONARY ANGIOGRAPHY;  Surgeon: Rolan Ezra RAMAN, MD;  Location: [facility] INVASIVE CV LAB;  Service: Cardiovascular;  Laterality: N/A;   TEE WITHOUT CARDIOVERSION N/A 01/27/2021   Procedure: TRANSESOPHAGEAL ECHOCARDIOGRAM (TEE);  Surgeon: Rolan Ezra RAMAN, MD;  Location: [facility] ENDOSCOPY;  Service: Cardiovascular;  Laterality: N/A;   TEE WITHOUT CARDIOVERSION N/A 02/26/2021   Procedure: TRANSESOPHAGEAL ECHOCARDIOGRAM (TEE);  Surgeon: Wonda Sharper, MD;  Location: [facility] INVASIVE CV LAB;  Service: Cardiovascular;  Laterality: N/A;   TEE WITHOUT CARDIOVERSION N/A 02/27/2021   Procedure: TRANSESOPHAGEAL ECHOCARDIOGRAM (TEE);  Surgeon: Rolan Ezra RAMAN, MD;  Location: [facility] ENDOSCOPY;  Service: Cardiovascular;  Laterality: N/A;   TEE WITHOUT CARDIOVERSION N/A 03/19/2021   Procedure: TRANSESOPHAGEAL ECHOCARDIOGRAM (TEE);  Surgeon: Wonda Sharper, MD;  Location: [facility] INVASIVE CV LAB;  Service: Cardiovascular;  Laterality: N/A;   TONSILLECTOMY     TRANSCATHETER MITRAL EDGE TO EDGE REPAIR N/A 02/26/2021    Procedure: MITRAL VALVE REPAIR;  Surgeon: Wonda Sharper, MD;  Location: [facility] INVASIVE CV LAB;  Service: Cardiovascular;  Laterality: N/A;   TRANSCATHETER MITRAL EDGE TO EDGE REPAIR N/A 03/19/2021   Procedure: MITRAL VALVE REPAIR;  Surgeon: Wonda Sharper, MD;  Location: [facility] INVASIVE CV LAB;  Service: Cardiovascular;  Laterality: N/A;    Home Medications:  Prior to Admission medications   Medication Sig Start Date End Date Taking? Authorizing Provider  ALPRAZolam  (XANAX ) 0.25 MG tablet Take 1 tablet (0.25 mg total) by mouth every 6 (six) hours as needed for anxiety 09/26/23     apixaban  (ELIQUIS ) 5 MG TABS tablet Take 1 tablet (5 mg total) by mouth 2 (two) times daily. 03/30/23   Rolan Ezra RAMAN, MD  [facility] EXTRA STRENGTH PO) Take 1 tablet by mouth daily. 10/16/18   [provider]  Barberry-Oreg Grape-Goldenseal (BERBERINE COMPLEX PO) Take 2,400 mg by mouth daily.    [provider]  Cholecalciferol 50 MCG (2000 UT) CAPS take one capsule Orally daily    [provider]  Chromium 1000 MCG TABS Take 1,000 mcg by mouth daily.  chromium picolinate    [provider]  Coenzyme Q10 (CO Q10) 200 MG CAPS Take 200 mg by mouth daily. 03/06/04   [provider]  empagliflozin  (JARDIANCE ) 10 MG TABS tablet Take 1 tablet (10 mg total) by mouth daily before breakfast. 03/09/23   Rolan Ezra RAMAN, MD  ferrous gluconate  [facility]) 324 MG tablet Take 324 mg by mouth 3 (three) times a week.    [provider]  furosemide  (LASIX ) 20 MG tablet Take 1 tablet (20 mg total) by mouth daily. 03/09/23   Rolan Ezra RAMAN, MD  HYDROcodone -acetaminophen  (NORCO/VICODIN) 5-325 MG tablet Take 0.5 tablets by mouth every 6 (six) hours as needed for severe pain (pain score 7-10). 01/12/24   Daralene Lonni BIRCH, PA-C  losartan  (COZAAR ) 25 MG tablet Take 1 tablet (25 mg total) by mouth daily. 11/07/23   Rolan Ezra RAMAN, MD  MAGNESIUM  GLYCINATE PO Take 525 mg by mouth in  the morning and at bedtime. 06/16/18   [provider]  Melatonin 10 MG TABS Take 10 mg by mouth at bedtime. 03/06/04   [provider]  metoprolol  succinate (TOPROL -XL) 50 MG 24 hr tablet Take 1 tablet (50 mg total) by mouth 2 (two) times daily. Take with or immediately following a meal 11/25/23   Rolan Ezra RAMAN, MD  Misc Natural Products (ADVANCED JOINT RELIEF PO) Take 1 tablet by mouth 2 (two) times daily.    [provider]  Multiple Vitamin (MULTIVITAMIN) capsule Take 1 capsule by mouth daily. Mega men 50 plus    [provider]  Multiple Vitamins-Minerals (ZINC PO) Take by mouth daily.    [provider]  NON FORMULARY Take 300 mg by mouth 2 (two) times daily. TRU vitamin  Nigen    [provider]  omeprazole  (PRILOSEC) 20 MG capsule Take 1 capsule (20 mg total) by mouth daily. 05/04/23     OVER THE COUNTER MEDICATION Take 2 capsules by mouth daily. [facility]    [provider]  OVER THE COUNTER MEDICATION Take 1 capsule by mouth 2 (two) times daily. [employer], Historical, MD  OVER THE COUNTER MEDICATION Take 1 tablet by mouth 2 (two) times daily. virectin    [provider]  OVER THE COUNTER MEDICATION Take 1 tablet by mouth 2 (two) times daily. Liver care    [provider]  OVER THE COUNTER MEDICATION Take 1 tablet by mouth 2 (two) times daily. Tri Flex    [provider]  OVER THE COUNTER MEDICATION Take 1 capsule by mouth 2 (two) times daily. conjugated linoleic acid    [provider]  OVER THE COUNTER MEDICATION Take 1 tablet by mouth daily in the afternoon. CDB Gummies    [provider]  OVER THE COUNTER MEDICATION Take 1 capsule by mouth daily in the afternoon. Hemp oil gummies    [provider]  OVER THE COUNTER MEDICATION Take 1 tablet by mouth daily. arthrozene    [provider]  Polyethyl Glycol-Propyl Glycol (SYSTANE) 0.4-0.3 % SOLN  1 drop daily as needed (as needed for dry eye).    [provider]  Sodium Hyaluronate, oral, (HYALURONIC ACID) 100 MG CAPS Take 100 mg by mouth 2 (two) times daily.    [provider]  spironolactone  (ALDACTONE ) 25 MG tablet Take 1 tablet (25 mg total) by mouth daily. 03/09/23   Rolan Ezra RAMAN, MD  tadalafil (CIALIS) 20 MG tablet TAKE ONE TABLET BY MOUTH DAILY AS  NEEDED 03/10/20   Watt Rush, MD    Scheduled Meds:  apixaban   5 mg Oral BID   [START ON 02/09/2024] empagliflozin   10 mg Oral QAC breakfast   ferrous gluconate   324 mg Oral Once per day on Monday Wednesday Friday   [START ON 02/09/2024] furosemide   20 mg Oral Daily   [START ON 02/09/2024] losartan   25 mg Oral Daily   melatonin  5 mg Oral QHS   metoprolol  succinate  50 mg Oral BID   [START ON 02/09/2024] pantoprazole   40 mg Oral Daily   [START ON 02/09/2024] spironolactone   25 mg Oral Daily   Continuous Infusions:  PRN Meds: ALPRAZolam , HYDROcodone -acetaminophen   Allergies:   No Known Allergies  Social History:   Social History   Socioeconomic History   Marital status: Widowed    Spouse name: Not on file   Number of children: 3   Years of education: Not on file   Highest education level: Professional school degree (e.g., MD, DDS, DVM, JD)  Occupational History   Occupation: judge, lawyer, professor    Comment: retired from all 3  Tobacco Use   Smoking status: Former    Types: Pipe    Quit date: 05/17/1978    Years since quitting: 45.7   Smokeless tobacco: Never   Tobacco comments:    Smoked cigars for approx 40 years.   Vaping Use   Vaping status: Never Used  Substance and Sexual Activity   Alcohol  use: Yes    Alcohol /week: 0.0 standard drinks of alcohol     Comment: occasional   Drug use: No   Sexual activity: Not on file  Other Topics Concern   Not on file  Social History Narrative   Not on file   Social Drivers of Health   Financial Resource Strain: Not on file  Food Insecurity:  Not on file  Transportation Needs: Not on file  Physical Activity: Not on file  Stress: Not on file  Social Connections: Not on file  Intimate Partner Violence: Not on file    Family History:   Family History  Problem Relation Age of Onset   Breast cancer Mother 5   Hypertension Father    Diabetes Neg Hx    Stroke Neg Hx    CAD Neg Hx    Colon cancer Neg Hx    Colon polyps Neg Hx    Esophageal cancer Neg Hx    Rectal cancer Neg Hx    Stomach cancer Neg Hx     ROS:  Please see the history of present illness.  All other ROS reviewed and negative.     Physical Exam/Data: Vitals:   02/08/24 1620 02/08/24 1621 02/08/24 1622 02/08/24 1625  BP:      Pulse: 65 (!) 42 68 (!) 34  Resp: 20 19 (!) 23 19  Temp:      TempSrc:      SpO2: 100% 100% 100% 100%  Weight:      Height:       No intake or output data in the 24 hours ending 02/08/24 1649    02/08/2024   12:58 PM 01/12/2024    7:34 PM 12/08/2023   10:05 AM  Last 3 Weights  Weight (lbs) 154 lb 163 lb 163 lb 6.4 oz  Weight (kg) 69.854 kg 73.936 kg 74.118 kg     Body mass index is 21.48 kg/m.   General:  Well nourished, well developed, in no acute distress HEENT: normal Neck: no JVD  Vascular: No carotid bruits; Distal pulses 2+ bilaterally Cardiac:  normal S1, S2; iRRR Lungs:  clear to auscultation bilaterally, no wheezing, rhonchi or rales  Abd: soft, nontender, no hepatomegaly  Ext: no edema Musculoskeletal:  No deformities Skin: warm and dry  Neuro: no focal abnormalities noted Psych:  Normal affect   EKG:  The EKG was personally reviewed and demonstrates: Atrial fibrillation, RBBB, HR 60s  Telemetry:  Telemetry was personally reviewed and demonstrates: Atrial fibrillation, rates primarily 50-70, RBBB  Relevant CV Studies:  Echocardiogram, 02/17/2023, from [facility] LEFT VENTRICLE   The left ventricular cavity size is normal. Wall thickness  is normal. The ejection fraction is 70%  +--5% , estimated visually.  No  segmental wall motion abnormalities. Inconclusive diastolic evaluation due  to mitral valve repair.  RIGHT VENTRICLE   The right ventricular cavity size is mildly dilated.  Systolic function is mildly reduced by visual estimate.  LEFT ATRIUM   The left [facility] is severely dilated as determined by left  atrial volume index.  RIGHT ATRIUM   The right [facility] is severely dilated.  ATRIAL SEPTUM   The interatrial septum appears normal with no evidence of  ASD-PFO by color Doppler.  MITRAL VALVE   There is a mitral clip present with leaflet insertion that  was suboptimally characterized. There is moderate mitral regurgitation by  visual estimate from limited views.  AORTIC VALVE   Aortic valve appears tricuspid with mildly calcified  leaflets.  Doppler   There is no aortic stenosis. There is no evidence of  aortic regurgitation.  TRICUSPID VALVE   The tricuspid valve was evaluated using 3D imaging. There  are 2 XTW clips to the tricuspid valvewith leaflet insertion suboptimally  characterized . The mean diatolic gradient is 1 mmHg. HR ~ 79 bpm.  Doppler   There is moderate-severe tricuspid regurgitation based on visual  estimate on limited views. Pulmonary artery estimated peak systolic  pressure  47mm Hg. This is consistent with moderate pulmonary hypertension.  PULMONIC VALVE    The pulmonic valve is not well visualized.  Doppler  There is no evidence of pulmonic regurgitation.  PERICARDIUM   There is no pericardial effusion.  SYSTEMIC VEINS   Inferior vena cava is dilated  greater than 2.1cm  with  greater than 50% collapse with inspiration.    Laboratory Data: High Sensitivity Troponin:  No results for input(s): TROPONINIHS in the last 720 hours.   Chemistry Recent Labs  Lab 02/08/24 1302 02/08/24 1308  NA 136  --   K 3.9  --   CL 103  --   CO2 24  --   GLUCOSE 113*  --   BUN 24*  --   CREATININE 1.06  --   CALCIUM 9.4  --   MG  --  2.1  GFRNONAA >60  --   ANIONGAP 9   --     Recent Labs  Lab 02/08/24 1302  PROT 6.6  ALBUMIN  3.9  AST 36  ALT 19  ALKPHOS 61  BILITOT 1.4*   Lipids No results for input(s): CHOL, TRIG, HDL, LABVLDL, LDLCALC, CHOLHDL in the last 168 hours.  Hematology Recent Labs  Lab 02/08/24 1302  WBC 4.2  RBC 4.83  HGB 15.1  HCT 44.9  MCV 93.0  MCH 31.3  MCHC 33.6  RDW 12.9  PLT 135*   Thyroid   Recent Labs  Lab 02/08/24 1302  TSH 1.777    BNP Recent Labs  Lab 02/08/24 1308  BNP 271.5*  DDimer No results for input(s): DDIMER in the last 168 hours.  Radiology/Studies:  DG Chest Portable 1 View Result Date: 02/08/2024 CLINICAL DATA:  Near syncope with lightheadedness. Dizziness and unsteadiness. EXAM: PORTABLE CHEST 1 VIEW COMPARISON:  03/17/2021 FINDINGS: Cardiac enlargement. Linear atelectasis or infiltration in the left base. No airspace disease or consolidation otherwise seen in the lungs. No pleural effusion or pneumothorax. Mediastinal contours appear intact. Calcified and tortuous aorta. Degenerative changes in the spine and shoulders. IMPRESSION: Cardiac enlargement. Linear atelectasis or infiltration in the left base. Right lung is clear. Electronically Signed   By: Elsie Gravely M.D.   On: 02/08/2024 15:47   CT ANGIO HEAD NECK W WO CM Result Date: 02/08/2024 EXAM: CTA HEAD AND NECK WITH AND WITHOUT 02/08/2024 03:22:00 PM TECHNIQUE: CTA of the head and neck was performed with and without the administration of 50 mL iohexol  (OMNIPAQUE ) 350 MG/ML injection. Multiplanar 2D and/or 3D reformatted images are provided for review. Automated exposure control, iterative reconstruction, and/or weight based adjustment of the mA/kV was utilized to reduce the radiation dose to as low as reasonably achievable. Stenosis of the internal carotid arteries measured using NASCET criteria. COMPARISON: MR head without contrast 02/08/2024. CLINICAL HISTORY: Vertigo, central; transient room spinning dizziness with no hx  of vertigo. unsteadiness. now resolved. mild carodit bruit on exam. Unsteady gait. Transient room spinning dizziness with no history of vertigo. Symptoms resolved at present. FINDINGS: CTA NECK: AORTIC ARCH AND ARCH VESSELS: Atherosclerotic calcifications are present in the distal aortic arch. Calcifications are present in the proximal left subclavian artery without focal stenosis. No aneurysm or dissection is present. No dissection or arterial injury. No significant stenosis of the brachiocephalic or subclavian arteries. CERVICAL CAROTID ARTERIES: Atherosclerotic changes are present at the right carotid bifurcation without significant stenosis. No dissection, arterial injury, or hemodynamically significant stenosis by NASCET criteria. CERVICAL VERTEBRAL ARTERIES: The right vertebral artery is the dominant vessel. No dissection, arterial injury, or significant stenosis. LUNGS AND MEDIASTINUM: Unremarkable. SOFT TISSUES: No acute abnormality. BONES: Multilevel degenerative changes are present in the cervical spine. Slight degenerative anterolisthesis is present at C2-C3. No acute abnormality. CTA HEAD: ANTERIOR CIRCULATION: Minimal atherosclerotic calcifications are present at the right cavernous ICA without focal stenosis. No significant stenosis of the internal carotid arteries. The anterior communicating artery is patent. No significant stenosis of the anterior cerebral arteries. No significant stenosis of the middle cerebral arteries. No aneurysm. POSTERIOR CIRCULATION: The left posterior cerebral artery is of fetal type. No significant stenosis of the posterior cerebral arteries. No significant stenosis of the basilar artery. No significant stenosis of the vertebral arteries. No aneurysm. OTHER: Moderate atrophy and white matter disease is stable. The potential brainstem infarct is below the resolution of CT. Bilateral lens replacements are noted. The globes and orbits are otherwise within normal limits. No  dural venous sinus thrombosis on this non-dedicated study. IMPRESSION: 1. No large vessel occlusion, hemodynamically significant stenosis, or aneurysm in the head or neck. 2. Stable moderate cerebral atrophy and white matter disease. 3. Incidental atherosclerotic and degenerative changes without significant stenosis, see report body for details. Electronically signed by: Lonni Necessary MD 02/08/2024 03:42 PM EDT RP Workstation: [id number]   MR BRAIN WO CONTRAST Result Date: 02/08/2024 CLINICAL DATA:  Provided history: Transient ischemic attack (TIA). EXAM: MRI HEAD WITHOUT CONTRAST TECHNIQUE: Multiplanar, multiecho pulse sequences of the brain and surrounding structures were obtained without intravenous contrast. COMPARISON:  Head CT 04/28/2023.  Brain MRI 03/18/2023. FINDINGS: Brain: Mild-to-moderate generalized cerebral atrophy. Apparent punctate  focus of diffusion-weighted signal abnormality within the right aspect of the pons, which could reflect a tiny acute infarct or artifact (series 13, image 18). Redemonstrated small chronic lacunar infarct within the left frontal lobe periventricular white matter (series 12, image 21). Multifocal T2 FLAIR hyperintense signal abnormality elsewhere within the cerebral white matter, nonspecific but compatible with mild chronic small vessel ischemic disease. 4 mm meningioma overlying the anterior right frontal lobe, unchanged in size from the brain MRI of 03/18/2023. Possible additional small meningioma within the posterior fossa on the left (along the petrous left temporal bone), better appreciated on the prior contrast-enhanced MRI (series 12, image 8 on today's study). No chronic intracranial blood products. No extra-axial fluid collection. No midline shift. Vascular: Maintained flow voids within the proximal large arterial vessels. Skull and upper cervical spine: No focal worrisome marrow lesion. Incompletely assessed cervical spondylosis. Sinuses/Orbits: No mass  or acute finding within the imaged orbits. Prior bilateral ocular lens replacement. No significant paranasal sinus disease. IMPRESSION: 1. Punctate acute infarct versus artifact within the right aspect of the pons. 2. 4 mm meningioma overlying the anterior right frontal lobe, unchanged. 3. Possible additional small meningioma within the posterior fossa on the left, as described on the prior MRI. 4. Background parenchymal atrophy and chronic small vessel ischemic disease. Electronically Signed   By: Rockey Childs D.O.   On: 02/08/2024 14:37   Assessment and Plan:  Chronic HFpEF Home meds: Lasix  20 mg daily, Jardiance  10 mg daily, spironolactone  25 mg daily, Toprol  50 mg twice daily Patient appears euvolemic on exam Patient has been doing well with these medications for years Continue home medications while admitted  Atrial fibrillation, likely permanent Reported bradycardia Presented to the hospital after presyncopal episode Home meds: Toprol  50 mg twice daily, Eliquis  5 mg twice daily Reports typical HR 70s to 80s, but variable at times Denies any additional or missed doses of his medications  Reported episodes of bradycardia, HR in the 30s Upon review of telemetry, it does not appear that the patient had true heart rate in the 30s, likely that the computers picking up on frequent pauses with his A-fib Patient denies any acute symptoms, no chest pain, shortness of breath, palpitations Continue home medications at discharge  Close follow up with our outpatient office   Valvular disease Previously noted to have MR, TR s/p MitraClip, tricuspid valve clip Stable as per last echo through [facility] 02/2023 Patient not currently experiencing any concerning symptoms Continue to monitor as outpatient   Presyncope There are no indications that the syncope is cardiac in nature No acute abnormalities on EKG Lab work normal Will arrange 2 week ZIO monitor to be mailed to patients house Follow up will  be arranged with our office  Risk Assessment/Risk Scores:      [place]  Heart Association (NYHA) Functional Class NYHA Class I  CHA2DS2-VASc Score = 4   This indicates a 4.8% annual risk of stroke. The patient's score is based upon: CHF History: 1 HTN History: 1 Diabetes History: 0 Stroke History: 0 Vascular Disease History: 0 Age Score: 2 Gender Score: 0        For questions or updates, please contact [facility] Please consult [email or web address] for contact info under      Signed, Waddell DELENA Donath, PA-C  02/08/2024 4:49 PM

## 2024-02-08 NOTE — ED Provider Notes (Signed)
 [place] EMERGENCY DEPARTMENT AT [facility] Provider Note   CSN: [id number] Arrival date & time: 02/08/24  1243     Patient presents with: Dizziness   John Holder is a 89 y.o. male.   The history is provided by the patient and medical records. No language interpreter was used.  Dizziness Quality:  Lightheadedness, imbalance, head spinning and room spinning Severity:  Severe Onset quality:  Sudden Duration: 10 mins. Timing:  Unable to specify Progression:  Resolved Relieved by:  Nothing Worsened by:  Nothing Ineffective treatments:  None tried Associated symptoms: no chest pain, no diarrhea, no headaches, no nausea, no palpitations, no shortness of breath, no syncope, no vision changes, no vomiting and no weakness   Risk factors: no hx of vertigo        Prior to Admission medications   Medication Sig Start Date End Date Taking? Authorizing Provider  ALPRAZolam  (XANAX ) 0.25 MG tablet Take 1 tablet (0.25 mg total) by mouth every 6 (six) hours as needed for anxiety 09/26/23     apixaban  (ELIQUIS ) 5 MG TABS tablet Take 1 tablet (5 mg total) by mouth 2 (two) times daily. 03/30/23   Rolan Ezra RAMAN, MD  [facility] EXTRA STRENGTH PO) Take 1 tablet by mouth daily. 10/16/18   [provider]  Barberry-Oreg Grape-Goldenseal (BERBERINE COMPLEX PO) Take 2,400 mg by mouth daily.    [provider]  Cholecalciferol 50 MCG (2000 UT) CAPS take one capsule Orally daily    [provider]  Chromium 1000 MCG TABS Take 1,000 mcg by mouth daily. chromium picolinate    [provider]  Coenzyme Q10 (CO Q10) 200 MG CAPS Take 200 mg by mouth daily. 03/06/04   [provider]  empagliflozin  (JARDIANCE ) 10 MG TABS tablet Take 1 tablet (10 mg total) by mouth daily before breakfast. 03/09/23   Rolan Ezra RAMAN, MD  ferrous gluconate  (FERGON) 324 MG tablet Take 324 mg by mouth 3 (three) times a week.    [provider]  furosemide   (LASIX ) 20 MG tablet Take 1 tablet (20 mg total) by mouth daily. 03/09/23   Rolan Ezra RAMAN, MD  HYDROcodone -acetaminophen  (NORCO/VICODIN) 5-325 MG tablet Take 0.5 tablets by mouth every 6 (six) hours as needed for severe pain (pain score 7-10). 01/12/24   Daralene Lonni BIRCH, PA-C  losartan  (COZAAR ) 25 MG tablet Take 1 tablet (25 mg total) by mouth daily. 11/07/23   Rolan Ezra RAMAN, MD  MAGNESIUM  GLYCINATE PO Take 525 mg by mouth in the morning and at bedtime. 06/16/18   [provider]  Melatonin 10 MG TABS Take 10 mg by mouth at bedtime. 03/06/04   [provider]  metoprolol  succinate (TOPROL -XL) 50 MG 24 hr tablet Take 1 tablet (50 mg total) by mouth 2 (two) times daily. Take with or immediately following a meal 11/25/23   Rolan Ezra RAMAN, MD  Misc Natural Products (ADVANCED JOINT RELIEF PO) Take 1 tablet by mouth 2 (two) times daily.    [provider]  Multiple Vitamin (MULTIVITAMIN) capsule Take 1 capsule by mouth daily. Mega men 50 plus    [provider]  Multiple Vitamins-Minerals (ZINC PO) Take by mouth daily.    [provider]  NON FORMULARY Take 300 mg by mouth 2 (two) times daily. TRU vitamin  Nigen    [provider]  omeprazole  (PRILOSEC) 20 MG capsule Take 1 capsule (20 mg total) by mouth daily. 05/04/23     OVER THE  COUNTER MEDICATION Take 2 capsules by mouth daily. [employer]ac[employer]    [provider]  OVER THE COUNTER MEDICATION Take 1 capsule by mouth 2 (two) times daily. [employer], Historical, MD  OVER THE COUNTER MEDICATION Take 1 tablet by mouth 2 (two) times daily. virectin    [provider]  OVER THE COUNTER MEDICATION Take 1 tablet by mouth 2 (two) times daily. Liver care    [provider]  OVER THE COUNTER MEDICATION Take 1 tablet by mouth 2 (two) times daily. Tri Flex    [provider]  OVER THE COUNTER MEDICATION Take 1 capsule by mouth 2 (two) times daily.  conjugated linoleic acid    [provider]  OVER THE COUNTER MEDICATION Take 1 tablet by mouth daily in the afternoon. CDB Gummies    [provider]  OVER THE COUNTER MEDICATION Take 1 capsule by mouth daily in the afternoon. Hemp oil gummies    [provider]  OVER THE COUNTER MEDICATION Take 1 tablet by mouth daily. arthrozene    [provider]  Polyethyl Glycol-Propyl Glycol (SYSTANE) 0.4-0.3 % SOLN 1 drop daily as needed (as needed for dry eye).    [provider]  Sodium Hyaluronate, oral, (HYALURONIC ACID) 100 MG CAPS Take 100 mg by mouth 2 (two) times daily.    [provider]  spironolactone  (ALDACTONE ) 25 MG tablet Take 1 tablet (25 mg total) by mouth daily. 03/09/23   Rolan Ezra RAMAN, MD  tadalafil (CIALIS) 20 MG tablet TAKE ONE TABLET BY MOUTH DAILY AS NEEDED 03/10/20   Wrenn, John, MD    Allergies: Patient has no known allergies.    Review of Systems  Constitutional:  Negative for chills and fatigue.  HENT:  Negative for congestion.   Eyes:  Negative for visual disturbance.  Respiratory:  Negative for cough, chest tightness and shortness of breath.   Cardiovascular:  Negative for chest pain, palpitations and syncope.  Gastrointestinal:  Negative for abdominal pain, constipation, diarrhea, nausea and vomiting.  Genitourinary:  Negative for dysuria and flank pain.  Musculoskeletal:  Negative for back pain, neck pain and neck stiffness.  Skin:  Negative for rash and wound.  Neurological:  Positive for dizziness and light-headedness. Negative for seizures, syncope, speech difficulty, weakness and headaches.  Psychiatric/Behavioral:  Negative for agitation and confusion.   All other systems reviewed and are negative.   Updated Vital Signs BP 117/80   Pulse 79   Temp 97.6 F (36.4 C) (Oral)   Resp 19   Ht 5' 11 (1.803 m)   Wt 69.9 kg   SpO2 100%   BMI 21.48 kg/m   Physical Exam Vitals and nursing note reviewed.   Constitutional:      General: He is not in acute distress.    Appearance: He is well-developed. He is not ill-appearing, toxic-appearing or diaphoretic.  HENT:     Head: Normocephalic and atraumatic.     Nose: No congestion or rhinorrhea.     Mouth/Throat:     Mouth: Mucous membranes are dry.     Pharynx: No oropharyngeal exudate or posterior oropharyngeal erythema.  Eyes:     Extraocular Movements: Extraocular movements intact.     Conjunctiva/sclera: Conjunctivae normal.     Pupils: Pupils are equal, round, and reactive to light.  Neck:     Vascular: Carotid bruit present.  Cardiovascular:     Rate and Rhythm: Bradycardia present. Rhythm irregular.     Heart  sounds: Murmur heard.  Pulmonary:     Effort: Pulmonary effort is normal. No respiratory distress.     Breath sounds: Normal breath sounds. No wheezing, rhonchi or rales.  Chest:     Chest wall: No tenderness.  Abdominal:     Palpations: Abdomen is soft.     Tenderness: There is no abdominal tenderness. There is no guarding or rebound.  Musculoskeletal:        General: No swelling or tenderness.     Cervical back: Neck supple. No tenderness.  Skin:    General: Skin is warm and dry.     Capillary Refill: Capillary refill takes less than 2 seconds.     Findings: No erythema.  Neurological:     General: No focal deficit present.     Mental Status: He is alert.     Sensory: No sensory deficit.     Motor: No weakness.  Psychiatric:        Mood and Affect: Mood normal.     (all labs ordered are listed, but only abnormal results are displayed) Labs Reviewed  CBC WITH DIFFERENTIAL/PLATELET - Abnormal; Notable for the following components:      Result Value   Platelets 135 (*)    All other components within normal limits  COMPREHENSIVE METABOLIC PANEL WITH GFR - Abnormal; Notable for the following components:   Glucose, Bld 113 (*)    BUN 24 (*)    Total Bilirubin 1.4 (*)    All other components within normal  limits  BRAIN NATRIURETIC PEPTIDE - Abnormal; Notable for the following components:   B Natriuretic Peptide 271.5 (*)    All other components within normal limits  URINALYSIS, W/ REFLEX TO CULTURE (INFECTION SUSPECTED) - Abnormal; Notable for the following components:   Glucose, UA >=500 (*)    Bacteria, UA RARE (*)    All other components within normal limits  TSH  MAGNESIUM   TSH  TYPE AND SCREEN    EKG: EKG Interpretation Date/Time:  Wednesday February 08 2024 13:00:24 EDT Ventricular Rate:  62 PR Interval:    QRS Duration:  132 QT Interval:  440 QTC Calculation: 447 R Axis:   53  Text Interpretation: Atrial fibrillation IVCD, consider atypical RBBB when compared to ECG before today, similar afib with  slower rate No STEMI Confirmed by Ginger Barefoot ([id number]) on 02/08/2024 1:24:33 PM  Radiology: DG Chest Portable 1 View Result Date: 02/08/2024 CLINICAL DATA:  Near syncope with lightheadedness. Dizziness and unsteadiness. EXAM: PORTABLE CHEST 1 VIEW COMPARISON:  03/17/2021 FINDINGS: Cardiac enlargement. Linear atelectasis or infiltration in the left base. No airspace disease or consolidation otherwise seen in the lungs. No pleural effusion or pneumothorax. Mediastinal contours appear intact. Calcified and tortuous aorta. Degenerative changes in the spine and shoulders. IMPRESSION: Cardiac enlargement. Linear atelectasis or infiltration in the left base. Right lung is clear. Electronically Signed   By: Elsie Gravely M.D.   On: 02/08/2024 15:47   CT ANGIO HEAD NECK W WO CM Result Date: 02/08/2024 EXAM: CTA HEAD AND NECK WITH AND WITHOUT 02/08/2024 03:22:00 PM TECHNIQUE: CTA of the head and neck was performed with and without the administration of 50 mL iohexol  (OMNIPAQUE ) 350 MG/ML injection. Multiplanar 2D and/or 3D reformatted images are provided for review. Automated exposure control, iterative reconstruction, and/or weight based adjustment of the mA/kV was utilized to reduce the  radiation dose to as low as reasonably achievable. Stenosis of the internal carotid arteries measured using NASCET criteria. COMPARISON: MR head without  contrast 02/08/2024. CLINICAL HISTORY: Vertigo, central; transient room spinning dizziness with no hx of vertigo. unsteadiness. now resolved. mild carodit bruit on exam. Unsteady gait. Transient room spinning dizziness with no history of vertigo. Symptoms resolved at present. FINDINGS: CTA NECK: AORTIC ARCH AND ARCH VESSELS: Atherosclerotic calcifications are present in the distal aortic arch. Calcifications are present in the proximal left subclavian artery without focal stenosis. No aneurysm or dissection is present. No dissection or arterial injury. No significant stenosis of the brachiocephalic or subclavian arteries. CERVICAL CAROTID ARTERIES: Atherosclerotic changes are present at the right carotid bifurcation without significant stenosis. No dissection, arterial injury, or hemodynamically significant stenosis by NASCET criteria. CERVICAL VERTEBRAL ARTERIES: The right vertebral artery is the dominant vessel. No dissection, arterial injury, or significant stenosis. LUNGS AND MEDIASTINUM: Unremarkable. SOFT TISSUES: No acute abnormality. BONES: Multilevel degenerative changes are present in the cervical spine. Slight degenerative anterolisthesis is present at C2-C3. No acute abnormality. CTA HEAD: ANTERIOR CIRCULATION: Minimal atherosclerotic calcifications are present at the right cavernous ICA without focal stenosis. No significant stenosis of the internal carotid arteries. The anterior communicating artery is patent. No significant stenosis of the anterior cerebral arteries. No significant stenosis of the middle cerebral arteries. No aneurysm. POSTERIOR CIRCULATION: The left posterior cerebral artery is of fetal type. No significant stenosis of the posterior cerebral arteries. No significant stenosis of the basilar artery. No significant stenosis of the  vertebral arteries. No aneurysm. OTHER: Moderate atrophy and white matter disease is stable. The potential brainstem infarct is below the resolution of CT. Bilateral lens replacements are noted. The globes and orbits are otherwise within normal limits. No dural venous sinus thrombosis on this non-dedicated study. IMPRESSION: 1. No large vessel occlusion, hemodynamically significant stenosis, or aneurysm in the head or neck. 2. Stable moderate cerebral atrophy and white matter disease. 3. Incidental atherosclerotic and degenerative changes without significant stenosis, see report body for details. Electronically signed by: Lonni Necessary MD 02/08/2024 03:42 PM EDT RP Workstation: [id number]   MR BRAIN WO CONTRAST Result Date: 02/08/2024 CLINICAL DATA:  Provided history: Transient ischemic attack (TIA). EXAM: MRI HEAD WITHOUT CONTRAST TECHNIQUE: Multiplanar, multiecho pulse sequences of the brain and surrounding structures were obtained without intravenous contrast. COMPARISON:  Head CT 04/28/2023.  Brain MRI 03/18/2023. FINDINGS: Brain: Mild-to-moderate generalized cerebral atrophy. Apparent punctate focus of diffusion-weighted signal abnormality within the right aspect of the pons, which could reflect a tiny acute infarct or artifact (series 13, image 18). Redemonstrated small chronic lacunar infarct within the left frontal lobe periventricular white matter (series 12, image 21). Multifocal T2 FLAIR hyperintense signal abnormality elsewhere within the cerebral white matter, nonspecific but compatible with mild chronic small vessel ischemic disease. 4 mm meningioma overlying the anterior right frontal lobe, unchanged in size from the brain MRI of 03/18/2023. Possible additional small meningioma within the posterior fossa on the left (along the petrous left temporal bone), better appreciated on the prior contrast-enhanced MRI (series 12, image 8 on today's study). No chronic intracranial blood products. No  extra-axial fluid collection. No midline shift. Vascular: Maintained flow voids within the proximal large arterial vessels. Skull and upper cervical spine: No focal worrisome marrow lesion. Incompletely assessed cervical spondylosis. Sinuses/Orbits: No mass or acute finding within the imaged orbits. Prior bilateral ocular lens replacement. No significant paranasal sinus disease. IMPRESSION: 1. Punctate acute infarct versus artifact within the right aspect of the pons. 2. 4 mm meningioma overlying the anterior right frontal lobe, unchanged. 3. Possible additional small meningioma within the posterior fossa on  the left, as described on the prior MRI. 4. Background parenchymal atrophy and chronic small vessel ischemic disease. Electronically Signed   By: Rockey Childs D.O.   On: 02/08/2024 14:37     Procedures   Medications Ordered in the ED  ALPRAZolam  (XANAX ) tablet 0.25 mg (has no administration in time range)  apixaban  (ELIQUIS ) tablet 5 mg (has no administration in time range)  empagliflozin  (JARDIANCE ) tablet 10 mg (has no administration in time range)  ferrous gluconate  (FERGON) tablet 324 mg (has no administration in time range)  furosemide  (LASIX ) tablet 20 mg (has no administration in time range)  HYDROcodone -acetaminophen  (NORCO/VICODIN) 5-325 MG per tablet 0.5 tablet (has no administration in time range)  losartan  (COZAAR ) tablet 25 mg (has no administration in time range)  Melatonin TABS 5 mg (has no administration in time range)  metoprolol  succinate (TOPROL -XL) 24 hr tablet 50 mg (has no administration in time range)  pantoprazole  (PROTONIX ) EC tablet 40 mg (has no administration in time range)  spironolactone  (ALDACTONE ) tablet 25 mg (has no administration in time range)  iohexol  (OMNIPAQUE ) 350 MG/ML injection 50 mL (50 mLs Intravenous Contrast Given 02/08/24 1519)                                    Medical Decision Making Amount and/or Complexity of Data Reviewed Labs:  ordered. Radiology: ordered.  Risk Prescription drug management. Decision regarding hospitalization.    John Holder is a 89 y.o. male with a past medical history including chronic atrial fibrillation, pulmonary hypertension, previous mitral valve and tricuspid valve procedures, GERD, hypertension, CHF, and previous symptomatic anemia who presents with an episode of dizziness, unsteadiness, and lightheadedness at about 11 AM today.  He says that 11 AM, he just got home after having a cast removed from his left arm when he suddenly was hit with dizziness.  He reports that it felt like everything was spinning around him and he was very unsteady.  He was also lightheaded and felt like he might pass out.  He denied any chest pain, palpitations, shortness of breath and did not have nausea or vomiting.  Denies any head trauma.  Was not complete of any pain or headache.  He has never had vertigo before but has felt lightheaded in the setting of symptomatic anemia in the past.  He denies recent rectal bleeding nausea or vomiting.  Denies any leg pain or leg swelling.  He reports it last about 10 minutes and he had to be guided with his family to the bedroom to sit down.  He reports it then resolved and he is feeling at his baseline now.  Glucose per EMS was about 120.  He denied any neurologic complaints otherwise with no speech changes, vision changes, numbness, tingling, or weakness of any extremity.  On exam, lungs clear.  Chest nontender.  Abdomen nontender.  He has a real wrist brace in his left wrist but otherwise he has intact sensation strength and pulses.  Left wrist was covered by the brace so did not check arterial pulse in the left arm.  Legs nontender nonedematous.  Slight murmur and I did feel I heard a slight carotid bruit bilaterally on his neck.  Symmetric smile.  Clear speech.  Pupils symmetric and reactive with normal extraocular movements.  Clinically I am somewhat concerned about the  sudden onset and resolution of these symptoms.  He denied a prodrome and  just hit him hard and fast.  We discussed the possibility of vertigo but given the unsteadiness and dizziness he was experiencing aside from just the lightheadedness, I do feel need to rule out a neurologic etiology.  We will get CTA of the head and neck especially with the possible carotid bruit and MRI of the brain.  During my initial exam his heart rate did drop into the 30s with what appeared to be A-fib.  Symptomatic bradycardia also consideration.  He will get cardiac workup and anticipate discussion with cardiology.  Will get labs, chest x-ray, and we will check for anemia given his similar lightheadedness in the setting of anemia before.  Anticipate reassessment after workup to determine disposition.  3:14 PM MRI shows possible stroke.  Neurology was called who will come see him and we will wait for the CTA head and neck.  I also called and spoke with cardiology given the transient bradycardia and they do think he needs admission and they will see him to discuss if he needs a pacemaker or not.  Will plan for admission after workup is completed.  3:54 PM Neurology feels the patient does not have a stroke and it was artifact.  CTA reassuring.  I informed the patient of this.  Cardiology does want him to admitted to medicine and they will see in consultation given the transient bradycardia with A-fib.  Medicine will admit for further management.     Final diagnoses:  Bradycardia  Near syncope  Dizziness  Lightheadedness    Clinical Impression: 1. Bradycardia   2. Near syncope   3. Dizziness   4. Lightheadedness     Disposition: Admit  This note was prepared with assistance of Conservation officer, historic buildings. Occasional wrong-word or sound-a-like substitutions may have occurred due to the inherent limitations of voice recognition software.        Kemoni Ortega, Lonni PARAS, MD 02/08/24 1630

## 2024-02-08 NOTE — ED Triage Notes (Signed)
 Pt BIB EMS from home after near syncope episode. Pt was able to ambulate from bathroom to chair in living room. Pt endorses dizziness lasted aprox 10 min. Vitals stable with EMS. Hx of Afib and anemia. Pt Aox4 upon arrival.

## 2024-02-08 NOTE — Progress Notes (Signed)
 Ordering 2 week ZIO to be mailed to patients house

## 2024-02-09 ENCOUNTER — Telehealth: Payer: Self-pay | Admitting: *Deleted

## 2024-02-09 NOTE — Telephone Encounter (Signed)
  Patient is requesting appointment to have monitor put on

## 2024-02-13 NOTE — Telephone Encounter (Signed)
 Patient is out of town and has had mail held.  His monitor is being held at the post office at the request of the customer.  We were not able to schedule a time for him to come in to have the monitor applied as he is going back out of town until 10/13. Patient will attempt to apply the ZIO patch monitor himself.

## 2024-02-13 NOTE — H&P (Signed)
 History and Physical    Patient: John Holder FMW:[id number] DOB: [birth year] DOA: 02/08/2024 DOS: the patient was seen and examined on 02/13/2024 PCP: Shepard Ade, MD  Patient coming from: Home  Chief Complaint:  Chief Complaint  Patient presents with   Dizziness   HPI: John Holder is a 89 y.o. male with medical history significant of John Holder is a 89 y.o. male with medical history significant of PAF, HFpEF, severe symptomatic nonrheumatic tricuspid insufficiency s/p tTEER with 2 clip devices (XTW, XTW) by Dr. Consepcion 02/10/2022, pulmonary HTN, HTN, IDA, and L renal mass s/p L nephrectomy p/w pre-syncope and found to have c/f symptomatic bradycardia.    In the ED, pt bradycardic to 30s very briefly. Labs notable for BNP 271.5. MRI showed  punctate acute infarct versus artifact within the right aspect of the pons, and Neurology confirmed this was artifact (no infarct). EDP consulted cards who evaluated pt and determined very little bradycardia was actually present; thus, pt was deemed stable for discharge with plan to have 2 week Holter monitor placed and f/u w/ OP cardiologist. Pt and wife in agreement.     Review of Systems: As mentioned in the history of present illness. All other systems reviewed and are negative. Past Medical History:  Diagnosis Date   Blood transfusion without reported diagnosis    Cataract    removed in replace bilateral   CHF (congestive heart failure) (HCC)    Dysrhythmia    GERD (gastroesophageal reflux disease)    Hypertension    Iron  deficiency anemia    amdx 08/2018 >> Tx with PRBCs and Feraheme  started; no GI source per workup; followed by Heme (Dr. Layla) and GI (Dr. Donnald)    Left renal mass    s/p L nephrectomy   Mitral regurgitation    Echo 02/2016:  Mild conc LVH, EF 55-60, mild to mod MR, mod LAE, mod to severe RAE, mod TR, PASP 41 // Echo 10/2018: EF 60-65, mod conc LVH, severe BAE, trivial eff, mild MVP with mod MR, mod to severe TR,  asc aorta 36 (mild dilation)    Persistent Atrial Fibrillation    Pulmonary hypertension (HCC)    Echocardiogram 7/22: EF 60-65, no RWMA, normal RVSF, RVSP 70.7 (severe pulmonary hypertension), severe BAE, mod MR, severe TR, mild to mod AV sclerosis w/o AS   S/P mitral valve clip implantation 02/26/2021   G4 NT W device; A3/P3 with Dr. Wonda   Past Surgical History:  Procedure Laterality Date   CARDIAC CATHETERIZATION     CARDIOVERSION N/A 02/04/2016   Procedure: CARDIOVERSION;  Surgeon: Aleene JINNY Passe, MD;  Location: [facility] ENDOSCOPY;  Service: Cardiovascular;  Laterality: N/A;   CHOLECYSTECTOMY     COLONOSCOPY     COLONOSCOPY WITH PROPOFOL  N/A 09/10/2018   Procedure: COLONOSCOPY WITH PROPOFOL ;  Surgeon: Donnald Charleston, MD;  Location: [facility] ENDOSCOPY;  Service: Endoscopy;  Laterality: N/A;   ESOPHAGOGASTRODUODENOSCOPY (EGD) WITH PROPOFOL  N/A 09/10/2018   Procedure: ESOPHAGOGASTRODUODENOSCOPY (EGD) WITH PROPOFOL ;  Surgeon: Donnald Charleston, MD;  Location: [facility] ENDOSCOPY;  Service: Endoscopy;  Laterality: N/A;   EYE SURGERY     GIVENS CAPSULE STUDY N/A 09/10/2018   Procedure: GIVENS CAPSULE STUDY;  Surgeon: Donnald Charleston, MD;  Location: [facility] ENDOSCOPY;  Service: Endoscopy;  Laterality: N/A;   NEPHRECTOMY Left 2002   RIGHT/LEFT HEART CATH AND CORONARY ANGIOGRAPHY N/A 01/27/2021   Procedure: RIGHT/LEFT HEART CATH AND CORONARY ANGIOGRAPHY;  Surgeon: Rolan Ezra RAMAN, MD;  Location: [facility] INVASIVE CV LAB;  Service: Cardiovascular;  Laterality: N/A;   TEE WITHOUT CARDIOVERSION N/A 01/27/2021   Procedure: TRANSESOPHAGEAL ECHOCARDIOGRAM (TEE);  Surgeon: Rolan Ezra RAMAN, MD;  Location: [facility] ENDOSCOPY;  Service: Cardiovascular;  Laterality: N/A;   TEE WITHOUT CARDIOVERSION N/A 02/26/2021   Procedure: TRANSESOPHAGEAL ECHOCARDIOGRAM (TEE);  Surgeon: Wonda Sharper, MD;  Location: [facility] INVASIVE CV LAB;  Service: Cardiovascular;  Laterality: N/A;   TEE WITHOUT CARDIOVERSION N/A 02/27/2021   Procedure:  TRANSESOPHAGEAL ECHOCARDIOGRAM (TEE);  Surgeon: Rolan Ezra RAMAN, MD;  Location: [facility] ENDOSCOPY;  Service: Cardiovascular;  Laterality: N/A;   TEE WITHOUT CARDIOVERSION N/A 03/19/2021   Procedure: TRANSESOPHAGEAL ECHOCARDIOGRAM (TEE);  Surgeon: Wonda Sharper, MD;  Location: [facility] INVASIVE CV LAB;  Service: Cardiovascular;  Laterality: N/A;   TONSILLECTOMY     TRANSCATHETER MITRAL EDGE TO EDGE REPAIR N/A 02/26/2021   Procedure: MITRAL VALVE REPAIR;  Surgeon: Wonda Sharper, MD;  Location: [facility] INVASIVE CV LAB;  Service: Cardiovascular;  Laterality: N/A;   TRANSCATHETER MITRAL EDGE TO EDGE REPAIR N/A 03/19/2021   Procedure: MITRAL VALVE REPAIR;  Surgeon: Wonda Sharper, MD;  Location: [facility] INVASIVE CV LAB;  Service: Cardiovascular;  Laterality: N/A;   Social History:  reports that he quit smoking about 45 years ago. His smoking use included pipe. He has never used smokeless tobacco. He reports current alcohol  use. He reports that he does not use drugs.  No Known Allergies  Family History  Problem Relation Age of Onset   Breast cancer Mother 85   Hypertension Father    Diabetes Neg Hx    Stroke Neg Hx    CAD Neg Hx    Colon cancer Neg Hx    Colon polyps Neg Hx    Esophageal cancer Neg Hx    Rectal cancer Neg Hx    Stomach cancer Neg Hx     Prior to Admission medications   Medication Sig Start Date End Date Taking? Authorizing Provider  ALPRAZolam  (XANAX ) 0.25 MG tablet Take 1 tablet (0.25 mg total) by mouth every 6 (six) hours as needed for anxiety 09/26/23     apixaban  (ELIQUIS ) 5 MG TABS tablet Take 1 tablet (5 mg total) by mouth 2 (two) times daily. 03/30/23   Rolan Ezra RAMAN, MD  Apoaequorin [facility] EXTRA STRENGTH PO) Take 1 tablet by mouth daily. 10/16/18   [provider]  Barberry-Oreg Grape-Goldenseal (BERBERINE COMPLEX PO) Take 2,400 mg by mouth daily.    [provider]  Cholecalciferol 50 MCG (2000 UT) CAPS take one capsule Orally daily    [provider]  Chromium 1000 MCG TABS Take 1,000 mcg by mouth daily. chromium picolinate    [provider]  Coenzyme Q10 (CO Q10) 200 MG CAPS Take 200 mg by mouth daily. 03/06/04   [provider]  empagliflozin  (JARDIANCE ) 10 MG TABS tablet Take 1 tablet (10 mg total) by mouth daily before breakfast. 03/09/23   Rolan Ezra RAMAN, MD  ferrous gluconate  [facility]) 324 MG tablet Take 324 mg by mouth 3 (three) times a week.    [provider]  furosemide  (LASIX ) 20 MG tablet Take 1 tablet (20 mg total) by mouth daily. 03/09/23   Rolan Ezra RAMAN, MD  HYDROcodone -acetaminophen  (NORCO/VICODIN) 5-325 MG tablet Take 0.5 tablets by mouth every 6 (six) hours as needed for severe pain (pain score 7-10). 01/12/24   Daralene Lonni BIRCH, PA-C  losartan  (COZAAR ) 25 MG tablet Take 1 tablet (25 mg total) by mouth daily. 11/07/23   Rolan Ezra RAMAN, MD  MAGNESIUM  GLYCINATE PO  Take 525 mg by mouth in the morning and at bedtime. 06/16/18   [provider]  Melatonin 10 MG TABS Take 10 mg by mouth at bedtime. 03/06/04   [provider]  metoprolol  succinate (TOPROL -XL) 50 MG 24 hr tablet Take 1 tablet (50 mg total) by mouth 2 (two) times daily. Take with or immediately following a meal 11/25/23   Rolan Ezra RAMAN, MD  Misc Natural Products (ADVANCED JOINT RELIEF PO) Take 1 tablet by mouth 2 (two) times daily.    [provider]  Multiple Vitamin (MULTIVITAMIN) capsule Take 1 capsule by mouth daily. Mega men 50 plus    [provider]  Multiple Vitamins-Minerals (ZINC PO) Take by mouth daily.    [provider]  NON FORMULARY Take 300 mg by mouth 2 (two) times daily. TRU vitamin  Nigen    [provider]  omeprazole  (PRILOSEC) 20 MG capsule Take 1 capsule (20 mg total) by mouth daily. 05/04/23     OVER THE COUNTER MEDICATION Take 2 capsules by mouth daily. Prostacare    [provider]  OVER THE COUNTER MEDICATION Take 1 capsule by mouth 2 (two)  times daily. [employer], Historical, MD  OVER THE COUNTER MEDICATION Take 1 tablet by mouth 2 (two) times daily. virectin    [provider]  OVER THE COUNTER MEDICATION Take 1 tablet by mouth 2 (two) times daily. Liver care    [provider]  OVER THE COUNTER MEDICATION Take 1 tablet by mouth 2 (two) times daily. Tri Flex    [provider]  OVER THE COUNTER MEDICATION Take 1 capsule by mouth 2 (two) times daily. conjugated linoleic acid    [provider]  OVER THE COUNTER MEDICATION Take 1 tablet by mouth daily in the afternoon. CDB Gummies    [provider]  OVER THE COUNTER MEDICATION Take 1 capsule by mouth daily in the afternoon. Hemp oil gummies    [provider]  OVER THE COUNTER MEDICATION Take 1 tablet by mouth daily. arthrozene    [provider]  Polyethyl Glycol-Propyl Glycol (SYSTANE) 0.4-0.3 % SOLN 1 drop daily as needed (as needed for dry eye).    [provider]  Sodium Hyaluronate, oral, (HYALURONIC ACID) 100 MG CAPS Take 100 mg by mouth 2 (two) times daily.    [provider]  spironolactone  (ALDACTONE ) 25 MG tablet Take 1 tablet (25 mg total) by mouth daily. 03/09/23   Rolan Ezra RAMAN, MD  tadalafil (CIALIS) 20 MG tablet TAKE ONE TABLET BY MOUTH DAILY AS NEEDED 03/10/20   Watt Rush, MD    Physical Exam: Vitals:   02/08/24 1622 02/08/24 1625 02/08/24 1700 02/08/24 1724  BP:   125/84 (!) 144/93  Pulse: 68 (!) 34 62 74  Resp: (!) 23 19 20 18   Temp:    97.9 F (36.6 C)  TempSrc:    Oral  SpO2: 100% 100% 100% 100%  Weight:      Height:       General: Alert, oriented x3, resting comfortably in no acute distress HEENT: EOMI, oropharynx clear, moist mucous membranes, hearing intact Neck: Trachea midline and no gross thyromegaly Respiratory: Lungs clear to auscultation bilaterally with normal respiratory effort; no w/r/r Cardiovascular: Regular rate and rhythm w/o  m/r/g Abdomen: Soft, nontender, nondistended. Positive bowel sounds MSK: No obvious joint deformities or swelling Skin: No obvious rashes or lesions Neurologic: Awake, alert, spontaneously moves all extremities, strength intact Psychiatric:  Appropriate mood and affect, conversational and cooperative  Data Reviewed:  Lab Results  Component Value Date   WBC 4.2 02/08/2024   HGB 15.1 02/08/2024   HCT 44.9 02/08/2024   MCV 93.0 02/08/2024   PLT 135 (L) 02/08/2024   Lab Results  Component Value Date   GLUCOSE 113 (H) 02/08/2024   CALCIUM 9.4 02/08/2024   NA 136 02/08/2024   K 3.9 02/08/2024   CO2 24 02/08/2024   CL 103 02/08/2024   BUN 24 (H) 02/08/2024   CREATININE 1.06 02/08/2024   Lab Results  Component Value Date   ALT 19 02/08/2024   AST 36 02/08/2024   ALKPHOS 61 02/08/2024   BILITOT 1.4 (H) 02/08/2024   Lab Results  Component Value Date   INR 1.3 (H) 03/17/2021   INR 1.2 02/23/2021   INR 1.4 (H) 10/22/2019   Radiology: No results found.   Assessment and Plan: John Holder is a 89 y.o. male with medical history significant of PAF, HFpEF, severe symptomatic nonrheumatic tricuspid insufficiency s/p tTEER with 2 clip devices (XTW, XTW) by Dr. Consepcion 02/10/2022, pulmonary HTN, HTN, IDA, and L renal mass s/p L nephrectomy p/w pre-syncope and found to have c/f symptomatic bradycardia.    In the ED, pt bradycardic to 30s very briefly. Labs notable for BNP 271.5. MRI showed  punctate acute infarct versus artifact within the right aspect of the pons, and Neurology confirmed this was artifact (no infarct). EDP consulted cards who evaluated pt and determined very little bradycardia was actually present; thus, pt was deemed stable for discharge with plan to have 2 week Holter monitor placed and f/u w/ OP cardiologist. Pt and wife in agreement.      Advance Care Planning:   Code Status: Prior   Consults: Cards  Family Communication: Wife  Severity of Illness: The  appropriate patient status for this patient is OBSERVATION. Observation status is judged to be reasonable and necessary in order to provide the required intensity of service to ensure the patient's safety. The patient's presenting symptoms, physical exam findings, and initial radiographic and laboratory data in the context of their medical condition is felt to place them at decreased risk for further clinical deterioration. Furthermore, it is anticipated that the patient will be medically stable for discharge from the hospital within 2 midnights of admission.   Author: Marsha Ada, MD 02/13/2024 3:35 PM  For on call review [email or web address].

## 2024-02-16 DIAGNOSIS — I361 Nonrheumatic tricuspid (valve) insufficiency: Secondary | ICD-10-CM | POA: Diagnosis not present

## 2024-02-16 DIAGNOSIS — Z006 Encounter for examination for normal comparison and control in clinical research program: Secondary | ICD-10-CM | POA: Diagnosis not present

## 2024-02-17 ENCOUNTER — Other Ambulatory Visit (HOSPITAL_COMMUNITY): Payer: Self-pay | Admitting: Cardiology

## 2024-02-17 ENCOUNTER — Other Ambulatory Visit: Payer: Self-pay

## 2024-02-17 ENCOUNTER — Other Ambulatory Visit (HOSPITAL_COMMUNITY): Payer: Self-pay

## 2024-02-17 DIAGNOSIS — I482 Chronic atrial fibrillation, unspecified: Secondary | ICD-10-CM | POA: Diagnosis not present

## 2024-02-17 DIAGNOSIS — R55 Syncope and collapse: Secondary | ICD-10-CM | POA: Diagnosis not present

## 2024-02-17 DIAGNOSIS — D6869 Other thrombophilia: Secondary | ICD-10-CM | POA: Diagnosis not present

## 2024-02-17 DIAGNOSIS — R001 Bradycardia, unspecified: Secondary | ICD-10-CM | POA: Diagnosis not present

## 2024-02-17 MED ORDER — METOPROLOL SUCCINATE ER 50 MG PO TB24
50.0000 mg | ORAL_TABLET | Freq: Two times a day (BID) | ORAL | 0 refills | Status: DC
  Filled 2024-02-17: qty 180, 90d supply, fill #0

## 2024-02-20 DIAGNOSIS — R399 Unspecified symptoms and signs involving the genitourinary system: Secondary | ICD-10-CM | POA: Diagnosis not present

## 2024-02-20 DIAGNOSIS — R3121 Asymptomatic microscopic hematuria: Secondary | ICD-10-CM | POA: Diagnosis not present

## 2024-02-20 DIAGNOSIS — N401 Enlarged prostate with lower urinary tract symptoms: Secondary | ICD-10-CM | POA: Diagnosis not present

## 2024-02-20 DIAGNOSIS — D49511 Neoplasm of unspecified behavior of right kidney: Secondary | ICD-10-CM | POA: Diagnosis not present

## 2024-02-28 DIAGNOSIS — M25532 Pain in left wrist: Secondary | ICD-10-CM | POA: Diagnosis not present

## 2024-03-01 DIAGNOSIS — S52502D Unspecified fracture of the lower end of left radius, subsequent encounter for closed fracture with routine healing: Secondary | ICD-10-CM | POA: Diagnosis not present

## 2024-03-05 ENCOUNTER — Telehealth (HOSPITAL_COMMUNITY): Payer: Self-pay

## 2024-03-05 NOTE — Telephone Encounter (Signed)
 Called to confirm/remind patient of their appointment at the [facility] on 03/06/24 11:00.   Appointment:   [] Confirmed  [x] Left mess   [] No answer/No voice mail  [] VM Full/unable to leave message  [] Phone not in service  Patient reminded to bring all medications and/or complete list.  Confirmed patient has transportation. Gave directions, instructed to utilize valet parking.

## 2024-03-06 ENCOUNTER — Encounter (HOSPITAL_COMMUNITY): Payer: Self-pay

## 2024-03-06 ENCOUNTER — Ambulatory Visit (HOSPITAL_COMMUNITY)
Admission: RE | Admit: 2024-03-06 | Discharge: 2024-03-06 | Disposition: A | Discharge status: Home / Self Care | Source: Ambulatory Visit | Attending: Cardiology | Admitting: Cardiology

## 2024-03-06 ENCOUNTER — Other Ambulatory Visit (HOSPITAL_COMMUNITY): Payer: Self-pay

## 2024-03-06 VITALS — BP 110/62 | HR 67 | Ht 71.0 in | Wt 162.0 lb

## 2024-03-06 DIAGNOSIS — I340 Nonrheumatic mitral (valve) insufficiency: Secondary | ICD-10-CM

## 2024-03-06 DIAGNOSIS — I493 Ventricular premature depolarization: Secondary | ICD-10-CM | POA: Diagnosis not present

## 2024-03-06 DIAGNOSIS — I081 Rheumatic disorders of both mitral and tricuspid valves: Secondary | ICD-10-CM | POA: Diagnosis not present

## 2024-03-06 DIAGNOSIS — I4821 Permanent atrial fibrillation: Secondary | ICD-10-CM

## 2024-03-06 DIAGNOSIS — K7460 Unspecified cirrhosis of liver: Secondary | ICD-10-CM | POA: Insufficient documentation

## 2024-03-06 DIAGNOSIS — I878 Other specified disorders of veins: Secondary | ICD-10-CM | POA: Diagnosis not present

## 2024-03-06 DIAGNOSIS — D539 Nutritional anemia, unspecified: Secondary | ICD-10-CM | POA: Insufficient documentation

## 2024-03-06 DIAGNOSIS — I071 Rheumatic tricuspid insufficiency: Secondary | ICD-10-CM | POA: Diagnosis not present

## 2024-03-06 DIAGNOSIS — R0600 Dyspnea, unspecified: Secondary | ICD-10-CM | POA: Diagnosis not present

## 2024-03-06 DIAGNOSIS — I2510 Atherosclerotic heart disease of native coronary artery without angina pectoris: Secondary | ICD-10-CM | POA: Diagnosis not present

## 2024-03-06 DIAGNOSIS — R55 Syncope and collapse: Secondary | ICD-10-CM | POA: Diagnosis not present

## 2024-03-06 DIAGNOSIS — Q2111 Secundum atrial septal defect: Secondary | ICD-10-CM | POA: Insufficient documentation

## 2024-03-06 DIAGNOSIS — Z85820 Personal history of malignant melanoma of skin: Secondary | ICD-10-CM | POA: Insufficient documentation

## 2024-03-06 DIAGNOSIS — Z7901 Long term (current) use of anticoagulants: Secondary | ICD-10-CM | POA: Diagnosis not present

## 2024-03-06 DIAGNOSIS — I272 Pulmonary hypertension, unspecified: Secondary | ICD-10-CM | POA: Diagnosis not present

## 2024-03-06 DIAGNOSIS — I4891 Unspecified atrial fibrillation: Secondary | ICD-10-CM | POA: Insufficient documentation

## 2024-03-06 DIAGNOSIS — I5032 Chronic diastolic (congestive) heart failure: Secondary | ICD-10-CM | POA: Insufficient documentation

## 2024-03-06 DIAGNOSIS — I517 Cardiomegaly: Secondary | ICD-10-CM | POA: Diagnosis not present

## 2024-03-06 DIAGNOSIS — I4510 Unspecified right bundle-branch block: Secondary | ICD-10-CM | POA: Diagnosis not present

## 2024-03-06 DIAGNOSIS — Z7984 Long term (current) use of oral hypoglycemic drugs: Secondary | ICD-10-CM | POA: Insufficient documentation

## 2024-03-06 DIAGNOSIS — Z79899 Other long term (current) drug therapy: Secondary | ICD-10-CM | POA: Diagnosis not present

## 2024-03-06 DIAGNOSIS — D329 Benign neoplasm of meninges, unspecified: Secondary | ICD-10-CM | POA: Diagnosis not present

## 2024-03-06 DIAGNOSIS — I482 Chronic atrial fibrillation, unspecified: Secondary | ICD-10-CM | POA: Insufficient documentation

## 2024-03-06 MED ORDER — METOPROLOL SUCCINATE ER 25 MG PO TB24
50.0000 mg | ORAL_TABLET | Freq: Two times a day (BID) | ORAL | 6 refills | Status: DC
  Filled 2024-03-06: qty 60, 15d supply, fill #0

## 2024-03-06 NOTE — Patient Instructions (Signed)
 Medication Changes:  DECREASE Metoprolol  XL to 25 mg Twice daily   Special Instructions // Education:  Do the following things EVERYDAY: Weigh yourself in the morning before breakfast. Write it down and keep it in a log. Take your medicines as prescribed Eat low salt foods--Limit salt (sodium) to 2000 mg per day.  Stay as active as you can everyday Limit all fluids for the day to less than 2 liters   Follow-Up in: 2 months with Dr Rolan   At the [facility], you and your health needs are our priority. We have a designated team specialized in the treatment of Heart Failure. This Care Team includes your primary Heart Failure Specialized Cardiologist (physician), Advanced Practice Providers (APPs- Physician Assistants and Nurse Practitioners), and Pharmacist who all work together to provide you with the care you need, when you need it.   You may see any of the following providers on your designated Care Team at your next follow up:  Dr. Toribio Fuel Dr. Ezra Rolan Dr. Ria Commander Dr. Odis Brownie Greig Mosses, NP Caffie Shed, [place] [facility] [place], [place] Beckey Coe, NP [place] Lee, NP Tinnie Redman, PharmD   Please be sure to bring in all your medications bottles to every appointment.   Need to Contact Us :  If you have any questions or concerns before your next appointment please send us  a message through [place] or call our office at [phone].    TO LEAVE A MESSAGE FOR THE NURSE SELECT OPTION 2, PLEASE LEAVE A MESSAGE INCLUDING: YOUR NAME DATE OF BIRTH CALL BACK NUMBER REASON FOR CALL**this is important as we prioritize the call backs  YOU WILL RECEIVE A CALL BACK THE SAME DAY AS LONG AS YOU CALL BEFORE 4:00 PM

## 2024-03-06 NOTE — Progress Notes (Signed)
 ADVANCED HF CLINIC NOTE  PCP: Shepard Ade, MD HF Cardiology: Dr. Rolan  HPI: John Holder is a 89 y.o. with history of chronic atrial fibrillation, diastolic CHF, pulmonary hypertension, [name]. Initially referred by Dr. Alveta for evaluation of CHF and pulmonary hypertension.   Patient was found to be in atrial fibrillation back in 2017.  At that time, he had a cardioversion.  This did not hold, and he has been in atrial fibrillation since 2017 chronically. He has had Fe deficiency anemia requiring Fe infusions, GI workup was unrevealing.  Also of note, he has cirrhosis by 9/21 MRI abdomen, cause uncertain (not a heavy drinker).   In the summer of 2022, his legs began to swell significantly and developed weeping sores.  He developed significant dyspnea.  He was seen in the cardiology clinic and started on Lasix .  He cut back on sodium.  Echo in 7/22 showed EF 60-65%, normal RV, PASP 71 mmHg, severe biatrial enlargement, severe TR, probably moderate MR, IVC dilated. Over time, his symptoms haves improved.   TEE in 9/22 showed severe TR (functional) and severe MR (prolapse/partial P3 flail).  LHC/RHC in 9/22 showed minimal CAD, mildly elevated PCWP.     Patient had failed Mitraclip placed in 10/22, the device only attached to one leaflet.  Patient had redo successful Mitraclip in 11/22 with new clip adjacent to original clip.  Echo in 12/22 showed EF 60-65%, D-shaped septum with moderate RV enlargement and mildly decreased systolic function, PASP 55 mmHg, severe biatrial enlargement, 2 Mitraclips with only 1 attached to both leaflets, mild MR with mean gradient 3 mmHg, severe TR, small iatrogenic secundum ASD, dilated IVC.   Echo in 3/23 showed EF 60-65%, D-shaped septum, mild RV dysfunction with mildly enlarged RV, PASP 50 mmHg, severe biatrial enlargement, mild MR s/p Mitraclip with mean gradient 4.5 mmHg, severe TR.   Patient had tTEER as part of Triluminate study on  02/10/22.  2 clips were placed . Post-procedure echo in 9/23 showed EF 66%, severe RV dilation with low normal systolic function, 2 Mitraclips with mild-moderate MR, 2 Triclips with moderate TR, PASP 63 mmHg.   Echo at [facility] in 10/23 showed LVEF 62%, mild LVH, mild RV dilation with normal systolic function, mild MR with 2 mitraclips and mean gradient 4, moderate TR with 2 triclips, PASP 42 mmHg.  Cardiac MRI in 11/23 showed LV EF 73%, RV EF 53%, mild-moderate TR with regurgitant fraction 20%.   Echo in 10/24 showed LV EF 75%, moderate-severe TR with 2 Triclips, moderate MR with 2 Mitraclips mean gradient 3 mmHg, severe biatrial enlargement, mild RV dilation with mildly decreased RV systolic function.    Onset of diplopia in 10/24.  He had had a prior episode of 6th nerve palsy.  He saw a neurologist, MRI of brain in 11/24 showed no CVA.  Carotid dopplers in 10/24 showed no significant disease. The diplopia has resolved, no definite cause.   Reently admitted for near syncope and bradycardia that self resolved on arrival to ED. MRI brain and CT head/neck negative for CVA. Notable meningioma on MRI. He was discharged and instructed to follow up with cardiology.   Most recent echo 10/2 at [facility] with EF 60% with nl RV, RVSP 50 mmHg, mod/sev residual TR, mild MR.  He returns today for heart failure follow up. Overall feeling well. NYHA I. Denies chest pain, dyspnea, fatigue, palpitations, dizziness, and abnormal bleeding. Able to perform ADLs. Appetite okay. Weight at home  stable. Takes BP 3x/week, normal 120s/80s. Compliant with all medications.  PMH: 1. Pulmonary hypertension: Echo in 7/22 with PASP 71 mmHg.  - V/Q scan (8/22): no evidence for chronic PE.  - PFTs (8/22): Minimal airways obstruction - RHC (9/22) showed pulmonary venous hypertension.  2. Fe deficiency anemia: He had extensive GI evaluation in the past that was unrevealing.  Followed by Dr. Magrinat, has had Fe infusions.  3. Chronic  atrial fibrillation: Diagnosed in 2017, failed DCCV in 2017 and appears to have been in atrial fibrillation since then.  4. Cirrhosis: 9/21 abdominal MRI showed cirrhosis.  No history of heavy ETOH, cause uncertain.  5. Chronic diastolic CHF: Echo (7/22) with EF 60-65%, normal RV, PASP 71 mmHg, severe biatrial enlargement, severe TR, probably moderate MR, IVC dilated.  6. Valvular heart disease: Echo in 7/22 with probably moderate MR, severe TR.  Suspect functional due to AF/dilated atria.  - TEE (9/22): EF 55-60%, RV moderately dilated with normal systolic function, moderate LAE, severe RAE, severe TR (functional) with peak RV-RA gradient 45 mmHg, severe highly eccentric mitral regurgitation with prolapse and partial flail of the P3 segment of the posterior leaflet.   - LHC/RHC (9/22): Nonobstructive mild CAD; mean RA 6, PA 47/15 mean 31, mean PCWP 15 with v-waves to 32, CI 3.32, PVR 2.5 WU.  - Failed Mitraclip in 10/22, successful Mitraclip in 11/22.  - Echo (12/22): EF 60-65%, D-shaped septum with moderate RV enlargement and mildly decreased systolic function, PASP 55 mmHg, severe biatrial enlargement, 2 Mitraclips with only 1 attached to both leaflets, mild MR with mean gradient 3 mmHg, severe TR, small iatrogenic secundum ASD, dilated IVC.  - Echo (3/23): EF 60-65%, D-shaped septum, mild RV dysfunction with mildly enlarged RV, PASP 50 mmHg, severe biatrial enlargement, mild MR s/p Mitraclip with mean gradient 4.5 mmHg, severe TR.  - tTEER as part of Triluminate study in 9/23.  - Post-tTEER echo in 9/23: EF 66%, severe RV dilation with low normal systolic function, 2 Mitraclips with mild-moderate MR, 2 Triclips with moderate TR, PASP 63 mmHg.  - Echo (10/23, [facility]): LVEF 62%, mild LVH, mild RV dilation with normal systolic function, mild MR with 2 mitraclips and mean gradient 4, moderate TR with 2 triclips, PASP 42 mmHg.   - Cardiac MRI (11/23, [facility]): LV EF 73%, RV EF 53%, mild-moderate TR with  regurgitant fraction 20%.  - Echo (10/24): LV EF 75%, moderate-severe TR with 2 Triclips, moderate MR with 2 Mitraclips mean gradient 3 mmHg, severe biatrial enlargement, mild RV dilation with mildly decreased RV systolic function.   7. Left renal mass: S/p left nephrectomy.  8. T-spine compression fracture.  9. Melanoma 10. H/o 6th nerve palsy: Episode around 2016.  Recurrent episode in 10/24, resolved after a couple of weeks.  - MRI 11/24: No evidence for CVA.  - Carotid dopplers (10/24): No significant disease.   SH: Widower, occasional ETOH (not heavy), nonsmoker.  Retired Clinical research associate.  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  [employer] graduate.   Family History  Problem Relation Age of Onset   Breast cancer Mother 23   Hypertension Father    Diabetes Neg Hx    Stroke Neg Hx    CAD Neg Hx    Colon cancer Neg Hx    Colon polyps Neg Hx    Esophageal cancer Neg Hx    Rectal cancer Neg Hx    Stomach cancer Neg Hx    ROS: All systems reviewed  and negative except as per HPI.   Current Outpatient Medications  Medication Sig Dispense Refill   ALPRAZolam  (XANAX ) 0.25 MG tablet Take 1 tablet (0.25 mg total) by mouth every 6 (six) hours as needed for anxiety 30 tablet 0   apixaban  (ELIQUIS ) 5 MG TABS tablet Take 1 tablet (5 mg total) by mouth 2 (two) times daily. 180 tablet 3   Apoaequorin (PREVAGEN EXTRA STRENGTH PO) Take 1 tablet by mouth daily.     Barberry-Oreg Grape-Goldenseal (BERBERINE COMPLEX PO) Take 2,400 mg by mouth daily.     Cholecalciferol 50 MCG (2000 UT) CAPS take one capsule Orally daily     Chromium 1000 MCG TABS Take 1,000 mcg by mouth daily. chromium picolinate     Coenzyme Q10 (CO Q10) 200 MG CAPS Take 200 mg by mouth daily.     empagliflozin  (JARDIANCE ) 10 MG TABS tablet Take 1 tablet (10 mg total) by mouth daily before breakfast. 30 tablet 11   ferrous gluconate  (FERGON) 324 MG tablet Take 324 mg by mouth 3 (three) times a week.     furosemide  (LASIX ) 20  MG tablet Take 1 tablet (20 mg total) by mouth daily. 90 tablet 3   losartan  (COZAAR ) 25 MG tablet Take 1 tablet (25 mg total) by mouth daily. 90 tablet 3   MAGNESIUM  GLYCINATE PO Take 525 mg by mouth in the morning and at bedtime.     Melatonin 10 MG TABS Take 10 mg by mouth at bedtime.     Misc Natural Products (ADVANCED JOINT RELIEF PO) Take 1 tablet by mouth 2 (two) times daily.     Multiple Vitamin (MULTIVITAMIN) capsule Take 1 capsule by mouth daily. Mega men 50 plus     Multiple Vitamins-Minerals (ZINC PO) Take by mouth daily.     NON FORMULARY Take 300 mg by mouth 2 (two) times daily. TRU vitamin  Nigen     omeprazole  (PRILOSEC) 20 MG capsule Take 1 capsule (20 mg total) by mouth daily. 90 capsule 3   OVER THE COUNTER MEDICATION Take 2 capsules by mouth daily. Prostacare     OVER THE COUNTER MEDICATION Take 1 capsule by mouth 2 (two) times daily. Uricare Himalaya     OVER THE COUNTER MEDICATION Take 1 tablet by mouth 2 (two) times daily. Liver care     OVER THE COUNTER MEDICATION Take 1 tablet by mouth 2 (two) times daily. Tri Flex     OVER THE COUNTER MEDICATION Take 1 capsule by mouth 2 (two) times daily. conjugated linoleic acid     OVER THE COUNTER MEDICATION Take 1 tablet by mouth daily in the afternoon. CDB Gummies     OVER THE COUNTER MEDICATION Take 1 capsule by mouth daily in the afternoon. Hemp oil gummies     OVER THE COUNTER MEDICATION Take 1 tablet by mouth daily. arthrozene     Polyethyl Glycol-Propyl Glycol (SYSTANE) 0.4-0.3 % SOLN 1 drop daily as needed (as needed for dry eye).     Sodium Hyaluronate, oral, (HYALURONIC ACID) 100 MG CAPS Take 100 mg by mouth 2 (two) times daily.     spironolactone  (ALDACTONE ) 25 MG tablet Take 1 tablet (25 mg total) by mouth daily. 90 tablet 3   tadalafil (CIALIS) 20 MG tablet TAKE ONE TABLET BY MOUTH DAILY AS NEEDED 30 tablet 11   metoprolol  succinate (TOPROL -XL) 25 MG 24 hr tablet Take 2 tablets (50 mg total) by mouth 2 (two) times  daily. 60 tablet 6   No current  facility-administered medications for this encounter.   Blood pressure 110/62, pulse 67, height 5' 11 (1.803 m), weight 73.5 kg (162 lb), SpO2 97%.  Filed Weights   03/06/24 1119  Weight: 73.5 kg (162 lb)   Physical Exam: General: Well appearing. No distress on RA Cardiac: JVP flat. S1 and S2 present. No murmurs. Extremities: Warm and dry.  No peripheral edema.  Neuro: Alert and oriented x3. Affect pleasant. Moves all extremities without difficulty.  ECG (personally reviewed): AF 74 bpm, RBBB, QRS  Assessment/Plan: 1. Chronic diastolic CHF: Echo in 7/22 with EF 60-65%, normal RV, PASP 71 mmHg, severe biatrial enlargement, severe TR, probably moderate MR, IVC dilated.  In the summer of 2022, he developed symptomatic CHF, this has improved with diuresis and sodium restriction.  TEE showed severe functional TR and severe primary mitral regurgitation with partial flail posterior leaflet (P3).  I suspect that CHF is due to chronic atrial fibrillation + valvular disease (MR, TR).  He is now s/p Mitraclip, last echo in 12/22 showed EF 60-65%, D-shaped septum with moderate RV enlargement and mildly decreased systolic function, PASP 55 mmHg, severe biatrial enlargement, 2 Mitraclips with only 1 attached to both leaflets, mild MR with mean gradient 3 mmHg, severe TR, small iatrogenic secundum ASD, dilated IVC. Echo in 3/23 showed EF 60-65%, D-shaped septum, mild RV dysfunction with mildly enlarged RV, PASP 50 mmHg, severe biatrial enlargement, mild MR s/p Mitraclip with mean gradient 4.5 mmHg, severe TR.  Patient then had tTEER in 9/23. Post-op echo showed EF 66%, severe RV dilation with low normal systolic function, 2 Mitraclips with mild-moderate MR, 2 Triclips with moderate TR, PASP 63 mmHg.  Echo at [facility] in 10/23 showed LVEF 62%, mild LVH, mild RV dilation with normal systolic function, mild MR with 2 mitraclips and mean gradient 4, moderate TR with 2 triclips, PASP  42 mmHg.  Cardiac MRI in 11/23 showed LV EF 73%, RV EF 53%, mild-moderate TR with regurgitant fraction 20%. Echo in 10/24 showed LV EF 75%, moderate-severe TR with 2 Triclips, moderate MR with 2 Mitraclips mean gradient 3 mmHg, severe biatrial enlargement, mild RV dilation with mildly decreased RV systolic function.  Most recent echo 10/25 with EF 60%, nl RV, mod/sev TR  (2 clips), mod to severe MR w 2 clips. - NYHA class I-II, euvolemic on exam.  - Continue Lasix  20 mg daily.  BMET/BNP today.  - Continue empagliflozin  10 mg daily.    - Continue spironolactone  25 mg daily.  2. Pulmonary hypertension: Pulmonary venous hypertension by 9/22 RHC.  PASP was 63 on post-tTEER echo and 42 mmHg on 10/23 echo. Echo 10/25 with RVSP 50 mmHg. This is improving, but he could have some residual PH due to pulmonary vascular remodeling in the setting of long-standing mitral valvular disease.  3. Atrial fibrillation: This is chronic and likely permanent (present since 2017).  The atria are severely dilated on echo. I do not think that he would be likely to hold NSR with DCCV, even with anti-arrhythmic.  - Continue Eliquis .  - With bradycardic event, decrease toprol  XL to 25 mg bid 4. Valvular heart disease: He is s/p Mitraclip with 3/23 echo showing mild MR and mild MS, but he still had severe TR.  Symptomatically improved s/p Mitraclip.  With severe TR and RV changes already noted on echo (D-shaped septum, mild RV enlargement and hypokinesis), I was concerned that he could develop progressive RV failure over time. He then had tTEER with 2 Triclips in 9/23  as part of Triluminate trial.  Echo in 10/24 showed moderate MR s/p 2 Mitraclips and somewhat worse TR (moderate-severe) with 2 Triclips. Most recent echo 10/25 mild MR with mod/sev TR.   Followup in 6 months with Dr. McLean   [place] Darrin Apodaca, NP 03/06/2024

## 2024-03-11 NOTE — Progress Notes (Unsigned)
 Cardiology Office Note:    Date:  03/14/2024   ID:  TRELYN VANDERLINDE, DOB 09/06/1931, MRN [id number]  PCP:  Shepard Ade, MD  Cardiologist:  None     Referring MD: Shepard Ade, MD   Chief Complaint: hospital follow-up of near syncope and bradycardia  History of Present Illness:    John Holder is a 89 y.o. male with a history of  minimal non-obstructive CAD on cardiac catheterization in 7/022, chronic HFpEF, chronic atrial fibrillation on Eliquis , severe MR s/p failed Mitraclip in 02/2021 and then redo MitraClip in 03/2021, severe tricuspid regurgitation s/p tTEER with 2 Triclpips in 01/2022 as part of the Triluminate study, pulmonary venous hypertension, systemic hypertension, cirrhosis, GERD,  iron  deficiency anemia, and brain meningioma noted on brain MRI in 01/2024 who is followed by Dr. Rolan and presents today for  hospital follow-up of near syncope and bradycardia.   Patient is followed by Cardiology primarily for chronic HFpEF, valvular disease, and atrial fibrillation which he has been in consistently since 2017 and is now considered permanent. He has known mitral and tricuspid regurgitation which have progressed over the years requiring surgery. He underwent MItraClip in 10/22 which failed as device only attached to one leaflet. He then had a redo MitraClip in 03/2021 which was successful. R/ LHC prior to surgery in 01/2021 showed minimal non-obstructive CAD and mildly elevated PCWP.  He then underwent tTEER with placement of 2 Triclips in 01/2022 at [facility] as part of the Triluminate study.   He also has severe pulmonary hypertension for which he has had a thorough work-up for. V/Q scan in 12/2020 was negative for chronic PE. PFTs in 12/2020 shoed minimal airway obstruction. RHC showed pulmonary venous hypertension.   He was recently admitted in 01/2024 for near syncope. Upon arrival to the ED he was noted to have a brief episode of bradycardia with  rates in the 30s. Brain MRI showed a punctate acute infarct vs artifact within the right aspect of the pons (deemed artifact by Neurology), 4mm meningoma overying the anterior right frontal lobe, and possible additional small meningioma within the posterior fossa on the left.  Head/ neck CTA showed no large vessel occlusion. He was seen by Cardiology and it was felt that he likely had vagally mediated bradycardia and dizziness while getting getting a cast removed from his wrist. He was felt to be stable for discharge. An outpatient monitor was ordered.   He was seen by Cardiology at [facility] on 02/16/2024. Echo that day showed LVEF of 60%, mildly dilated RV with normal RV function, moderate to severe residual TR s/p edge to edge repair, mild residual MR s/p edge to edge repair, and severe pulmonary hypertension with RVSP of 50 mmHg. Mitral and tricuspid valve were overall felt to be stable from last year.   He was last seen in the [facility] on 03/06/2024 at which time he was doing well with NYHA class I symptoms. Toprol -XL was decreased due to recent brady event.   Patient presents today to go over recent monitor results. Formal read is not back yet but preliminary read shows continuous atrial fibrillation with average heart rate of 77bpm (min 37, max 145). Episodes of bradycardia with rates in the 30s occurred around 4am. There was also 2 short runs of NSVT (longest run 4 beats). However, there were no significant tachy or brady arrhythmias.   He is doing very well today and denies any cardiac symptoms. No  recurrent near syncope or lightheadedness/ dizziness. He remains very active and plays tennis twice a week. He has had 2 falls while playing tennis this year (one in 06/2023 and one in 01/2024 when he broke his left wrist). He denies any symptoms prior to fall and thinks he lost his balance. He recently started doing Tai Chi and he thinks this has helped.   ROS: No chest pain, shortness of breath,  orthopnea, PND, lower extremity edema, palpitations, lightheadedness, dizziness, syncope.    EKGs/Labs/Other Studies Reviewed:    The following studies were reviewed:  Right/ Left Cardiac Catheterization 01/27/2021:   Prox LAD lesion is 25% stenosed.   1. Minimal CAD.  2. Borderline elevated PCWP with prominent V-waves suggestive of significant mitral regurgitation.  3. Mild-moderate pulmonary venous hypertension.  4. Preserved cardiac output.   Diagnostic Dominance: Right  _______________  Echocardiogram 02/16/2024 ([facility]): The left ventricular systolic function is normal with LVEF of 60% (+/-5%) by Simpson's biplane.  The right ventricular size is mildly dilated by visual assessment. Right ventricular systolic function is normal by visual assessment.  The right ventricular systolic pressure is 50 mmHg assuming a right atrial pressure of 8.0 mmHg which is consistent with severe pulmonary hypertension.  The tricuspid valve is status post edge to edge repair. There are 2 XTW clips to the tricuspid valve with leaflet insertion suboptimally characterized. There is moderate to severe residual tricuspid regurgitation s/p edge to edge repair. The mean diastolic gradient is 2 mmHg. HR   74 bpm.  The mitral valve is s/p transcatheter edge to edge repair. There is mild residual mitral regurgitation s/p mitral edge to edge repair. Mean diastolic gradient 3 mmHg.  Compared to prior TTE performed on 02/17/2023, there is no significant change.    EKG:  EKG ordered today.   EKG Interpretation Date/Time:  Wednesday March 14 2024 14:19:54 EDT Ventricular Rate:  64 PR Interval:    QRS Duration:  120 QT Interval:  414 QTC Calculation: 427 R Axis:   39  Text Interpretation: Atrial fibrillation Non-specific intra-ventricular conduction delay When compared with ECG of 06-Mar-2024 12:04, No significant changes compared to prior tracing Confirmed by Tyerra Loretto [phone]) on 03/14/2024 2:24:50  PM    Recent Labs: 02/08/2024: ALT 19; B Natriuretic Peptide 271.5; BUN 24; Creatinine, Ser 1.06; Hemoglobin 15.1; Magnesium  2.1; Platelets 135; Potassium 3.9; Sodium 136; TSH 1.679  Recent Lipid Panel    Component Value Date/Time   CHOL 174 06/16/2023 0935   TRIG 57 06/16/2023 0935   HDL 54 06/16/2023 0935   CHOLHDL 3.2 06/16/2023 0935   VLDL 11 06/16/2023 0935   LDLCALC 109 (H) 06/16/2023 0935    Physical Exam:    Vital Signs: BP 100/68   Pulse (!) 33   Ht 5' 11 (1.803 m)   Wt 164 lb (74.4 kg)   SpO2 93%   BMI 22.87 kg/m     Wt Readings from Last 3 Encounters:  03/14/24 164 lb (74.4 kg)  03/06/24 162 lb (73.5 kg)  02/08/24 154 lb (69.9 kg)     General: 89 y.o. male in no acute distress. HEENT: Normocephalic and atraumatic. Sclera clear.  Neck: Supple. No carotid bruits. No JVD. Heart: Irregularly irregular rhythm with normal rate. Possible faint murmur. No gallops or rubs. Lungs: No increased work of breathing. Clear to ausculation bilaterally. No wheezes, rhonchi, or rales.  Extremities: No lower extremity edema.  Skin: Warm and dry. Neuro: No focal deficits. Psych: Normal affect. Responds appropriately.  Assessment:    1. Near syncope   2. Bradycardia   3. Chronic heart failure with preserved ejection fraction (HFpEF) (HCC)   4. Pulmonary venous hypertension as complication of procedure   5. Minimal non-obstructive CAD   6. Permanent atrial fibrillation (HCC)   7. Severe mitral regurgitation s/p MitraClip   8. Severe tricuspid regurgitation s/p TriClip   9. Primary hypertension     Plan:    Near Syncope  Bradycardia Patient was admitted in 01/2024 for an episode of near syncope and was noted to have a brief episode of bradycardia with rates in the 30s in the ED. He was seen by Cardiology and it was felt that he likely had vagally mediated bradycardia and dizziness while getting getting a cast removed from his wrist. Outpatient monitor was ordered.  Formal results still pending. Preliminary results show: Continuous atrial fibrillation with average heart rate of 77bpm (min 37, max 145). Episodes of bradycardia with rates in the 30s occurred around 4am. There was also 2 short runs of NSVT (longest run 4 beats). However, there were no significant tachy or brady arrhythmias.  - Toprol -XL was decreased at visit in the [facility] last week. Continue lower dose of Toprol -XL 25mg  twice daily.  - No recurrent near syncope or syncope.   Chronic HFpEF Pulmonary Venous Hypertension Last Echo on 02/16/2024 showed LVEF of 60%, mildly dilated RV with normal RV function, moderate to severe residual TR s/p edge to edge repair, mild residual MR s/p edge to edge repair, and severe pulmonary hypertension with RVSP of 50 mmHg.  - Euvolemic on exam.  - Continue Lasix  20mg  daily.  - Continue Spironolactone  25mg  daily.  - Continue Jardiance  10mg  daily.   Minimal Non-Obstructive CAD Noted on cardiac catheterization in 11/2020 prior to valve surgery. - No chest pain.  - No Aspirin  due to need for full anticoagulation with DOAC.  Permanent Atrial Fibrillation Initially diagnosed in 2017. S/p DCCV at that time but it was unsuccessful but he has been in atrial fibrillation since that time.  - Rate controlled.  - Continue Toprol -XL 25mg  twice daily. - Continue Eliquis  5mg  twice daily.   Severe Mitral Regurgitation s/p MitraClip Severe Tricuspid Regurgitation s/p TriClip S/p failed Mitraclip in 02/2021 and redo MitraClip in 03/2021 and the tTEER with 2 Triclpips in 01/2022 as part of the Triluminate study. Last Echo on 02/16/2024 at [facility] showed moderate to severe residual TR s/p edge to edge repair, mild residual MR s/p edge to edge repair, and severe pulmonary hypertension with RVSP of 50 mmHg. Mitral and tricuspid valve were overall felt to be stable from last year.  - Continue SBE prophylaxis prior to dental procedures.  - [facility].   Hypertension BP well controlled.  - Continue current medications: Losartan  25mg  daily, Spironolactone  25mg  daily, and Toprol -XL 25mg  twice daily.  Disposition: Patient is followed by Dr. Rolan. He can follow up with [facility] team as directed and can follow up with General Cardiology as needed.    Signed, Aline FORBES Door, PA-C  03/15/2024 9:33 AM     [facility]

## 2024-03-14 ENCOUNTER — Other Ambulatory Visit (HOSPITAL_COMMUNITY): Payer: Self-pay

## 2024-03-14 ENCOUNTER — Other Ambulatory Visit (HOSPITAL_COMMUNITY): Payer: Self-pay | Admitting: Cardiology

## 2024-03-14 ENCOUNTER — Encounter: Payer: Self-pay | Admitting: Student

## 2024-03-14 ENCOUNTER — Ambulatory Visit: Attending: Cardiology | Admitting: Student

## 2024-03-14 VITALS — BP 100/68 | HR 64 | Ht 71.0 in | Wt 164.0 lb

## 2024-03-14 DIAGNOSIS — I340 Nonrheumatic mitral (valve) insufficiency: Secondary | ICD-10-CM | POA: Diagnosis not present

## 2024-03-14 DIAGNOSIS — R55 Syncope and collapse: Secondary | ICD-10-CM | POA: Diagnosis not present

## 2024-03-14 DIAGNOSIS — I10 Essential (primary) hypertension: Secondary | ICD-10-CM

## 2024-03-14 DIAGNOSIS — I071 Rheumatic tricuspid insufficiency: Secondary | ICD-10-CM

## 2024-03-14 DIAGNOSIS — I4821 Permanent atrial fibrillation: Secondary | ICD-10-CM

## 2024-03-14 DIAGNOSIS — R001 Bradycardia, unspecified: Secondary | ICD-10-CM | POA: Diagnosis not present

## 2024-03-14 DIAGNOSIS — I2510 Atherosclerotic heart disease of native coronary artery without angina pectoris: Secondary | ICD-10-CM

## 2024-03-14 DIAGNOSIS — I5032 Chronic diastolic (congestive) heart failure: Secondary | ICD-10-CM | POA: Diagnosis not present

## 2024-03-14 DIAGNOSIS — I973 Postprocedural hypertension: Secondary | ICD-10-CM | POA: Diagnosis not present

## 2024-03-14 MED ORDER — METOPROLOL SUCCINATE ER 25 MG PO TB24
25.0000 mg | ORAL_TABLET | Freq: Two times a day (BID) | ORAL | 1 refills | Status: AC
  Filled 2024-03-14 – 2024-04-10 (×2): qty 180, 90d supply, fill #0

## 2024-03-14 NOTE — Patient Instructions (Signed)
 Thank you for choosing [facility]!     Medication Instructions:  Your medication list has been updated. *If you need a refill on your cardiac medications before your next appointment, please call your pharmacy*   Lab Work: No labs were ordered during today's visit.  If you have labs (blood work) drawn today and your tests are completely normal, you will receive your results only by: MyChart Message (if you have MyChart) OR A paper copy in the mail If you have any lab test that is abnormal or we need to change your treatment, we will call you to review the results.   Testing/Procedures: No procedures were ordered during today's visit.     Follow-Up: At [facility], you and your health needs are our priority.  As part of our continuing mission to provide you with exceptional heart care, we have created designated Provider Care Teams.  These Care Teams include your primary Cardiologist (physician) and Advanced Practice Providers (APPs -  Physician Assistants and Nurse Practitioners) who all work together to provide you with the care you need, when you need it. We recommend signing up for the patient portal called MyChart.  Sign up information is provided on this After Visit Summary.  MyChart is used to connect with patients for Virtual Visits (Telemedicine).  Patients are able to view lab/test results, encounter notes, upcoming appointments, etc.  Non-urgent messages can be sent to your provider as well.   To learn more about what you can do with MyChart, go to [email or web address].

## 2024-03-15 ENCOUNTER — Encounter: Payer: Self-pay | Admitting: Student

## 2024-03-15 ENCOUNTER — Other Ambulatory Visit (HOSPITAL_COMMUNITY): Payer: Self-pay

## 2024-03-15 MED ORDER — EMPAGLIFLOZIN 10 MG PO TABS
10.0000 mg | ORAL_TABLET | Freq: Every day | ORAL | 11 refills | Status: AC
  Filled 2024-03-15: qty 30, 30d supply, fill #0
  Filled 2024-04-10: qty 30, 30d supply, fill #1
  Filled 2024-05-08: qty 30, 30d supply, fill #2
  Filled 2024-06-11: qty 30, 30d supply, fill #3

## 2024-03-15 MED ORDER — SPIRONOLACTONE 25 MG PO TABS
25.0000 mg | ORAL_TABLET | Freq: Every day | ORAL | 3 refills | Status: AC
  Filled 2024-03-15: qty 90, 90d supply, fill #0

## 2024-03-15 MED ORDER — FUROSEMIDE 20 MG PO TABS
20.0000 mg | ORAL_TABLET | Freq: Every day | ORAL | 3 refills | Status: AC
  Filled 2024-03-15: qty 90, 90d supply, fill #0
  Filled 2024-06-11: qty 90, 90d supply, fill #1

## 2024-03-16 ENCOUNTER — Other Ambulatory Visit (HOSPITAL_COMMUNITY): Payer: Self-pay

## 2024-03-16 DIAGNOSIS — R55 Syncope and collapse: Secondary | ICD-10-CM

## 2024-03-16 MED ORDER — ALPRAZOLAM 0.25 MG PO TABS
0.2500 mg | ORAL_TABLET | Freq: Four times a day (QID) | ORAL | 0 refills | Status: AC | PRN
  Filled 2024-03-16: qty 30, 8d supply, fill #0

## 2024-03-18 ENCOUNTER — Ambulatory Visit: Payer: Self-pay | Admitting: Cardiology

## 2024-03-19 ENCOUNTER — Encounter (HOSPITAL_COMMUNITY): Payer: Self-pay

## 2024-03-19 ENCOUNTER — Other Ambulatory Visit (HOSPITAL_COMMUNITY): Payer: Self-pay

## 2024-03-21 DIAGNOSIS — E785 Hyperlipidemia, unspecified: Secondary | ICD-10-CM | POA: Diagnosis not present

## 2024-03-21 DIAGNOSIS — D508 Other iron deficiency anemias: Secondary | ICD-10-CM | POA: Diagnosis not present

## 2024-03-21 DIAGNOSIS — I509 Heart failure, unspecified: Secondary | ICD-10-CM | POA: Diagnosis not present

## 2024-03-21 DIAGNOSIS — I110 Hypertensive heart disease with heart failure: Secondary | ICD-10-CM | POA: Diagnosis not present

## 2024-03-26 DIAGNOSIS — D696 Thrombocytopenia, unspecified: Secondary | ICD-10-CM | POA: Diagnosis not present

## 2024-03-26 DIAGNOSIS — I10 Essential (primary) hypertension: Secondary | ICD-10-CM | POA: Diagnosis not present

## 2024-03-26 DIAGNOSIS — R7301 Impaired fasting glucose: Secondary | ICD-10-CM | POA: Diagnosis not present

## 2024-03-26 DIAGNOSIS — D72819 Decreased white blood cell count, unspecified: Secondary | ICD-10-CM | POA: Diagnosis not present

## 2024-03-26 DIAGNOSIS — E785 Hyperlipidemia, unspecified: Secondary | ICD-10-CM | POA: Diagnosis not present

## 2024-03-26 DIAGNOSIS — I482 Chronic atrial fibrillation, unspecified: Secondary | ICD-10-CM | POA: Diagnosis not present

## 2024-03-26 DIAGNOSIS — Z1331 Encounter for screening for depression: Secondary | ICD-10-CM | POA: Diagnosis not present

## 2024-03-26 DIAGNOSIS — I509 Heart failure, unspecified: Secondary | ICD-10-CM | POA: Diagnosis not present

## 2024-03-26 DIAGNOSIS — I110 Hypertensive heart disease with heart failure: Secondary | ICD-10-CM | POA: Diagnosis not present

## 2024-03-26 DIAGNOSIS — Z0000 Encounter for general adult medical examination without abnormal findings: Secondary | ICD-10-CM | POA: Diagnosis not present

## 2024-03-26 DIAGNOSIS — Z1339 Encounter for screening examination for other mental health and behavioral disorders: Secondary | ICD-10-CM | POA: Diagnosis not present

## 2024-03-26 DIAGNOSIS — R82998 Other abnormal findings in urine: Secondary | ICD-10-CM | POA: Diagnosis not present

## 2024-03-26 DIAGNOSIS — D6859 Other primary thrombophilia: Secondary | ICD-10-CM | POA: Diagnosis not present

## 2024-04-03 ENCOUNTER — Other Ambulatory Visit (HOSPITAL_COMMUNITY): Payer: Self-pay | Admitting: Cardiology

## 2024-04-03 ENCOUNTER — Other Ambulatory Visit (HOSPITAL_COMMUNITY): Payer: Self-pay

## 2024-04-03 MED ORDER — APIXABAN 5 MG PO TABS
5.0000 mg | ORAL_TABLET | Freq: Two times a day (BID) | ORAL | 3 refills | Status: AC
  Filled 2024-04-03: qty 180, 90d supply, fill #0

## 2024-04-10 ENCOUNTER — Other Ambulatory Visit (HOSPITAL_COMMUNITY): Payer: Self-pay

## 2024-04-16 ENCOUNTER — Other Ambulatory Visit (HOSPITAL_COMMUNITY): Payer: Self-pay

## 2024-04-17 DIAGNOSIS — H532 Diplopia: Secondary | ICD-10-CM | POA: Diagnosis not present

## 2024-04-17 DIAGNOSIS — M25532 Pain in left wrist: Secondary | ICD-10-CM | POA: Diagnosis not present

## 2024-04-17 DIAGNOSIS — H47021 Hemorrhage in optic nerve sheath, right eye: Secondary | ICD-10-CM | POA: Diagnosis not present

## 2024-04-17 DIAGNOSIS — Z961 Presence of intraocular lens: Secondary | ICD-10-CM | POA: Diagnosis not present

## 2024-04-23 ENCOUNTER — Telehealth (HOSPITAL_COMMUNITY): Payer: Self-pay | Admitting: Cardiology

## 2024-04-23 NOTE — Telephone Encounter (Signed)
 Called to confirm/remind patient of their appointment at the [facility] on 04/23/24.   Appointment:   [x] Confirmed  [] Left mess   [] No answer/No voice mail  [] VM Full/unable to leave message  [] Phone not in service  Patient reminded to bring all medications and/or complete list.  Confirmed patient has transportation. Gave directions, instructed to utilize valet parking.

## 2024-04-24 ENCOUNTER — Ambulatory Visit (HOSPITAL_COMMUNITY)
Admission: RE | Admit: 2024-04-24 | Discharge: 2024-04-24 | Disposition: A | Source: Ambulatory Visit | Attending: Cardiology | Admitting: Cardiology

## 2024-04-24 ENCOUNTER — Ambulatory Visit (HOSPITAL_COMMUNITY): Payer: Self-pay | Admitting: Cardiology

## 2024-04-24 VITALS — BP 120/86 | HR 68 | Wt 163.0 lb

## 2024-04-24 DIAGNOSIS — I5032 Chronic diastolic (congestive) heart failure: Secondary | ICD-10-CM

## 2024-04-24 LAB — BASIC METABOLIC PANEL WITH GFR
Anion gap: 11 (ref 5–15)
BUN: 24 mg/dL — ABNORMAL HIGH (ref 8–23)
CO2: 25 mmol/L (ref 22–32)
Calcium: 9.1 mg/dL (ref 8.9–10.3)
Chloride: 102 mmol/L (ref 98–111)
Creatinine, Ser: 1.19 mg/dL (ref 0.61–1.24)
GFR, Estimated: 58 mL/min — ABNORMAL LOW (ref >60)
Glucose, Bld: 97 mg/dL (ref 70–99)
Potassium: 4.4 mmol/L (ref 3.5–5.1)
Sodium: 138 mmol/L (ref 135–145)

## 2024-04-24 LAB — LIPID PANEL
Cholesterol: 186 mg/dL (ref 0–200)
HDL: 53 mg/dL (ref >40)
LDL Cholesterol: 117 mg/dL — ABNORMAL HIGH (ref 0–99)
Total CHOL/HDL Ratio: 3.5 ratio
Triglycerides: 78 mg/dL (ref <150)
VLDL: 16 mg/dL (ref 0–40)

## 2024-04-24 LAB — BRAIN NATRIURETIC PEPTIDE: B Natriuretic Peptide: 239.7 pg/mL — ABNORMAL HIGH (ref 0.0–100.0)

## 2024-04-24 NOTE — Progress Notes (Signed)
 ADVANCED HF CLINIC NOTE  PCP: Shepard Ade, MD HF Cardiology: Dr. Rolan  Chief complaint: CHF  HPI: John Holder is a 89 y.o. with history of chronic atrial fibrillation, diastolic CHF, pulmonary hypertension, [name]. Initially referred by Dr. Alveta for evaluation of CHF and pulmonary hypertension.   Patient was found to be in atrial fibrillation back in 2017.  At that time, John Holder had a cardioversion.  This did not hold, and John Holder has been in atrial fibrillation since 2017 chronically. John Holder has had Fe deficiency anemia requiring Fe infusions, GI workup was unrevealing.  Also of note, John Holder has cirrhosis by 9/21 MRI abdomen, cause uncertain (not a heavy drinker).   In the summer of 2022, John Holder legs began to swell significantly and developed weeping sores.  John Holder developed significant dyspnea.  John Holder was seen in the cardiology clinic and started on Lasix .  John Holder cut back on sodium.  Echo in 7/22 showed EF 60-65%, normal RV, PASP 71 mmHg, severe biatrial enlargement, severe TR, probably moderate MR, IVC dilated. Over time, John Holder symptoms haves improved.   TEE in 9/22 showed severe TR (functional) and severe MR (prolapse/partial P3 flail).  LHC/RHC in 9/22 showed minimal CAD, mildly elevated PCWP.     Patient had failed Mitraclip placed in 10/22, the device only attached to one leaflet.  Patient had redo successful Mitraclip in 11/22 with new clip adjacent to original clip.  Echo in 12/22 showed EF 60-65%, D-shaped septum with moderate RV enlargement and mildly decreased systolic function, PASP 55 mmHg, severe biatrial enlargement, 2 Mitraclips with only 1 attached to both leaflets, mild MR with mean gradient 3 mmHg, severe TR, small iatrogenic secundum ASD, dilated IVC.   Echo in 3/23 showed EF 60-65%, D-shaped septum, mild RV dysfunction with mildly enlarged RV, PASP 50 mmHg, severe biatrial enlargement, mild MR s/p Mitraclip with mean gradient 4.5 mmHg, severe TR.   Patient had tTEER as part of  Triluminate study on 02/10/22.  2 clips were placed . Post-procedure echo in 9/23 showed EF 66%, severe RV dilation with low normal systolic function, 2 Mitraclips with mild-moderate MR, 2 Triclips with moderate TR, PASP 63 mmHg.   Echo at [facility] in 10/23 showed LVEF 62%, mild LVH, mild RV dilation with normal systolic function, mild MR with 2 mitraclips and mean gradient 4, moderate TR with 2 triclips, PASP 42 mmHg.  Cardiac MRI in 11/23 showed LV EF 73%, RV EF 53%, mild-moderate TR with regurgitant fraction 20%.   Echo in 10/24 showed LV EF 75%, moderate-severe TR with 2 Triclips, moderate MR with 2 Mitraclips mean gradient 3 mmHg, severe biatrial enlargement, mild RV dilation with mildly decreased RV systolic function.    Onset of diplopia in 10/24.  John Holder had had a prior episode of 6th nerve palsy.  John Holder saw a neurologist, MRI of brain in 11/24 showed no CVA.  Carotid dopplers in 10/24 showed no significant disease. The diplopia has resolved, no definite cause.   Admitted in 9/25 for near syncope and bradycardia that self resolved on arrival to ED. MRI brain and CT head/neck negative for CVA. Notable meningioma on MRI.  Episode thought to be vagally-mediated after cast was removed from John Holder wrist (had had a mechanical fall playing tennis and broke John Holder wrist).   John Holder was discharged and instructed to follow up with cardiology.  Zio monitor in 10/25 showed 100% atrial fibrillation, average rate 77, HR down to 37 during sleep. Toprol  XL was decreased to 25  mg bid.   Most recent echo 10/25 at [facility] with EF 60% with mild RV dilation/normal RV systolic function, RVSP 50 mmHg, mod-severe residual TR s/p tTEER, mild MR s/p mTEER.  John Holder returns today for heart failure follow up. John Holder is doing well.  John Holder plays tennis twice a week, feels like John Holder balance is not as good as it was in the past. John Holder has started Tai Chi to help with John Holder balance. John Holder walks 1 mile 2-3 times/week.  No significant exertional dyspnea or chest pain.  No  further episodes of lightheadedness or syncope.  No falls. Weight is stable.   ECG (personally reviewed): atrial fibrillation, iRBBB  Labs (9/25): BNP 272, TSH normal, K 3.9, creatinine 1.06.   PMH: 1. Pulmonary hypertension: Echo in 7/22 with PASP 71 mmHg.  - V/Q scan (8/22): no evidence for chronic PE.  - PFTs (8/22): Minimal airways obstruction - RHC (9/22) showed pulmonary venous hypertension.  2. Fe deficiency anemia: John Holder had extensive GI evaluation in the past that was unrevealing.  Followed by Dr. Magrinat, has had Fe infusions.  3. Chronic atrial fibrillation: Diagnosed in 2017, failed DCCV in 2017 and appears to have been in atrial fibrillation since then.  4. Cirrhosis: 9/21 abdominal MRI showed cirrhosis.  No history of heavy ETOH, cause uncertain.  5. Chronic diastolic CHF: Echo (7/22) with EF 60-65%, normal RV, PASP 71 mmHg, severe biatrial enlargement, severe TR, probably moderate MR, IVC dilated.  6. Valvular heart disease: Echo in 7/22 with probably moderate MR, severe TR.  Suspect functional due to AF/dilated atria.  - TEE (9/22): EF 55-60%, RV moderately dilated with normal systolic function, moderate LAE, severe RAE, severe TR (functional) with peak RV-RA gradient 45 mmHg, severe highly eccentric mitral regurgitation with prolapse and partial flail of the P3 segment of the posterior leaflet.   - LHC/RHC (9/22): Nonobstructive mild CAD; mean RA 6, PA 47/15 mean 31, mean PCWP 15 with v-waves to 32, CI 3.32, PVR 2.5 WU.  - Failed Mitraclip in 10/22, successful Mitraclip in 11/22.  - Echo (12/22): EF 60-65%, D-shaped septum with moderate RV enlargement and mildly decreased systolic function, PASP 55 mmHg, severe biatrial enlargement, 2 Mitraclips with only 1 attached to both leaflets, mild MR with mean gradient 3 mmHg, severe TR, small iatrogenic secundum ASD, dilated IVC.  - Echo (3/23): EF 60-65%, D-shaped septum, mild RV dysfunction with mildly enlarged RV, PASP 50 mmHg, severe  biatrial enlargement, mild MR s/p Mitraclip with mean gradient 4.5 mmHg, severe TR.  - tTEER as part of Triluminate study in 9/23.  - Post-tTEER echo in 9/23: EF 66%, severe RV dilation with low normal systolic function, 2 Mitraclips with mild-moderate MR, 2 Triclips with moderate TR, PASP 63 mmHg.  - Echo (10/23, [facility]): LVEF 62%, mild LVH, mild RV dilation with normal systolic function, mild MR with 2 mitraclips and mean gradient 4, moderate TR with 2 triclips, PASP 42 mmHg.   - Cardiac MRI (11/23, [facility]): LV EF 73%, RV EF 53%, mild-moderate TR with regurgitant fraction 20%.  - Echo (10/24): LV EF 75%, moderate-severe TR with 2 Triclips, moderate MR with 2 Mitraclips mean gradient 3 mmHg, severe biatrial enlargement, mild RV dilation with mildly decreased RV systolic function.   - Echo (10/25): EF 60% with mild RV dilation/normal RV systolic function, RVSP 50 mmHg, mod-severe residual TR s/p tTEER, mild MR s/p mTEER 7. Left renal mass: S/p left nephrectomy.  8. T-spine compression fracture.  9. Melanoma 10. H/o 6th nerve  palsy: Episode around 2016.  Recurrent episode in 10/24, resolved after a couple of weeks.  - MRI 11/24: No evidence for CVA.  - Carotid dopplers (10/24): No significant disease.   SH: Widower, occasional ETOH (not heavy), nonsmoker.  Retired clinical research associate.  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  [employer] graduate.   Family History  Problem Relation Age of Onset   Breast cancer Mother 72   Hypertension Father    Diabetes Neg Hx    Stroke Neg Hx    CAD Neg Hx    Colon cancer Neg Hx    Colon polyps Neg Hx    Esophageal cancer Neg Hx    Rectal cancer Neg Hx    Stomach cancer Neg Hx    ROS: All systems reviewed and negative except as per HPI.   Current Outpatient Medications  Medication Sig Dispense Refill   ALPRAZolam  (XANAX ) 0.25 MG tablet Take 1 tablet (0.25 mg total) by mouth every 6 (six) hours as needed for anxiety 30 tablet 0   apixaban  (ELIQUIS ) 5 MG TABS  tablet Take 1 tablet (5 mg total) by mouth 2 (two) times daily. 180 tablet 3   Apoaequorin (PREVAGEN EXTRA STRENGTH PO) Take 1 tablet by mouth daily.     Barberry-Oreg Grape-Goldenseal (BERBERINE COMPLEX PO) Take 2,400 mg by mouth daily.     Cholecalciferol 50 MCG (2000 UT) CAPS take one capsule Orally daily     Chromium 1000 MCG TABS Take 1,000 mcg by mouth daily. chromium picolinate     Coenzyme Q10 (CO Q10) 200 MG CAPS Take 200 mg by mouth daily.     empagliflozin  (JARDIANCE ) 10 MG TABS tablet Take 1 tablet (10 mg total) by mouth daily before breakfast. 30 tablet 11   ferrous gluconate  (FERGON) 324 MG tablet Take 324 mg by mouth 3 (three) times a week.     furosemide  (LASIX ) 20 MG tablet Take 1 tablet (20 mg total) by mouth daily. 90 tablet 3   losartan  (COZAAR ) 25 MG tablet Take 1 tablet (25 mg total) by mouth daily. 90 tablet 3   MAGNESIUM  GLYCINATE PO Take 525 mg by mouth in the morning and at bedtime.     Melatonin 10 MG TABS Take 10 mg by mouth at bedtime.     metoprolol  succinate (TOPROL  XL) 25 MG 24 hr tablet Take 1 tablet (25 mg total) by mouth 2 (two) times daily. 180 tablet 1   Misc Natural Products (ADVANCED JOINT RELIEF PO) Take 1 tablet by mouth 2 (two) times daily.     Multiple Vitamin (MULTIVITAMIN) capsule Take 1 capsule by mouth daily. Mega men 50 plus     Multiple Vitamins-Minerals (ZINC PO) Take by mouth daily.     NON FORMULARY Take 300 mg by mouth 2 (two) times daily. TRU vitamin  Nigen     omeprazole  (PRILOSEC) 20 MG capsule Take 1 capsule (20 mg total) by mouth daily. 90 capsule 3   OVER THE COUNTER MEDICATION Take 2 capsules by mouth daily. Prostacare     OVER THE COUNTER MEDICATION Take 1 capsule by mouth 2 (two) times daily. Uricare Himalaya     OVER THE COUNTER MEDICATION Take 1 tablet by mouth 2 (two) times daily. Liver care     OVER THE COUNTER MEDICATION Take 1 tablet by mouth 2 (two) times daily. Tri Flex     OVER THE COUNTER MEDICATION Take 1 capsule by mouth  2 (two) times daily. conjugated linoleic acid  OVER THE COUNTER MEDICATION Take 1 tablet by mouth daily in the afternoon. CDB Gummies     OVER THE COUNTER MEDICATION Take 1 capsule by mouth daily in the afternoon. Hemp oil gummies     OVER THE COUNTER MEDICATION Take 1 tablet by mouth daily. arthrozene     Polyethyl Glycol-Propyl Glycol (SYSTANE) 0.4-0.3 % SOLN 1 drop daily as needed (as needed for dry eye).     Sodium Hyaluronate, oral, (HYALURONIC ACID) 100 MG CAPS Take 100 mg by mouth 2 (two) times daily.     spironolactone  (ALDACTONE ) 25 MG tablet Take 1 tablet (25 mg total) by mouth daily. 90 tablet 3   tadalafil (CIALIS) 20 MG tablet TAKE ONE TABLET BY MOUTH DAILY AS NEEDED 30 tablet 11   ALPRAZolam  (XANAX ) 0.25 MG tablet Take 1 tablet (0.25 mg total) by mouth every 6 (six) hours as needed for anxiety. 30 tablet 0   No current facility-administered medications for this encounter.   Blood pressure 120/86, pulse 68, weight 73.9 kg (163 lb), SpO2 97%.  Filed Weights   04/24/24 1355  Weight: 73.9 kg (163 lb)   General: NAD Neck: No JVD, no thyromegaly or thyroid  nodule.  Lungs: Clear to auscultation bilaterally with normal respiratory effort. CV: Nondisplaced PMI.  Heart irregular S1/S2, no S3/S4, 1/6 HSM LLSB.  No peripheral edema.  No carotid bruit.  Normal pedal pulses.  Abdomen: Soft, nontender, no hepatosplenomegaly, no distention.  Skin: Intact without lesions or rashes.  Neurologic: Alert and oriented x 3.  Psych: Normal affect. Extremities: No clubbing or cyanosis.  HEENT: Normal.   Assessment/Plan: 1. Chronic diastolic CHF: Echo in 7/22 with EF 60-65%, normal RV, PASP 71 mmHg, severe biatrial enlargement, severe TR, probably moderate MR, IVC dilated.  In the summer of 2022, John Holder developed symptomatic CHF, this has improved with diuresis and sodium restriction.  TEE showed severe functional TR and severe primary mitral regurgitation with partial flail posterior leaflet (P3).   I suspect that CHF is due to chronic atrial fibrillation + valvular disease (MR, TR).  John Holder is now s/p Mitraclip, last echo in 12/22 showed EF 60-65%, D-shaped septum with moderate RV enlargement and mildly decreased systolic function, PASP 55 mmHg, severe biatrial enlargement, 2 Mitraclips with only 1 attached to both leaflets, mild MR with mean gradient 3 mmHg, severe TR, small iatrogenic secundum ASD, dilated IVC. Echo in 3/23 showed EF 60-65%, D-shaped septum, mild RV dysfunction with mildly enlarged RV, PASP 50 mmHg, severe biatrial enlargement, mild MR s/p Mitraclip with mean gradient 4.5 mmHg, severe TR.  Patient then had tTEER in 9/23. Post-op echo showed EF 66%, severe RV dilation with low normal systolic function, 2 Mitraclips with mild-moderate MR, 2 Triclips with moderate TR, PASP 63 mmHg.  Echo at [facility] in 10/23 showed LVEF 62%, mild LVH, mild RV dilation with normal systolic function, mild MR with 2 mitraclips and mean gradient 4, moderate TR with 2 triclips, PASP 42 mmHg.  Cardiac MRI in 11/23 showed LV EF 73%, RV EF 53%, mild-moderate TR with regurgitant fraction 20%. Echo in 10/24 showed LV EF 75%, moderate-severe TR with 2 Triclips, moderate MR with 2 Mitraclips mean gradient 3 mmHg, severe biatrial enlargement, mild RV dilation with mildly decreased RV systolic function.  Most recent echo 10/25 with EF 60% with mild RV dilation/normal RV systolic function, RVSP 50 mmHg, mod-severe residual TR s/p tTEER, mild MR s/p mTEER.  NYHA class I-II, John Holder is not volume overloaded on exam.  - Continue Lasix   20 mg daily.  BMET/BNP today.  - Continue empagliflozin  10 mg daily.    - Continue spironolactone  25 mg daily.  2. Pulmonary hypertension: Pulmonary venous hypertension by 9/22 RHC.  PASP was 63 on post-tTEER echo and 42 mmHg on 10/23 echo. Echo 10/25 with RVSP 50 mmHg. John Holder may have some residual PH due to pulmonary vascular remodeling in the setting of long-standing mitral valvular disease.  3. Atrial  fibrillation: This is chronic and likely permanent (present since 2017).  The atria are severely dilated on echo. I do not think that John Holder would be likely to hold NSR with DCCV, even with anti-arrhythmic.  - Continue Eliquis .  - Continue Toprol  XL to 25 mg bid 4. Valvular heart disease: John Holder is s/p Mitraclip with 3/23 echo showing mild MR and mild MS, but John Holder still had severe TR.  Symptomatically improved s/p Mitraclip.  With severe TR and RV changes already noted on echo (D-shaped septum, mild RV enlargement and hypokinesis), I was concerned that John Holder could develop progressive RV failure over time. John Holder then had tTEER with 2 Triclips in 9/23 as part of Triluminate trial.  Echo in 10/24 showed moderate MR s/p 2 Mitraclips and somewhat worse TR (moderate-severe) with 2 Triclips. Most recent echo 10/25 mild MR with mod-severe TR. TR remains improved from prior, will continue to medically manage.  5. Bradycardia/presyncope: Episode in 9/25 may have been vagally-mediated.  On Zio monitor, heart rate dropped to upper 30s transiently at night while sleeping.  No clinically significant bradycardia.   Followup in 6 months   I spent 21 minutes reviewing records, interviewing/examining patient, and managing orders.   Ezra Shuck, MD 04/24/2024

## 2024-04-24 NOTE — Patient Instructions (Signed)
 There has been no changes to your medications.  Labs done today, your results will be available in MyChart, we will contact you for abnormal readings.  Your physician recommends that you schedule a follow-up appointment in: 6 months ( June 2026) ** PLEASE CALL THE OFFICE IN APRIL TO ARRANGE YOUR FOLLOW UP APPOINTMENT.**  If you have any questions or concerns before your next appointment please send us  a message through [facility] or call our office at [phone].    TO LEAVE A MESSAGE FOR THE NURSE SELECT OPTION 2, PLEASE LEAVE A MESSAGE INCLUDING: YOUR NAME DATE OF BIRTH CALL BACK NUMBER REASON FOR CALL**this is important as we prioritize the call backs  YOU WILL RECEIVE A CALL BACK THE SAME DAY AS LONG AS YOU CALL BEFORE 4:00 PM  At the [facility], you and your health needs are our priority. As part of our continuing mission to provide you with exceptional heart care, we have created designated Provider Care Teams. These Care Teams include your primary Cardiologist (physician) and Advanced Practice Providers (APPs- Physician Assistants and Nurse Practitioners) who all work together to provide you with the care you need, when you need it.   You may see any of the following providers on your designated Care Team at your next follow up: Dr Toribio Fuel Dr Ezra Shuck Dr. Morene Brownie Greig Mosses, NP Caffie Shed, [place] [facility] [place], [place] Beckey Coe, NP Jordan Lee, NP Ellouise Class, NP Tinnie Redman, PharmD Jaun Bash, PharmD   Please be sure to bring in all your medications bottles to every appointment.    Thank you for choosing [facility]

## 2024-05-01 ENCOUNTER — Other Ambulatory Visit (HOSPITAL_COMMUNITY): Payer: Self-pay

## 2024-05-01 MED ORDER — OMEPRAZOLE 20 MG PO CPDR
20.0000 mg | DELAYED_RELEASE_CAPSULE | Freq: Every day | ORAL | 3 refills | Status: AC
  Filled 2024-05-01: qty 90, 90d supply, fill #0

## 2024-05-08 ENCOUNTER — Other Ambulatory Visit (HOSPITAL_COMMUNITY): Payer: Self-pay

## 2024-06-12 ENCOUNTER — Other Ambulatory Visit (HOSPITAL_COMMUNITY): Payer: Self-pay
